# Patient Record
Sex: Female | Born: 1955 | Race: White | Hispanic: No | Marital: Married | Smoking: Former smoker
Health system: Southern US, Community
[De-identification: ages and names within clinical notes are randomized; demographics above are authoritative.]

## PROBLEM LIST (undated history)

## (undated) DIAGNOSIS — K219 Gastro-esophageal reflux disease without esophagitis: Secondary | ICD-10-CM

## (undated) DIAGNOSIS — E785 Hyperlipidemia, unspecified: Secondary | ICD-10-CM

## (undated) DIAGNOSIS — M1990 Unspecified osteoarthritis, unspecified site: Secondary | ICD-10-CM

## (undated) DIAGNOSIS — I444 Left anterior fascicular block: Secondary | ICD-10-CM

## (undated) HISTORY — PX: CATARACT EXTRACTION, BILATERAL: SHX1313

## (undated) HISTORY — PX: KNEE SURGERY: SHX244

## (undated) HISTORY — PX: ABDOMINAL HYSTERECTOMY: SHX81

## (undated) HISTORY — DX: Hyperlipidemia, unspecified: E78.5

## (undated) HISTORY — DX: Unspecified osteoarthritis, unspecified site: M19.90

---

## 2012-03-15 HISTORY — PX: COLONOSCOPY WITH ESOPHAGOGASTRODUODENOSCOPY (EGD): SHX5779

## 2013-10-04 ENCOUNTER — Other Ambulatory Visit: Payer: Self-pay | Admitting: Obstetrics & Gynecology

## 2013-10-04 DIAGNOSIS — R928 Other abnormal and inconclusive findings on diagnostic imaging of breast: Secondary | ICD-10-CM

## 2013-10-11 ENCOUNTER — Ambulatory Visit
Admission: RE | Admit: 2013-10-11 | Discharge: 2013-10-11 | Disposition: A | Discharge status: Home / Self Care | Payer: 59 | Source: Ambulatory Visit | Attending: Obstetrics & Gynecology | Admitting: Obstetrics & Gynecology

## 2013-10-11 ENCOUNTER — Encounter (INDEPENDENT_AMBULATORY_CARE_PROVIDER_SITE_OTHER): Payer: Self-pay

## 2013-10-11 DIAGNOSIS — R928 Other abnormal and inconclusive findings on diagnostic imaging of breast: Secondary | ICD-10-CM

## 2014-04-08 ENCOUNTER — Other Ambulatory Visit: Payer: Self-pay | Admitting: Obstetrics & Gynecology

## 2014-04-08 DIAGNOSIS — N6489 Other specified disorders of breast: Secondary | ICD-10-CM

## 2014-04-18 ENCOUNTER — Ambulatory Visit
Admission: RE | Admit: 2014-04-18 | Discharge: 2014-04-18 | Disposition: A | Discharge status: Home / Self Care | Payer: 59 | Source: Ambulatory Visit | Attending: Obstetrics & Gynecology | Admitting: Obstetrics & Gynecology

## 2014-04-18 ENCOUNTER — Other Ambulatory Visit: Payer: Self-pay | Admitting: Obstetrics & Gynecology

## 2014-04-18 DIAGNOSIS — N6489 Other specified disorders of breast: Secondary | ICD-10-CM

## 2014-11-14 ENCOUNTER — Other Ambulatory Visit: Payer: Self-pay | Admitting: Obstetrics & Gynecology

## 2014-11-14 ENCOUNTER — Other Ambulatory Visit: Payer: Self-pay | Admitting: Gastroenterology

## 2014-11-14 DIAGNOSIS — D242 Benign neoplasm of left breast: Secondary | ICD-10-CM

## 2014-11-21 ENCOUNTER — Other Ambulatory Visit: Payer: 59

## 2014-11-22 ENCOUNTER — Ambulatory Visit
Admission: RE | Admit: 2014-11-22 | Discharge: 2014-11-22 | Disposition: A | Discharge status: Home / Self Care | Payer: 59 | Source: Ambulatory Visit | Attending: Obstetrics & Gynecology | Admitting: Obstetrics & Gynecology

## 2014-11-22 ENCOUNTER — Other Ambulatory Visit: Payer: 59

## 2014-11-22 DIAGNOSIS — D242 Benign neoplasm of left breast: Secondary | ICD-10-CM

## 2015-12-19 ENCOUNTER — Encounter: Payer: Self-pay | Admitting: Gastroenterology

## 2015-12-24 ENCOUNTER — Other Ambulatory Visit (HOSPITAL_COMMUNITY): Payer: Self-pay | Admitting: Internal Medicine

## 2015-12-24 ENCOUNTER — Other Ambulatory Visit: Payer: Self-pay | Admitting: Obstetrics & Gynecology

## 2015-12-24 DIAGNOSIS — Z780 Asymptomatic menopausal state: Secondary | ICD-10-CM

## 2015-12-24 DIAGNOSIS — N6320 Unspecified lump in the left breast, unspecified quadrant: Secondary | ICD-10-CM

## 2015-12-31 ENCOUNTER — Ambulatory Visit
Admission: RE | Admit: 2015-12-31 | Discharge: 2015-12-31 | Disposition: A | Discharge status: Home / Self Care | Payer: 59 | Source: Ambulatory Visit | Attending: Obstetrics & Gynecology | Admitting: Obstetrics & Gynecology

## 2015-12-31 DIAGNOSIS — N6320 Unspecified lump in the left breast, unspecified quadrant: Secondary | ICD-10-CM

## 2016-01-02 ENCOUNTER — Encounter: Payer: Self-pay | Admitting: Gastroenterology

## 2016-01-05 ENCOUNTER — Ambulatory Visit (HOSPITAL_COMMUNITY)
Admission: RE | Admit: 2016-01-05 | Discharge: 2016-01-05 | Disposition: A | Discharge status: Home / Self Care | Payer: 59 | Source: Ambulatory Visit | Attending: Internal Medicine | Admitting: Internal Medicine

## 2016-01-05 DIAGNOSIS — Z780 Asymptomatic menopausal state: Secondary | ICD-10-CM | POA: Diagnosis not present

## 2016-01-05 DIAGNOSIS — F17200 Nicotine dependence, unspecified, uncomplicated: Secondary | ICD-10-CM | POA: Insufficient documentation

## 2016-01-09 ENCOUNTER — Telehealth: Payer: Self-pay | Admitting: Gastroenterology

## 2016-01-09 ENCOUNTER — Encounter: Payer: Self-pay | Admitting: Gastroenterology

## 2016-01-09 ENCOUNTER — Ambulatory Visit: Payer: 59 | Admitting: Gastroenterology

## 2016-01-09 NOTE — Telephone Encounter (Signed)
PT WAS A NO SHOW AND LETTER SENT  °

## 2016-01-12 ENCOUNTER — Ambulatory Visit: Payer: 59 | Admitting: Nurse Practitioner

## 2016-01-27 ENCOUNTER — Encounter: Payer: Self-pay | Admitting: Nurse Practitioner

## 2016-01-27 ENCOUNTER — Ambulatory Visit (INDEPENDENT_AMBULATORY_CARE_PROVIDER_SITE_OTHER): Payer: 59 | Admitting: Nurse Practitioner

## 2016-01-27 DIAGNOSIS — K5900 Constipation, unspecified: Secondary | ICD-10-CM | POA: Insufficient documentation

## 2016-01-27 DIAGNOSIS — Z86010 Personal history of colonic polyps: Secondary | ICD-10-CM

## 2016-01-27 NOTE — Assessment & Plan Note (Signed)
The patient has a history of "looped colon." Last colonoscopy 3 years ago with recommended 3 year repeat due to 2 tubular adenomas and predisposition to constipation related to looped colon. At this point she is generally asymptomatic from a GI standpoint. Has a regimen of Metamucil twice a day, milk of magnesia once a week. Also takes an over-the-counter vegetarian supplement for fiber. This point we will proceed with recommended surveillance colonoscopy.  Proceed with colonoscopy with Dr. Oneida Alar in the near future. The risks, benefits, and alternatives have been discussed in detail with the patient. They state understanding and desire to proceed.   The patient is not on any anticoagulants, anxiolytics, chronic pain medications, or antidepressants. Once a week she'll have 2-3 alcoholic drinks. She admits daily marijuana use. We will add 12.5 mg preprocedure Phenergan to promote adequate sedation.

## 2016-01-27 NOTE — Progress Notes (Signed)
CC'D TO PCP °

## 2016-01-27 NOTE — Assessment & Plan Note (Signed)
The patient's last colonoscopy found to tubular adenomas. Recommended 3 year repeat. Generally asymptomatic from a GI standpoint other than some constipation as noted below. We will proceed with recommended colonoscopy as noted below.

## 2016-01-27 NOTE — Progress Notes (Signed)
Primary Care Physician:  Wende Neighbors, MD Primary Gastroenterologist:  Dr. Oneida Alar  Chief Complaint  Patient presents with  . Colonoscopy    HPI:   Paula Sutton is a 60 y.o. female who presents on referral from primary care to establish care with a local gastroenterologist. She has a history of loop: And requires a strict regimen of stool softeners to keep bowels regular. PCP notes reviewed. Previous specialist notes reviewed from [place]. Last salt gastroenterology 01/18/2013 which notes follow-up after EGD and colonoscopy on 01/01/2013 noted to have distal esophagitis, 2 colon polyps, mild diverticulosis, internal hemorrhoids, severe looping and congested sigmoid mucosa. Biopsies suggestive of reflux esophagitis negative for Barrett's, mild gastritis negative for H. pylori, normal duodenal biopsies, 2 tubular adenomas and focal acute nonspecific colitis. Recommended repeat colonoscopy in 3 years.  Today she states she's had looping colon likely since abdominal surgery for hysterectomy with a cysts noted around her colon (this has been 15 years ago). Metamucil bid and Pepcid prn controls her symptoms well. She has also decreased her caffeine to one glass of tea a day. Has occasional epigastric abdominal pain which tends to resolve with Pepcid. Occasional/rare lower abdominal pain which improve with Milk of Mag to "flush out." Takes a supplement/vegitarian pill to help soften stools. Denies N/V, hematochezia, melena, unintentional weight loss, fever, chills. Denies chest pain, dyspnea, dizziness, lightheadedness, syncope, near syncope. Denies any other upper or lower GI symptoms.  Past Medical History:  Diagnosis Date  . Arthritis    knee    Past Surgical History:  Procedure Laterality Date  . ABDOMINAL HYSTERECTOMY    . COLONOSCOPY     3 years ago  . KNEE SURGERY Right     Current Outpatient Prescriptions  Medication Sig Dispense Refill  . famotidine (PEPCID) 40 MG tablet Take 40 mg  by mouth daily as needed for heartburn or indigestion.    . magnesium hydroxide (MILK OF MAGNESIA) 400 MG/5ML suspension Take 30 mLs by mouth once a week.    . Psyllium (METAMUCIL FIBER PO) Take 5 mLs by mouth 2 (two) times daily.    Marland Kitchen estradiol (CLIMARA - DOSED IN MG/24 HR) 0.1 mg/24hr patch      No current facility-administered medications for this visit.     Allergies as of 01/27/2016 - Review Complete 01/27/2016  Allergen Reaction Noted  . Sulfa antibiotics  01/27/2016    Family History  Problem Relation Age of Onset  . Colon cancer Neg Hx     Social History   Social History  . Marital status: Married    Spouse name: N/A  . Number of children: N/A  . Years of education: N/A   Occupational History  . Not on file.   Social History Main Topics  . Smoking status: Former Smoker    Types: Cigarettes    Quit date: 01/27/1971  . Smokeless tobacco: Never Used  . Alcohol use Yes     Comment: Occasional: once per week, 2-3 per sitting  . Drug use:     Frequency: 7.0 times per week    Types: Marijuana  . Sexual activity: Not on file   Other Topics Concern  . Not on file   Social History Narrative  . No narrative on file    Review of Systems: Complete ROS negative except as per HPI.    Physical Exam: BP 133/77   Pulse 61   Temp 97.6 F (36.4 C) (Oral)   Ht 5\' 3"  (1.6 m)  Wt 176 lb (79.8 kg)   BMI 31.18 kg/m  General:   Alert and oriented. Pleasant and cooperative. Well-nourished and well-developed.  Head:  Normocephalic and atraumatic. Eyes:  Without icterus, sclera clear and conjunctiva pink.  Ears:  Normal auditory acuity. Cardiovascular:  S1, S2 present without murmurs appreciated. Extremities without clubbing or edema. Respiratory:  Clear to auscultation bilaterally. No wheezes, rales, or rhonchi. No distress.  Gastrointestinal:  +BS, rounded but soft, non-tender and non-distended. No HSM noted. No guarding or rebound. No masses appreciated.  Rectal:   Deferred  Musculoskalatal:  Symmetrical without gross deformities. Knee brace noted right knee. Neurologic:  Alert and oriented x4;  grossly normal neurologically. Psych:  Alert and cooperative. Normal mood and affect. Heme/Lymph/Immune: No excessive bruising noted.    01/27/2016 8:37 AM   Disclaimer: This note was dictated with voice recognition software. Similar sounding words can inadvertently be transcribed and may not be corrected upon review.

## 2016-01-27 NOTE — Patient Instructions (Signed)
1. We will schedule a tentative date for your procedure. 2. We will have you return to the office within 30 days of your procedure to update you information and finalize scheduling. 3. Call with any worsening symptoms or problems.

## 2016-03-02 ENCOUNTER — Ambulatory Visit: Payer: 59 | Admitting: Nurse Practitioner

## 2016-03-12 ENCOUNTER — Encounter: Payer: Self-pay | Admitting: Gastroenterology

## 2016-04-30 DIAGNOSIS — M546 Pain in thoracic spine: Secondary | ICD-10-CM | POA: Diagnosis not present

## 2016-04-30 DIAGNOSIS — S134XXA Sprain of ligaments of cervical spine, initial encounter: Secondary | ICD-10-CM | POA: Diagnosis not present

## 2016-04-30 DIAGNOSIS — M531 Cervicobrachial syndrome: Secondary | ICD-10-CM | POA: Diagnosis not present

## 2016-05-26 DIAGNOSIS — M546 Pain in thoracic spine: Secondary | ICD-10-CM | POA: Diagnosis not present

## 2016-05-26 DIAGNOSIS — S134XXA Sprain of ligaments of cervical spine, initial encounter: Secondary | ICD-10-CM | POA: Diagnosis not present

## 2016-05-26 DIAGNOSIS — M531 Cervicobrachial syndrome: Secondary | ICD-10-CM | POA: Diagnosis not present

## 2016-07-02 DIAGNOSIS — H5310 Unspecified subjective visual disturbances: Secondary | ICD-10-CM | POA: Diagnosis not present

## 2016-07-05 ENCOUNTER — Ambulatory Visit (HOSPITAL_COMMUNITY)
Admission: RE | Admit: 2016-07-05 | Discharge: 2016-07-05 | Disposition: A | Discharge status: Home / Self Care | Payer: 59 | Source: Ambulatory Visit | Attending: Internal Medicine | Admitting: Internal Medicine

## 2016-07-05 ENCOUNTER — Other Ambulatory Visit (HOSPITAL_COMMUNITY): Payer: Self-pay | Admitting: Internal Medicine

## 2016-07-05 DIAGNOSIS — M79602 Pain in left arm: Secondary | ICD-10-CM

## 2016-07-05 DIAGNOSIS — R002 Palpitations: Secondary | ICD-10-CM

## 2016-07-05 DIAGNOSIS — R42 Dizziness and giddiness: Secondary | ICD-10-CM | POA: Diagnosis not present

## 2016-07-06 ENCOUNTER — Other Ambulatory Visit (HOSPITAL_COMMUNITY): Payer: Self-pay | Admitting: Internal Medicine

## 2016-07-06 DIAGNOSIS — M79602 Pain in left arm: Secondary | ICD-10-CM

## 2016-07-06 DIAGNOSIS — R002 Palpitations: Secondary | ICD-10-CM

## 2016-07-22 DIAGNOSIS — S134XXA Sprain of ligaments of cervical spine, initial encounter: Secondary | ICD-10-CM | POA: Diagnosis not present

## 2016-07-22 DIAGNOSIS — M531 Cervicobrachial syndrome: Secondary | ICD-10-CM | POA: Diagnosis not present

## 2016-07-22 DIAGNOSIS — M546 Pain in thoracic spine: Secondary | ICD-10-CM | POA: Diagnosis not present

## 2016-07-23 DIAGNOSIS — M25562 Pain in left knee: Secondary | ICD-10-CM | POA: Diagnosis not present

## 2016-09-08 DIAGNOSIS — M546 Pain in thoracic spine: Secondary | ICD-10-CM | POA: Diagnosis not present

## 2016-09-08 DIAGNOSIS — M531 Cervicobrachial syndrome: Secondary | ICD-10-CM | POA: Diagnosis not present

## 2016-09-08 DIAGNOSIS — S134XXA Sprain of ligaments of cervical spine, initial encounter: Secondary | ICD-10-CM | POA: Diagnosis not present

## 2016-12-01 DIAGNOSIS — M531 Cervicobrachial syndrome: Secondary | ICD-10-CM | POA: Diagnosis not present

## 2016-12-01 DIAGNOSIS — M546 Pain in thoracic spine: Secondary | ICD-10-CM | POA: Diagnosis not present

## 2016-12-01 DIAGNOSIS — S134XXA Sprain of ligaments of cervical spine, initial encounter: Secondary | ICD-10-CM | POA: Diagnosis not present

## 2016-12-09 ENCOUNTER — Other Ambulatory Visit: Payer: Self-pay | Admitting: Obstetrics & Gynecology

## 2016-12-09 DIAGNOSIS — Z1231 Encounter for screening mammogram for malignant neoplasm of breast: Secondary | ICD-10-CM

## 2016-12-27 DIAGNOSIS — E782 Mixed hyperlipidemia: Secondary | ICD-10-CM | POA: Diagnosis not present

## 2016-12-29 DIAGNOSIS — Z0000 Encounter for general adult medical examination without abnormal findings: Secondary | ICD-10-CM | POA: Diagnosis not present

## 2016-12-30 DIAGNOSIS — M546 Pain in thoracic spine: Secondary | ICD-10-CM | POA: Diagnosis not present

## 2016-12-30 DIAGNOSIS — M531 Cervicobrachial syndrome: Secondary | ICD-10-CM | POA: Diagnosis not present

## 2016-12-30 DIAGNOSIS — S134XXA Sprain of ligaments of cervical spine, initial encounter: Secondary | ICD-10-CM | POA: Diagnosis not present

## 2016-12-31 ENCOUNTER — Ambulatory Visit
Admission: RE | Admit: 2016-12-31 | Discharge: 2016-12-31 | Disposition: A | Discharge status: Home / Self Care | Payer: 59 | Source: Ambulatory Visit | Attending: Obstetrics & Gynecology | Admitting: Obstetrics & Gynecology

## 2016-12-31 DIAGNOSIS — Z1231 Encounter for screening mammogram for malignant neoplasm of breast: Secondary | ICD-10-CM | POA: Diagnosis not present

## 2017-01-03 ENCOUNTER — Other Ambulatory Visit: Payer: Self-pay | Admitting: Obstetrics & Gynecology

## 2017-01-03 DIAGNOSIS — R928 Other abnormal and inconclusive findings on diagnostic imaging of breast: Secondary | ICD-10-CM

## 2017-01-31 DIAGNOSIS — M531 Cervicobrachial syndrome: Secondary | ICD-10-CM | POA: Diagnosis not present

## 2017-01-31 DIAGNOSIS — S134XXA Sprain of ligaments of cervical spine, initial encounter: Secondary | ICD-10-CM | POA: Diagnosis not present

## 2017-01-31 DIAGNOSIS — M546 Pain in thoracic spine: Secondary | ICD-10-CM | POA: Diagnosis not present

## 2017-02-01 ENCOUNTER — Ambulatory Visit
Admission: RE | Admit: 2017-02-01 | Discharge: 2017-02-01 | Disposition: A | Discharge status: Home / Self Care | Payer: 59 | Source: Ambulatory Visit | Attending: Obstetrics & Gynecology | Admitting: Obstetrics & Gynecology

## 2017-02-01 DIAGNOSIS — R928 Other abnormal and inconclusive findings on diagnostic imaging of breast: Secondary | ICD-10-CM

## 2017-02-01 DIAGNOSIS — N6489 Other specified disorders of breast: Secondary | ICD-10-CM | POA: Diagnosis not present

## 2017-02-01 DIAGNOSIS — R922 Inconclusive mammogram: Secondary | ICD-10-CM | POA: Diagnosis not present

## 2017-02-07 ENCOUNTER — Other Ambulatory Visit: Payer: 59

## 2017-02-24 DIAGNOSIS — S134XXA Sprain of ligaments of cervical spine, initial encounter: Secondary | ICD-10-CM | POA: Diagnosis not present

## 2017-02-24 DIAGNOSIS — M531 Cervicobrachial syndrome: Secondary | ICD-10-CM | POA: Diagnosis not present

## 2017-02-24 DIAGNOSIS — M546 Pain in thoracic spine: Secondary | ICD-10-CM | POA: Diagnosis not present

## 2017-02-25 DIAGNOSIS — S134XXA Sprain of ligaments of cervical spine, initial encounter: Secondary | ICD-10-CM | POA: Diagnosis not present

## 2017-02-25 DIAGNOSIS — M531 Cervicobrachial syndrome: Secondary | ICD-10-CM | POA: Diagnosis not present

## 2017-02-25 DIAGNOSIS — M546 Pain in thoracic spine: Secondary | ICD-10-CM | POA: Diagnosis not present

## 2017-03-03 DIAGNOSIS — M546 Pain in thoracic spine: Secondary | ICD-10-CM | POA: Diagnosis not present

## 2017-03-03 DIAGNOSIS — S134XXA Sprain of ligaments of cervical spine, initial encounter: Secondary | ICD-10-CM | POA: Diagnosis not present

## 2017-03-03 DIAGNOSIS — M531 Cervicobrachial syndrome: Secondary | ICD-10-CM | POA: Diagnosis not present

## 2017-03-10 DIAGNOSIS — M531 Cervicobrachial syndrome: Secondary | ICD-10-CM | POA: Diagnosis not present

## 2017-03-10 DIAGNOSIS — M546 Pain in thoracic spine: Secondary | ICD-10-CM | POA: Diagnosis not present

## 2017-03-10 DIAGNOSIS — S134XXA Sprain of ligaments of cervical spine, initial encounter: Secondary | ICD-10-CM | POA: Diagnosis not present

## 2017-03-17 DIAGNOSIS — M546 Pain in thoracic spine: Secondary | ICD-10-CM | POA: Diagnosis not present

## 2017-03-17 DIAGNOSIS — S134XXA Sprain of ligaments of cervical spine, initial encounter: Secondary | ICD-10-CM | POA: Diagnosis not present

## 2017-03-17 DIAGNOSIS — M531 Cervicobrachial syndrome: Secondary | ICD-10-CM | POA: Diagnosis not present

## 2017-03-23 DIAGNOSIS — H5201 Hypermetropia, right eye: Secondary | ICD-10-CM | POA: Diagnosis not present

## 2017-03-23 DIAGNOSIS — H2511 Age-related nuclear cataract, right eye: Secondary | ICD-10-CM | POA: Diagnosis not present

## 2017-03-23 DIAGNOSIS — Z961 Presence of intraocular lens: Secondary | ICD-10-CM | POA: Diagnosis not present

## 2017-03-23 DIAGNOSIS — H52203 Unspecified astigmatism, bilateral: Secondary | ICD-10-CM | POA: Diagnosis not present

## 2017-03-30 DIAGNOSIS — M546 Pain in thoracic spine: Secondary | ICD-10-CM | POA: Diagnosis not present

## 2017-03-30 DIAGNOSIS — S134XXA Sprain of ligaments of cervical spine, initial encounter: Secondary | ICD-10-CM | POA: Diagnosis not present

## 2017-03-30 DIAGNOSIS — M531 Cervicobrachial syndrome: Secondary | ICD-10-CM | POA: Diagnosis not present

## 2017-04-14 DIAGNOSIS — S134XXA Sprain of ligaments of cervical spine, initial encounter: Secondary | ICD-10-CM | POA: Diagnosis not present

## 2017-04-14 DIAGNOSIS — M531 Cervicobrachial syndrome: Secondary | ICD-10-CM | POA: Diagnosis not present

## 2017-04-14 DIAGNOSIS — M546 Pain in thoracic spine: Secondary | ICD-10-CM | POA: Diagnosis not present

## 2017-04-27 ENCOUNTER — Ambulatory Visit: Payer: 59 | Admitting: Nurse Practitioner

## 2017-05-16 DIAGNOSIS — S134XXA Sprain of ligaments of cervical spine, initial encounter: Secondary | ICD-10-CM | POA: Diagnosis not present

## 2017-05-16 DIAGNOSIS — M531 Cervicobrachial syndrome: Secondary | ICD-10-CM | POA: Diagnosis not present

## 2017-05-16 DIAGNOSIS — M546 Pain in thoracic spine: Secondary | ICD-10-CM | POA: Diagnosis not present

## 2017-05-25 DIAGNOSIS — M25562 Pain in left knee: Secondary | ICD-10-CM | POA: Diagnosis not present

## 2017-05-25 DIAGNOSIS — Z96651 Presence of right artificial knee joint: Secondary | ICD-10-CM | POA: Diagnosis not present

## 2017-06-03 ENCOUNTER — Other Ambulatory Visit: Payer: Self-pay

## 2017-06-03 ENCOUNTER — Encounter: Payer: Self-pay | Admitting: Gastroenterology

## 2017-06-03 ENCOUNTER — Ambulatory Visit (INDEPENDENT_AMBULATORY_CARE_PROVIDER_SITE_OTHER): Payer: 59 | Admitting: Gastroenterology

## 2017-06-03 VITALS — BP 119/79 | HR 52 | Temp 96.5°F | Ht 62.0 in | Wt 176.8 lb

## 2017-06-03 DIAGNOSIS — Z86010 Personal history of colonic polyps: Secondary | ICD-10-CM

## 2017-06-03 DIAGNOSIS — R1013 Epigastric pain: Secondary | ICD-10-CM | POA: Diagnosis not present

## 2017-06-03 DIAGNOSIS — K5900 Constipation, unspecified: Secondary | ICD-10-CM | POA: Diagnosis not present

## 2017-06-03 DIAGNOSIS — Z860101 Personal history of adenomatous and serrated colon polyps: Secondary | ICD-10-CM

## 2017-06-03 MED ORDER — NA SULFATE-K SULFATE-MG SULF 17.5-3.13-1.6 GM/177ML PO SOLN: 1.0000 | ORAL | 0 refills | Status: DC

## 2017-06-03 MED ORDER — LINACLOTIDE 145 MCG PO CAPS: 145.0000 ug | ORAL_CAPSULE | Freq: Every day | ORAL | 5 refills | Status: DC

## 2017-06-03 NOTE — Assessment & Plan Note (Signed)
Patient also has a history of reflux esophagitis.  Currently treating typical heartburn symptoms adequately with digestive enzymes, peppermint oil.  She complains of epigastric pain, tender on exam.  This is been chronic in nature, does not seem to be improving.  Is requesting upper endoscopy at time of colonoscopy which was read.  Plan for deep sedation with propofol.  I have discussed the risks, alternatives, benefits with regards to but not limited to the risk of reaction to medication, bleeding, infection, perforation and the patient is agreeable to proceed. Written consent to be obtained.

## 2017-06-03 NOTE — Progress Notes (Addendum)
Primary Care Physician:  Celene Squibb, MD  Primary Gastroenterologist:  Barney Drain, MD REVIEWED-NO ADDITIONAL RECOMMENDATIONS.  Chief Complaint  Patient presents with  . Colonoscopy    consult, past due    HPI:  Paula Sutton is a 62 y.o. female here to rescheduled her colonoscopy and discuss possible upper endoscopy.  She was seen back in 2017 and scheduled for colonoscopy but patient had to cancel.  EGD and colonoscopy at [facility] on 01/01/2013 noted to have distal esophagitis, 2 colon polyps, mild diverticulosis, internal hemorrhoids, severe looping and congested sigmoid mucosa. Biopsies suggestive of reflux esophagitis negative for Barrett's, mild gastritis negative for H. pylori, normal duodenal biopsies, 2 tubular adenomas and focal acute nonspecific colitis. Recommended repeat colonoscopy in 3 years.  She is no longer on PPI.  She states she takes digestive enzymes, peppermint which seems to help with her reflux and gas.  She continues to have some epigastric discomfort especially with palpation but also related to meals.  Her chiropractor told her she had a hiatal hernia.  He has been making adjustments.  She has to stay on top of her bowel regimen to continue to have regular BMs.  She takes MiraLAX every day and milk of magnesia a couple of times per week.  Denies melena or rectal bleeding.  She basically has a bowel movement every day, some days more productive than others.  She denies dysphagia.  No vomiting.  No unintentional weight loss.   Current Outpatient Medications  Medication Sig Dispense Refill  . DIGESTIVE ENZYMES PO Take by mouth daily.    Marland Kitchen estradiol (CLIMARA - DOSED IN MG/24 HR) 0.1 mg/24hr patch     . magnesium hydroxide (MILK OF MAGNESIA) 400 MG/5ML suspension Take 30 mLs by mouth once a week.    . polyethylene glycol (MIRALAX / GLYCOLAX) packet Take 17 g by mouth daily.     No current facility-administered medications for this visit.     Allergies  as of 06/03/2017 - Review Complete 06/03/2017  Allergen Reaction Noted  . Sulfa antibiotics  01/27/2016    Past Medical History:  Diagnosis Date  . Arthritis    knee    Past Surgical History:  Procedure Laterality Date  . ABDOMINAL HYSTERECTOMY    . COLONOSCOPY WITH ESOPHAGOGASTRODUODENOSCOPY (EGD)  2014   [place] Right     Family History  Problem Relation Age of Onset  . Colon cancer Neg Hx   . Breast cancer Neg Hx     Social History   Socioeconomic History  . Marital status: Married    Spouse name: Not on file  . Number of children: Not on file  . Years of education: Not on file  . Highest education level: Not on file  Occupational History  . Not on file  Social Needs  . Financial resource strain: Not on file  . Food insecurity:    Worry: Not on file    Inability: Not on file  . Transportation needs:    Medical: Not on file    Non-medical: Not on file  Tobacco Use  . Smoking status: Former Smoker    Types: Cigarettes    Last attempt to quit: 01/27/1971    Years since quitting: 46.3  . Smokeless tobacco: Never Used  Substance and Sexual Activity  . Alcohol use: Yes    Comment: Occasional: 1-2 times/month  . Drug use: Yes    Frequency: 7.0 times per week  Types: Marijuana  . Sexual activity: Not on file  Lifestyle  . Physical activity:    Days per week: Not on file    Minutes per session: Not on file  . Stress: Not on file  Relationships  . Social connections:    Talks on phone: Not on file    Gets together: Not on file    Attends religious service: Not on file    Active member of club or organization: Not on file    Attends meetings of clubs or organizations: Not on file    Relationship status: Not on file  . Intimate partner violence:    Fear of current or ex partner: Not on file    Emotionally abused: Not on file    Physically abused: Not on file    Forced sexual activity: Not on file  Other Topics Concern   . Not on file  Social History Narrative  . Not on file      ROS:  General: Negative for anorexia, weight loss, fever, chills, fatigue, weakness. Eyes: Negative for vision changes.  ENT: Negative for hoarseness, difficulty swallowing , nasal congestion. CV: Negative for chest pain, angina, palpitations, dyspnea on exertion, peripheral edema.  Respiratory: Negative for dyspnea at rest, dyspnea on exertion, cough, sputum, wheezing.  GI: See history of present illness. GU:  Negative for dysuria, hematuria, urinary incontinence, urinary frequency, nocturnal urination.  MS: Negative for joint pain, low back pain.  Derm: Negative for rash or itching.  Neuro: Negative for weakness, abnormal sensation, seizure, frequent headaches, memory loss, confusion.  Psych: Negative for anxiety, depression, suicidal ideation, hallucinations.  Endo: Negative for unusual weight change.  Heme: Negative for bruising or bleeding. Allergy: Negative for rash or hives.    Physical Examination:  BP 119/79   Pulse (!) 52   Temp (!) 96.5 F (35.8 C) (Oral)   Ht 5\' 2"  (1.575 m)   Wt 176 lb 12.8 oz (80.2 kg)   BMI 32.34 kg/m    General: Well-nourished, well-developed in no acute distress.  Head: Normocephalic, atraumatic.   Eyes: Conjunctiva pink, no icterus. Mouth: Oropharyngeal mucosa moist and pink , no lesions erythema or exudate. Neck: Supple without thyromegaly, masses, or lymphadenopathy.  Lungs: Clear to auscultation bilaterally.  Heart: Regular rate and rhythm, no murmurs rubs or gallops.  Abdomen: Bowel sounds are normal, nontender, nondistended, no hepatosplenomegaly or masses, no abdominal bruits or    hernia , no rebound or guarding.   Rectal: not performed Extremities: No lower extremity edema. No clubbing or deformities.  Neuro: Alert and oriented x 4 , grossly normal neurologically.  Skin: Warm and dry, no rash or jaundice.   Psych: Alert and cooperative, normal mood and affect.    Imaging Studies: No results found.

## 2017-06-03 NOTE — Assessment & Plan Note (Signed)
Patient presents to reschedule her colonoscopy for history of tubular adenomas.  She was due in 2017.  According to the records she has a difficult to examine colon with looping.  Day-to-day she seems to manage her constipation pretty good however given her last colonoscopy, her prep was somewhat suboptimal, will switch her over to Linzess 115mcg daily to improve bowel function especially prior to colonoscopy. Plan for deep sedation with propofol.  I have discussed the risks, alternatives, benefits with regards to but not limited to the risk of reaction to medication, bleeding, infection, perforation and the patient is agreeable to proceed. Written consent to be obtained.

## 2017-06-03 NOTE — Patient Instructions (Signed)
1. Colonoscopy and upper endoscopy as scheduled. See separate instructions.  2. I have sent in RX for Linzess and provided samples today. You can take once daily on empty stomach for constipation. Please make sure your bowels are moving well especially the week before your procedures.

## 2017-06-06 ENCOUNTER — Telehealth: Payer: Self-pay

## 2017-06-06 NOTE — Progress Notes (Signed)
cc'ed to pcp °

## 2017-06-06 NOTE — Telephone Encounter (Signed)
PA info for TCS/EGD submitted via [facility] website. Notification/prior authorization reference number is [id number].

## 2017-06-06 NOTE — Telephone Encounter (Signed)
Called and informed pt of pre-op appt 08/04/17 at 9:00am. Letter mailed.

## 2017-06-08 NOTE — Telephone Encounter (Signed)
TCS/EGD approved.

## 2017-06-30 DIAGNOSIS — S134XXA Sprain of ligaments of cervical spine, initial encounter: Secondary | ICD-10-CM | POA: Diagnosis not present

## 2017-06-30 DIAGNOSIS — M546 Pain in thoracic spine: Secondary | ICD-10-CM | POA: Diagnosis not present

## 2017-06-30 DIAGNOSIS — M25562 Pain in left knee: Secondary | ICD-10-CM | POA: Diagnosis not present

## 2017-06-30 DIAGNOSIS — M25561 Pain in right knee: Secondary | ICD-10-CM | POA: Diagnosis not present

## 2017-06-30 DIAGNOSIS — M1712 Unilateral primary osteoarthritis, left knee: Secondary | ICD-10-CM | POA: Diagnosis not present

## 2017-06-30 DIAGNOSIS — M531 Cervicobrachial syndrome: Secondary | ICD-10-CM | POA: Diagnosis not present

## 2017-07-01 DIAGNOSIS — E782 Mixed hyperlipidemia: Secondary | ICD-10-CM | POA: Diagnosis not present

## 2017-07-11 DIAGNOSIS — K5900 Constipation, unspecified: Secondary | ICD-10-CM | POA: Diagnosis not present

## 2017-07-11 DIAGNOSIS — N951 Menopausal and female climacteric states: Secondary | ICD-10-CM | POA: Diagnosis not present

## 2017-07-11 DIAGNOSIS — E782 Mixed hyperlipidemia: Secondary | ICD-10-CM | POA: Diagnosis not present

## 2017-07-28 DIAGNOSIS — S134XXA Sprain of ligaments of cervical spine, initial encounter: Secondary | ICD-10-CM | POA: Diagnosis not present

## 2017-07-28 DIAGNOSIS — M531 Cervicobrachial syndrome: Secondary | ICD-10-CM | POA: Diagnosis not present

## 2017-07-28 DIAGNOSIS — M546 Pain in thoracic spine: Secondary | ICD-10-CM | POA: Diagnosis not present

## 2017-08-02 ENCOUNTER — Other Ambulatory Visit (HOSPITAL_COMMUNITY): Payer: 59

## 2017-08-02 NOTE — Patient Instructions (Signed)
Your procedure is scheduled on: 08/09/2017  Report to [facility] at 8:00    AM.  Call this number if you have problems the morning of surgery: [phone]   Remember:              Follow Directions on the letter you received from Your Physician's office regarding the Bowel Prep  :  Take these medicines the morning of surgery with A SIP OF WATER: Claritin and Pecid if needed   Do not wear jewelry, make-up or nail polish.    Do not bring valuables to the hospital.  Contacts, dentures or bridgework may not be worn into surgery.  .   Patients discharged the day of surgery will not be allowed to drive home.     Colonoscopy, Adult, Care After This sheet gives you information about how to care for yourself after your procedure. Your health care provider may also give you more specific instructions. If you have problems or questions, contact your health care provider. What can I expect after the procedure? After the procedure, it is common to have:  A small amount of blood in your stool for 24 hours after the procedure.  Some gas.  Mild abdominal cramping or bloating.  Follow these instructions at home: General instructions   For the first 24 hours after the procedure: ? Do not drive or use machinery. ? Do not sign important documents. ? Do not drink alcohol. ? Do your regular daily activities at a slower pace than normal. ? Eat soft, easy-to-digest foods. ? Rest often.  Take over-the-counter or prescription medicines only as told by your health care provider.  It is up to you to get the results of your procedure. Ask your health care provider, or the department performing the procedure, when your results will be ready. Relieving cramping and bloating  Try walking around when you have cramps or feel bloated.  Apply heat to your abdomen as told by your health care provider. Use a heat source that your health care provider recommends, such as a moist heat pack or a heating  pad. ? Place a towel between your skin and the heat source. ? Leave the heat on for 20-30 minutes. ? Remove the heat if your skin turns bright red. This is especially important if you are unable to feel pain, heat, or cold. You may have a greater risk of getting burned. Eating and drinking  Drink enough fluid to keep your urine clear or pale yellow.  Resume your normal diet as instructed by your health care provider. Avoid heavy or fried foods that are hard to digest.  Avoid drinking alcohol for as long as instructed by your health care provider. Contact a health care provider if:  You have blood in your stool 2-3 days after the procedure. Get help right away if:  You have more than a small spotting of blood in your stool.  You pass large blood clots in your stool.  Your abdomen is swollen.  You have nausea or vomiting.  You have a fever.  You have increasing abdominal pain that is not relieved with medicine. This information is not intended to replace advice given to you by your health care provider. Make sure you discuss any questions you have with your health care provider. Document Released: 10/14/2003 Document Revised: 11/24/2015 Document Reviewed: 05/13/2015 Elsevier Interactive Patient Education  2018 [place] After Please read the instructions outlined below and refer to this sheet  in the next few weeks. These discharge instructions provide you with general information on caring for yourself after you leave the hospital. Your doctor may also give you specific instructions. While your treatment has been planned according to the most current medical practices available, unavoidable complications occasionally occur. If you have any problems or questions after discharge, please call your doctor. HOME CARE INSTRUCTIONS Activity  You may resume your regular activity but move at a slower pace for the next 24 hours.   Take frequent rest periods for the  next 24 hours.   Walking will help expel (get rid of) the air and reduce the bloated feeling in your abdomen.   No driving for 24 hours (because of the anesthesia (medicine) used during the test).   You may shower.   Do not sign any important legal documents or operate any machinery for 24 hours (because of the anesthesia used during the test).  Nutrition  Drink plenty of fluids.   You may resume your normal diet.   Begin with a light meal and progress to your normal diet.   Avoid alcoholic beverages for 24 hours or as instructed by your caregiver.  Medications You may resume your normal medications unless your caregiver tells you otherwise. What you can expect today  You may experience abdominal discomfort such as a feeling of fullness or "gas" pains.   You may experience a sore throat for 2 to 3 days. This is normal. Gargling with salt water may help this.  Follow-up Your doctor will discuss the results of your test with you. SEEK IMMEDIATE MEDICAL CARE IF:  You have excessive nausea (feeling sick to your stomach) and/or vomiting.   You have severe abdominal pain and distention (swelling).   You have trouble swallowing.   You have a temperature over 100 F (37.8 C).   You have rectal bleeding or vomiting of blood.  Document Released: 10/14/2003 Document Revised: 02/18/2011 Document Reviewed: 04/26/2007

## 2017-08-04 ENCOUNTER — Encounter (HOSPITAL_COMMUNITY)
Admission: RE | Admit: 2017-08-04 | Discharge: 2017-08-04 | Disposition: A | Discharge status: Home / Self Care | Payer: 59 | Source: Ambulatory Visit | Attending: Gastroenterology | Admitting: Gastroenterology

## 2017-08-04 ENCOUNTER — Other Ambulatory Visit: Payer: Self-pay

## 2017-08-04 ENCOUNTER — Encounter (HOSPITAL_COMMUNITY): Payer: Self-pay

## 2017-08-04 DIAGNOSIS — R001 Bradycardia, unspecified: Secondary | ICD-10-CM | POA: Insufficient documentation

## 2017-08-04 DIAGNOSIS — R9431 Abnormal electrocardiogram [ECG] [EKG]: Secondary | ICD-10-CM | POA: Diagnosis not present

## 2017-08-04 DIAGNOSIS — Z01818 Encounter for other preprocedural examination: Secondary | ICD-10-CM | POA: Insufficient documentation

## 2017-08-04 DIAGNOSIS — Z01812 Encounter for preprocedural laboratory examination: Secondary | ICD-10-CM | POA: Diagnosis not present

## 2017-08-04 HISTORY — DX: Gastro-esophageal reflux disease without esophagitis: K21.9

## 2017-08-04 LAB — BASIC METABOLIC PANEL
ANION GAP: 7 (ref 5–15)
BUN: 24 mg/dL — ABNORMAL HIGH (ref 6–20)
CHLORIDE: 102 mmol/L (ref 101–111)
CO2: 27 mmol/L (ref 22–32)
Calcium: 9.5 mg/dL (ref 8.9–10.3)
Creatinine, Ser: 1.07 mg/dL — ABNORMAL HIGH (ref 0.44–1.00)
GFR calc non Af Amer: 54 mL/min — ABNORMAL LOW (ref >60)
GLUCOSE: 97 mg/dL (ref 65–99)
Potassium: 4.6 mmol/L (ref 3.5–5.1)
Sodium: 136 mmol/L (ref 135–145)

## 2017-08-04 LAB — CBC
HCT: 44.1 % (ref 36.0–46.0)
HEMOGLOBIN: 14.3 g/dL (ref 12.0–15.0)
MCH: 29.9 pg (ref 26.0–34.0)
MCHC: 32.4 g/dL (ref 30.0–36.0)
MCV: 92.3 fL (ref 78.0–100.0)
Platelets: 273 10*3/uL (ref 150–400)
RBC: 4.78 MIL/uL (ref 3.87–5.11)
RDW: 14.1 % (ref 11.5–15.5)
WBC: 5.7 10*3/uL (ref 4.0–10.5)

## 2017-08-04 NOTE — Progress Notes (Signed)
cc'd to pcp 

## 2017-08-09 ENCOUNTER — Ambulatory Visit (HOSPITAL_COMMUNITY): Payer: 59 | Admitting: Anesthesiology

## 2017-08-09 ENCOUNTER — Ambulatory Visit (HOSPITAL_COMMUNITY)
Admission: RE | Admit: 2017-08-09 | Discharge: 2017-08-09 | Disposition: A | Discharge status: Home / Self Care | Payer: 59 | Source: Ambulatory Visit | Attending: Gastroenterology | Admitting: Gastroenterology

## 2017-08-09 ENCOUNTER — Encounter (HOSPITAL_COMMUNITY)
Admission: RE | Disposition: A | Discharge status: Home / Self Care | Payer: Self-pay | Source: Ambulatory Visit | Attending: Gastroenterology

## 2017-08-09 DIAGNOSIS — R1013 Epigastric pain: Secondary | ICD-10-CM | POA: Diagnosis not present

## 2017-08-09 DIAGNOSIS — K5900 Constipation, unspecified: Secondary | ICD-10-CM

## 2017-08-09 DIAGNOSIS — Z86010 Personal history of colonic polyps: Secondary | ICD-10-CM | POA: Diagnosis not present

## 2017-08-09 DIAGNOSIS — K219 Gastro-esophageal reflux disease without esophagitis: Secondary | ICD-10-CM | POA: Insufficient documentation

## 2017-08-09 DIAGNOSIS — K222 Esophageal obstruction: Secondary | ICD-10-CM | POA: Diagnosis not present

## 2017-08-09 DIAGNOSIS — Z87891 Personal history of nicotine dependence: Secondary | ICD-10-CM | POA: Insufficient documentation

## 2017-08-09 DIAGNOSIS — K2970 Gastritis, unspecified, without bleeding: Secondary | ICD-10-CM | POA: Diagnosis not present

## 2017-08-09 DIAGNOSIS — D123 Benign neoplasm of transverse colon: Secondary | ICD-10-CM

## 2017-08-09 DIAGNOSIS — K2980 Duodenitis without bleeding: Secondary | ICD-10-CM | POA: Diagnosis not present

## 2017-08-09 DIAGNOSIS — D128 Benign neoplasm of rectum: Secondary | ICD-10-CM

## 2017-08-09 DIAGNOSIS — K449 Diaphragmatic hernia without obstruction or gangrene: Secondary | ICD-10-CM | POA: Diagnosis not present

## 2017-08-09 DIAGNOSIS — D120 Benign neoplasm of cecum: Secondary | ICD-10-CM

## 2017-08-09 DIAGNOSIS — T39395A Adverse effect of other nonsteroidal anti-inflammatory drugs [NSAID], initial encounter: Secondary | ICD-10-CM | POA: Diagnosis not present

## 2017-08-09 DIAGNOSIS — K648 Other hemorrhoids: Secondary | ICD-10-CM | POA: Diagnosis not present

## 2017-08-09 DIAGNOSIS — K644 Residual hemorrhoidal skin tags: Secondary | ICD-10-CM | POA: Insufficient documentation

## 2017-08-09 DIAGNOSIS — Z79890 Hormone replacement therapy (postmenopausal): Secondary | ICD-10-CM | POA: Diagnosis not present

## 2017-08-09 DIAGNOSIS — K5730 Diverticulosis of large intestine without perforation or abscess without bleeding: Secondary | ICD-10-CM | POA: Diagnosis not present

## 2017-08-09 DIAGNOSIS — Z79899 Other long term (current) drug therapy: Secondary | ICD-10-CM | POA: Diagnosis not present

## 2017-08-09 DIAGNOSIS — Z1211 Encounter for screening for malignant neoplasm of colon: Secondary | ICD-10-CM | POA: Diagnosis not present

## 2017-08-09 HISTORY — PX: ESOPHAGOGASTRODUODENOSCOPY (EGD) WITH PROPOFOL: SHX5813

## 2017-08-09 HISTORY — PX: COLONOSCOPY WITH PROPOFOL: SHX5780

## 2017-08-09 HISTORY — PX: BIOPSY: SHX5522

## 2017-08-09 HISTORY — PX: POLYPECTOMY: SHX5525

## 2017-08-09 SURGERY — COLONOSCOPY WITH PROPOFOL
Anesthesia: General

## 2017-08-09 MED ORDER — HYDROCODONE-ACETAMINOPHEN 7.5-325 MG PO TABS: 1.0000 | ORAL_TABLET | Freq: Once | ORAL | Status: DC | PRN

## 2017-08-09 MED ORDER — FENTANYL CITRATE (PF) 100 MCG/2ML IJ SOLN: 25.0000 ug | INTRAMUSCULAR | Status: DC | PRN

## 2017-08-09 MED ORDER — CHLORHEXIDINE GLUCONATE CLOTH 2 % EX PADS: 6.0000 | MEDICATED_PAD | Freq: Once | CUTANEOUS | Status: DC

## 2017-08-09 MED ORDER — LANSOPRAZOLE 30 MG PO CPDR: DELAYED_RELEASE_CAPSULE | ORAL | 11 refills | Status: DC

## 2017-08-09 MED ORDER — LACTATED RINGERS IV SOLN
INTRAVENOUS | Status: DC
  Administered 2017-08-09: 08:00:00 via INTRAVENOUS

## 2017-08-09 MED ORDER — PROPOFOL 500 MG/50ML IV EMUL
INTRAVENOUS | Status: DC | PRN
  Administered 2017-08-09: 150 ug/kg/min via INTRAVENOUS
  Administered 2017-08-09 (×2): 125 ug/kg/min via INTRAVENOUS

## 2017-08-09 MED ORDER — PROPOFOL 10 MG/ML IV BOLUS
INTRAVENOUS | Status: AC
  Filled 2017-08-09: qty 80

## 2017-08-09 MED ORDER — PROPOFOL 10 MG/ML IV BOLUS
INTRAVENOUS | Status: DC | PRN
  Administered 2017-08-09 (×2): 40 mg via INTRAVENOUS
  Administered 2017-08-09 (×4): 20 mg via INTRAVENOUS

## 2017-08-09 NOTE — Anesthesia Preprocedure Evaluation (Signed)
Anesthesia Evaluation  Patient identified by MRN, date of birth, ID band Patient awake    Reviewed: Allergy & Precautions, NPO status , Patient's Chart, lab work & pertinent test results  Airway Mallampati: II  TM Distance: >3 FB Neck ROM: Full    Dental no notable dental hx.    Pulmonary neg pulmonary ROS, former smoker,  + MJ use q day - mult times /day  Denies Cigarrette use    Pulmonary exam normal breath sounds clear to auscultation       Cardiovascular negative cardio ROS Normal cardiovascular exam Rhythm:Regular Rate:Normal     Neuro/Psych negative neurological ROS  negative psych ROS   GI/Hepatic negative GI ROS, Neg liver ROS, Denies GERD Sx on pepcid    Endo/Other  negative endocrine ROS  Renal/GU negative Renal ROS  negative genitourinary   Musculoskeletal negative musculoskeletal ROS (+) Arthritis ,   Abdominal   Peds negative pediatric ROS (+)  Hematology negative hematology ROS (+)   Anesthesia Other Findings   Reproductive/Obstetrics negative OB ROS                             Anesthesia Physical Anesthesia Plan  ASA: II  Anesthesia Plan: General   Post-op Pain Management:    Induction: Intravenous  PONV Risk Score and Plan:   Airway Management Planned: Simple Face Mask and Nasal Cannula  Additional Equipment:   Intra-op Plan:   Post-operative Plan:   Informed Consent: I have reviewed the patients History and Physical, chart, labs and discussed the procedure including the risks, benefits and alternatives for the proposed anesthesia with the patient or authorized representative who has indicated his/her understanding and acceptance.   Dental advisory given  Plan Discussed with: CRNA  Anesthesia Plan Comments:         Anesthesia Quick Evaluation

## 2017-08-09 NOTE — Discharge Instructions (Signed)
You had 3 polyps removed. You have moderate external and internal hemorrhoids & DIVERTICULOSIS IN YOUR RIGHT AND LEFT COLON. You have AN ESOPHAGEAL RING, MODERATE gastritis/DUODENITIS DUE TO NSAID USE, & a SMALL HIATAL HERNIA.  I biopsied your stomach.    AVOID REFLUX TRIGGERS. SEE INFO BELOW.  DRINK WATER TO KEEP YOUR URINE LIGHT YELLOW.  CONTINUE YOUR WEIGHT LOSS EFFORTS.  WHILE I DO NOT WANT TO ALARM YOU, YOUR BODY MASS INDEX IS OVER 30 WHICH MEANS YOU ARE OBESE. OBESITY IS ASSOCIATED WITH AN INCREASED RISK FOR CIRRHOSIS AND ALL CANCERS, INCLUDING ESOPHAGEAL AND COLON CANCER. A WEIGHT OF 160 LBS OR LESS  WILL GET YOUR BODY MASS INDEX(BMI) UNDER 30.  FOLLOW A HIGH FIBER/LOW FAT DIET. AVOID ITEMS THAT CAUSE BLOATING. SEE INFO BELOW.  TO TREAT REFLUX AND PREVENT ULCERS FROM IBUPROFEN, CONTINUE PREVACID. TAKE 30 MINUTES PRIOR TO BREAKFAST AND SUPPER.   YOUR BIOPSY RESULTS WILL BE BACK IN 7 DAYS.  FOLLOW UP IN 6 MOS.   Next colonoscopy in 3 years.    ENDOSCOPY Care After Read the instructions outlined below and refer to this sheet in the next week. These discharge instructions provide you with general information on caring for yourself after you leave the hospital. While your treatment has been planned according to the most current medical practices available, unavoidable complications occasionally occur. If you have any problems or questions after discharge, call DR. FIELDS, [phone].  ACTIVITY  You may resume your regular activity, but move at a slower pace for the next 24 hours.   Take frequent rest periods for the next 24 hours.   Walking will help get rid of the air and reduce the bloated feeling in your belly (abdomen).   No driving for 24 hours (because of the medicine (anesthesia) used during the test).   You may shower.   Do not sign any important legal documents or operate any machinery for 24 hours (because of the anesthesia used during the test).     NUTRITION  Drink plenty of fluids.   You may resume your normal diet as instructed by your doctor.   Begin with a light meal and progress to your normal diet. Heavy or fried foods are harder to digest and may make you feel sick to your stomach (nauseated).   Avoid alcoholic beverages for 24 hours or as instructed.    MEDICATIONS  You may resume your normal medications.   WHAT YOU CAN EXPECT TODAY  Some feelings of bloating in the abdomen.   Passage of more gas than usual.   Spotting of blood in your stool or on the toilet paper  .  IF YOU HAD POLYPS REMOVED DURING THE ENDOSCOPY:  Eat a soft diet IF YOU HAVE NAUSEA, BLOATING, ABDOMINAL PAIN, OR VOMITING.    FINDING OUT THE RESULTS OF YOUR TEST Not all test results are available during your visit. DR. Oneida Alar WILL CALL YOU WITHIN 14 DAYS OF YOUR PROCEDUE WITH YOUR RESULTS. Do not assume everything is normal if you have not heard from DR. FIELDS, CALL HER OFFICE AT [phone].  SEEK IMMEDIATE MEDICAL ATTENTION AND CALL THE OFFICE: [phone] IF:  You have more than a spotting of blood in your stool.   Your belly is swollen (abdominal distention).   You are nauseated or vomiting.   You have a temperature over 101F.   You have abdominal pain or discomfort that is severe or gets worse throughout the day.    Lifestyle and home remedies TO  HELP CONTROL REFLUX  You may eliminate or reduce the frequency of heartburn by making the following lifestyle changes:   Control your weight. Being overweight is a major risk factor for heartburn and GERD. Excess pounds put pressure on your abdomen, pushing up your stomach and causing acid to back up into your esophagus.    Eat smaller meals. 4 TO 6 MEALS A DAY. This reduces pressure on the lower esophageal sphincter, helping to prevent the valve from opening and acid from washing back into your esophagus.    Loosen your belt. Clothes that fit tightly around your waist  put pressure on your abdomen and the lower esophageal sphincter.    Eliminate heartburn triggers. Everyone has specific triggers. Common triggers such as fatty or fried foods, spicy food, tomato sauce, carbonated beverages, alcohol, chocolate, mint, garlic, onion, caffeine and nicotine may make heartburn worse.    Avoid stooping or bending. Tying your shoes is OK. Bending over for longer periods to weed your garden isn't, especially soon after eating.    Don't lie down after a meal. Wait at least three to four hours after eating before going to bed, and don't lie down right after eating.    Alternative medicine  Several home remedies exist for treating GERD, but they provide only temporary relief. They include drinking baking soda (sodium bicarbonate) added to water or drinking other fluids such as baking soda mixed with cream of tartar and water.   Although these liquids create temporary relief by neutralizing, washing away or buffering acids, eventually they aggravate the situation by adding gas and fluid to your stomach, increasing pressure and causing more acid reflux. Further, adding more sodium to your diet may increase your blood pressure and add stress to your heart, and excessive bicarbonate ingestion can alter the acid-base balance in your body.   Hiatal Hernia A hiatal hernia occurs when a part of the stomach slides above the diaphragm. The diaphragm is the thin muscle separating the belly (abdomen) from the chest. A hiatal hernia can be something you are born with or develop over time. Hiatal hernias may allow stomach acid to flow back into your esophagus, the tube which carries food from your mouth to your stomach. If this acid causes problems it is called GERD (gastro-esophageal reflux disease).     High-Fiber Diet A high-fiber diet changes your normal diet to include more whole grains, legumes, fruits, and vegetables. Changes in the diet involve replacing refined  carbohydrates with unrefined foods. The calorie level of the diet is essentially unchanged. The Dietary Reference Intake (recommended amount) for adult males is 38 grams per day. For adult females, it is 25 grams per day. Pregnant and lactating women should consume 28 grams of fiber per day. Fiber is the intact part of a plant that is not broken down during digestion. Functional fiber is fiber that has been isolated from the plant to provide a beneficial effect in the body. PURPOSE  Increase stool bulk.   Ease and regulate bowel movements.   Lower cholesterol.   REDUCE RISK OF COLON CANCER  INDICATIONS THAT YOU NEED MORE FIBER  Constipation and hemorrhoids.   Uncomplicated diverticulosis (intestine condition) and irritable bowel syndrome.   Weight management.   As a protective measure against hardening of the arteries (atherosclerosis), diabetes, and cancer.   GUIDELINES FOR INCREASING FIBER IN THE DIET  Start adding fiber to the diet slowly. A gradual increase of about 5 more grams (2 slices of whole-wheat bread, 2  servings of most fruits or vegetables, or 1 bowl of high-fiber cereal) per day is best. Too rapid an increase in fiber may result in constipation, flatulence, and bloating.   Drink enough water and fluids to keep your urine clear or pale yellow. Water, juice, or caffeine-free drinks are recommended. Not drinking enough fluid may cause constipation.   Eat a variety of high-fiber foods rather than one type of fiber.   Try to increase your intake of fiber through using high-fiber foods rather than fiber pills or supplements that contain small amounts of fiber.   The goal is to change the types of food eaten. Do not supplement your present diet with high-fiber foods, but replace foods in your present diet.   INCLUDE A VARIETY OF FIBER SOURCES  Replace refined and processed grains with whole grains, canned fruits with fresh fruits, and incorporate other fiber sources. White  rice, white breads, and most bakery goods contain little or no fiber.   Brown whole-grain rice, buckwheat oats, and many fruits and vegetables are all good sources of fiber. These include: broccoli, Brussels sprouts, cabbage, cauliflower, beets, sweet potatoes, white potatoes (skin on), carrots, tomatoes, eggplant, squash, berries, fresh fruits, and dried fruits.   Cereals appear to be the richest source of fiber. Cereal fiber is found in whole grains and bran. Bran is the fiber-rich outer coat of cereal grain, which is largely removed in refining. In whole-grain cereals, the bran remains. In breakfast cereals, the largest amount of fiber is found in those with "bran" in their names. The fiber content is sometimes indicated on the label.   You may need to include additional fruits and vegetables each day.   In baking, for 1 cup white flour, you may use the following substitutions:   1 cup whole-wheat flour minus 2 tablespoons.   1/2 cup white flour plus 1/2 cup whole-wheat flour.   Low-Fat Diet BREADS, CEREALS, PASTA, RICE, DRIED PEAS, AND BEANS These products are high in carbohydrates and most are low in fat. Therefore, they can be increased in the diet as substitutes for fatty foods. They too, however, contain calories and should not be eaten in excess. Cereals can be eaten for snacks as well as for breakfast.  Include foods that contain fiber (fruits, vegetables, whole grains, and legumes). Research shows that fiber may lower blood cholesterol levels, especially the water-soluble fiber found in fruits, vegetables, oat products, and legumes. FRUITS AND VEGETABLES It is good to eat fruits and vegetables. Besides being sources of fiber, both are rich in vitamins and some minerals. They help you get the daily allowances of these nutrients. Fruits and vegetables can be used for snacks and desserts. MEATS Limit lean meat, chicken, [place], and fish to no more than 6 ounces per day. Beef, Pork, and  Lamb Use lean cuts of beef, pork, and lamb. Lean cuts include:  Extra-lean ground beef.  Arm roast.  Sirloin tip.  Center-cut ham.  Round steak.  Loin chops.  Rump roast.  Tenderloin.  Trim all fat off the outside of meats before cooking. It is not necessary to severely decrease the intake of red meat, but lean choices should be made. Lean meat is rich in protein and contains a highly absorbable form of iron. Premenopausal women, in particular, should avoid reducing lean red meat because this could increase the risk for low red blood cells (iron-deficiency anemia). The organ meats, such as liver, sweetbreads, kidneys, and brain are very rich in cholesterol. They should be  limited. Chicken and [place] These are good sources of protein. The fat of poultry can be reduced by removing the skin and underlying fat layers before cooking. Chicken and [place] can be substituted for lean red meat in the diet. Poultry should not be fried or covered with high-fat sauces. Fish and Shellfish Fish is a good source of protein. Shellfish contain cholesterol, but they usually are low in saturated fatty acids. The preparation of fish is important. Like chicken and [place], they should not be fried or covered with high-fat sauces. EGGS Egg whites contain no fat or cholesterol. They can be eaten often. Try 1 to 2 egg whites instead of whole eggs in recipes or use egg substitutes that do not contain yolk. MILK AND DAIRY PRODUCTS Use skim or 1% milk instead of 2% or whole milk. Decrease whole milk, natural, and processed cheeses. Use nonfat or low-fat (2%) cottage cheese or low-fat cheeses made from vegetable oils. Choose nonfat or low-fat (1 to 2%) yogurt. Experiment with evaporated skim milk in recipes that call for heavy cream. Substitute low-fat yogurt or low-fat cottage cheese for sour cream in dips and salad dressings. Have at least 2 servings of low-fat dairy products, such as 2 glasses of skim (or 1%) milk each day  to help get your daily calcium intake.  FATS AND OILS Reduce the total intake of fats, especially saturated fat. Butterfat, lard, and beef fats are high in saturated fat and cholesterol. These should be avoided as much as possible. Vegetable fats do not contain cholesterol, but certain vegetable fats, such as coconut oil, palm oil, and palm kernel oil are very high in saturated fats. These should be limited. These fats are often used in bakery goods, processed foods, popcorn, oils, and nondairy creamers. Vegetable shortenings and some peanut butters contain hydrogenated oils, which are also saturated fats. Read the labels on these foods and check for saturated vegetable oils. Unsaturated vegetable oils and fats do not raise blood cholesterol. However, they should be limited because they are fats and are high in calories. Total fat should still be limited to 30% of your daily caloric intake. Desirable liquid vegetable oils are corn oil, cottonseed oil, olive oil, canola oil, safflower oil, soybean oil, and sunflower oil. Peanut oil is not as good, but small amounts are acceptable. Buy a heart-healthy tub margarine that has no partially hydrogenated oils in the ingredients. Mayonnaise and salad dressings often are made from unsaturated fats, but they should also be limited because of their high calorie and fat content. Seeds, nuts, peanut butter, olives, and avocados are high in fat, but the fat is mainly the unsaturated type. These foods should be limited mainly to avoid excess calories and fat. OTHER EATING TIPS Snacks  Most sweets should be limited as snacks. They tend to be rich in calories and fats, and their caloric content outweighs their nutritional value. Some good choices in snacks are graham crackers, melba toast, soda crackers, bagels (no egg), English muffins, fruits, and vegetables. These snacks are preferable to snack crackers, [place] fries, and chips. Popcorn should be air-popped or cooked in  small amounts of liquid vegetable oil. Desserts Eat fruit, low-fat yogurt, and fruit ices. AVOID pastries, cake, and cookies. Sherbet, angel food cake, gelatin dessert, frozen low-fat yogurt, or other frozen products that do not contain saturated fat (pure fruit juice bars, frozen ice pops) are also acceptable.  COOKING METHODS Choose those methods that use little or no fat. They include: Poaching.  Braising.  Steaming.  Grilling.  Baking.  Stir-frying.  Broiling.  Microwaving.  Foods can be cooked in a nonstick pan without added fat, or use a nonfat cooking spray in regular cookware. Limit fried foods and avoid frying in saturated fat. Add moisture to lean meats by using water, broth, cooking wines, and other nonfat or low-fat sauces along with the cooking methods mentioned above. Soups and stews should be chilled after cooking. The fat that forms on top after a few hours in the refrigerator should be skimmed off. When preparing meals, avoid using excess salt. Salt can contribute to raising blood pressure in some people. EATING AWAY FROM HOME Order entres, potatoes, and vegetables without sauces or butter. When meat exceeds the size of a deck of cards (3 to 4 ounces), the rest can be taken home for another meal. Choose vegetable or fruit salads and ask for low-calorie salad dressings to be served on the side. Use dressings sparingly. Limit high-fat toppings, such as bacon, crumbled eggs, cheese, sunflower seeds, and olives. Ask for heart-healthy tub margarine instead of butter.   PATIENT INSTRUCTIONS POST-ANESTHESIA  IMMEDIATELY FOLLOWING SURGERY:  Do not drive or operate machinery for the first twenty four hours after surgery.  Do not make any important decisions for twenty four hours after surgery or while taking narcotic pain medications or sedatives.  If you develop intractable nausea and vomiting or a severe headache please notify your doctor immediately.  FOLLOW-UP:  Please make an  appointment with your surgeon as instructed. You do not need to follow up with anesthesia unless specifically instructed to do so.  WOUND CARE INSTRUCTIONS (if applicable):  Keep a dry clean dressing on the anesthesia/puncture wound site if there is drainage.  Once the wound has quit draining you may leave it open to air.  Generally you should leave the bandage intact for twenty four hours unless there is drainage.  If the epidural site drains for more than 36-48 hours please call the anesthesia department.  QUESTIONS?:  Please feel free to call your physician or the hospital operator if you have any questions, and they will be happy to assist you.

## 2017-08-09 NOTE — H&P (Signed)
Primary Care Physician:  Celene Squibb, MD Primary Gastroenterologist:  Dr. Oneida Alar  Pre-Procedure History & Physical: HPI:  Paula Sutton is a 62 y.o. female here for PERSONAL HISTORY OF POLYPS/DYSPEPSIA.  Past Medical History:  Diagnosis Date  . Arthritis    knee  . GERD (gastroesophageal reflux disease)   . Headache     Past Surgical History:  Procedure Laterality Date  . ABDOMINAL HYSTERECTOMY    . COLONOSCOPY WITH ESOPHAGOGASTRODUODENOSCOPY (EGD)  2014   [place] Right     Prior to Admission medications   Medication Sig Start Date End Date Taking? Authorizing Provider  estradiol (CLIMARA - DOSED IN MG/24 HR) 0.1 mg/24hr patch Place 0.1 mg onto the skin See admin instructions. Apply weekly as needed for hot flashes 01/08/16  Yes [provider]  famotidine (PEPCID) 20 MG tablet Take 20 mg by mouth daily as needed for heartburn or indigestion.   Yes [provider]  ibuprofen (ADVIL,MOTRIN) 200 MG tablet Take 400 mg by mouth 3 (three) times daily as needed for headache or moderate pain.   Yes [provider]  linaclotide Rolan Lipa) 145 MCG CAPS capsule Take 1 capsule (145 mcg total) by mouth daily before breakfast. 06/03/17  Yes Mahala Menghini, PA-C  loratadine (CLARITIN) 10 MG tablet Take 10 mg by mouth daily as needed for allergies.   Yes [provider]  Na Sulfate-K Sulfate-Mg Sulf (SUPREP BOWEL PREP KIT) 17.5-3.13-1.6 GM/177ML SOLN Take 1 kit by mouth as directed. 06/03/17  Yes Fields, Marga Melnick, MD  Omega-3 Fatty Acids (FISH OIL) 1000 MG CAPS Take 1,000 mg by mouth 2 (two) times daily.   Yes [provider]  Red Yeast Rice 600 MG TABS Take 1,200 mg by mouth at bedtime.   Yes [provider]  b complex vitamins tablet Take 1 tablet by mouth daily.    [provider]  CALCIUM PO Take 1,000 mg by mouth 2 (two) times daily.    [provider]  cholecalciferol (VITAMIN D) 1000  units tablet Take 1,000 Units by mouth 2 (two) times daily.    [provider]  DIGESTIVE ENZYMES PO Take 1 capsule by mouth 2 (two) times daily.     [provider]    Allergies as of 06/03/2017 - Review Complete 06/03/2017  Allergen Reaction Noted  . Sulfa antibiotics  01/27/2016    Family History  Problem Relation Age of Onset  . Colon cancer Neg Hx   . Breast cancer Neg Hx     Social History   Socioeconomic History  . Marital status: Married    Spouse name: Not on file  . Number of children: Not on file  . Years of education: Not on file  . Highest education level: Not on file  Occupational History  . Not on file  Social Needs  . Financial resource strain: Not on file  . Food insecurity:    Worry: Not on file    Inability: Not on file  . Transportation needs:    Medical: Not on file    Non-medical: Not on file  Tobacco Use  . Smoking status: Former Smoker    Types: Cigarettes    Last attempt to quit: 01/27/1971    Years since quitting: 46.5  . Smokeless tobacco: Never Used  Substance and Sexual Activity  . Alcohol use: Yes    Comment: Occasional: 1-2 times/month  . Drug use: Yes    Frequency:  7.0 times per week    Types: Marijuana  . Sexual activity: Not on file  Lifestyle  . Physical activity:    Days per week: Not on file    Minutes per session: Not on file  . Stress: Not on file  Relationships  . Social connections:    Talks on phone: Not on file    Gets together: Not on file    Attends religious service: Not on file    Active member of club or organization: Not on file    Attends meetings of clubs or organizations: Not on file    Relationship status: Not on file  . Intimate partner violence:    Fear of current or ex partner: Not on file    Emotionally abused: Not on file    Physically abused: Not on file    Forced sexual activity: Not on file  Other Topics Concern  . Not on file  Social History Narrative  . Not on file     Review of Systems: See HPI, otherwise negative ROS   Physical Exam: BP 123/69   Pulse (!) 58   Temp 98 F (36.7 C) (Oral)   Resp 14   SpO2 98%  General:   Alert,  pleasant and cooperative in NAD Head:  Normocephalic and atraumatic. Neck:  Supple; Lungs:  Clear throughout to auscultation.    Heart:  Regular rate and rhythm. Abdomen:  Soft, nontender and nondistended. Normal bowel sounds, without guarding, and without rebound.   Neurologic:  Alert and  oriented x4;  grossly normal neurologically.  Impression/Plan:     PERSONAL HISTORY OF POLYPS/DYSPEPSIA.  PLAN: 1. TCS/EGD TODAY

## 2017-08-09 NOTE — Anesthesia Postprocedure Evaluation (Signed)
Anesthesia Post Note  Patient: PETE MERTEN  Procedure(s) Performed: COLONOSCOPY WITH PROPOFOL (N/A ) ESOPHAGOGASTRODUODENOSCOPY (EGD) WITH PROPOFOL (N/A ) POLYPECTOMY BIOPSY  Patient location during evaluation: Short Stay Anesthesia Type: General Level of consciousness: awake and alert and patient cooperative Pain management: satisfactory to patient Vital Signs Assessment: post-procedure vital signs reviewed and stable Respiratory status: spontaneous breathing Cardiovascular status: stable Postop Assessment: no apparent nausea or vomiting Anesthetic complications: no     Last Vitals:  Vitals:   08/09/17 1024 08/09/17 1031  BP: 136/69 130/66  Pulse: (!) 54 (!) 51  Resp: 17   Temp:  36.5 C  SpO2: 99% 97%    Last Pain:  Vitals:   08/09/17 1031  TempSrc: Oral  PainSc: 0-No pain                 Natori Gudino

## 2017-08-09 NOTE — Transfer of Care (Signed)
Immediate Anesthesia Transfer of Care Note  Patient: Paula Sutton  Procedure(s) Performed: COLONOSCOPY WITH PROPOFOL (N/A ) ESOPHAGOGASTRODUODENOSCOPY (EGD) WITH PROPOFOL (N/A ) POLYPECTOMY BIOPSY  Patient Location: PACU  Anesthesia Type:MAC  Level of Consciousness: awake, alert , oriented and patient cooperative  Airway & Oxygen Therapy: Patient Spontanous Breathing  Post-op Assessment: Report given to RN and Post -op Vital signs reviewed and stable  Post vital signs: Reviewed and stable  Last Vitals:  Vitals Value Taken Time  BP 118/72 08/09/2017 10:15 AM  Temp 36.5 C 08/09/2017 10:15 AM  Pulse 59 08/09/2017 10:16 AM  Resp 16 08/09/2017 10:16 AM  SpO2 99 % 08/09/2017 10:16 AM  Vitals shown include unvalidated device data.  Last Pain:  Vitals:   08/09/17 1015  TempSrc:   PainSc: (P) 0-No pain         Complications: No apparent anesthesia complications

## 2017-08-09 NOTE — Anesthesia Procedure Notes (Signed)
Procedure Name: MAC Date/Time: 08/09/2017 9:24 AM Performed by: Vista Deck, CRNA Pre-anesthesia Checklist: Patient identified, Emergency Drugs available, Suction available, Timeout performed and Patient being monitored Patient Re-evaluated:Patient Re-evaluated prior to induction Oxygen Delivery Method: Nasal Cannula

## 2017-08-09 NOTE — Op Note (Signed)
[facility] Patient Name: Paula Sutton Procedure Date: 08/09/2017 8:57 AM MRN: [id number] Date of Birth: 05-23-1955 Attending MD: Barney Drain MD, MD CSN: [id number] Age: 62 Admit Type: Outpatient Procedure:                Colonoscopy WITH COLD SNARE/SNARE CAUTERY                            POLYPECTOMY Indications:              Personal history of colonic polyps Providers:                Barney Drain MD, MD, Janeece Riggers, RN, Aram Candela Referring MD:             Edwinna Areola. Hall MD Medicines:                Propofol per Anesthesia Complications:            No immediate complications. Estimated Blood Loss:     Estimated blood loss was minimal. Procedure:                Pre-Anesthesia Assessment:                           - Prior to the procedure, a History and Physical                            was performed, and patient medications and                            allergies were reviewed. The patient's tolerance of                            previous anesthesia was also reviewed. The risks                            and benefits of the procedure and the sedation                            options and risks were discussed with the patient.                            All questions were answered, and informed consent                            was obtained. Prior Anticoagulants: The patient has                            taken no previous anticoagulant or antiplatelet                            agents. ASA Grade Assessment: I - A normal, healthy                            patient. After reviewing the risks and benefits,  the patient was deemed in satisfactory condition to                            undergo the procedure.                           After obtaining informed consent, the colonoscope                            was passed under direct vision. Throughout the                            procedure, the patient's blood pressure, pulse, and             oxygen saturations were monitored continuously. The                            [id number] ([id number]) scope was introduced through                            the anus and advanced to the the cecum, identified                            by appendiceal orifice and ileocecal valve. The                            colonoscopy was somewhat difficult due to                            restricted mobility of the colon. Successful                            completion of the procedure was aided by                            straightening and shortening the scope to obtain                            bowel loop reduction and COLOWRAP. The patient                            tolerated the procedure fairly well. The quality of                            the bowel preparation was excellent. The ileocecal                            valve, appendiceal orifice, and rectum were                            photographed. Scope In: 9:39:14 AM Scope Out: 9:54:53 AM Scope Withdrawal Time: 0 hours 12 minutes 30 seconds  Total Procedure Duration: 0 hours 15 minutes 39 seconds  Findings:      Two sessile polyps were found in the proximal transverse  colon and       cecum. The polyps were 5 to 6 mm in size. These polyps were removed with       a hot snare. Resection and retrieval were complete.      Multiple small and large-mouthed diverticula were found in the       recto-sigmoid colon, sigmoid colon, descending colon and cecum.      External and internal hemorrhoids were found during retroflexion. The       hemorrhoids were moderate.      A 3 mm polyp was found in the rectum. The polyp was sessile. The polyp       was removed with a cold snare. Resection and retrieval were complete. Impression:               - Three polyps in the rectum, in the proximal                            transverse colon and in the cecum, removed.                           - Diverticulosis in the recto-sigmoid colon, in the                             sigmoid colon, in the descending colon and in the                            cecum.                           - MODERATE EXTERNAL AND Internal hemorrhoids. Moderate Sedation:      Per Anesthesia Care Recommendation:           - High fiber diet.                           - Continue present medications.                           - Await pathology results.                           - Repeat colonoscopy in 3 years for surveillance W/                            MAC/COLOWRAP.                           - Patient has a contact number available for                            emergencies. The signs and symptoms of potential                            delayed complications were discussed with the                            patient. Return to normal activities tomorrow.  Written discharge instructions were provided to the                            patient. Procedure Code(s):        --- Professional ---                           [phone], Colonoscopy, flexible; with removal of                            tumor(s), polyp(s), or other lesion(s) by snare                            technique Diagnosis Code(s):        --- Professional ---                           K62.1, Rectal polyp                           D12.3, Benign neoplasm of transverse colon (hepatic                            flexure or splenic flexure)                           D12.0, Benign neoplasm of cecum                           K64.8, Other hemorrhoids                           Z86.010, Personal history of colonic polyps                           K57.30, Diverticulosis of large intestine without                            perforation or abscess without bleeding CPT copyright 2017 American Medical Association. All rights reserved. The codes documented in this report are preliminary and upon coder review may  be revised to meet current compliance requirements. Barney Drain, MD Barney Drain MD,  MD 08/09/2017 10:19:16 AM This report has been signed electronically. Number of Addenda: 0

## 2017-08-09 NOTE — Op Note (Signed)
[facility] Patient Name: Paula Sutton Procedure Date: 08/09/2017 9:27 AM MRN: [id number] Date of Birth: [birth year] Attending MD: Barney Drain MD, MD CSN: [id number] Age: 62 Admit Type: Outpatient Procedure:                Upper GI endoscopy WITH COLD FORCEPS BIOPSY Indications:              Epigastric abdominal pain, Dyspepsia Providers:                Barney Drain MD, MD, Janeece Riggers, RN, Aram Candela Referring MD:             Edwinna Areola. Hall MD Medicines:                Propofol per Anesthesia Complications:            No immediate complications. Estimated Blood Loss:     Estimated blood loss was minimal. Procedure:                Pre-Anesthesia Assessment:                           - Prior to the procedure, a History and Physical                            was performed, and patient medications and                            allergies were reviewed. The patient's tolerance of                            previous anesthesia was also reviewed. The risks                            and benefits of the procedure and the sedation                            options and risks were discussed with the patient.                            All questions were answered, and informed consent                            was obtained. Prior Anticoagulants: The patient has                            taken no previous anticoagulant or antiplatelet                            agents. ASA Grade Assessment: I - A normal, healthy                            patient. After reviewing the risks and benefits,                            the patient was deemed in satisfactory condition to  undergo the procedure. After obtaining informed                            consent, the endoscope was passed under direct                            vision. Throughout the procedure, the patient's                            blood pressure, pulse, and oxygen saturations were   monitored continuously. The [id number] ([id number])                            scope was introduced through the mouth, and                            advanced to the second part of duodenum. The upper                            GI endoscopy was accomplished without difficulty.                            The patient tolerated the procedure well. Scope In: 10:00:45 AM Scope Out: 10:05:10 AM Total Procedure Duration: 0 hours 4 minutes 25 seconds  Findings:      A low-grade of narrowing Schatzki ring was found at the gastroesophageal       junction.      A small hiatal hernia was present.      Localized mild inflammation characterized by congestion (edema),       erosions and erythema was found in the gastric body and in the gastric       antrum. Biopsies were taken with a cold forceps for Helicobacter pylori       testing.      Patchy mild inflammation characterized by congestion (edema) and       erythema was found in the duodenal bulb.      The second portion of the duodenum was normal. Impression:               - Low-grade of narrowing Schatzki ring DUE TO GERD.                           - Small hiatal hernia.                           - MILD Gastritis & DUODENITIS DUE TO NSAIDS. Moderate Sedation:      Per Anesthesia Care Recommendation:           - Await pathology results.                           - Use Prevacid (lansoprazole) 30 mg PO daily for                            the rest of the patient's life. AVOID REFLUX  TRIGGERS.                           - Return to my office in 6 months.                           - High fiber diet and low fat diet. LOSE WEIGHT TO                            BMI < 30.                           - Patient has a contact number available for                            emergencies. The signs and symptoms of potential                            delayed complications were discussed with the                            patient. Return to  normal activities tomorrow.                            Written discharge instructions were provided to the                            patient. Procedure Code(s):        --- Professional ---                           [phone], Esophagogastroduodenoscopy, flexible,                            transoral; with biopsy, single or multiple Diagnosis Code(s):        --- Professional ---                           K22.2, Esophageal obstruction                           K44.9, Diaphragmatic hernia without obstruction or                            gangrene                           K29.70, Gastritis, unspecified, without bleeding                           K29.80, Duodenitis without bleeding                           R10.13, Epigastric pain CPT copyright 2017 American Medical Association. All rights reserved. The codes documented in this report are preliminary and upon coder review may  be revised to meet current compliance requirements. Barney Drain, MD Barney Drain MD,  MD 08/09/2017 10:24:34 AM This report has been signed electronically. Number of Addenda: 0

## 2017-08-11 ENCOUNTER — Encounter (HOSPITAL_COMMUNITY): Payer: Self-pay | Admitting: Gastroenterology

## 2017-08-11 NOTE — Progress Notes (Signed)
LMOM to call.

## 2017-08-12 ENCOUNTER — Telehealth: Payer: Self-pay | Admitting: Gastroenterology

## 2017-08-12 NOTE — Progress Notes (Signed)
CC'D TO PCP °

## 2017-08-12 NOTE — Telephone Encounter (Signed)
Went over results and routed note to [place].

## 2017-08-12 NOTE — Telephone Encounter (Signed)
Patient called and left a message that we had called her.  Please call her back

## 2017-08-12 NOTE — Telephone Encounter (Signed)
Please call pt. She had THREE simple adenomas removed. HER stomach Bx shows gastritis DUE TO IBUPROFEN.    AVOID REFLUX TRIGGERS. Marland Kitchen  DRINK WATER TO KEEP YOUR URINE LIGHT YELLOW.  CONTINUE YOUR WEIGHT LOSS EFFORTS.  A WEIGHT OF 160 LBS OR LESS  WILL GET YOUR BODY MASS INDEX(BMI) UNDER 30.  FOLLOW A HIGH FIBER/LOW FAT DIET. AVOID ITEMS THAT CAUSE BLOATING.   TO TREAT REFLUX AND PREVENT ULCERS FROM IBUPROFEN, CONTINUE PREVACID. TAKE 30 MINUTES PRIOR TO BREAKFAST AND SUPPER.    FOLLOW UP IN 6 MOS.   Next colonoscopy in 3 years.

## 2017-08-12 NOTE — Progress Notes (Signed)
cc'd to pcp 

## 2017-08-15 NOTE — Telephone Encounter (Signed)
Pt notified of results. Pt advised on reflux trigger instructions per SF. Pt also wanted to ask about her Rx Linzess 145 mcg.  It is over $300 and is covered under pts insurance. Pt will need a tier exception. Samples of Linzess 159mcg were left up front for pt pick up. Routing message to DS.

## 2017-08-16 ENCOUNTER — Encounter: Payer: Self-pay | Admitting: Gastroenterology

## 2017-08-16 NOTE — Progress Notes (Signed)
Paula Sutton, please forward ECG report to PCP.

## 2017-08-16 NOTE — Telephone Encounter (Signed)
PATIENT SCHEDULED AND ON RECALL  °

## 2017-08-18 ENCOUNTER — Telehealth: Payer: Self-pay

## 2017-08-18 NOTE — Telephone Encounter (Signed)
Pt's cost for the Linzess 145 mcg would be greater than $300.00. I called [employer] and spoke with Quillian Quince who said her co-payment is $0.00, but the cost would be toward pt's deductible. No tier acception is offered. Quillian Quince is faxing over info on doing an appeal.

## 2017-08-23 NOTE — Telephone Encounter (Signed)
Letter of appeal is needed. Placing on the desk for Neil Crouch, [place].

## 2017-09-07 DIAGNOSIS — M546 Pain in thoracic spine: Secondary | ICD-10-CM | POA: Diagnosis not present

## 2017-09-07 DIAGNOSIS — S134XXA Sprain of ligaments of cervical spine, initial encounter: Secondary | ICD-10-CM | POA: Diagnosis not present

## 2017-09-07 DIAGNOSIS — M531 Cervicobrachial syndrome: Secondary | ICD-10-CM | POA: Diagnosis not present

## 2017-09-08 DIAGNOSIS — S134XXA Sprain of ligaments of cervical spine, initial encounter: Secondary | ICD-10-CM | POA: Diagnosis not present

## 2017-09-08 DIAGNOSIS — M546 Pain in thoracic spine: Secondary | ICD-10-CM | POA: Diagnosis not present

## 2017-09-08 DIAGNOSIS — M531 Cervicobrachial syndrome: Secondary | ICD-10-CM | POA: Diagnosis not present

## 2017-09-09 DIAGNOSIS — R001 Bradycardia, unspecified: Secondary | ICD-10-CM | POA: Diagnosis not present

## 2017-09-09 NOTE — Telephone Encounter (Signed)
I'm not sure an appeal works in this situation.   The medication is covered but she has not met her deductible based on information provided below.   According to the letter received from [facility] [employer]  " The request for a lower co-pay is denied.  You are paying the lowest co-pay possible for the requested drug according to your prescription benefit plan.  No tearing exception offered."   I would suggest she go back to OTC Miralax 17 grams po once to twice daily to keep stools soft.   She can also call her insurance to find out how far she is from meeting her deductible.

## 2017-09-12 DIAGNOSIS — M546 Pain in thoracic spine: Secondary | ICD-10-CM | POA: Diagnosis not present

## 2017-09-12 DIAGNOSIS — M531 Cervicobrachial syndrome: Secondary | ICD-10-CM | POA: Diagnosis not present

## 2017-09-12 DIAGNOSIS — S134XXA Sprain of ligaments of cervical spine, initial encounter: Secondary | ICD-10-CM | POA: Diagnosis not present

## 2017-09-12 NOTE — Telephone Encounter (Signed)
agree

## 2017-09-12 NOTE — Telephone Encounter (Signed)
Pt is aware and will try the Miralax once to twice a day. She is getting ready to go out of town and would like to get samples of Linzess 145 mcg for 15 days since that worked so well, and then start the Miralax.  Magda Paganini, please advise!

## 2017-09-12 NOTE — Telephone Encounter (Addendum)
Per Neil Crouch, [place] OK to give pt a bottle of the Linzess 72 mcg ( #30 capsules)  and pt can take 2 tablets daily 30 min before breakfast.   I called pt and she is aware and will pick up tomorrow.

## 2017-09-22 DIAGNOSIS — S83242D Other tear of medial meniscus, current injury, left knee, subsequent encounter: Secondary | ICD-10-CM | POA: Diagnosis not present

## 2017-09-24 NOTE — Progress Notes (Signed)
Cardiology Office Note   Date:  09/27/2017   ID:  Paula Sutton, DOB May 15, 1955, MRN [id number]  PCP:  Celene Squibb, MD  Cardiologist:   No primary care provider on file. Referring:  Celene Squibb, MD  Chief Complaint  Patient presents with  . Abnormal ECG      History of Present Illness: Paula Sutton is a 62 y.o. female who is referred by Celene Squibb, MD for evaluation of bradycardia and an abnormal EKG. she has no past cardiac history other than the treadmill of years ago.  She reports that this was related to preoperative evaluation.  She does not think there are any abnormalities.  She is otherwise done well although she is limited by joint problems.  She is had right total knee replacement.  She needs that to be re-placed again and she needs left arthroscopic surgery.  She was noted recently during a colonoscopy to have an abnormal EKG with a report of left atrial enlargement on this EKG.  This was in May.  There are no other old EKGs for comparison.  She was referred here prior to getting the knee surgeries.  She otherwise does well.  She denies any cardiovascular symptoms.  She does not get chest pressure, neck or arm discomfort.  She has no palpitations, presyncope or syncope.  She is limited in her activities because of her joints but she can do things like grocery shopping.  With this she denies any shortness of breath.  She has no PND or orthopnea.    Past Medical History:  Diagnosis Date  . Arthritis    knee  . Dyslipidemia   . GERD (gastroesophageal reflux disease)     Past Surgical History:  Procedure Laterality Date  . ABDOMINAL HYSTERECTOMY    . BIOPSY  08/09/2017   Procedure: BIOPSY;  Surgeon: Danie Binder, MD;  Location: [facility];  Service: Endoscopy;;  gastric biopsy for h pylori  . COLONOSCOPY WITH ESOPHAGOGASTRODUODENOSCOPY (EGD)  2014   [place] N/A 08/09/2017   Procedure: COLONOSCOPY WITH PROPOFOL;   Surgeon: Danie Binder, MD;  Location: [facility];  Service: Endoscopy;  Laterality: N/A;  9:15am  . ESOPHAGOGASTRODUODENOSCOPY (EGD) WITH PROPOFOL N/A 08/09/2017   Procedure: ESOPHAGOGASTRODUODENOSCOPY (EGD) WITH PROPOFOL;  Surgeon: Danie Binder, MD;  Location: [facility];  Service: Endoscopy;  Laterality: N/A;  . KNEE SURGERY Right   . POLYPECTOMY  08/09/2017   Procedure: POLYPECTOMY;  Surgeon: Danie Binder, MD;  Location: [facility];  Service: Endoscopy;;  cecal polyp hs, transverse colon polyp hs, rectal polyp cs     Current Outpatient Medications  Medication Sig Dispense Refill  . b complex vitamins tablet Take 1 tablet by mouth daily.    Marland Kitchen CALCIUM PO Take 1,000 mg by mouth 2 (two) times daily.    . cholecalciferol (VITAMIN D) 1000 units tablet Take 1,000 Units by mouth 2 (two) times daily.    Marland Kitchen DIGESTIVE ENZYMES PO Take 1 capsule by mouth 2 (two) times daily.     Marland Kitchen estradiol (CLIMARA - DOSED IN MG/24 HR) 0.1 mg/24hr patch Place 0.1 mg onto the skin See admin instructions. Apply weekly as needed for hot flashes    . lansoprazole (PREVACID) 30 MG capsule 1 po 30 mins prior to first meal 30 capsule 11  . linaclotide (LINZESS) 145 MCG CAPS capsule Take 1 capsule (145 mcg total) by mouth daily  before breakfast. 30 capsule 5  . loratadine (CLARITIN) 10 MG tablet Take 10 mg by mouth daily as needed for allergies.    . Omega-3 Fatty Acids (FISH OIL) 1000 MG CAPS Take 1,000 mg by mouth 2 (two) times daily.    . Red Yeast Rice 600 MG TABS Take 1,200 mg by mouth at bedtime.     No current facility-administered medications for this visit.     Allergies:   Sulfa antibiotics    Social History:  The patient  reports that she quit smoking about 46 years ago. Her smoking use included cigarettes. She has never used smokeless tobacco. She reports that she drinks alcohol. She reports that she has current or past drug history. Drug: Marijuana. Frequency: 7.00 times per week.   Family  History:  The patient's family history includes Alcohol abuse in her mother.    ROS:  Please see the history of present illness.   Otherwise, review of systems are positive for none.   All other systems are reviewed and negative.    PHYSICAL EXAM: VS:  BP 102/74 (BP Location: Right Arm)   Pulse 67   Ht 5' 2.5" (1.588 m)   Wt 169 lb 12.8 oz (77 kg)   BMI 30.56 kg/m   , BMI Body mass index is 30.56 kg/m. GENERAL:  Well appearing HEENT:  Pupils equal round and reactive, fundi not visualized, oral mucosa unremarkable NECK:  No jugular venous distention, waveform within normal limits, carotid upstroke brisk and symmetric, no bruits, no thyromegaly LYMPHATICS:  No cervical, inguinal adenopathy LUNGS:  Clear to auscultation bilaterally BACK:  No CVA tenderness CHEST:  Unremarkable HEART:  PMI not displaced or sustained,S1 and S2 within normal limits, no S3, no S4, no clicks, no rubs, no murmurs ABD:  Flat, positive bowel sounds normal in frequency in pitch, no bruits, no rebound, no guarding, no midline pulsatile mass, no hepatomegaly, no splenomegaly EXT:  2 plus pulses throughout, no edema, no cyanosis no clubbing SKIN:  No rashes no nodules NEURO:  Cranial nerves II through XII grossly intact, motor grossly intact throughout PSYCH:  Cognitively intact, oriented to person place and time    EKG:  EKG is ordered today. The ekg ordered today demonstrates sinus rhythm, incomplete right bundle branch block, rate 67, left axis deviation, left anterior fascicular block.   Recent Labs: 08/04/2017: BUN 24; Creatinine, Ser 1.07; Hemoglobin 14.3; Platelets 273; Potassium 4.6; Sodium 136    Lipid Panel No results found for: CHOL, TRIG, HDL, CHOLHDL, VLDL, LDLCALC, LDLDIRECT    Wt Readings from Last 3 Encounters:  09/27/17 169 lb 12.8 oz (77 kg)  08/04/17 176 lb (79.8 kg)  06/03/17 176 lb 12.8 oz (80.2 kg)      Other studies Reviewed: Additional studies/ records that were reviewed  today include: EKG. Review of the above records demonstrates:  Please see elsewhere in the note.     ASSESSMENT AND PLAN:  BRADYCARDIA:   The patient does have a slow heart rhythm and some conduction disturbances.  However, she has no symptoms related to this.  At this point in the absence of symptoms or significant risk factors and with a reasonable functional status with the mild abnormalities on her EKG no further testing is indicated.  ABNORMAL EKG:  As above.     PREOP:    Given the lack of symptoms, lack of high risk findings, reasonable functional level the patient is at acceptable risk for the planned surgery.  No change  in therapy is indicated.  Current medicines are reviewed at length with the patient today.  The patient does not have concerns regarding medicines.  The following changes have been made:  no change  Labs/ tests ordered today include:   Orders Placed This Encounter  Procedures  . EKG 12-Lead     Disposition:   FU with me as needed.     Signed, Minus Breeding, MD  09/27/2017 3:25 PM    [facility]

## 2017-09-27 ENCOUNTER — Encounter (INDEPENDENT_AMBULATORY_CARE_PROVIDER_SITE_OTHER): Payer: Self-pay

## 2017-09-27 ENCOUNTER — Ambulatory Visit (INDEPENDENT_AMBULATORY_CARE_PROVIDER_SITE_OTHER): Payer: 59 | Admitting: Cardiology

## 2017-09-27 ENCOUNTER — Encounter: Payer: Self-pay | Admitting: Cardiology

## 2017-09-27 VITALS — BP 102/74 | HR 67 | Ht 62.5 in | Wt 169.8 lb

## 2017-09-27 DIAGNOSIS — R9431 Abnormal electrocardiogram [ECG] [EKG]: Secondary | ICD-10-CM | POA: Diagnosis not present

## 2017-09-27 NOTE — Patient Instructions (Signed)
Medication Instructions:  Continue current medications  If you need a refill on your cardiac medications before your next appointment, please call your pharmacy.  Labwork: None Ordered  Testing/Procedures: None Ordered  Follow-Up: Your physician wants you to follow-up in: As Needed.      Thank you for choosing [facility]!!       

## 2017-10-06 ENCOUNTER — Telehealth: Payer: Self-pay

## 2017-10-06 MED ORDER — LANSOPRAZOLE 30 MG PO CPDR: DELAYED_RELEASE_CAPSULE | ORAL | 11 refills | Status: DC

## 2017-10-06 NOTE — Telephone Encounter (Signed)
Pt left Vm that she was told to take Prevacid bid and has been taking that way. Her Rx was sent in for once a day, so she cannot get filled yet.  Dr. Nona Dell, please advise

## 2017-10-06 NOTE — Telephone Encounter (Signed)
Pt notified that RX was sent to pharmacy.  

## 2017-10-06 NOTE — Telephone Encounter (Signed)
PLEASE CALL PT. Rx sent FOR PREVACID BID.

## 2017-11-13 HISTORY — PX: KNEE ARTHROSCOPY: SUR90

## 2017-11-22 DIAGNOSIS — M948X6 Other specified disorders of cartilage, lower leg: Secondary | ICD-10-CM | POA: Diagnosis not present

## 2017-11-22 DIAGNOSIS — M11261 Other chondrocalcinosis, right knee: Secondary | ICD-10-CM | POA: Diagnosis not present

## 2017-11-22 DIAGNOSIS — G8918 Other acute postprocedural pain: Secondary | ICD-10-CM | POA: Diagnosis not present

## 2017-11-22 DIAGNOSIS — M23322 Other meniscus derangements, posterior horn of medial meniscus, left knee: Secondary | ICD-10-CM | POA: Diagnosis not present

## 2017-11-22 DIAGNOSIS — M11262 Other chondrocalcinosis, left knee: Secondary | ICD-10-CM | POA: Diagnosis not present

## 2017-11-22 DIAGNOSIS — M25562 Pain in left knee: Secondary | ICD-10-CM | POA: Diagnosis not present

## 2017-11-22 DIAGNOSIS — M23222 Derangement of posterior horn of medial meniscus due to old tear or injury, left knee: Secondary | ICD-10-CM | POA: Diagnosis not present

## 2017-12-20 DIAGNOSIS — H2511 Age-related nuclear cataract, right eye: Secondary | ICD-10-CM | POA: Diagnosis not present

## 2017-12-20 DIAGNOSIS — H43813 Vitreous degeneration, bilateral: Secondary | ICD-10-CM | POA: Diagnosis not present

## 2017-12-20 DIAGNOSIS — H524 Presbyopia: Secondary | ICD-10-CM | POA: Diagnosis not present

## 2017-12-20 DIAGNOSIS — H26492 Other secondary cataract, left eye: Secondary | ICD-10-CM | POA: Diagnosis not present

## 2017-12-29 DIAGNOSIS — H25811 Combined forms of age-related cataract, right eye: Secondary | ICD-10-CM | POA: Diagnosis not present

## 2017-12-29 DIAGNOSIS — H2511 Age-related nuclear cataract, right eye: Secondary | ICD-10-CM | POA: Diagnosis not present

## 2018-01-25 ENCOUNTER — Other Ambulatory Visit: Payer: Self-pay | Admitting: Obstetrics & Gynecology

## 2018-01-25 DIAGNOSIS — Z1231 Encounter for screening mammogram for malignant neoplasm of breast: Secondary | ICD-10-CM

## 2018-01-26 DIAGNOSIS — S134XXA Sprain of ligaments of cervical spine, initial encounter: Secondary | ICD-10-CM | POA: Diagnosis not present

## 2018-01-26 DIAGNOSIS — M546 Pain in thoracic spine: Secondary | ICD-10-CM | POA: Diagnosis not present

## 2018-01-26 DIAGNOSIS — M47816 Spondylosis without myelopathy or radiculopathy, lumbar region: Secondary | ICD-10-CM | POA: Diagnosis not present

## 2018-02-08 DIAGNOSIS — M546 Pain in thoracic spine: Secondary | ICD-10-CM | POA: Diagnosis not present

## 2018-02-08 DIAGNOSIS — M47816 Spondylosis without myelopathy or radiculopathy, lumbar region: Secondary | ICD-10-CM | POA: Diagnosis not present

## 2018-02-08 DIAGNOSIS — S134XXA Sprain of ligaments of cervical spine, initial encounter: Secondary | ICD-10-CM | POA: Diagnosis not present

## 2018-02-13 ENCOUNTER — Telehealth: Payer: Self-pay | Admitting: Gastroenterology

## 2018-02-13 MED ORDER — LINACLOTIDE 145 MCG PO CAPS: 145.0000 ug | ORAL_CAPSULE | Freq: Every day | ORAL | 3 refills | Status: DC

## 2018-02-13 NOTE — Addendum Note (Signed)
Addended by: Annitta Needs on: 02/13/2018 01:10 PM   Modules accepted: Orders

## 2018-02-13 NOTE — Telephone Encounter (Signed)
PT would like a refill on the Linzess 145 mcg sent to the pharmacy.

## 2018-02-13 NOTE — Telephone Encounter (Signed)
Pt has a question about her Linzess prescription. Please call her at [phone]

## 2018-02-13 NOTE — Telephone Encounter (Signed)
Done

## 2018-02-15 ENCOUNTER — Ambulatory Visit: Payer: 59 | Admitting: Gastroenterology

## 2018-02-23 NOTE — Progress Notes (Signed)
EKG 09-27-17 Epic   CLEARANCE COURTNEY KEATTS 12-05-17 ON CHART

## 2018-02-23 NOTE — Patient Instructions (Signed)
Paula Sutton  02/23/2018   Your procedure is scheduled on: 03-01-18   Report to [facility] Main  Entrance    Report to admitting at 11:15AM    Call this number if you have problems the morning of surgery [phone]     Remember: Do not eat food :After Midnight.YOU MAY HAVE CLEAR LIQUIDS FROM MIDNIGHT UNTIL 7:45AM. NOTHING BY MOUTH AFTER 7:45AM! BRUSH YOUR TEETH MORNING OF SURGERY AND RINSE YOUR MOUTH OUT, NO CHEWING GUM CANDY OR MINTS.     CLEAR LIQUID DIET   Foods Allowed                                                                     Foods Excluded  Coffee and tea, regular and decaf                             liquids that you cannot  Plain Jell-O in any flavor                                             see through such as: Fruit ices (not with fruit pulp)                                     milk, soups, orange juice  Iced Popsicles                                    All solid food Carbonated beverages, regular and diet                                    Cranberry, grape and apple juices Sports drinks like Gatorade Lightly seasoned clear broth or consume(fat free) Sugar, honey syrup  Sample Menu Breakfast                                Lunch                                     Supper Cranberry juice                    Beef broth                            Chicken broth Jell-O                                     Grape juice  Apple juice Coffee or tea                        Jell-O                                      Popsicle                                                Coffee or tea                        Coffee or tea  _____________________________________________________________________       Take these medicines the morning of surgery with A SIP OF WATER: PREVACID IF NEEDED                                You may not have any metal on your body including hair pins and              piercings  Do not wear  jewelry, make-up, lotions, powders or perfumes, deodorant             Do not wear nail polish.  Do not shave  48 hours prior to surgery.             Do not bring valuables to the hospital. [place].  Contacts, dentures or bridgework may not be worn into surgery.  Leave suitcase in the car. After surgery it may be brought to your room.                  Please read over the following fact sheets you were given: _____________________________________________________________________             [facility] - Preparing for Surgery Before surgery, you can play an important role.  Because skin is not sterile, your skin needs to be as free of germs as possible.  You can reduce the number of germs on your skin by washing with CHG (chlorahexidine gluconate) soap before surgery.  CHG is an antiseptic cleaner which kills germs and bonds with the skin to continue killing germs even after washing. Please DO NOT use if you have an allergy to CHG or antibacterial soaps.  If your skin becomes reddened/irritated stop using the CHG and inform your nurse when you arrive at Short Stay. Do not shave (including legs and underarms) for at least 48 hours prior to the first CHG shower.  You may shave your face/neck. Please follow these instructions carefully:  1.  Shower with CHG Soap the night before surgery and the  morning of Surgery.  2.  If you choose to wash your hair, wash your hair first as usual with your  normal  shampoo.  3.  After you shampoo, rinse your hair and body thoroughly to remove the  shampoo.                           4.  Use CHG as you would any other liquid soap.  You can  apply chg directly  to the skin and wash                       Gently with a scrungie or clean washcloth.  5.  Apply the CHG Soap to your body ONLY FROM THE NECK DOWN.   Do not use on face/ open                           Wound or open sores. Avoid contact with eyes, ears  mouth and genitals (private parts).                       Wash face,  Genitals (private parts) with your normal soap.             6.  Wash thoroughly, paying special attention to the area where your surgery  will be performed.  7.  Thoroughly rinse your body with warm water from the neck down.  8.  DO NOT shower/wash with your normal soap after using and rinsing off  the CHG Soap.                9.  Pat yourself dry with a clean towel.            10.  Wear clean pajamas.            11.  Place clean sheets on your bed the night of your first shower and do not  sleep with pets. Day of Surgery : Do not apply any lotions/deodorants the morning of surgery.  Please wear clean clothes to the hospital/surgery center.  FAILURE TO FOLLOW THESE INSTRUCTIONS MAY RESULT IN THE CANCELLATION OF YOUR SURGERY PATIENT SIGNATURE_________________________________  NURSE SIGNATURE__________________________________  ________________________________________________________________________   Adam Phenix  An incentive spirometer is a tool that can help keep your lungs clear and active. This tool measures how well you are filling your lungs with each breath. Taking long deep breaths may help reverse or decrease the chance of developing breathing (pulmonary) problems (especially infection) following:  A long period of time when you are unable to move or be active. BEFORE THE PROCEDURE   If the spirometer includes an indicator to show your best effort, your nurse or respiratory therapist will set it to a desired goal.  If possible, sit up straight or lean slightly forward. Try not to slouch.  Hold the incentive spirometer in an upright position. INSTRUCTIONS FOR USE  1. Sit on the edge of your bed if possible, or sit up as far as you can in bed or on a chair. 2. Hold the incentive spirometer in an upright position. 3. Breathe out normally. 4. Place the mouthpiece in your mouth and seal your lips  tightly around it. 5. Breathe in slowly and as deeply as possible, raising the piston or the ball toward the top of the column. 6. Hold your breath for 3-5 seconds or for as long as possible. Allow the piston or ball to fall to the bottom of the column. 7. Remove the mouthpiece from your mouth and breathe out normally. 8. Rest for a few seconds and repeat Steps 1 through 7 at least 10 times every 1-2 hours when you are awake. Take your time and take a few normal breaths between deep breaths. 9. The spirometer may include an indicator to show your best effort. Use the indicator as a goal to work toward during  each repetition. 10. After each set of 10 deep breaths, practice coughing to be sure your lungs are clear. If you have an incision (the cut made at the time of surgery), support your incision when coughing by placing a pillow or rolled up towels firmly against it. Once you are able to get out of bed, walk around indoors and cough well. You may stop using the incentive spirometer when instructed by your caregiver.  RISKS AND COMPLICATIONS  Take your time so you do not get dizzy or light-headed.  If you are in pain, you may need to take or ask for pain medication before doing incentive spirometry. It is harder to take a deep breath if you are having pain. AFTER USE  Rest and breathe slowly and easily.  It can be helpful to keep track of a log of your progress. Your caregiver can provide you with a simple table to help with this. If you are using the spirometer at home, follow these instructions: [place] IF:   You are having difficultly using the spirometer.  You have trouble using the spirometer as often as instructed.  Your pain medication is not giving enough relief while using the spirometer.  You develop fever of 100.5 F (38.1 C) or higher. SEEK IMMEDIATE MEDICAL CARE IF:   You cough up bloody sputum that had not been present before.  You develop fever of 102 F  (38.9 C) or greater.  You develop worsening pain at or near the incision site. MAKE SURE YOU:   Understand these instructions.  Will watch your condition.  Will get help right away if you are not doing well or get worse. Document Released: 07/12/2006 Document Revised: 05/24/2011 Document Reviewed: 09/12/2006 Exit[employer] Patient Information 2014 [employer], [place].   ________________________________________________________________________  WHAT IS A BLOOD TRANSFUSION? Blood Transfusion Information  A transfusion is the replacement of blood or some of its parts. Blood is made up of multiple cells which provide different functions.  Red blood cells carry oxygen and are used for blood loss replacement.  White blood cells fight against infection.  Platelets control bleeding.  Plasma helps clot blood.  Other blood products are available for specialized needs, such as hemophilia or other clotting disorders. BEFORE THE TRANSFUSION  Who gives blood for transfusions?   Healthy volunteers who are fully evaluated to make sure their blood is safe. This is blood bank blood. Transfusion therapy is the safest it has ever been in the practice of medicine. Before blood is taken from a donor, a complete history is taken to make sure that person has no history of diseases nor engages in risky social behavior (examples are intravenous drug use or sexual activity with multiple partners). The donor's travel history is screened to minimize risk of transmitting infections, such as malaria. The donated blood is tested for signs of infectious diseases, such as HIV and hepatitis. The blood is then tested to be sure it is compatible with you in order to minimize the chance of a transfusion reaction. If you or a relative donates blood, this is often done in anticipation of surgery and is not appropriate for emergency situations. It takes many days to process the donated blood. RISKS AND COMPLICATIONS Although  transfusion therapy is very safe and saves many lives, the main dangers of transfusion include:   Getting an infectious disease.  Developing a transfusion reaction. This is an allergic reaction to something in the blood you were given. Every precaution is taken to prevent  this. The decision to have a blood transfusion has been considered carefully by your caregiver before blood is given. Blood is not given unless the benefits outweigh the risks. AFTER THE TRANSFUSION  Right after receiving a blood transfusion, you will usually feel much better and more energetic. This is especially true if your red blood cells have gotten low (anemic). The transfusion raises the level of the red blood cells which carry oxygen, and this usually causes an energy increase.  The nurse administering the transfusion will monitor you carefully for complications. HOME CARE INSTRUCTIONS  No special instructions are needed after a transfusion. You may find your energy is better. Speak with your caregiver about any limitations on activity for underlying diseases you may have. SEEK MEDICAL CARE IF:   Your condition is not improving after your transfusion.  You develop redness or irritation at the intravenous (IV) site. SEEK IMMEDIATE MEDICAL CARE IF:  Any of the following symptoms occur over the next 12 hours:  Shaking chills.  You have a temperature by mouth above 102 F (38.9 C), not controlled by medicine.  Chest, back, or muscle pain.  People around you feel you are not acting correctly or are confused.  Shortness of breath or difficulty breathing.  Dizziness and fainting.  You get a rash or develop hives.  You have a decrease in urine output.  Your urine turns a dark color or changes to pink, red, or brown. Any of the following symptoms occur over the next 10 days:  You have a temperature by mouth above 102 F (38.9 C), not controlled by medicine.  Shortness of breath.  Weakness after normal  activity.  The white part of the eye turns yellow (jaundice).  You have a decrease in the amount of urine or are urinating less often.  Your urine turns a dark color or changes to pink, red, or brown. Document Released: 02/27/2000 Document Revised: 05/24/2011 Document Reviewed: 10/16/2007 [facility] Patient Information 2014 [place], [place].  _______________________________________________________________________

## 2018-02-24 NOTE — H&P (Signed)
TOTAL KNEE REVISION ADMISSION H&P  Patient is being admitted for right revision total knee arthroplasty.  Subjective:  Chief Complaint:right knee pain.  HPI: [name] is a pleasant 62 y/o female who presents for pre-operative visit in preparation for their right total knee arthroplasty revision, which is scheduled on 03/01/18 with Dr. Wynelle Link at [facility]. Her original left TKA was previously done in [place] over 14 years ago. She first presented to Dr. Anne Fu clinic with complaints of pain and instability. The right knee was noted to have significant anterior-posterior and lateral laxity on physical exam. She has failed conservative treatments including multiple arthroscopies for scar tissue debridement and physical therapy. The patient denies an active infection.  Patient Active Problem List   Diagnosis Date Noted  . Abdominal pain, epigastric 06/03/2017  . Constipation 01/27/2016  . History of adenomatous polyp of colon 01/27/2016   Past Medical History:  Diagnosis Date  . Arthritis    knee  . Dyslipidemia   . GERD (gastroesophageal reflux disease)     Past Surgical History:  Procedure Laterality Date  . ABDOMINAL HYSTERECTOMY    . BIOPSY  08/09/2017   Procedure: BIOPSY;  Surgeon: Danie Binder, MD;  Location: [facility];  Service: Endoscopy;;  gastric biopsy for h pylori  . COLONOSCOPY WITH ESOPHAGOGASTRODUODENOSCOPY (EGD)  2014   [place] N/A 08/09/2017   Procedure: COLONOSCOPY WITH PROPOFOL;  Surgeon: Danie Binder, MD;  Location: [facility];  Service: Endoscopy;  Laterality: N/A;  9:15am  . ESOPHAGOGASTRODUODENOSCOPY (EGD) WITH PROPOFOL N/A 08/09/2017   Procedure: ESOPHAGOGASTRODUODENOSCOPY (EGD) WITH PROPOFOL;  Surgeon: Danie Binder, MD;  Location: [facility];  Service: Endoscopy;  Laterality: N/A;  . KNEE SURGERY Right   . POLYPECTOMY  08/09/2017   Procedure: POLYPECTOMY;  Surgeon: Danie Binder, MD;  Location: [facility];  Service: Endoscopy;;  cecal polyp hs, transverse colon polyp hs, rectal polyp cs    No current facility-administered medications for this encounter.    Current Outpatient Medications  Medication Sig Dispense Refill Last Dose  . CALCIUM PO Take 1 tablet by mouth 2 (two) times daily.    Taking  . Cholecalciferol (VITAMIN D3) 125 MCG (5000 UT) CAPS Take 5,000 Units by mouth 2 (two) times daily.     Marland Kitchen estradiol (CLIMARA - DOSED IN MG/24 HR) 0.1 mg/24hr patch Place 0.1 mg onto the skin once a week.    Taking  . lansoprazole (PREVACID) 30 MG capsule 1 po 30 mins prior to first meal AND LAST MEAL (Patient taking differently: Take 30 mg by mouth 2 (two) times daily. ) 60 capsule 11   . linaclotide (LINZESS) 145 MCG CAPS capsule Take 1 capsule (145 mcg total) by mouth daily before breakfast. 90 capsule 3   . OVER THE COUNTER MEDICATION Take 1-2 capsules by mouth See admin instructions. Omega 3/Red Yeast Rice Take 1 capsule by mouth in the morning and take 2 capsules by mouth at bedtime      Allergies  Allergen Reactions  . Sulfa Antibiotics Anaphylaxis and Other (See Comments)    Tongue swelling     Social History   Tobacco Use  . Smoking status: Former Smoker    Types: Cigarettes    Last attempt to quit: 01/27/1971    Years since quitting: 47.1  . Smokeless tobacco: Never Used  Substance Use Topics  . Alcohol use: Yes    Comment: Occasional: 1-2 times/month  Family History  Problem Relation Age of Onset  . Alcohol abuse Mother   . Colon cancer Neg Hx   . Breast cancer Neg Hx       Review of Systems  Constitutional: Negative for chills and fever.  HENT: Negative for congestion, sore throat and tinnitus.   Eyes: Negative for double vision, photophobia and pain.  Respiratory: Negative for cough, shortness of breath and wheezing.   Cardiovascular: Negative for chest pain, palpitations and orthopnea.  Gastrointestinal: Negative for heartburn, nausea  and vomiting.  Genitourinary: Negative for dysuria, frequency and urgency.  Musculoskeletal: Positive for joint pain.  Neurological: Negative for dizziness, weakness and headaches.     Objective:  Physical Exam  Well nourished and well developed. General: Alert and oriented x3, cooperative and pleasant, no acute distress. Head: normocephalic, atraumatic, neck supple. Eyes: EOMI. Respiratory: breath sounds clear in all fields, no wheezing, rales, or rhonchi. Cardiovascular: Regular rate and rhythm, no murmurs, gallops or rubs.  Abdomen: non-tender to palpation and soft, normoactive bowel sounds. Musculoskeletal: Right knee exam: Nonantalgic gait and walking without assistance of a cane. No swelling. Range of motion: Full extension, with significant varus/valgus laxity in extension. There is significant AP laxity in flexion. Knee is stable. Calves soft and nontender. Motor function intact in LE. Strength 5/5 LE bilaterally. Neuro: Distal pulses 2+. Sensation to light touch intact in LE.  Vital signs in last 24 hours: Blood pressure: 118/76 mmHg Pulse: 60 bpm  Labs:  Estimated body mass index is 30.56 kg/m as calculated from the following:   Height as of 09/27/17: 5' 2.5" (1.588 m).   Weight as of 09/27/17: 77 kg.  Imaging Review There is evidence of loosening of the components of the right knee with associated instability. The bone quality appears to be adequate for age and reported activity level.   Preoperative templating of the joint replacement has been completed, documented, and submitted to the Operating Room personnel in order to optimize intra-operative equipment management.   Assessment/Plan:  End stage arthritis, right knee(s) with failed previous arthroplasty.   The patient history, physical examination, clinical judgment of the provider and imaging studies are consistent with end stage degenerative joint disease of the right knee(s), previous total knee  arthroplasty. Revision total knee arthroplasty is deemed medically necessary. The treatment options including medical management, injection therapy, arthroscopy and revision arthroplasty were discussed at length. The risks and benefits of revision total knee arthroplasty were presented and reviewed. The risks due to aseptic loosening, infection, stiffness, patella tracking problems, thromboembolic complications and other imponderables were discussed. The patient acknowledged the explanation, agreed to proceed with the plan and consent was signed. Patient is being admitted for inpatient treatment for surgery, pain control, PT, OT, prophylactic antibiotics, VTE prophylaxis, progressive ambulation and ADL's and discharge planning.The patient is planning to be discharged home.   Therapy Plans: outpatient therapy at [facility] in [place] Disposition: Home with husband Planned DVT Prophylaxis: Aspirin 325 mg BID DME needed: None PCP: Wende Neighbors, MD TXA: IV Allergies: Sulfa (tongue swelling) Anesthesia Concerns: None BMI: 30.6  - Patient was instructed on what medications to stop prior to surgery. - Follow-up visit in 2 weeks with Dr. Wynelle Link - Begin physical therapy following surgery - Pre-operative lab work as pre-surgical testing - Prescriptions will be provided in hospital at time of discharge  Theresa Duty, PA-C Orthopedic Surgery [facility] [place]

## 2018-02-27 ENCOUNTER — Encounter (HOSPITAL_COMMUNITY): Payer: Self-pay

## 2018-02-27 ENCOUNTER — Other Ambulatory Visit: Payer: Self-pay

## 2018-02-27 ENCOUNTER — Encounter (HOSPITAL_COMMUNITY)
Admission: RE | Admit: 2018-02-27 | Discharge: 2018-02-27 | Disposition: A | Discharge status: Home / Self Care | Payer: 59 | Source: Ambulatory Visit | Attending: Orthopedic Surgery | Admitting: Orthopedic Surgery

## 2018-02-27 DIAGNOSIS — M1711 Unilateral primary osteoarthritis, right knee: Secondary | ICD-10-CM | POA: Diagnosis not present

## 2018-02-27 DIAGNOSIS — Z79899 Other long term (current) drug therapy: Secondary | ICD-10-CM | POA: Diagnosis not present

## 2018-02-27 DIAGNOSIS — Z01812 Encounter for preprocedural laboratory examination: Secondary | ICD-10-CM

## 2018-02-27 DIAGNOSIS — E669 Obesity, unspecified: Secondary | ICD-10-CM | POA: Diagnosis not present

## 2018-02-27 DIAGNOSIS — Y831 Surgical operation with implant of artificial internal device as the cause of abnormal reaction of the patient, or of later complication, without mention of misadventure at the time of the procedure: Secondary | ICD-10-CM | POA: Diagnosis not present

## 2018-02-27 DIAGNOSIS — T84032A Mechanical loosening of internal right knee prosthetic joint, initial encounter: Secondary | ICD-10-CM | POA: Diagnosis not present

## 2018-02-27 DIAGNOSIS — Z6831 Body mass index (BMI) 31.0-31.9, adult: Secondary | ICD-10-CM | POA: Diagnosis not present

## 2018-02-27 DIAGNOSIS — M25561 Pain in right knee: Secondary | ICD-10-CM | POA: Diagnosis present

## 2018-02-27 DIAGNOSIS — Z79890 Hormone replacement therapy (postmenopausal): Secondary | ICD-10-CM | POA: Diagnosis not present

## 2018-02-27 DIAGNOSIS — Z87891 Personal history of nicotine dependence: Secondary | ICD-10-CM | POA: Diagnosis not present

## 2018-02-27 DIAGNOSIS — K219 Gastro-esophageal reflux disease without esophagitis: Secondary | ICD-10-CM | POA: Diagnosis not present

## 2018-02-27 HISTORY — DX: Left anterior fascicular block: I44.4

## 2018-02-27 LAB — CBC
HCT: 44.6 % (ref 36.0–46.0)
Hemoglobin: 14 g/dL (ref 12.0–15.0)
MCH: 29.5 pg (ref 26.0–34.0)
MCHC: 31.4 g/dL (ref 30.0–36.0)
MCV: 93.9 fL (ref 80.0–100.0)
Platelets: 240 10*3/uL (ref 150–400)
RBC: 4.75 MIL/uL (ref 3.87–5.11)
RDW: 13.7 % (ref 11.5–15.5)
WBC: 5.1 10*3/uL (ref 4.0–10.5)
nRBC: 0 % (ref 0.0–0.2)

## 2018-02-27 LAB — APTT: APTT: 40 s — AB (ref 24–36)

## 2018-02-27 LAB — COMPREHENSIVE METABOLIC PANEL
ALT: 19 U/L (ref 0–44)
ANION GAP: 8 (ref 5–15)
AST: 23 U/L (ref 15–41)
Albumin: 3.9 g/dL (ref 3.5–5.0)
Alkaline Phosphatase: 44 U/L (ref 38–126)
BUN: 16 mg/dL (ref 8–23)
CO2: 24 mmol/L (ref 22–32)
Calcium: 8.9 mg/dL (ref 8.9–10.3)
Chloride: 107 mmol/L (ref 98–111)
Creatinine, Ser: 0.81 mg/dL (ref 0.44–1.00)
GFR calc Af Amer: >60 mL/min (ref >60)
GFR calc non Af Amer: >60 mL/min (ref >60)
Glucose, Bld: 93 mg/dL (ref 70–99)
POTASSIUM: 4.3 mmol/L (ref 3.5–5.1)
SODIUM: 139 mmol/L (ref 135–145)
TOTAL PROTEIN: 6.7 g/dL (ref 6.5–8.1)
Total Bilirubin: 0.6 mg/dL (ref 0.3–1.2)

## 2018-02-27 LAB — PROTIME-INR
INR: 0.92
Prothrombin Time: 12.3 seconds (ref 11.4–15.2)

## 2018-02-27 LAB — SURGICAL PCR SCREEN
MRSA, PCR: NEGATIVE
Staphylococcus aureus: NEGATIVE

## 2018-02-28 DIAGNOSIS — S134XXA Sprain of ligaments of cervical spine, initial encounter: Secondary | ICD-10-CM | POA: Diagnosis not present

## 2018-02-28 DIAGNOSIS — M546 Pain in thoracic spine: Secondary | ICD-10-CM | POA: Diagnosis not present

## 2018-02-28 DIAGNOSIS — M47816 Spondylosis without myelopathy or radiculopathy, lumbar region: Secondary | ICD-10-CM | POA: Diagnosis not present

## 2018-02-28 LAB — ABO/RH: ABO/RH(D): A NEG

## 2018-02-28 MED ORDER — BUPIVACAINE LIPOSOME 1.3 % IJ SUSP
20.0000 mL | INTRAMUSCULAR | Status: DC
  Filled 2018-02-28: qty 20

## 2018-03-01 ENCOUNTER — Observation Stay (HOSPITAL_COMMUNITY)
Admission: RE | Admit: 2018-03-01 | Discharge: 2018-03-02 | Disposition: A | Discharge status: Home / Self Care | Payer: 59 | Attending: Orthopedic Surgery | Admitting: Orthopedic Surgery

## 2018-03-01 ENCOUNTER — Ambulatory Visit (HOSPITAL_COMMUNITY): Payer: 59 | Admitting: Registered Nurse

## 2018-03-01 ENCOUNTER — Encounter (HOSPITAL_COMMUNITY)
Admission: RE | Disposition: A | Discharge status: Home / Self Care | Payer: Self-pay | Source: Home / Self Care | Attending: Orthopedic Surgery

## 2018-03-01 ENCOUNTER — Encounter (HOSPITAL_COMMUNITY): Payer: Self-pay | Admitting: *Deleted

## 2018-03-01 ENCOUNTER — Other Ambulatory Visit: Payer: Self-pay

## 2018-03-01 DIAGNOSIS — Z6831 Body mass index (BMI) 31.0-31.9, adult: Secondary | ICD-10-CM | POA: Insufficient documentation

## 2018-03-01 DIAGNOSIS — Z96659 Presence of unspecified artificial knee joint: Secondary | ICD-10-CM

## 2018-03-01 DIAGNOSIS — Z79899 Other long term (current) drug therapy: Secondary | ICD-10-CM | POA: Insufficient documentation

## 2018-03-01 DIAGNOSIS — G8918 Other acute postprocedural pain: Secondary | ICD-10-CM | POA: Diagnosis not present

## 2018-03-01 DIAGNOSIS — M1711 Unilateral primary osteoarthritis, right knee: Secondary | ICD-10-CM | POA: Insufficient documentation

## 2018-03-01 DIAGNOSIS — Z79890 Hormone replacement therapy (postmenopausal): Secondary | ICD-10-CM | POA: Insufficient documentation

## 2018-03-01 DIAGNOSIS — T84032A Mechanical loosening of internal right knee prosthetic joint, initial encounter: Secondary | ICD-10-CM | POA: Diagnosis not present

## 2018-03-01 DIAGNOSIS — T84022A Instability of internal right knee prosthesis, initial encounter: Secondary | ICD-10-CM | POA: Diagnosis not present

## 2018-03-01 DIAGNOSIS — Y831 Surgical operation with implant of artificial internal device as the cause of abnormal reaction of the patient, or of later complication, without mention of misadventure at the time of the procedure: Secondary | ICD-10-CM | POA: Insufficient documentation

## 2018-03-01 DIAGNOSIS — Z87891 Personal history of nicotine dependence: Secondary | ICD-10-CM | POA: Insufficient documentation

## 2018-03-01 DIAGNOSIS — E669 Obesity, unspecified: Secondary | ICD-10-CM | POA: Insufficient documentation

## 2018-03-01 DIAGNOSIS — T84018A Broken internal joint prosthesis, other site, initial encounter: Secondary | ICD-10-CM

## 2018-03-01 DIAGNOSIS — K219 Gastro-esophageal reflux disease without esophagitis: Secondary | ICD-10-CM | POA: Diagnosis not present

## 2018-03-01 DIAGNOSIS — Z96651 Presence of right artificial knee joint: Secondary | ICD-10-CM | POA: Diagnosis not present

## 2018-03-01 HISTORY — PX: TOTAL KNEE REVISION: SHX996

## 2018-03-01 LAB — TYPE AND SCREEN
ABO/RH(D): A NEG
ANTIBODY SCREEN: NEGATIVE

## 2018-03-01 SURGERY — TOTAL KNEE REVISION
Anesthesia: Spinal | Site: Knee | Laterality: Right

## 2018-03-01 MED ORDER — PROPOFOL 10 MG/ML IV BOLUS
INTRAVENOUS | Status: AC
  Filled 2018-03-01: qty 60

## 2018-03-01 MED ORDER — TRANEXAMIC ACID-NACL 1000-0.7 MG/100ML-% IV SOLN
1000.0000 mg | INTRAVENOUS | Status: AC
  Administered 2018-03-01: 1000 mg via INTRAVENOUS
  Filled 2018-03-01: qty 100

## 2018-03-01 MED ORDER — TRANEXAMIC ACID-NACL 1000-0.7 MG/100ML-% IV SOLN
1000.0000 mg | Freq: Once | INTRAVENOUS | Status: AC
  Administered 2018-03-01: 1000 mg via INTRAVENOUS
  Filled 2018-03-01: qty 100

## 2018-03-01 MED ORDER — PROPOFOL 500 MG/50ML IV EMUL
INTRAVENOUS | Status: DC | PRN
  Administered 2018-03-01: 100 ug/kg/min via INTRAVENOUS

## 2018-03-01 MED ORDER — CEFAZOLIN SODIUM-DEXTROSE 2-4 GM/100ML-% IV SOLN
2.0000 g | INTRAVENOUS | Status: AC
  Administered 2018-03-01: 2 g via INTRAVENOUS
  Filled 2018-03-01: qty 100

## 2018-03-01 MED ORDER — DOCUSATE SODIUM 100 MG PO CAPS
100.0000 mg | ORAL_CAPSULE | Freq: Two times a day (BID) | ORAL | Status: DC
  Administered 2018-03-01 – 2018-03-02 (×2): 100 mg via ORAL
  Filled 2018-03-01 (×2): qty 1

## 2018-03-01 MED ORDER — FENTANYL CITRATE (PF) 100 MCG/2ML IJ SOLN
INTRAMUSCULAR | Status: AC
  Filled 2018-03-01: qty 2

## 2018-03-01 MED ORDER — SODIUM CHLORIDE (PF) 0.9 % IJ SOLN
INTRAMUSCULAR | Status: AC
  Filled 2018-03-01: qty 50

## 2018-03-01 MED ORDER — PHENOL 1.4 % MT LIQD: 1.0000 | OROMUCOSAL | Status: DC | PRN

## 2018-03-01 MED ORDER — FLEET ENEMA 7-19 GM/118ML RE ENEM: 1.0000 | ENEMA | Freq: Once | RECTAL | Status: DC | PRN

## 2018-03-01 MED ORDER — BUPIVACAINE LIPOSOME 1.3 % IJ SUSP
INTRAMUSCULAR | Status: DC | PRN
  Administered 2018-03-01: 20 mL

## 2018-03-01 MED ORDER — CLONIDINE HCL (ANALGESIA) 100 MCG/ML EP SOLN
EPIDURAL | Status: DC | PRN
  Administered 2018-03-01: 100 ug

## 2018-03-01 MED ORDER — SODIUM CHLORIDE 0.9 % IV SOLN
INTRAVENOUS | Status: DC
  Administered 2018-03-01 – 2018-03-02 (×2): via INTRAVENOUS

## 2018-03-01 MED ORDER — TRAMADOL HCL 50 MG PO TABS
50.0000 mg | ORAL_TABLET | Freq: Four times a day (QID) | ORAL | Status: DC | PRN
  Administered 2018-03-01: 100 mg via ORAL
  Filled 2018-03-01: qty 2

## 2018-03-01 MED ORDER — DIPHENHYDRAMINE HCL 12.5 MG/5ML PO ELIX: 12.5000 mg | ORAL_SOLUTION | ORAL | Status: DC | PRN

## 2018-03-01 MED ORDER — DEXAMETHASONE SODIUM PHOSPHATE 10 MG/ML IJ SOLN
8.0000 mg | Freq: Once | INTRAMUSCULAR | Status: AC
  Administered 2018-03-01: 10 mg via INTRAVENOUS

## 2018-03-01 MED ORDER — METHOCARBAMOL 500 MG IVPB - SIMPLE MED
INTRAVENOUS | Status: AC
  Filled 2018-03-01: qty 50

## 2018-03-01 MED ORDER — BISACODYL 10 MG RE SUPP: 10.0000 mg | Freq: Every day | RECTAL | Status: DC | PRN

## 2018-03-01 MED ORDER — ROPIVACAINE HCL 7.5 MG/ML IJ SOLN
INTRAMUSCULAR | Status: DC | PRN
  Administered 2018-03-01: 20 mL via PERINEURAL

## 2018-03-01 MED ORDER — POLYETHYLENE GLYCOL 3350 17 G PO PACK: 17.0000 g | PACK | Freq: Every day | ORAL | Status: DC | PRN

## 2018-03-01 MED ORDER — ACETAMINOPHEN 10 MG/ML IV SOLN
1000.0000 mg | Freq: Four times a day (QID) | INTRAVENOUS | Status: DC
  Administered 2018-03-01: 1000 mg via INTRAVENOUS
  Filled 2018-03-01: qty 100

## 2018-03-01 MED ORDER — PROPOFOL 10 MG/ML IV BOLUS
INTRAVENOUS | Status: AC
  Filled 2018-03-01: qty 20

## 2018-03-01 MED ORDER — ACETAMINOPHEN 500 MG PO TABS
1000.0000 mg | ORAL_TABLET | Freq: Four times a day (QID) | ORAL | Status: DC
  Administered 2018-03-01 – 2018-03-02 (×3): 1000 mg via ORAL
  Filled 2018-03-01 (×3): qty 2

## 2018-03-01 MED ORDER — ASPIRIN EC 325 MG PO TBEC
325.0000 mg | DELAYED_RELEASE_TABLET | Freq: Two times a day (BID) | ORAL | Status: DC
  Administered 2018-03-02: 325 mg via ORAL
  Filled 2018-03-01: qty 1

## 2018-03-01 MED ORDER — METOCLOPRAMIDE HCL 5 MG/ML IJ SOLN: 5.0000 mg | Freq: Three times a day (TID) | INTRAMUSCULAR | Status: DC | PRN

## 2018-03-01 MED ORDER — FENTANYL CITRATE (PF) 100 MCG/2ML IJ SOLN
50.0000 ug | INTRAMUSCULAR | Status: DC
  Administered 2018-03-01: 100 ug via INTRAVENOUS
  Administered 2018-03-01 (×2): 50 ug via INTRAVENOUS
  Filled 2018-03-01: qty 2

## 2018-03-01 MED ORDER — GLYCOPYRROLATE 0.2 MG/ML IJ SOLN
INTRAMUSCULAR | Status: DC | PRN
  Administered 2018-03-01: 0.2 mg via INTRAVENOUS

## 2018-03-01 MED ORDER — ONDANSETRON HCL 4 MG/2ML IJ SOLN: 4.0000 mg | Freq: Four times a day (QID) | INTRAMUSCULAR | Status: DC | PRN

## 2018-03-01 MED ORDER — ONDANSETRON HCL 4 MG/2ML IJ SOLN
INTRAMUSCULAR | Status: DC | PRN
  Administered 2018-03-01: 4 mg via INTRAVENOUS

## 2018-03-01 MED ORDER — MIDAZOLAM HCL 2 MG/2ML IJ SOLN
INTRAMUSCULAR | Status: AC
  Filled 2018-03-01: qty 2

## 2018-03-01 MED ORDER — PHENYLEPHRINE HCL 10 MG/ML IJ SOLN
INTRAMUSCULAR | Status: AC
  Filled 2018-03-01: qty 1

## 2018-03-01 MED ORDER — CHLORHEXIDINE GLUCONATE 4 % EX LIQD: 60.0000 mL | Freq: Once | CUTANEOUS | Status: DC

## 2018-03-01 MED ORDER — OXYCODONE HCL 5 MG PO TABS
5.0000 mg | ORAL_TABLET | ORAL | Status: DC | PRN
  Administered 2018-03-01 (×2): 10 mg via ORAL
  Administered 2018-03-02: 5 mg via ORAL
  Administered 2018-03-02 (×2): 10 mg via ORAL
  Filled 2018-03-01: qty 1
  Filled 2018-03-01 (×4): qty 2

## 2018-03-01 MED ORDER — SODIUM CHLORIDE (PF) 0.9 % IJ SOLN
INTRAMUSCULAR | Status: DC | PRN
  Administered 2018-03-01: 60 mL

## 2018-03-01 MED ORDER — METHOCARBAMOL 500 MG IVPB - SIMPLE MED
500.0000 mg | Freq: Four times a day (QID) | INTRAVENOUS | Status: DC | PRN
  Administered 2018-03-01: 500 mg via INTRAVENOUS
  Filled 2018-03-01: qty 50

## 2018-03-01 MED ORDER — GABAPENTIN 300 MG PO CAPS
300.0000 mg | ORAL_CAPSULE | Freq: Once | ORAL | Status: AC
  Administered 2018-03-01: 300 mg via ORAL
  Filled 2018-03-01: qty 1

## 2018-03-01 MED ORDER — METHOCARBAMOL 500 MG PO TABS
500.0000 mg | ORAL_TABLET | Freq: Four times a day (QID) | ORAL | Status: DC | PRN
  Administered 2018-03-02: 500 mg via ORAL
  Filled 2018-03-01: qty 1

## 2018-03-01 MED ORDER — MIDAZOLAM HCL 5 MG/5ML IJ SOLN
INTRAMUSCULAR | Status: DC | PRN
  Administered 2018-03-01: 2 mg via INTRAVENOUS

## 2018-03-01 MED ORDER — STERILE WATER FOR IRRIGATION IR SOLN
Status: DC | PRN
  Administered 2018-03-01: 1500 mL

## 2018-03-01 MED ORDER — ONDANSETRON HCL 4 MG PO TABS: 4.0000 mg | ORAL_TABLET | Freq: Four times a day (QID) | ORAL | Status: DC | PRN

## 2018-03-01 MED ORDER — GLYCOPYRROLATE PF 0.2 MG/ML IJ SOSY
PREFILLED_SYRINGE | INTRAMUSCULAR | Status: AC
  Filled 2018-03-01: qty 1

## 2018-03-01 MED ORDER — 0.9 % SODIUM CHLORIDE (POUR BTL) OPTIME
TOPICAL | Status: DC | PRN
  Administered 2018-03-01: 1000 mL

## 2018-03-01 MED ORDER — PANTOPRAZOLE SODIUM 40 MG PO TBEC
40.0000 mg | DELAYED_RELEASE_TABLET | Freq: Two times a day (BID) | ORAL | Status: DC
  Administered 2018-03-01 – 2018-03-02 (×2): 40 mg via ORAL
  Filled 2018-03-01 (×2): qty 1

## 2018-03-01 MED ORDER — MIDAZOLAM HCL 2 MG/2ML IJ SOLN
1.0000 mg | INTRAMUSCULAR | Status: DC
  Administered 2018-03-01: 2 mg via INTRAVENOUS
  Filled 2018-03-01: qty 2

## 2018-03-01 MED ORDER — LINACLOTIDE 145 MCG PO CAPS
145.0000 ug | ORAL_CAPSULE | Freq: Every day | ORAL | Status: DC
  Filled 2018-03-01: qty 1

## 2018-03-01 MED ORDER — DEXAMETHASONE SODIUM PHOSPHATE 10 MG/ML IJ SOLN
INTRAMUSCULAR | Status: AC
  Filled 2018-03-01: qty 1

## 2018-03-01 MED ORDER — CEFAZOLIN SODIUM-DEXTROSE 2-4 GM/100ML-% IV SOLN
2.0000 g | Freq: Four times a day (QID) | INTRAVENOUS | Status: AC
  Administered 2018-03-01 – 2018-03-02 (×2): 2 g via INTRAVENOUS
  Filled 2018-03-01 (×2): qty 100

## 2018-03-01 MED ORDER — MENTHOL 3 MG MT LOZG: 1.0000 | LOZENGE | OROMUCOSAL | Status: DC | PRN

## 2018-03-01 MED ORDER — MORPHINE SULFATE (PF) 2 MG/ML IV SOLN: 1.0000 mg | INTRAVENOUS | Status: DC | PRN

## 2018-03-01 MED ORDER — SODIUM CHLORIDE 0.9 % IV SOLN
INTRAVENOUS | Status: DC | PRN
  Administered 2018-03-01: 25 ug/min via INTRAVENOUS

## 2018-03-01 MED ORDER — FENTANYL CITRATE (PF) 100 MCG/2ML IJ SOLN
INTRAMUSCULAR | Status: DC | PRN
  Administered 2018-03-01 (×2): 50 ug via INTRAVENOUS

## 2018-03-01 MED ORDER — LACTATED RINGERS IV SOLN
INTRAVENOUS | Status: DC
  Administered 2018-03-01 (×3): via INTRAVENOUS

## 2018-03-01 MED ORDER — METOCLOPRAMIDE HCL 5 MG PO TABS: 5.0000 mg | ORAL_TABLET | Freq: Three times a day (TID) | ORAL | Status: DC | PRN

## 2018-03-01 MED ORDER — DEXAMETHASONE SODIUM PHOSPHATE 10 MG/ML IJ SOLN
10.0000 mg | Freq: Once | INTRAMUSCULAR | Status: AC
  Administered 2018-03-02: 10 mg via INTRAVENOUS
  Filled 2018-03-01: qty 1

## 2018-03-01 MED ORDER — BUPIVACAINE IN DEXTROSE 0.75-8.25 % IT SOLN
INTRATHECAL | Status: DC | PRN
  Administered 2018-03-01: 1.6 mL via INTRATHECAL

## 2018-03-01 MED ORDER — SODIUM CHLORIDE (PF) 0.9 % IJ SOLN
INTRAMUSCULAR | Status: AC
  Filled 2018-03-01: qty 10

## 2018-03-01 MED ORDER — ONDANSETRON HCL 4 MG/2ML IJ SOLN
INTRAMUSCULAR | Status: AC
  Filled 2018-03-01: qty 2

## 2018-03-01 SURGICAL SUPPLY — 71 items
ADAPTER BOLT FEMORAL +2/-2 (Knees) ×3 IMPLANT
AUGMENT DIST PFC 4MM SZ 2.5 RT (Knees) ×2 IMPLANT
BAG DECANTER FOR FLEXI CONT (MISCELLANEOUS) ×3 IMPLANT
BAG ZIPLOCK 12X15 (MISCELLANEOUS) IMPLANT
BANDAGE ACE 6X5 VEL STRL LF (GAUZE/BANDAGES/DRESSINGS) ×3 IMPLANT
BLADE SAG 18X100X1.27 (BLADE) ×3 IMPLANT
BLADE SAW SGTL 11.0X1.19X90.0M (BLADE) ×3 IMPLANT
BLADE SURG SZ10 CARB STEEL (BLADE) ×6 IMPLANT
BNDG ELASTIC 6X10 VLCR STRL LF (GAUZE/BANDAGES/DRESSINGS) ×3 IMPLANT
BONE CEMENT GENTAMICIN (Cement) ×9 IMPLANT
CEMENT BONE GENTAMICIN 40 (Cement) ×3 IMPLANT
CLOSURE WOUND 1/2 X4 (GAUZE/BANDAGES/DRESSINGS) ×1
CLOTH BEACON ORANGE TIMEOUT ST (SAFETY) ×3 IMPLANT
COVER SURGICAL LIGHT HANDLE (MISCELLANEOUS) ×3 IMPLANT
COVER WAND RF STERILE (DRAPES) ×3 IMPLANT
CUFF TOURN SGL QUICK 34 (TOURNIQUET CUFF) ×2
CUFF TRNQT CYL 34X4X40X1 (TOURNIQUET CUFF) ×1 IMPLANT
DECANTER SPIKE VIAL GLASS SM (MISCELLANEOUS) IMPLANT
DISAL AUG PFC 4MM SZ 2.5 RT (Knees) ×6 IMPLANT
DRAPE U-SHAPE 47X51 STRL (DRAPES) ×3 IMPLANT
DRSG ADAPTIC 3X8 NADH LF (GAUZE/BANDAGES/DRESSINGS) ×3 IMPLANT
DRSG PAD ABDOMINAL 8X10 ST (GAUZE/BANDAGES/DRESSINGS) ×3 IMPLANT
DURAPREP 26ML APPLICATOR (WOUND CARE) ×3 IMPLANT
ELECT REM PT RETURN 15FT ADLT (MISCELLANEOUS) ×3 IMPLANT
EVACUATOR 1/8 PVC DRAIN (DRAIN) ×3 IMPLANT
FEM TC3 RT PFC SIGMA SZ2.5 (Orthopedic Implant) ×3 IMPLANT
FEMORAL ADAPTER (Orthopedic Implant) ×3 IMPLANT
FEMORAL TC3 RT PFC SIGMA SZ2.5 (Orthopedic Implant) ×1 IMPLANT
GAUZE SPONGE 4X4 12PLY STRL (GAUZE/BANDAGES/DRESSINGS) ×3 IMPLANT
GLOVE BIO SURGEON STRL SZ7 (GLOVE) ×3 IMPLANT
GLOVE BIO SURGEON STRL SZ8 (GLOVE) ×3 IMPLANT
GLOVE BIOGEL PI IND STRL 7.0 (GLOVE) ×1 IMPLANT
GLOVE BIOGEL PI IND STRL 8 (GLOVE) ×1 IMPLANT
GLOVE BIOGEL PI INDICATOR 7.0 (GLOVE) ×2
GLOVE BIOGEL PI INDICATOR 8 (GLOVE) ×2
GOWN STRL REUS W/TWL LRG LVL3 (GOWN DISPOSABLE) ×3 IMPLANT
GOWN STRL REUS W/TWL XL LVL3 (GOWN DISPOSABLE) ×3 IMPLANT
HANDPIECE INTERPULSE COAX TIP (DISPOSABLE) ×2
HOLDER FOLEY CATH W/STRAP (MISCELLANEOUS) ×3 IMPLANT
IMMOBILIZER KNEE 20 (SOFTGOODS) ×3
IMMOBILIZER KNEE 20 THIGH 36 (SOFTGOODS) ×1 IMPLANT
INSERT TIBIAL TC3 SZ 2.5 12.5 (Knees) ×3 IMPLANT
MANIFOLD NEPTUNE II (INSTRUMENTS) ×3 IMPLANT
NS IRRIG 1000ML POUR BTL (IV SOLUTION) ×3 IMPLANT
PACK TOTAL KNEE CUSTOM (KITS) ×3 IMPLANT
PAD ABD 8X10 STRL (GAUZE/BANDAGES/DRESSINGS) ×3 IMPLANT
PADDING CAST COTTON 6X4 STRL (CAST SUPPLIES) ×3 IMPLANT
PIN STEINMAN FIXATION KNEE (PIN) ×3 IMPLANT
POST AUG PFC 4MM SZ 2.5 (Knees) ×3 IMPLANT
PROTECTOR NERVE ULNAR (MISCELLANEOUS) ×3 IMPLANT
RESTRICTOR CEMENT SZ 5 C-STEM (Cement) ×3 IMPLANT
SET HNDPC FAN SPRY TIP SCT (DISPOSABLE) ×1 IMPLANT
STEM TIBIA PFC 13X30MM (Stem) ×3 IMPLANT
STEM UNIVERSAL REVISION 75X16 (Stem) ×3 IMPLANT
STRIP CLOSURE SKIN 1/2X4 (GAUZE/BANDAGES/DRESSINGS) ×2 IMPLANT
SUT MNCRL AB 4-0 PS2 18 (SUTURE) ×3 IMPLANT
SUT STRATAFIX 0 PDS 27 VIOLET (SUTURE) ×3
SUT VIC AB 2-0 CT1 27 (SUTURE) ×6
SUT VIC AB 2-0 CT1 TAPERPNT 27 (SUTURE) ×3 IMPLANT
SUTURE STRATFX 0 PDS 27 VIOLET (SUTURE) ×1 IMPLANT
SWAB COLLECTION DEVICE MRSA (MISCELLANEOUS) IMPLANT
SWAB CULTURE ESWAB REG 1ML (MISCELLANEOUS) IMPLANT
SYR 50ML LL SCALE MARK (SYRINGE) ×6 IMPLANT
TOWER CARTRIDGE SMART MIX (DISPOSABLE) ×3 IMPLANT
TRAY FOLEY MTR SLVR 16FR STAT (SET/KITS/TRAYS/PACK) ×3 IMPLANT
TRAY REVISION SZ 2.5 (Knees) ×3 IMPLANT
TRAY SLEEVE CEM ML (Knees) ×3 IMPLANT
TUBE KAMVAC SUCTION (TUBING) IMPLANT
WATER STERILE IRR 1000ML POUR (IV SOLUTION) ×3 IMPLANT
WEDGE SZ 2.0MM 5MM (Knees) ×6 IMPLANT
WRAP KNEE MAXI GEL POST OP (GAUZE/BANDAGES/DRESSINGS) ×3 IMPLANT

## 2018-03-01 NOTE — Discharge Instructions (Signed)
° °Dr. Frank Aluisio °Total Joint Specialist °[facility] °[address] °[place], [place] [address] °[phone] ° °TOTAL KNEE REPLACEMENT POSTOPERATIVE DIRECTIONS ° °Knee Rehabilitation, Guidelines Following Surgery  °Results after knee surgery are often greatly improved when you follow the exercise, range of motion and muscle strengthening exercises prescribed by your doctor. Safety measures are also important to protect the knee from further injury. Any time any of these exercises cause you to have increased pain or swelling in your knee joint, decrease the amount until you are comfortable again and slowly increase them. If you have problems or questions, call your caregiver or physical therapist for advice.  ° °HOME CARE INSTRUCTIONS  °• Remove items at home which could result in a fall. This includes throw rugs or furniture in walking pathways.  °· ICE to the affected knee every three hours for 30 minutes at a time and then as needed for pain and swelling.  Continue to use ice on the knee for pain and swelling from surgery. You may notice swelling that will progress down to the foot and ankle.  This is normal after surgery.  Elevate the leg when you are not up walking on it.   °· Continue to use the breathing machine which will help keep your temperature down.  It is common for your temperature to cycle up and down following surgery, especially at night when you are not up moving around and exerting yourself.  The breathing machine keeps your lungs expanded and your temperature down. °· Do not place pillow under knee, focus on keeping the knee straight while resting ° °DIET °You may resume your previous home diet once your are discharged from the hospital. ° °DRESSING / WOUND CARE / SHOWERING °You may shower 3 days after surgery, but keep the wounds dry during showering.  You may use an occlusive plastic wrap (Press'n Seal for example), NO SOAKING/SUBMERGING IN THE BATHTUB.  If the bandage  gets wet, change with a clean dry gauze.  If the incision gets wet, pat the wound dry with a clean towel. °You may start showering once you are discharged home but do not submerge the incision under water. Just pat the incision dry and apply a dry gauze dressing on daily. °Change the surgical dressing daily and reapply a dry dressing each time. ° °ACTIVITY °Walk with your walker as instructed. °Use walker as long as suggested by your caregivers. °Avoid periods of inactivity such as sitting longer than an hour when not asleep. This helps prevent blood clots.  °You may resume a sexual relationship in one month or when given the OK by your doctor.  °You may return to work once you are cleared by your doctor.  °Do not drive a car for 6 weeks or until released by you surgeon.  °Do not drive while taking narcotics. ° °WEIGHT BEARING °Weight bearing as tolerated with assist device (walker, cane, etc) as directed, use it as long as suggested by your surgeon or therapist, typically at least 4-6 weeks. ° °POSTOPERATIVE CONSTIPATION PROTOCOL °Constipation - defined medically as fewer than three stools per week and severe constipation as less than one stool per week. ° °One of the most common issues patients have following surgery is constipation.  Even if you have a regular bowel pattern at home, your normal regimen is likely to be disrupted due to multiple reasons following surgery.  Combination of anesthesia, postoperative narcotics, change in appetite and fluid intake all can affect your bowels.    In order to avoid complications following surgery, here are some recommendations in order to help you during your recovery period. ° °Colace (docusate) - Pick up an over-the-counter form of Colace or another stool softener and take twice a day as long as you are requiring postoperative pain medications.  Take with a full glass of water daily.  If you experience loose stools or diarrhea, hold the colace until you stool forms back  up.  If your symptoms do not get better within 1 week or if they get worse, check with your doctor. ° °Dulcolax (bisacodyl) - Pick up over-the-counter and take as directed by the product packaging as needed to assist with the movement of your bowels.  Take with a full glass of water.  Use this product as needed if not relieved by Colace only.  ° °MiraLax (polyethylene glycol) - Pick up over-the-counter to have on hand.  MiraLax is a solution that will increase the amount of water in your bowels to assist with bowel movements.  Take as directed and can mix with a glass of water, juice, soda, coffee, or tea.  Take if you go more than two days without a movement. °Do not use MiraLax more than once per day. Call your doctor if you are still constipated or irregular after using this medication for 7 days in a row. ° °If you continue to have problems with postoperative constipation, please contact the office for further assistance and recommendations.  If you experience "the worst abdominal pain ever" or develop nausea or vomiting, please contact the office immediatly for further recommendations for treatment. ° °ITCHING ° If you experience itching with your medications, try taking only a single pain pill, or even half a pain pill at a time.  You can also use Benadryl over the counter for itching or also to help with sleep.  ° °TED HOSE STOCKINGS °Wear the elastic stockings on both legs for three weeks following surgery during the day but you may remove then at night for sleeping. ° °MEDICATIONS °See your medication summary on the “After Visit Summary” that the nursing staff will review with you prior to discharge.  You may have some home medications which will be placed on hold until you complete the course of blood thinner medication.  It is important for you to complete the blood thinner medication as prescribed by your surgeon.  Continue your approved medications as instructed at time of discharge. ° °PRECAUTIONS °If  you experience chest pain or shortness of breath - call 911 immediately for transfer to the hospital emergency department.  °If you develop a fever greater that 101 F, purulent drainage from wound, increased redness or drainage from wound, foul odor from the wound/dressing, or calf pain - CONTACT YOUR SURGEON.   °                                                °FOLLOW-UP APPOINTMENTS °Make sure you keep all of your appointments after your operation with your surgeon and caregivers. You should call the office at the above phone number and make an appointment for approximately two weeks after the date of your surgery or on the date instructed by your surgeon outlined in the "After Visit Summary". ° ° °RANGE OF MOTION AND STRENGTHENING EXERCISES  °Rehabilitation of the knee is important following a knee injury or   an operation. After just a few days of immobilization, the muscles of the thigh which control the knee become weakened and shrink (atrophy). Knee exercises are designed to build up the tone and strength of the thigh muscles and to improve knee motion. Often times heat used for twenty to thirty minutes before working out will loosen up your tissues and help with improving the range of motion but do not use heat for the first two weeks following surgery. These exercises can be done on a training (exercise) mat, on the floor, on a table or on a bed. Use what ever works the best and is most comfortable for you Knee exercises include:  °• Leg Lifts - While your knee is still immobilized in a splint or cast, you can do straight leg raises. Lift the leg to 60 degrees, hold for 3 sec, and slowly lower the leg. Repeat 10-20 times 2-3 times daily. Perform this exercise against resistance later as your knee gets better.  °• Quad and Hamstring Sets - Tighten up the muscle on the front of the thigh (Quad) and hold for 5-10 sec. Repeat this 10-20 times hourly. Hamstring sets are done by pushing the foot backward against an  object and holding for 5-10 sec. Repeat as with quad sets.  °· Leg Slides: Lying on your back, slowly slide your foot toward your buttocks, bending your knee up off the floor (only go as far as is comfortable). Then slowly slide your foot back down until your leg is flat on the floor again. °· Angel Wings: Lying on your back spread your legs to the side as far apart as you can without causing discomfort.  °A rehabilitation program following serious knee injuries can speed recovery and prevent re-injury in the future due to weakened muscles. Contact your doctor or a physical therapist for more information on knee rehabilitation.  ° °IF YOU ARE TRANSFERRED TO A SKILLED REHAB FACILITY °If the patient is transferred to a skilled rehab facility following release from the hospital, a list of the current medications will be sent to the facility for the patient to continue.  When discharged from the skilled rehab facility, please have the facility set up the patient's Home Health Physical Therapy prior to being released. Also, the skilled facility will be responsible for providing the patient with their medications at time of release from the facility to include their pain medication, the muscle relaxants, and their blood thinner medication. If the patient is still at the rehab facility at time of the two week follow up appointment, the skilled rehab facility will also need to assist the patient in arranging follow up appointment in our office and any transportation needs. ° °MAKE SURE YOU:  °• Understand these instructions.  °• Get help right away if you are not doing well or get worse.  ° ° °Pick up stool softner and laxative for home use following surgery while on pain medications. °Do not submerge incision under water. °Please use good hand washing techniques while changing dressing each day. °May shower starting three days after surgery. °Please use a clean towel to pat the incision dry following showers. °Continue to  use ice for pain and swelling after surgery. °Do not use any lotions or creams on the incision until instructed by your surgeon. ° °

## 2018-03-01 NOTE — Brief Op Note (Signed)
03/01/2018  2:24 PM  PATIENT:  Paula Sutton  62 y.o. female  PRE-OPERATIVE DIAGNOSIS:  loose right total knee arthroplasty  POST-OPERATIVE DIAGNOSIS:  loose right total knee arthroplasty   PROCEDURE:  Procedure(s) with comments: RIGHT TOTAL KNEE REVISION (Right) - 187min  SURGEON:  Surgeon(s) and Role:    Gaynelle Arabian, MD - Primary  PHYSICIAN ASSISTANT:   ASSISTANTS: Theresa Duty, PA-C   ANESTHESIA:   Adductor canal block and spinal  EBL:  100 mL   BLOOD ADMINISTERED:none  DRAINS: (Medium) Hemovact drain(s) in the right knee with  Suction Open   LOCAL MEDICATIONS USED:  OTHER Exparel  COUNTS:  YES  TOURNIQUET:   Total Tourniquet Time Documented: Thigh (Right) - 34 minutes Thigh (Right) - 22 minutes Total: Thigh (Right) - 56 minutes   DICTATION: .Other Dictation: Dictation Number [id number]  PLAN OF CARE: Admit to inpatient   PATIENT DISPOSITION:  PACU - hemodynamically stable.

## 2018-03-01 NOTE — Anesthesia Procedure Notes (Signed)
Procedure Name: [place] Performed by: Lissa Morales, CRNA Pre-anesthesia Checklist: Patient identified, Emergency Drugs available, Suction available, Timeout performed and Patient being monitored Patient Re-evaluated:Patient Re-evaluated prior to induction Oxygen Delivery Method: Simple face mask Placement Confirmation: positive ETCO2

## 2018-03-01 NOTE — Interval H&P Note (Signed)
History and Physical Interval Note:  03/01/2018 11:32 AM  Paula Sutton  has presented today for surgery, with the diagnosis of loose right total knee arthroplasty  The various methods of treatment have been discussed with the patient and family. After consideration of risks, benefits and other options for treatment, the patient has consented to  Procedure(s) with comments: RIGHT TOTAL KNEE REVISION (Right) - 136min as a surgical intervention .  The patient's history has been reviewed, patient examined, no change in status, stable for surgery.  I have reviewed the patient's chart and labs.  Questions were answered to the patient's satisfaction.     Pilar Plate Jarielys Girardot

## 2018-03-01 NOTE — Progress Notes (Signed)
Assisted Dr. Turk with right, ultrasound guided, adductor canal block. Side rails up, monitors on throughout procedure. See vital signs in flow sheet. Tolerated Procedure well.  

## 2018-03-01 NOTE — Anesthesia Procedure Notes (Signed)
Anesthesia Regional Block: Adductor canal block   Pre-Anesthetic Checklist: ,, timeout performed, Correct Patient, Correct Site, Correct Laterality, Correct Procedure, Correct Position, site marked, Risks and benefits discussed,  Surgical consent,  Pre-op evaluation,  At surgeon's request and post-op pain management  Laterality: Lower and Right  Prep: chloraprep       Needles:  Injection technique: Single-shot  Needle Type: Echogenic Stimulator Needle     Needle Length: 9cm  Needle Gauge: 21     Additional Needles:   Procedures:,,,, ultrasound used (permanent image in chart),,,,  Narrative:  Start time: 03/01/2018 11:51 AM End time: 03/01/2018 11:56 AM Injection made incrementally with aspirations every 5 mL.  Performed by: Personally  Anesthesiologist: Lyn Hollingshead, MD  Additional Notes: No pain on injection. No increased resistance to injection. Injection made in 5cc increments.  Good needle visualization.  Patient tolerated procedure well.

## 2018-03-01 NOTE — Anesthesia Procedure Notes (Signed)
Spinal  Patient location during procedure: OR End time: 03/01/2018 12:38 PM Staffing Resident/CRNA: Lissa Morales, CRNA Performed: resident/CRNA  Preanesthetic Checklist Completed: patient identified, site marked, surgical consent, pre-op evaluation, timeout performed, IV checked, risks and benefits discussed and monitors and equipment checked Spinal Block Patient position: sitting Prep: DuraPrep Patient monitoring: heart rate, continuous pulse ox and blood pressure Approach: right paramedian Location: L3-4 Injection technique: single-shot Needle Needle type: Pencan  Needle gauge: 24 G Needle length: 9 cm Additional Notes Expiration date of kit checked and confirmed. Patient tolerated procedure well, without complications.

## 2018-03-01 NOTE — Op Note (Signed)
NAME: Paula Sutton, Paula Sutton MEDICAL RECORD [id number] ACCOUNT [id number] DATE OF BIRTH:December 07, 1955 FACILITY: [facility] LOCATION: [facility] PHYSICIAN:Corbin Falck Zella Ball, MD  OPERATIVE REPORT  DATE OF PROCEDURE:  03/01/2018  PREOPERATIVE DIAGNOSIS:  Failed right total knee arthroplasty.  POSTOPERATIVE DIAGNOSIS:  Failed right total knee arthroplasty.  PROCEDURE:  Right total knee arthroplasty revision.  SURGEON:  Gaynelle Arabian, MD  ASSISTANT:  Theresa Duty, PA-C  ANESTHESIA:  Adductor canal block and spinal.  ESTIMATED BLOOD LOSS:  100 mL.  DRAINS:  Hemovac x1.  TOURNIQUET TIME:  Up 34 minutes at 300 mmHg, down 8 minutes, then up additional 22 minutes at 300 mmHg.  COMPLICATIONS:  None.  CONDITION:  Stable to recovery.  BRIEF CLINICAL NOTE:  The patient is a 62 year old female who has a loose unstable right total knee arthroplasty.  She has had progressively worsening pain and instability.  She has failed nonoperative management and presents for total knee arthroplasty  revision.  PROCEDURE IN DETAIL:  After successful administration of adductor canal block and spinal anesthetic, a tourniquet was placed on her right thigh and right lower extremity, prepped and draped in the usual sterile fashion.  Extremity was wrapped in Esmarch,  tourniquet inflated to 300 mmHg.  A midline incision was made with a 10 blade through subcutaneous tissue to the level of the extensor mechanism.  A fresh blade was used to make a medial parapatellar arthrotomy.  Soft tissue on the proximal medial tibia  was subperiosteally elevated to the joint line with a knife and into the semimembranosus bursa with a Cobb elevator.  Soft tissue laterally was elevated with attention being paid to avoid the patellar tendon on the tibial tubercle.  Patella was everted,  knee flexed 90 degrees.  The polyethylene was then removed from the tibial tray.  The tibia subluxed forward and circumferential retraction was placed.   Oscillating saw was used to disrupt the interface between the tibial component and bone, and the  tibial component was easily removed.  Cement was removed from the tibial canal.  I reamed up to 13 mm with placement of a 13 mm stem.  Extramedullary tibial alignment guide was placed referencing proximally at the medial aspect of the tibial tubercle and  distally along the second metatarsal axis and tibial crest.  The block was pinned to remove about 2 mm off the cut bone surface.  Resection was made with an oscillating saw.  Size 2.5 was the most appropriate tibial component.  The proximal tibia was  prepared with a modular drill, then modular drill plus stem extension for the size 2.5.  I then broached for a 29 mm sleeve for rotational control.  Tibial preparation was then completed.  On the femoral side, I used an osteotome to disrupt the interface between the femoral component and bone, and the component was removed with minimal to no bone loss.  The femoral canal was then accessed and thoroughly irrigated with saline.  I reamed up  to 16 mm, which had an excellent press fit, and a 16 mm reamer was left in the canal to serve as our intramedullary cutting guide.  Distal femoral cutting block was placed.  Then I went to the +4 position to get any bone resection.  We thus used 4 mm  augments, medial and lateral, on the distal surface.  Size 2.5 was also most the appropriate femoral component.  A size 2.5 cutting block was placed with 4 mm medial and lateral augments, and rotation was marked off the  epicondylar axis and was also  confirmed by creating a rectangular flexion gap at 90 degrees with the spacer blocks.  The block was pinned in this position.  We did not get any bone anterior or on the chamfers.  I had to go in a +4 position to get any bone posterolateral.   Posteromedial was fine.  We thus needed a posterolateral 4 mm augment.  We then placed the intercondylar block and made an intercondylar cut  for the TC3.  Trials were placed.  I first placed a cement restrictor in the tibial canal.  A size 4 was the most appropriate and placed at the appropriate depth in the canal.  Tibial trial was placed, which was a size 2.5 MBT revision tray with a 13 x 30 stem  extension, 29 sleeve, and 5 mm augments medial and lateral.  On the femoral side, the trial is a size 2.5 TC3 femur with a 16 x 75 stem in a +2 position in 5 degrees of valgus.  Augments of 4 mm are distal, medial, and lateral, and there is a 4 mm  posterior augment laterally.  The trial was placed with excellent fit.  A 12.5 spacer was most appropriate.  Full extension was achieved with excellent varus/valgus and anterior/posterior balance throughout full range of motion.  Patella was again  everted.  I did a patelloplasty, removing all the soft tissue from the patella.  There was evidence of a well-fixed patellar button, which does not show any wear; thus, it was left intact as it tracks normally.  I then released the tourniquet after a  tourniquet time of 34 minutes.  We kept it down for 8 minutes while the components were assembled on the back table.  Minimal bleeding was encountered, and the bleeding that was encountered was cauterized.  After the components were assembled on the back  table, then the leg was rewrapped in Esmarch and tourniquet reinflated to 300.  The trials were removed and the cut bone surfaces were prepared with pulsatile lavage.  Three batches of gentamicin-impregnated cement were mixed, and once ready for  implantation, the tibial component was cemented in the canal and at the cut bone surface.  On the femoral side, we cemented distally with a press-fit stem.  The sizes were the same as the trials.  A 12.5 mm trial insert was placed.  Knee held in full  extension.  All extruded cement removed.  When the cement was fully hardened, the knee permanent 12.5 mm TC3 rotating platform insert was placed in the tibial tray.  The  wound was copiously irrigated with saline solution.  Twenty mL of Exparel mixed with  60 mL of saline were then injected in the posterior capsule, medial and lateral gutters, extensor mechanism, and subcu tissues.  The extensor mechanism was then closed over a Hemovac drain with a running 0 Stratafix suture.  Subcu was closed over a  second limb of a Hemovac drain with interrupted 2-0 Vicryl.  The tourniquet had been let down for a second tourniquet time of 22 minutes prior to closing the subcu.  Subcuticular was then closed with a running 4-0 Monocryl.  Incisions were cleaned and  dried, and Steri-Strips and a bulky sterile dressing were applied.  Note that a surgical assistant was a medical necessity for this procedure to do it in a safe and expeditious manner.  Surgical assistance was necessary for retraction of vital ligaments and neurovascular structures and for proper positioning of the  limb  for safe removal of the old implant and safe and accurate placement of the new implant.  LN/NUANCE  D:03/01/2018 T:03/01/2018 JOB:[id number]

## 2018-03-01 NOTE — Anesthesia Postprocedure Evaluation (Signed)
Anesthesia Post Note  Patient: Paula Sutton  Procedure(s) Performed: RIGHT TOTAL KNEE REVISION (Right Knee)     Patient location during evaluation: PACU Anesthesia Type: Spinal Level of consciousness: oriented, awake and alert and awake Pain management: pain level controlled Vital Signs Assessment: post-procedure vital signs reviewed and stable Respiratory status: spontaneous breathing, respiratory function stable and nonlabored ventilation Cardiovascular status: blood pressure returned to baseline and stable Postop Assessment: no headache, no backache, no apparent nausea or vomiting, spinal receding and patient able to bend at knees Anesthetic complications: no    Last Vitals:  Vitals:   03/01/18 1642 03/01/18 1749  BP: 126/81 (!) 132/94  Pulse: (!) 49 61  Resp: 14 16  Temp: (!) 36.3 C (!) 36.3 C  SpO2: 97% 98%    Last Pain:  Vitals:   03/01/18 1749  TempSrc: Oral  PainSc:                  Catalina Gravel

## 2018-03-01 NOTE — Transfer of Care (Signed)
Immediate Anesthesia Transfer of Care Note  Patient: Paula Sutton  Procedure(s) Performed: RIGHT TOTAL KNEE REVISION (Right Knee)  Patient Location: PACU  Anesthesia Type:Spinal  Level of Consciousness: awake, alert , oriented and patient cooperative  Airway & Oxygen Therapy: Patient Spontanous Breathing and Patient connected to face mask oxygen  Post-op Assessment: Report given to RN and Post -op Vital signs reviewed and stable  Post vital signs: stable  Last Vitals:  Vitals Value Taken Time  BP 125/88 03/01/2018  2:44 PM  Temp    Pulse 70 03/01/2018  2:46 PM  Resp 15 03/01/2018  2:46 PM  SpO2 100 % 03/01/2018  2:46 PM  Vitals shown include unvalidated device data.  Last Pain:  Vitals:   03/01/18 1102  TempSrc: Oral      Patients Stated Pain Goal: 4 (32/25/67 2091)  Complications: No apparent anesthesia complications

## 2018-03-01 NOTE — Anesthesia Preprocedure Evaluation (Addendum)
Anesthesia Evaluation  Patient identified by MRN, date of birth, ID band Patient awake    Reviewed: Allergy & Precautions, NPO status , Patient's Chart, lab work & pertinent test results  Airway Mallampati: II  TM Distance: >3 FB Neck ROM: Full    Dental  (+) Teeth Intact, Dental Advisory Given   Pulmonary former smoker,    Pulmonary exam normal breath sounds clear to auscultation       Cardiovascular Normal cardiovascular exam+ dysrhythmias (LAFB)  Rhythm:Regular Rate:Normal     Neuro/Psych negative neurological ROS  negative psych ROS   GI/Hepatic GERD  Medicated,(+)     substance abuse  marijuana use,   Endo/Other  Obesity   Renal/GU negative Renal ROS     Musculoskeletal  (+) Arthritis , Osteoarthritis,    Abdominal   Peds  Hematology negative hematology ROS (+) Plt 240k   Anesthesia Other Findings Day of surgery medications reviewed with the patient.  Reproductive/Obstetrics                            Anesthesia Physical Anesthesia Plan  ASA: II  Anesthesia Plan: Spinal   Post-op Pain Management:  Regional for Post-op pain   Induction: Intravenous  PONV Risk Score and Plan: 2 and Propofol infusion, Ondansetron and Midazolam  Airway Management Planned: Natural Airway and Nasal Cannula  Additional Equipment:   Intra-op Plan:   Post-operative Plan:   Informed Consent: I have reviewed the patients History and Physical, chart, labs and discussed the procedure including the risks, benefits and alternatives for the proposed anesthesia with the patient or authorized representative who has indicated his/her understanding and acceptance.   Dental advisory given  Plan Discussed with: CRNA, Anesthesiologist and Surgeon  Anesthesia Plan Comments:         Anesthesia Quick Evaluation

## 2018-03-02 ENCOUNTER — Encounter (HOSPITAL_COMMUNITY): Payer: Self-pay | Admitting: Orthopedic Surgery

## 2018-03-02 DIAGNOSIS — T84032A Mechanical loosening of internal right knee prosthetic joint, initial encounter: Secondary | ICD-10-CM | POA: Diagnosis not present

## 2018-03-02 LAB — CBC
HCT: 38 % (ref 36.0–46.0)
Hemoglobin: 12.1 g/dL (ref 12.0–15.0)
MCH: 30 pg (ref 26.0–34.0)
MCHC: 31.8 g/dL (ref 30.0–36.0)
MCV: 94.1 fL (ref 80.0–100.0)
NRBC: 0 % (ref 0.0–0.2)
Platelets: 233 10*3/uL (ref 150–400)
RBC: 4.04 MIL/uL (ref 3.87–5.11)
RDW: 13.5 % (ref 11.5–15.5)
WBC: 15.3 10*3/uL — AB (ref 4.0–10.5)

## 2018-03-02 LAB — BASIC METABOLIC PANEL
Anion gap: 12 (ref 5–15)
BUN: 9 mg/dL (ref 8–23)
CO2: 22 mmol/L (ref 22–32)
Calcium: 8.5 mg/dL — ABNORMAL LOW (ref 8.9–10.3)
Chloride: 105 mmol/L (ref 98–111)
Creatinine, Ser: 0.79 mg/dL (ref 0.44–1.00)
GFR calc non Af Amer: >60 mL/min (ref >60)
Glucose, Bld: 128 mg/dL — ABNORMAL HIGH (ref 70–99)
Potassium: 4.1 mmol/L (ref 3.5–5.1)
Sodium: 139 mmol/L (ref 135–145)

## 2018-03-02 MED ORDER — METHOCARBAMOL 500 MG PO TABS: 500.0000 mg | ORAL_TABLET | Freq: Four times a day (QID) | ORAL | 0 refills | Status: DC | PRN

## 2018-03-02 MED ORDER — ASPIRIN 325 MG PO TBEC: 325.0000 mg | DELAYED_RELEASE_TABLET | Freq: Two times a day (BID) | ORAL | 0 refills | Status: AC

## 2018-03-02 MED ORDER — TRAMADOL HCL 50 MG PO TABS: 50.0000 mg | ORAL_TABLET | Freq: Four times a day (QID) | ORAL | 0 refills | Status: DC | PRN

## 2018-03-02 MED ORDER — OXYCODONE HCL 5 MG PO TABS: 5.0000 mg | ORAL_TABLET | Freq: Four times a day (QID) | ORAL | 0 refills | Status: DC | PRN

## 2018-03-02 NOTE — Care Management Note (Signed)
Case Management Note  Patient Details  Name: Paula Sutton MRN: [id number] Date of Birth: 03-Dec-1955  Subjective/Objective:     Spoke with patient at bedside. Confirmed plan for HEP. Has RW and 3n1. [phone]               Action/Plan:   Expected Discharge Date:  03/02/18               Expected Discharge Plan:  Home/Self Care  In-House Referral:  NA  Discharge planning Services  CM Consult  Post Acute Care Choice:  NA Choice offered to:  Patient  DME Arranged:  N/A DME Agency:  NA  HH Arranged:  NA HH Agency:  NA  Status of Service:  Completed, signed off  If discussed at [facility] of Stay Meetings, dates discussed:    Additional Comments:  Guadalupe Maple, RN 03/02/2018, 9:32 AM

## 2018-03-02 NOTE — Progress Notes (Signed)
Reviewed discharge instructions, education and dressing change. Patient verbalizes understanding and has no further questions. Patient has equipment at home. Husband to care for patient at home and OPPT.

## 2018-03-02 NOTE — Progress Notes (Signed)
   Subjective: 1 Day Post-Op Procedure(s) (LRB): RIGHT TOTAL KNEE REVISION (Right) Patient reports pain as mild.   Patient seen in rounds by Dr. Wynelle Link. Patient is well, and has had no acute complaints or problems other than pain in the right knee. No issues overnight. Denies chest pain, SOB or calf pain. Foley catheter removed this AM.  We will start therapy today.   Objective: Vital signs in last 24 hours: Temp:  [97.1 F (36.2 C)-97.8 F (36.6 C)] 97.4 F (36.3 C) (12/19 0514) Pulse Rate:  [43-81] 63 (12/19 0514) Resp:  [8-22] 18 (12/19 0514) BP: (106-149)/(68-94) 128/80 (12/19 0514) SpO2:  [93 %-100 %] 98 % (12/19 0514) Weight:  [78.5 kg] 78.5 kg (12/18 1112)  Intake/Output from previous day:  Intake/Output Summary (Last 24 hours) at 03/02/2018 0737 Last data filed at 03/02/2018 0600 Gross per 24 hour  Intake 4431.25 ml  Output 2955 ml  Net 1476.25 ml    Labs: Recent Labs    02/27/18 1116 03/02/18 0523  HGB 14.0 12.1   Recent Labs    02/27/18 1116 03/02/18 0523  WBC 5.1 15.3*  RBC 4.75 4.04  HCT 44.6 38.0  PLT 240 233   Recent Labs    02/27/18 1116 03/02/18 0523  NA 139 139  K 4.3 4.1  CL 107 105  CO2 24 22  BUN 16 9  CREATININE 0.81 0.79  GLUCOSE 93 128*  CALCIUM 8.9 8.5*   Recent Labs    02/27/18 1116  INR 0.92    Exam: General - Patient is Alert and Oriented Extremity - Neurologically intact Neurovascular intact Sensation intact distally Dorsiflexion/Plantar flexion intact Dressing - dressing C/D/I Motor Function - intact, moving foot and toes well on exam.   Past Medical History:  Diagnosis Date  . Arthritis    knee  . Dyslipidemia   . GERD (gastroesophageal reflux disease)   . Left anterior fascicular block    PATIENT SAYS THIS SHOWED ON EKG DURING COLONOSCOPY , WAS REFERRED TO CARDIOLOGY DR. Minus Breeding  (SEE EPIC ENCOUNTER 09-2017)       Assessment/Plan: 1 Day Post-Op Procedure(s) (LRB): RIGHT TOTAL KNEE REVISION  (Right) Principal Problem:   Failed total knee arthroplasty (HCC)  Estimated body mass index is 31.14 kg/m as calculated from the following:   Height as of this encounter: 5' 2.5" (1.588 m).   Weight as of this encounter: 78.5 kg. Advance diet Up with therapy D/C IV fluids  DVT Prophylaxis - Aspirin Weight bearing as tolerated. D/C O2 and pulse ox and try on room air. Hemovac pulled without difficulty, will begin therapy.  Plan is to go Home after hospital stay. Possible discharge this afternoon if progresses with therapy and meeting her goals. Scheduled for outpatient physical therapy at [facility] in [place]. Follow-up in the office in 2 weeks with Dr. Wynelle Link.  Paula Duty, PA-C Orthopedic Surgery 03/02/2018, 7:37 AM

## 2018-03-02 NOTE — Evaluation (Signed)
Physical Therapy Evaluation Patient Details Name: Paula Sutton MRN: [id number] DOB: Jun 29, 1955 Today's Date: 03/02/2018   History of Present Illness  revision right TKA  Clinical Impression  The patient is  Progressing well. Plans DC today. HEP provided    Follow Up Recommendations Outpatient PT;Follow surgeon's recommendation for DC plan and follow-up therapies    Equipment Recommendations  None recommended by PT    Recommendations for Other Services       Precautions / Restrictions Precautions Precautions: Knee;Fall Precaution Comments: did not need KI      Mobility  Bed Mobility Overal bed mobility: Independent                Transfers Overall transfer level: Needs assistance Equipment used: Rolling walker (2 wheeled) Transfers: Sit to/from Stand Sit to Stand: Supervision            Ambulation/Gait Ambulation/Gait assistance: Supervision Gait Distance (Feet): 300 Feet Assistive device: Rolling walker (2 wheeled) Gait Pattern/deviations: Step-through pattern;Step-to pattern     General Gait Details: cues for sequence  Stairs            Wheelchair Mobility    Modified Rankin (Stroke Patients Only)       Balance                                             Pertinent Vitals/Pain Pain Assessment: 0-10 Pain Score: 4  Pain Location: right knee Pain Descriptors / Indicators: Discomfort;Sore Pain Intervention(s): Monitored during session;Premedicated before session    [place] expects to be discharged to:: Private residence Living Arrangements: Spouse/significant other Available Help at Discharge: Family Type of Home: House Home Access: Stairs to enter   Technical brewer of Steps: 1 Home Layout: One level Home Equipment: Environmental consultant - 2 wheels      Prior Function Level of Independence: Independent               Hand Dominance        Extremity/Trunk Assessment   Upper Extremity  Assessment Upper Extremity Assessment: Overall WFL for tasks assessed    Lower Extremity Assessment Lower Extremity Assessment: RLE deficits/detail RLE Deficits / Details: 5060 degrees    Cervical / Trunk Assessment Cervical / Trunk Assessment: Normal  Communication   Communication: No difficulties  Cognition Arousal/Alertness: Awake/alert Behavior During Therapy: WFL for tasks assessed/performed Overall Cognitive Status: Within Functional Limits for tasks assessed                                        General Comments      Exercises Total Joint Exercises Ankle Circles/Pumps: AROM;Right;10 reps;Supine Quad Sets: AROM;Right;10 reps;Supine Short Arc Quad: AROM;Right;10 reps;Supine Heel Slides: AAROM;Right;10 reps Hip ABduction/ADduction: AROM;Right;10 reps Straight Leg Raises: AROM;Right   Assessment/Plan    PT Assessment All further PT needs can be met in the next venue of care  PT Problem List Decreased strength;Decreased range of motion;Decreased activity tolerance;Decreased mobility;Pain       PT Treatment Interventions DME instruction    PT Goals (Current goals can be found in the Care Plan section)  Acute Rehab PT Goals Patient Stated Goal: home PT Goal Formulation: All assessment and education complete, DC therapy    Frequency     Barriers to discharge  Co-evaluation               AM-PAC PT "6 Clicks" Mobility  Outcome Measure Help needed turning from your back to your side while in a flat bed without using bedrails?: None Help needed moving from lying on your back to sitting on the side of a flat bed without using bedrails?: None Help needed moving to and from a bed to a chair (including a wheelchair)?: None Help needed standing up from a chair using your arms (e.g., wheelchair or bedside chair)?: A Little Help needed to walk in hospital room?: A Little Help needed climbing 3-5 steps with a railing? : A Little 6 Click  Score: 21    End of Session   Activity Tolerance: Patient tolerated treatment well Patient left: in chair Nurse Communication: Mobility status PT Visit Diagnosis: Pain Pain - Right/Left: Right Pain - part of body: Knee    Time: 7014-1030 PT Time Calculation (min) (ACUTE ONLY): 22 min   Charges:   PT Evaluation $PT Eval Low Complexity: [facility] Acute Rehabilitation Services Pager [phone] Office [phone]   Claretha Cooper 03/02/2018, 1:15 PM

## 2018-03-06 NOTE — Discharge Summary (Signed)
Physician Discharge Summary   Patient ID: Paula Sutton MRN: [id number] DOB/AGE: [birth year] 62 y.o.  Admit date: 03/01/2018 Discharge date: 03/02/2018  Primary Diagnosis: Failed right total knee arthroplasty   Admission Diagnoses:  Past Medical History:  Diagnosis Date  . Arthritis    knee  . Dyslipidemia   . GERD (gastroesophageal reflux disease)   . Left anterior fascicular block    PATIENT SAYS THIS SHOWED ON EKG DURING COLONOSCOPY , WAS REFERRED TO CARDIOLOGY DR. Minus Breeding  (SEE EPIC ENCOUNTER 09-2017)      Discharge Diagnoses:   Principal Problem:   Failed total knee arthroplasty ([place])  Estimated body mass index is 31.14 kg/m as calculated from the following:   Height as of this encounter: 5' 2.5" (1.588 m).   Weight as of this encounter: 78.5 kg.  Procedure:  Procedure(s) (LRB): RIGHT TOTAL KNEE REVISION (Right)   Consults: None  HPI: The patient is a 62 year old female who has a loose unstable right total knee arthroplasty.  She has had progressively worsening pain and instability.  She has failed nonoperative management and presents for total knee arthroplasty revision.  Laboratory Data: Admission on 03/01/2018, Discharged on 03/02/2018  Component Date Value Ref Range Status  . WBC 03/02/2018 15.3* 4.0 - 10.5 K/uL Final  . RBC 03/02/2018 4.04  3.87 - 5.11 MIL/uL Final  . Hemoglobin 03/02/2018 12.1  12.0 - 15.0 g/dL Final  . HCT 03/02/2018 38.0  36.0 - 46.0 % Final  . MCV 03/02/2018 94.1  80.0 - 100.0 fL Final  . MCH 03/02/2018 30.0  26.0 - 34.0 pg Final  . MCHC 03/02/2018 31.8  30.0 - 36.0 g/dL Final  . RDW 03/02/2018 13.5  11.5 - 15.5 % Final  . Platelets 03/02/2018 233  150 - 400 K/uL Final  . nRBC 03/02/2018 0.0  0.0 - 0.2 % Final   Performed at [facility], [place] [address], [place], [place] [address]  . Sodium 03/02/2018 139  135 - 145 mmol/L Final  . Potassium 03/02/2018 4.1  3.5 - 5.1 mmol/L Final  . Chloride 03/02/2018 105  98  - 111 mmol/L Final  . CO2 03/02/2018 22  22 - 32 mmol/L Final  . Glucose, Bld 03/02/2018 128* 70 - 99 mg/dL Final  . BUN 03/02/2018 9  8 - 23 mg/dL Final  . Creatinine, Ser 03/02/2018 0.79  0.44 - 1.00 mg/dL Final  . Calcium 03/02/2018 8.5* 8.9 - 10.3 mg/dL Final  . GFR calc non Af Amer 03/02/2018 >60  >60 mL/min Final  . GFR calc Af Amer 03/02/2018 >60  >60 mL/min Final  . Anion gap 03/02/2018 12  5 - 15 Final   Performed at [facility], [place] [address], [place], [place] [address]  Hospital Outpatient Visit on 02/27/2018  Component Date Value Ref Range Status  . aPTT 02/27/2018 40* 24 - 36 seconds Final   Comment:        IF BASELINE aPTT IS ELEVATED, SUGGEST PATIENT RISK ASSESSMENT BE USED TO DETERMINE APPROPRIATE ANTICOAGULANT THERAPY. Performed at [facility], [place] [address], [place],  [address]   . WBC 02/27/2018 5.1  4.0 - 10.5 K/uL Final  . RBC 02/27/2018 4.75  3.87 - 5.11 MIL/uL Final  . Hemoglobin 02/27/2018 14.0  12.0 - 15.0 g/dL Final  . HCT 02/27/2018 44.6  36.0 - 46.0 % Final  . MCV 02/27/2018 93.9  80.0 - 100.0 fL Final  . MCH 02/27/2018 29.5  26.0 -  34.0 pg Final  . MCHC 02/27/2018 31.4  30.0 - 36.0 g/dL Final  . RDW 02/27/2018 13.7  11.5 - 15.5 % Final  . Platelets 02/27/2018 240  150 - 400 K/uL Final  . nRBC 02/27/2018 0.0  0.0 - 0.2 % Final   Performed at [facility], [place] [address], [place], [place] [address]  . Sodium 02/27/2018 139  135 - 145 mmol/L Final  . Potassium 02/27/2018 4.3  3.5 - 5.1 mmol/L Final  . Chloride 02/27/2018 107  98 - 111 mmol/L Final  . CO2 02/27/2018 24  22 - 32 mmol/L Final  . Glucose, Bld 02/27/2018 93  70 - 99 mg/dL Final  . BUN 02/27/2018 16  8 - 23 mg/dL Final  . Creatinine, Ser 02/27/2018 0.81  0.44 - 1.00 mg/dL Final  . Calcium 02/27/2018 8.9  8.9 - 10.3 mg/dL Final  . Total Protein 02/27/2018 6.7  6.5 - 8.1 g/dL Final  . Albumin 02/27/2018 3.9  3.5 - 5.0 g/dL  Final  . AST 02/27/2018 23  15 - 41 U/L Final  . ALT 02/27/2018 19  0 - 44 U/L Final  . Alkaline Phosphatase 02/27/2018 44  38 - 126 U/L Final  . Total Bilirubin 02/27/2018 0.6  0.3 - 1.2 mg/dL Final  . GFR calc non Af Amer 02/27/2018 >60  >60 mL/min Final  . GFR calc Af Amer 02/27/2018 >60  >60 mL/min Final  . Anion gap 02/27/2018 8  5 - 15 Final   Performed at [facility], [place] [address] [place], [place] [address]  . Prothrombin Time 02/27/2018 12.3  11.4 - 15.2 seconds Final  . INR 02/27/2018 0.92   Final   Performed at [place] [address], [place], [place] [address]  . ABO/RH(D) 02/27/2018 A NEG   Final  . Antibody Screen 02/27/2018 NEG   Final  . Sample Expiration 02/27/2018 03/04/2018   Final  . Extend sample reason 02/27/2018    Final                   Value:NO TRANSFUSIONS OR PREGNANCY IN THE PAST 3 MONTHS Performed at [facility], [place] [address] [place], [place] [address]   . MRSA, PCR 02/27/2018 NEGATIVE  NEGATIVE Final  . Staphylococcus aureus 02/27/2018 NEGATIVE  NEGATIVE Final   Comment: (NOTE) The Xpert SA Assay (FDA approved for NASAL specimens in patients 78 years of age and older), is one component of a comprehensive surveillance program. It is not intended to diagnose infection nor to guide or monitor treatment. Performed at [facility], [place] [address], [place], [place] [address]   . ABO/RH(D) 02/27/2018    Final                   Value:A NEG Performed at [facility], [place] [address], [place],  [address]      X-Rays:No results found.  EKG: Orders placed or performed in visit on 09/27/17  . EKG 12-Lead     Hospital Course: ALEXANDER MCAULEY is a 62 y.o. who was admitted to [facility]. They were brought to the operating room on 03/01/2018 and underwent Procedure(s): RIGHT TOTAL KNEE REVISION.  Patient tolerated the procedure well and was  later transferred to the recovery room and then to the orthopaedic floor for postoperative care. They were given PO and IV analgesics for pain control following their surgery. They were given 24  hours of postoperative antibiotics of  Anti-infectives (From admission, onward)   Start     Dose/Rate Route Frequency Ordered Stop   03/01/18 1900  ceFAZolin (ANCEF) IVPB 2g/100 mL premix     2 g 200 mL/hr over 30 Minutes Intravenous Every 6 hours 03/01/18 1651 03/02/18 0200   03/01/18 1100  ceFAZolin (ANCEF) IVPB 2g/100 mL premix     2 g 200 mL/hr over 30 Minutes Intravenous On call to O.R. 03/01/18 1057 03/01/18 1227     and started on DVT prophylaxis in the form of Aspirin.   PT and OT were ordered for total joint protocol. Discharge planning consulted to help with postop disposition and equipment needs.  Patient had a good night on the evening of surgery. They started to get up OOB with therapy on POD #1. Pt was seen during rounds and was ready to go home pending progress with therapy. Hemovac drain was pulled without difficulty. She worked with therapy on POD #1 and was meeting her goals. Pt was discharged to home later that day in stable condition.  Diet: Regular diet Activity: WBAT Follow-up: in 2 weeks with Dr. Wynelle Link Disposition: Home with outpatient physical therapy at [facility] in [place] Discharged Condition: stable   Discharge Instructions    Call MD / Call 911   Complete by:  As directed    If you experience chest pain or shortness of breath, CALL 911 and be transported to the hospital emergency room.  If you develope a fever above 101 F, pus (white drainage) or increased drainage or redness at the wound, or calf pain, call your surgeon's office.   Change dressing   Complete by:  As directed    Change dressing on Friday, then change the dressing daily with sterile 4 x 4 inch gauze dressing and apply TED hose.   Constipation Prevention   Complete by:  As directed    Drink plenty of fluids.   Prune juice may be helpful.  You may use a stool softener, such as Colace (over the counter) 100 mg twice a day.  Use MiraLax (over the counter) for constipation as needed.   Diet - low sodium heart healthy   Complete by:  As directed    Discharge instructions   Complete by:  As directed    Dr. Gaynelle Arabian Total Joint Specialist [facility] [address] [place], [place] [address] [phone]  TOTAL KNEE REPLACEMENT POSTOPERATIVE DIRECTIONS  Knee Rehabilitation, Guidelines Following Surgery  Results after knee surgery are often greatly improved when you follow the exercise, range of motion and muscle strengthening exercises prescribed by your doctor. Safety measures are also important to protect the knee from further injury. Any time any of these exercises cause you to have increased pain or swelling in your knee joint, decrease the amount until you are comfortable again and slowly increase them. If you have problems or questions, call your caregiver or physical therapist for advice.   HOME CARE INSTRUCTIONS  Remove items at home which could result in a fall. This includes throw rugs or furniture in walking pathways.  ICE to the affected knee every three hours for 30 minutes at a time and then as needed for pain and swelling.  Continue to use ice on the knee for pain and swelling from surgery. You may notice swelling that will progress down to the foot and ankle.  This is normal after surgery.  Elevate the leg when you are not up walking on  it.   Continue to use the breathing machine which will help keep your temperature down.  It is common for your temperature to cycle up and down following surgery, especially at night when you are not up moving around and exerting yourself.  The breathing machine keeps your lungs expanded and your temperature down. Do not place pillow under knee, focus on keeping the knee straight while resting   DIET You may resume your previous home diet  once your are discharged from the hospital.  DRESSING / WOUND CARE / SHOWERING You may shower 3 days after surgery, but keep the wounds dry during showering.  You may use an occlusive plastic wrap (Press'n Seal for example), NO SOAKING/SUBMERGING IN THE BATHTUB.  If the bandage gets wet, change with a clean dry gauze.  If the incision gets wet, pat the wound dry with a clean towel. You may start showering once you are discharged home but do not submerge the incision under water. Just pat the incision dry and apply a dry gauze dressing on daily. Change the surgical dressing daily and reapply a dry dressing each time.  ACTIVITY Walk with your walker as instructed. Use walker as long as suggested by your caregivers. Avoid periods of inactivity such as sitting longer than an hour when not asleep. This helps prevent blood clots.  You may resume a sexual relationship in one month or when given the OK by your doctor.  You may return to work once you are cleared by your doctor.  Do not drive a car for 6 weeks or until released by you surgeon.  Do not drive while taking narcotics.  WEIGHT BEARING Weight bearing as tolerated with assist device (walker, cane, etc) as directed, use it as long as suggested by your surgeon or therapist, typically at least 4-6 weeks.  POSTOPERATIVE CONSTIPATION PROTOCOL Constipation - defined medically as fewer than three stools per week and severe constipation as less than one stool per week.  One of the most common issues patients have following surgery is constipation.  Even if you have a regular bowel pattern at home, your normal regimen is likely to be disrupted due to multiple reasons following surgery.  Combination of anesthesia, postoperative narcotics, change in appetite and fluid intake all can affect your bowels.  In order to avoid complications following surgery, here are some recommendations in order to help you during your recovery period.  Colace (docusate) -  Pick up an over-the-counter form of Colace or another stool softener and take twice a day as long as you are requiring postoperative pain medications.  Take with a full glass of water daily.  If you experience loose stools or diarrhea, hold the colace until you stool forms back up.  If your symptoms do not get better within 1 week or if they get worse, check with your doctor.  Dulcolax (bisacodyl) - Pick up over-the-counter and take as directed by the product packaging as needed to assist with the movement of your bowels.  Take with a full glass of water.  Use this product as needed if not relieved by Colace only.   MiraLax (polyethylene glycol) - Pick up over-the-counter to have on hand.  MiraLax is a solution that will increase the amount of water in your bowels to assist with bowel movements.  Take as directed and can mix with a glass of water, juice, soda, coffee, or tea.  Take if you go more than two days without a movement. Do not use  MiraLax more than once per day. Call your doctor if you are still constipated or irregular after using this medication for 7 days in a row.  If you continue to have problems with postoperative constipation, please contact the office for further assistance and recommendations.  If you experience "the worst abdominal pain ever" or develop nausea or vomiting, please contact the office immediatly for further recommendations for treatment.  ITCHING  If you experience itching with your medications, try taking only a single pain pill, or even half a pain pill at a time.  You can also use Benadryl over the counter for itching or also to help with sleep.   TED HOSE STOCKINGS Wear the elastic stockings on both legs for three weeks following surgery during the day but you may remove then at night for sleeping.  MEDICATIONS See your medication summary on the "After Visit Summary" that the nursing staff will review with you prior to discharge.  You may have some home  medications which will be placed on hold until you complete the course of blood thinner medication.  It is important for you to complete the blood thinner medication as prescribed by your surgeon.  Continue your approved medications as instructed at time of discharge.  PRECAUTIONS If you experience chest pain or shortness of breath - call 911 immediately for transfer to the hospital emergency department.  If you develop a fever greater that 101 F, purulent drainage from wound, increased redness or drainage from wound, foul odor from the wound/dressing, or calf pain - CONTACT YOUR SURGEON.                                                   FOLLOW-UP APPOINTMENTS Make sure you keep all of your appointments after your operation with your surgeon and caregivers. You should call the office at the above phone number and make an appointment for approximately two weeks after the date of your surgery or on the date instructed by your surgeon outlined in the "After Visit Summary".   RANGE OF MOTION AND STRENGTHENING EXERCISES  Rehabilitation of the knee is important following a knee injury or an operation. After just a few days of immobilization, the muscles of the thigh which control the knee become weakened and shrink (atrophy). Knee exercises are designed to build up the tone and strength of the thigh muscles and to improve knee motion. Often times heat used for twenty to thirty minutes before working out will loosen up your tissues and help with improving the range of motion but do not use heat for the first two weeks following surgery. These exercises can be done on a training (exercise) mat, on the floor, on a table or on a bed. Use what ever works the best and is most comfortable for you Knee exercises include:  Leg Lifts - While your knee is still immobilized in a splint or cast, you can do straight leg raises. Lift the leg to 60 degrees, hold for 3 sec, and slowly lower the leg. Repeat 10-20 times 2-3  times daily. Perform this exercise against resistance later as your knee gets better.  Quad and Hamstring Sets - Tighten up the muscle on the front of the thigh (Quad) and hold for 5-10 sec. Repeat this 10-20 times hourly. Hamstring sets are done by pushing the foot backward against  an object and holding for 5-10 sec. Repeat as with quad sets.  Leg Slides: Lying on your back, slowly slide your foot toward your buttocks, bending your knee up off the floor (only go as far as is comfortable). Then slowly slide your foot back down until your leg is flat on the floor again. Angel Wings: Lying on your back spread your legs to the side as far apart as you can without causing discomfort.  A rehabilitation program following serious knee injuries can speed recovery and prevent re-injury in the future due to weakened muscles. Contact your doctor or a physical therapist for more information on knee rehabilitation.   IF YOU ARE TRANSFERRED TO A SKILLED REHAB FACILITY If the patient is transferred to a skilled rehab facility following release from the hospital, a list of the current medications will be sent to the facility for the patient to continue.  When discharged from the skilled rehab facility, please have the facility set up the patient's [place] prior to being released. Also, the skilled facility will be responsible for providing the patient with their medications at time of release from the facility to include their pain medication, the muscle relaxants, and their blood thinner medication. If the patient is still at the rehab facility at time of the two week follow up appointment, the skilled rehab facility will also need to assist the patient in arranging follow up appointment in our office and any transportation needs.  MAKE SURE YOU:  Understand these instructions.  Get help right away if you are not doing well or get worse.    Pick up stool softner and laxative for home use  following surgery while on pain medications. Do not submerge incision under water. Please use good hand washing techniques while changing dressing each day. May shower starting three days after surgery. Please use a clean towel to pat the incision dry following showers. Continue to use ice for pain and swelling after surgery. Do not use any lotions or creams on the incision until instructed by your surgeon.   Do not put a pillow under the knee. Place it under the heel.   Complete by:  As directed    Driving restrictions   Complete by:  As directed    No driving for two weeks   TED hose   Complete by:  As directed    Use stockings (TED hose) for three weeks on both leg(s).  You may remove them at night for sleeping.   Weight bearing as tolerated   Complete by:  As directed      Allergies as of 03/02/2018      Reactions   Sulfa Antibiotics Anaphylaxis, Other (See Comments)   Tongue swelling       Medication List    TAKE these medications   aspirin 325 MG EC tablet Take 1 tablet (325 mg total) by mouth 2 (two) times daily for 20 days. Take one tablet (325 mg) Aspirin two times a day for three weeks following surgery. Then take one baby Aspirin (81 mg) once a day for three weeks. Then discontinue aspirin.   CALCIUM PO Take 1 tablet by mouth 2 (two) times daily.   estradiol 0.1 mg/24hr patch Commonly known as:  CLIMARA - Dosed in mg/24 hr Place 0.1 mg onto the skin once a week.   lansoprazole 30 MG capsule Commonly known as:  PREVACID 1 po 30 mins prior to first meal AND LAST MEAL What changed:  how much to take  how to take this  when to take this  additional instructions   linaclotide 145 MCG Caps capsule Commonly known as:  LINZESS Take 1 capsule (145 mcg total) by mouth daily before breakfast.   methocarbamol 500 MG tablet Commonly known as:  ROBAXIN Take 1 tablet (500 mg total) by mouth every 6 (six) hours as needed for muscle spasms.   OVER THE COUNTER  MEDICATION Take 1-2 capsules by mouth See admin instructions. Omega 3/Red Yeast Rice Take 1 capsule by mouth in the morning and take 2 capsules by mouth at bedtime   oxyCODONE 5 MG immediate release tablet Commonly known as:  Oxy IR/ROXICODONE Take 1-2 tablets (5-10 mg total) by mouth every 6 (six) hours as needed for severe pain.   traMADol 50 MG tablet Commonly known as:  ULTRAM Take 1-2 tablets (50-100 mg total) by mouth every 6 (six) hours as needed for moderate pain.   Vitamin D3 125 MCG (5000 UT) Caps Take 5,000 Units by mouth 2 (two) times daily.            Discharge Care Instructions  (From admission, onward)         Start     Ordered   03/02/18 0000  Weight bearing as tolerated     03/02/18 0740   03/02/18 0000  Change dressing    Comments:  Change dressing on Friday, then change the dressing daily with sterile 4 x 4 inch gauze dressing and apply TED hose.   03/02/18 0740         Follow-up Information    Gaynelle Arabian, MD. Schedule an appointment as soon as possible for a visit on 03/16/2018.   Specialty:  Orthopedic Surgery Contact information: [address] [place] [address] [phone]           Signed: Theresa Duty, PA-C Orthopedic Surgery 03/06/2018, 7:42 AM

## 2018-03-07 DIAGNOSIS — Z96651 Presence of right artificial knee joint: Secondary | ICD-10-CM | POA: Diagnosis not present

## 2018-03-07 DIAGNOSIS — Z471 Aftercare following joint replacement surgery: Secondary | ICD-10-CM | POA: Diagnosis not present

## 2018-03-07 DIAGNOSIS — M25561 Pain in right knee: Secondary | ICD-10-CM | POA: Diagnosis not present

## 2018-03-10 DIAGNOSIS — Z96651 Presence of right artificial knee joint: Secondary | ICD-10-CM | POA: Diagnosis not present

## 2018-03-10 DIAGNOSIS — M25561 Pain in right knee: Secondary | ICD-10-CM | POA: Diagnosis not present

## 2018-03-10 DIAGNOSIS — Z471 Aftercare following joint replacement surgery: Secondary | ICD-10-CM | POA: Diagnosis not present

## 2018-03-14 DIAGNOSIS — L03115 Cellulitis of right lower limb: Secondary | ICD-10-CM | POA: Diagnosis not present

## 2018-03-14 DIAGNOSIS — L509 Urticaria, unspecified: Secondary | ICD-10-CM | POA: Diagnosis not present

## 2018-03-16 DIAGNOSIS — Z471 Aftercare following joint replacement surgery: Secondary | ICD-10-CM | POA: Diagnosis not present

## 2018-03-16 DIAGNOSIS — Z96651 Presence of right artificial knee joint: Secondary | ICD-10-CM | POA: Diagnosis not present

## 2018-03-16 DIAGNOSIS — M25561 Pain in right knee: Secondary | ICD-10-CM | POA: Diagnosis not present

## 2018-03-20 DIAGNOSIS — Z471 Aftercare following joint replacement surgery: Secondary | ICD-10-CM | POA: Diagnosis not present

## 2018-03-20 DIAGNOSIS — Z96651 Presence of right artificial knee joint: Secondary | ICD-10-CM | POA: Diagnosis not present

## 2018-03-20 DIAGNOSIS — M25561 Pain in right knee: Secondary | ICD-10-CM | POA: Diagnosis not present

## 2018-03-23 DIAGNOSIS — Z471 Aftercare following joint replacement surgery: Secondary | ICD-10-CM | POA: Diagnosis not present

## 2018-03-23 DIAGNOSIS — Z96651 Presence of right artificial knee joint: Secondary | ICD-10-CM | POA: Diagnosis not present

## 2018-03-23 DIAGNOSIS — M25561 Pain in right knee: Secondary | ICD-10-CM | POA: Diagnosis not present

## 2018-03-27 DIAGNOSIS — M25561 Pain in right knee: Secondary | ICD-10-CM | POA: Diagnosis not present

## 2018-03-27 DIAGNOSIS — Z471 Aftercare following joint replacement surgery: Secondary | ICD-10-CM | POA: Diagnosis not present

## 2018-03-27 DIAGNOSIS — Z96651 Presence of right artificial knee joint: Secondary | ICD-10-CM | POA: Diagnosis not present

## 2018-03-30 DIAGNOSIS — M25561 Pain in right knee: Secondary | ICD-10-CM | POA: Diagnosis not present

## 2018-03-30 DIAGNOSIS — Z96651 Presence of right artificial knee joint: Secondary | ICD-10-CM | POA: Diagnosis not present

## 2018-03-30 DIAGNOSIS — Z471 Aftercare following joint replacement surgery: Secondary | ICD-10-CM | POA: Diagnosis not present

## 2018-04-03 DIAGNOSIS — Z471 Aftercare following joint replacement surgery: Secondary | ICD-10-CM | POA: Diagnosis not present

## 2018-04-03 DIAGNOSIS — Z96651 Presence of right artificial knee joint: Secondary | ICD-10-CM | POA: Diagnosis not present

## 2018-04-03 DIAGNOSIS — M25561 Pain in right knee: Secondary | ICD-10-CM | POA: Diagnosis not present

## 2018-04-04 DIAGNOSIS — Z96651 Presence of right artificial knee joint: Secondary | ICD-10-CM | POA: Diagnosis not present

## 2018-04-04 DIAGNOSIS — Z471 Aftercare following joint replacement surgery: Secondary | ICD-10-CM | POA: Diagnosis not present

## 2018-04-10 ENCOUNTER — Ambulatory Visit: Payer: 59

## 2018-04-13 ENCOUNTER — Ambulatory Visit: Payer: 59 | Admitting: Gastroenterology

## 2018-06-15 ENCOUNTER — Ambulatory Visit: Payer: 59 | Admitting: Gastroenterology

## 2018-09-01 ENCOUNTER — Ambulatory Visit: Payer: Self-pay | Admitting: Gastroenterology

## 2018-09-07 ENCOUNTER — Ambulatory Visit: Payer: Self-pay | Admitting: Gastroenterology

## 2018-09-13 NOTE — Progress Notes (Signed)
Referring Provider: Celene Squibb, MD Primary Care Physician:  Celene Squibb, MD Primary GI: Dr. Oneida Alar  Chief Complaint  Patient presents with  . Constipation    doing better    HPI:   Paula Sutton is a 63 y.o. female presenting today with a GI history of GERD and constipation.   Last seen in our office on 06/03/2017 for scheduling colonoscopy, constipation, and epigastric pain. Recommend continuing Prevacid BID.  Colonoscopy 08/09/17 with 3 simple adenomas, diverticulosis in recto-sigmoid, sigmoid, and descending colon, moderate external and internal hemorrhoids. Due for surveillance in 2022. EGD on 08/09/17 with low-grade narrowing Schatzki ring due to GERD; small hiatal hernia; mild gastritis and duodenitis due to NSAIDs.  Patient without complaints today.  Constipation: BM daily, rare straining, chocolate will cause constipation. Exercising, walking 30 minutes a day, eating healthier. Will have lower abdominal pain if she starts to get constipated. No abdominal pain otherwise. Takes Linzess or Miralax daily. Drinking plenty of water. No hematochezia, no melena.   GERD: No breakthrough symptoms typically, but if she eats something that is acidic she is likely to have heartburn. Taking Prevacid every morning, if she eats something she knows will cause heartburn, she will take the second Prevacid. Not even requiring the second Prevacid once a week. No N/V, epigastric pain, or dysphagia. Hiatal hernia isn't giving her a problem. Mentions digestive enzyme powders she takes occasionally that will help if she ever has trouble with hiatal hernia.    Past Medical History:  Diagnosis Date  . Arthritis    knee  . Dyslipidemia   . GERD (gastroesophageal reflux disease)   . Left anterior fascicular block    PATIENT SAYS THIS SHOWED ON EKG DURING COLONOSCOPY , WAS REFERRED TO CARDIOLOGY DR. Minus Breeding  (SEE EPIC ENCOUNTER 09-2017)       Past Surgical History:  Procedure Laterality  Date  . ABDOMINAL HYSTERECTOMY    . BIOPSY  08/09/2017   Procedure: BIOPSY;  Surgeon: Danie Binder, MD;  [facility];  Service: Endoscopy;;  gastric biopsy for h pylori  . CATARACT EXTRACTION, BILATERAL     DID 1 YEAR APART , LAST ONE WAS 12-2017  . COLONOSCOPY WITH ESOPHAGOGASTRODUODENOSCOPY (EGD)  2014   [place] PROPOFOL N/A 08/09/2017   3 simple adenomas, diverticulosis in recto-sigmoid, sigmoid, and descending colin, moderate external and internal hemorrhoids  . ESOPHAGOGASTRODUODENOSCOPY (EGD) WITH PROPOFOL N/A 08/09/2017   Low-grade narrowing Schatzki ring due to GERD; small hiatal hernia; mild gastritis and duodenitis due to NSAIDs; biopsy with gastritis  . KNEE ARTHROSCOPY Right 11/2017   WITH DR Wynelle Link  AT [facility] Right   . POLYPECTOMY  08/09/2017   Procedure: POLYPECTOMY;  Surgeon: Danie Binder, MD;  Location: [facility];  Service: Endoscopy;;  cecal polyp hs, transverse colon polyp hs, rectal polyp cs  . TOTAL KNEE REVISION Right 03/01/2018   Procedure: RIGHT TOTAL KNEE REVISION;  Surgeon: Gaynelle Arabian, MD;  Location: [facility];  Service: Orthopedics;  Laterality: Right;  171min    Current Outpatient Medications  Medication Sig Dispense Refill  . CALCIUM PO Take 1 tablet by mouth 2 (two) times daily.     . Cholecalciferol (VITAMIN D3) 125 MCG (5000 UT) CAPS Take 5,000 Units by mouth 2 (two) times daily.    Marland Kitchen estradiol (CLIMARA - DOSED IN MG/24 HR) 0.1 mg/24hr patch Place 0.1 mg onto the skin once  a week.     . lansoprazole (PREVACID) 30 MG capsule 1 po 30 mins prior to first meal AND LAST MEAL (Patient taking differently: Take 30 mg by mouth daily. Sometimes takes twice a day) 60 capsule 11  . linaclotide (LINZESS) 145 MCG CAPS capsule Take 1 capsule (145 mcg total) by mouth daily before breakfast. (Patient taking differently: Take 145 mcg by mouth as needed. ) 90 capsule 3  . OVER THE COUNTER  MEDICATION Take 1-2 capsules by mouth See admin instructions. Omega 3/Red Yeast Rice Take 1 capsule by mouth in the morning and take 2 capsules by mouth at bedtime    . polyethylene glycol (MIRALAX / GLYCOLAX) 17 g packet Take 17 g by mouth daily as needed.    . Vitamin D-Vitamin K (K2 PLUS D3 PO) Take by mouth daily.     No current facility-administered medications for this visit.     Allergies as of 09/14/2018 - Review Complete 09/14/2018  Allergen Reaction Noted  . Sulfa antibiotics Anaphylaxis and Other (See Comments) 01/27/2016    Family History  Problem Relation Age of Onset  . Alcohol abuse Mother   . Colon cancer Neg Hx   . Breast cancer Neg Hx     Social History   Socioeconomic History  . Marital status: Married    Spouse name: Not on file  . Number of children: Not on file  . Years of education: Not on file  . Highest education level: Not on file  Occupational History  . Occupation: Retired     Comment: Glass blower/designer  Social Needs  . Financial resource strain: Not on file  . Food insecurity    Worry: Not on file    Inability: Not on file  . Transportation needs    Medical: Not on file    Non-medical: Not on file  Tobacco Use  . Smoking status: Former Smoker    Types: Cigarettes    Quit date: 01/27/1971    Years since quitting: 47.6  . Smokeless tobacco: Never Used  Substance and Sexual Activity  . Alcohol use: Yes    Comment: Occasional: 1-2 times/month  . Drug use: Yes    Frequency: 7.0 times per week    Types: Marijuana    Comment: 09/14/18-last used marijuana 09/13/18  . Sexual activity: Not on file  Lifestyle  . Physical activity    Days per week: Not on file    Minutes per session: Not on file  . Stress: Not on file  Relationships  . Social Herbalist on phone: Not on file    Gets together: Not on file    Attends religious service: Not on file    Active member of club or organization: Not on file    Attends meetings of clubs or  organizations: Not on file    Relationship status: Not on file  Other Topics Concern  . Not on file  Social History Narrative   Lives at home with husband.    Review of Systems: Gen: Denies fever, chills, fatigue, weakness, lightheadedness, dizziness CV: Denies chest pain, palpitations, syncope. Admits minimal peripheral edema since her knee replacement. This has improved. Resp: Denies dyspnea at rest, cough, wheezing GI: See HPI Derm: Denies rash, itching, dry skin Psych: Denies depression, anxiety Heme: Denies bruising, bleeding  Physical Exam: BP 115/72   Pulse 60   Temp (!) 96.6 F (35.9 C) (Oral)   Ht 5\' 3"  (1.6 m)   Wt 166  lb 9.6 oz (75.6 kg)   BMI 29.51 kg/m  General:   Alert and oriented. No distress noted. Pleasant and cooperative.  Head:  Normocephalic and atraumatic. Eyes:  Conjuctiva clear without scleral icterus. Heart:  S1, S2 present without murmurs appreciated. Lungs:  Clear to auscultation bilaterally. No wheezes, rales, or rhonchi. No distress.  Abdomen:  +BS, soft, non-tender and non-distended. No rebound or guarding. No HSM or masses noted. Msk:  Symmetrical without gross deformities. Normal posture. Extremities:  Without edema. Neurologic:  Alert and  oriented x4 Psych:  Alert and cooperative. Normal mood and affect.

## 2018-09-14 ENCOUNTER — Encounter: Payer: Self-pay | Admitting: Gastroenterology

## 2018-09-14 ENCOUNTER — Ambulatory Visit (INDEPENDENT_AMBULATORY_CARE_PROVIDER_SITE_OTHER): Payer: 59 | Admitting: Gastroenterology

## 2018-09-14 ENCOUNTER — Other Ambulatory Visit: Payer: Self-pay

## 2018-09-14 VITALS — BP 115/72 | HR 60 | Temp 96.6°F | Ht 63.0 in | Wt 166.6 lb

## 2018-09-14 DIAGNOSIS — K219 Gastro-esophageal reflux disease without esophagitis: Secondary | ICD-10-CM | POA: Insufficient documentation

## 2018-09-14 DIAGNOSIS — K5900 Constipation, unspecified: Secondary | ICD-10-CM | POA: Diagnosis not present

## 2018-09-14 NOTE — Assessment & Plan Note (Signed)
Patient with history of constipation. Now well managed on MiraLAX or Linzess daily. States MiraLAX works well for her and doesn't give her urgency like Linzess. No hematochezia or melena. No straining. Has been trying to eat healthier and exercise more. Colonoscopy on 08/09/17 with 3 simple adenomas, diverticulosis in recto-sigmoid, sigmoid, and descending colon, moderate external and internal hemorrhoids.  Continue MiraLAX 17g  and Linzess 145 mcg.  May take MiraLAX 17g with at least 8 ounces of water daily if this works better and use Linzess 145 mcg as needed.  Colonoscopy due in 2022.

## 2018-09-14 NOTE — Assessment & Plan Note (Addendum)
Chronic history of GERD. EGD on 08/09/17 with low-grade narrowing Schatzki ring due to GERD; small hiatal hernia; mild gastritis and duodenitis due to NSAIDs. Placed on Prevacid 30 mg BID. Patient has now decreased Prevacid to once daily and only taking a second dose if she knows she is eating something that will cause symptoms like acidic foods. Symptoms are well controlled at this time. No dysphagia, epigastric pain, unintentional weight loss, or melena.   Continue Prevacid 30 mg daily taking a second dose as needed.   Follow up in 1 year

## 2018-09-14 NOTE — Patient Instructions (Addendum)
1. Continue taking Prevacid 30 mg daily. You may continue taking this once a day with a second as needed as your symptoms seem to be well controlled at this time.   2. Continue MiraLAX and Linzess for constipation. Taking MiraLAX daily is ok.   We will see you back in 1 year. Call if you have concerns prior to your next visit.   Aliene Altes, PA-C [facility]

## 2018-09-18 NOTE — Progress Notes (Signed)
CC'D TO PCP °

## 2018-09-26 ENCOUNTER — Other Ambulatory Visit: Payer: Self-pay

## 2018-09-27 MED ORDER — LANSOPRAZOLE 30 MG PO CPDR: DELAYED_RELEASE_CAPSULE | ORAL | 11 refills | Status: DC

## 2018-11-14 DIAGNOSIS — G610 Guillain-Barre syndrome: Secondary | ICD-10-CM

## 2018-11-14 HISTORY — DX: Guillain-Barre syndrome: G61.0

## 2018-12-07 HISTORY — PX: CARPAL TUNNEL RELEASE: SHX101

## 2018-12-08 ENCOUNTER — Emergency Department (HOSPITAL_COMMUNITY): Payer: 59

## 2018-12-08 ENCOUNTER — Other Ambulatory Visit: Payer: Self-pay

## 2018-12-08 ENCOUNTER — Encounter (HOSPITAL_COMMUNITY): Payer: Self-pay | Admitting: *Deleted

## 2018-12-08 ENCOUNTER — Inpatient Hospital Stay (HOSPITAL_COMMUNITY)
Admission: EM | Admit: 2018-12-08 | Discharge: 2018-12-15 | DRG: 095 | Disposition: A | Discharge status: Rehabilitation Facility | Payer: 59 | Attending: Internal Medicine | Admitting: Internal Medicine

## 2018-12-08 DIAGNOSIS — G8194 Hemiplegia, unspecified affecting left nondominant side: Secondary | ICD-10-CM | POA: Diagnosis present

## 2018-12-08 DIAGNOSIS — Z86010 Personal history of colonic polyps: Secondary | ICD-10-CM

## 2018-12-08 DIAGNOSIS — Z20828 Contact with and (suspected) exposure to other viral communicable diseases: Secondary | ICD-10-CM | POA: Diagnosis present

## 2018-12-08 DIAGNOSIS — Z90710 Acquired absence of both cervix and uterus: Secondary | ICD-10-CM

## 2018-12-08 DIAGNOSIS — K219 Gastro-esophageal reflux disease without esophagitis: Secondary | ICD-10-CM | POA: Diagnosis present

## 2018-12-08 DIAGNOSIS — D72829 Elevated white blood cell count, unspecified: Secondary | ICD-10-CM | POA: Diagnosis present

## 2018-12-08 DIAGNOSIS — M1990 Unspecified osteoarthritis, unspecified site: Secondary | ICD-10-CM | POA: Diagnosis present

## 2018-12-08 DIAGNOSIS — Z79899 Other long term (current) drug therapy: Secondary | ICD-10-CM

## 2018-12-08 DIAGNOSIS — E871 Hypo-osmolality and hyponatremia: Secondary | ICD-10-CM | POA: Diagnosis present

## 2018-12-08 DIAGNOSIS — R29898 Other symptoms and signs involving the musculoskeletal system: Secondary | ICD-10-CM | POA: Diagnosis not present

## 2018-12-08 DIAGNOSIS — G8191 Hemiplegia, unspecified affecting right dominant side: Secondary | ICD-10-CM | POA: Diagnosis present

## 2018-12-08 DIAGNOSIS — M4802 Spinal stenosis, cervical region: Secondary | ICD-10-CM | POA: Diagnosis present

## 2018-12-08 DIAGNOSIS — G610 Guillain-Barre syndrome: Secondary | ICD-10-CM | POA: Diagnosis present

## 2018-12-08 DIAGNOSIS — G650 Sequelae of Guillain-Barre syndrome: Secondary | ICD-10-CM | POA: Diagnosis present

## 2018-12-08 DIAGNOSIS — Z79890 Hormone replacement therapy (postmenopausal): Secondary | ICD-10-CM

## 2018-12-08 DIAGNOSIS — Z8719 Personal history of other diseases of the digestive system: Secondary | ICD-10-CM | POA: Diagnosis not present

## 2018-12-08 DIAGNOSIS — I444 Left anterior fascicular block: Secondary | ICD-10-CM | POA: Diagnosis present

## 2018-12-08 DIAGNOSIS — K589 Irritable bowel syndrome without diarrhea: Secondary | ICD-10-CM | POA: Diagnosis present

## 2018-12-08 DIAGNOSIS — K5909 Other constipation: Secondary | ICD-10-CM | POA: Diagnosis present

## 2018-12-08 DIAGNOSIS — Z882 Allergy status to sulfonamides status: Secondary | ICD-10-CM | POA: Diagnosis not present

## 2018-12-08 DIAGNOSIS — E785 Hyperlipidemia, unspecified: Secondary | ICD-10-CM | POA: Diagnosis present

## 2018-12-08 DIAGNOSIS — K581 Irritable bowel syndrome with constipation: Secondary | ICD-10-CM | POA: Diagnosis present

## 2018-12-08 DIAGNOSIS — Z87891 Personal history of nicotine dependence: Secondary | ICD-10-CM | POA: Diagnosis not present

## 2018-12-08 DIAGNOSIS — Z811 Family history of alcohol abuse and dependence: Secondary | ICD-10-CM | POA: Diagnosis not present

## 2018-12-08 DIAGNOSIS — R531 Weakness: Secondary | ICD-10-CM | POA: Diagnosis present

## 2018-12-08 DIAGNOSIS — G8220 Paraplegia, unspecified: Secondary | ICD-10-CM | POA: Diagnosis not present

## 2018-12-08 LAB — BASIC METABOLIC PANEL
Anion gap: 11 (ref 5–15)
BUN: 15 mg/dL (ref 8–23)
CO2: 22 mmol/L (ref 22–32)
Calcium: 9.6 mg/dL (ref 8.9–10.3)
Chloride: 107 mmol/L (ref 98–111)
Creatinine, Ser: 0.9 mg/dL (ref 0.44–1.00)
GFR calc Af Amer: >60 mL/min (ref >60)
GFR calc non Af Amer: >60 mL/min (ref >60)
Glucose, Bld: 135 mg/dL — ABNORMAL HIGH (ref 70–99)
Potassium: 3.6 mmol/L (ref 3.5–5.1)
Sodium: 140 mmol/L (ref 135–145)

## 2018-12-08 LAB — CSF CELL COUNT WITH DIFFERENTIAL
RBC Count, CSF: 220 /mm3 — ABNORMAL HIGH
RBC Count, CSF: 4 /mm3 — ABNORMAL HIGH
Tube #: 1
Tube #: 4
WBC, CSF: 1 /mm3 (ref 0–5)
WBC, CSF: 4 /mm3 (ref 0–5)

## 2018-12-08 LAB — CBC WITH DIFFERENTIAL/PLATELET
Abs Immature Granulocytes: 0.09 10*3/uL — ABNORMAL HIGH (ref 0.00–0.07)
Basophils Absolute: 0 10*3/uL (ref 0.0–0.1)
Basophils Relative: 0 %
Eosinophils Absolute: 0 10*3/uL (ref 0.0–0.5)
Eosinophils Relative: 0 %
HCT: 47.3 % — ABNORMAL HIGH (ref 36.0–46.0)
Hemoglobin: 15 g/dL (ref 12.0–15.0)
Immature Granulocytes: 1 %
Lymphocytes Relative: 16 %
Lymphs Abs: 2.3 10*3/uL (ref 0.7–4.0)
MCH: 29.6 pg (ref 26.0–34.0)
MCHC: 31.7 g/dL (ref 30.0–36.0)
MCV: 93.5 fL (ref 80.0–100.0)
Monocytes Absolute: 0.7 10*3/uL (ref 0.1–1.0)
Monocytes Relative: 5 %
Neutro Abs: 11.6 10*3/uL — ABNORMAL HIGH (ref 1.7–7.7)
Neutrophils Relative %: 78 %
Platelets: 269 10*3/uL (ref 150–400)
RBC: 5.06 MIL/uL (ref 3.87–5.11)
RDW: 14.7 % (ref 11.5–15.5)
WBC: 14.8 10*3/uL — ABNORMAL HIGH (ref 4.0–10.5)
nRBC: 0 % (ref 0.0–0.2)

## 2018-12-08 LAB — URINALYSIS, ROUTINE W REFLEX MICROSCOPIC
Bilirubin Urine: NEGATIVE
Glucose, UA: NEGATIVE mg/dL
Hgb urine dipstick: NEGATIVE
Ketones, ur: NEGATIVE mg/dL
Leukocytes,Ua: NEGATIVE
Nitrite: NEGATIVE
Protein, ur: NEGATIVE mg/dL
Specific Gravity, Urine: 1.015 (ref 1.005–1.030)
pH: 5 (ref 5.0–8.0)

## 2018-12-08 LAB — PROTEIN AND GLUCOSE, CSF
Glucose, CSF: 66 mg/dL (ref 40–70)
Total  Protein, CSF: 65 mg/dL — ABNORMAL HIGH (ref 15–45)

## 2018-12-08 LAB — MAGNESIUM: Magnesium: 2.5 mg/dL — ABNORMAL HIGH (ref 1.7–2.4)

## 2018-12-08 LAB — TSH: TSH: 2.052 u[IU]/mL (ref 0.350–4.500)

## 2018-12-08 LAB — SARS CORONAVIRUS 2 BY RT PCR (HOSPITAL ORDER, PERFORMED IN ~~LOC~~ HOSPITAL LAB): SARS Coronavirus 2: NEGATIVE

## 2018-12-08 MED ORDER — KETOROLAC TROMETHAMINE 30 MG/ML IJ SOLN
30.0000 mg | Freq: Once | INTRAMUSCULAR | Status: AC
  Administered 2018-12-08: 19:00:00 30 mg via INTRAVENOUS
  Filled 2018-12-08: qty 1

## 2018-12-08 MED ORDER — ONDANSETRON HCL 4 MG/2ML IJ SOLN: 4.0000 mg | Freq: Four times a day (QID) | INTRAMUSCULAR | Status: DC | PRN

## 2018-12-08 MED ORDER — ESTRADIOL 0.1 MG/24HR TD PTWK
0.1000 mg | MEDICATED_PATCH | TRANSDERMAL | Status: DC
  Filled 2018-12-08 (×3): qty 1

## 2018-12-08 MED ORDER — LINACLOTIDE 145 MCG PO CAPS
145.0000 ug | ORAL_CAPSULE | Freq: Every day | ORAL | Status: DC | PRN
  Filled 2018-12-08 (×2): qty 1

## 2018-12-08 MED ORDER — IMMUNE GLOBULIN (HUMAN) 10 GM/100ML IV SOLN
400.0000 mg/kg | INTRAVENOUS | Status: AC
  Administered 2018-12-08 – 2018-12-11 (×4): 30 g via INTRAVENOUS
  Filled 2018-12-08 (×4): qty 300
  Filled 2018-12-08: qty 200

## 2018-12-08 MED ORDER — PANTOPRAZOLE SODIUM 40 MG PO TBEC
40.0000 mg | DELAYED_RELEASE_TABLET | Freq: Every day | ORAL | Status: DC
  Administered 2018-12-08 – 2018-12-15 (×8): 40 mg via ORAL
  Filled 2018-12-08 (×9): qty 1

## 2018-12-08 MED ORDER — ONDANSETRON HCL 4 MG PO TABS: 4.0000 mg | ORAL_TABLET | Freq: Four times a day (QID) | ORAL | Status: DC | PRN

## 2018-12-08 MED ORDER — POLYETHYLENE GLYCOL 3350 17 G PO PACK
17.0000 g | PACK | Freq: Every day | ORAL | Status: DC | PRN
  Administered 2018-12-08: 17 g via ORAL
  Filled 2018-12-08: qty 1

## 2018-12-08 MED ORDER — KETOROLAC TROMETHAMINE 30 MG/ML IJ SOLN
30.0000 mg | Freq: Once | INTRAMUSCULAR | Status: AC
  Administered 2018-12-08: 30 mg via INTRAVENOUS
  Filled 2018-12-08: qty 1

## 2018-12-08 NOTE — ED Notes (Signed)
Vital capacity  Nif = negative 80 cm of H2O Forced vital 1.35 liters

## 2018-12-08 NOTE — Procedures (Signed)
Preprocedure Dx: BILATERAL lower extremity weakness Postprocedure Dx: BILATERAL lower extremity weakness Procedure:  Fluoroscopically guided lumbar puncture Radiologist:  Thornton Papas Anesthesia:  2 ml of 1% lidocaine Specimen:  12.5 ml CSF, clear colorless EBL:   < 1 ml Opening pressure: 15 cm [id number] Complications: None

## 2018-12-08 NOTE — ED Triage Notes (Signed)
Pt c/o cramping that started yesterday in right buttocks with pain then going to lower back. Then this morning cramping pain in abdomen and down both legs with weakness and numbness in bilateral legs that started at 0600. Dr. Reather Converse notified of pt's symptoms.

## 2018-12-08 NOTE — ED Provider Notes (Signed)
[facility] EMERGENCY DEPARTMENT Provider Note   CSN: [id number] Arrival date & time: 12/08/18  [id number]   History   Chief Complaint Chief Complaint  Patient presents with  . Numbness    bilateral legs    HPI Paula Sutton is a 63 y.o. female day 1 s/p right wrist surgery for carpal tunnel presenting after she felt a strong cramping pain in her right buttock yesterday that moved to her low back.  This morning the patient continues to have cramping pain in her abdomen that spread down both legs, she then had weakness and numbness in both of her legs started about 6:00 this morning.  She has not tried any medications or treatments that have made her pain or numbness/tingling better or worse.  She denies taking any new medications other than hydrocodone which she started taking yesterday after having her procedure.  She denies having had a flu shot or any vaccination recently.  She denies headaches, chest pain, shortness of breath, nausea, vomiting, diarrhea, and rashes.  She states she has a history of IBS and is constipated at baseline. Denies loss of bladder or bowel function. Denies any mechanism of injury, trauma or fall.    Past Medical History:  Diagnosis Date  . Arthritis    knee  . Dyslipidemia   . GERD (gastroesophageal reflux disease)   . Left anterior fascicular block    PATIENT SAYS THIS SHOWED ON EKG DURING COLONOSCOPY , WAS REFERRED TO CARDIOLOGY DR. Minus Breeding  (SEE EPIC ENCOUNTER 09-2017)       Patient Active Problem List   Diagnosis Date Noted  . GERD (gastroesophageal reflux disease) 09/14/2018  . 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 Abdominal pain, epigastric 06/03/2017  . Constipation 01/27/2016  . History of adenomatous polyp of colon 01/27/2016    Past Surgical History:  Procedure Laterality Date  . ABDOMINAL HYSTERECTOMY    . BIOPSY  08/09/2017   Procedure: BIOPSY;  Surgeon: Danie Binder, MD;  [facility]: [facility];  Service:  Endoscopy;;  gastric biopsy for h pylori  . CARPAL TUNNEL RELEASE Right 12/07/2018  . CATARACT EXTRACTION, BILATERAL     DID 1 YEAR APART , LAST ONE WAS 12-2017  . COLONOSCOPY WITH ESOPHAGOGASTRODUODENOSCOPY (EGD)  2014   [place] PROPOFOL N/A 08/09/2017   3 simple adenomas, diverticulosis in recto-sigmoid, sigmoid, and descending colin, moderate external and internal hemorrhoids  . ESOPHAGOGASTRODUODENOSCOPY (EGD) WITH PROPOFOL N/A 08/09/2017   Low-grade narrowing Schatzki ring due to GERD; small hiatal hernia; mild gastritis and duodenitis due to NSAIDs; biopsy with gastritis  . KNEE ARTHROSCOPY Right 11/2017   WITH DR Wynelle Link  AT [place] Right   . POLYPECTOMY  08/09/2017   Procedure: POLYPECTOMY;  Surgeon: Danie Binder, MD;  [facility]: [facility];  Service: Endoscopy;;  cecal polyp hs, transverse colon polyp hs, rectal polyp cs  . TOTAL KNEE REVISION Right 03/01/2018   Procedure: RIGHT TOTAL KNEE REVISION;  Surgeon: Gaynelle Arabian, MD;  Location: [facility];  Service: Orthopedics;  Laterality: Right;  178min     OB History   No obstetric history on file.      Home Medications    Prior to Admission medications   Medication Sig Start Date End Date Taking? Authorizing Provider  CALCIUM PO Take 1 tablet by mouth 2 (two) times daily.     [provider]  Cholecalciferol (VITAMIN D3) 125 MCG (5000  UT) CAPS Take 5,000 Units by mouth 2 (two) times daily.    [provider]  estradiol (CLIMARA - DOSED IN MG/24 HR) 0.1 mg/24hr patch Place 0.1 mg onto the skin once a week.  01/08/16   [provider]  lansoprazole (PREVACID) 30 MG capsule 1 po 30 mins prior to first meal AND LAST MEAL 09/27/18   Carlis Stable, NP  linaclotide (LINZESS) 145 MCG CAPS capsule Take 1 capsule (145 mcg total) by mouth daily before breakfast. Patient taking differently: Take 145 mcg by mouth as needed.  02/13/18   Annitta Needs, NP   OVER THE COUNTER MEDICATION Take 1-2 capsules by mouth See admin instructions. Omega 3/Red Yeast Rice Take 1 capsule by mouth in the morning and take 2 capsules by mouth at bedtime    [provider]  polyethylene glycol (MIRALAX / GLYCOLAX) 17 g packet Take 17 g by mouth daily as needed.    [provider]  Vitamin D-Vitamin K (K2 PLUS D3 PO) Take by mouth daily.    [provider]    Family History Family History  Problem Relation Age of Onset  . Alcohol abuse Mother   . Colon cancer Neg Hx   . Breast cancer Neg Hx     Social History Social History   Tobacco Use  . Smoking status: Former Smoker    Types: Cigarettes    Quit date: 01/27/1971    Years since quitting: 47.8  . Smokeless tobacco: Never Used  Substance Use Topics  . Alcohol use: Yes    Comment: Occasional: 1-2 times/month  . Drug use: Yes    Frequency: 7.0 times per week    Types: Marijuana    Allergies   Sulfa antibiotics   Review of Systems Review of Systems - see HPI   Physical Exam Updated Vital Signs BP (!) 126/92   Pulse (!) 42   Temp 97.6 F (36.4 C) (Oral)   Resp 16   Ht 5\' 3"  (1.6 m)   Wt 72.6 kg   SpO2 100%   BMI 28.34 kg/m   Physical Exam Constitutional:      Appearance: She is not toxic-appearing.  HENT:     Head: Normocephalic and atraumatic.     Nose: Nose normal.     Mouth/Throat:     Mouth: Mucous membranes are dry.  Eyes:     Extraocular Movements: Extraocular movements intact.  Neck:     Musculoskeletal: Normal range of motion.  Cardiovascular:     Rate and Rhythm: Regular rhythm. Bradycardia present.     Pulses: Normal pulses.     Heart sounds: Normal heart sounds. No murmur.  Pulmonary:     Effort: Pulmonary effort is normal. No respiratory distress.     Breath sounds: Normal breath sounds.  Abdominal:     General: Bowel sounds are normal.     Palpations: Abdomen is soft.  Musculoskeletal: Normal range of motion.        General: No  swelling.     Right lower leg: No edema.     Left lower leg: No edema.  Skin:    Capillary Refill: Capillary refill takes less than 2 seconds.     Findings: No lesion or rash.  Neurological:     Mental Status: She is alert.     Cranial Nerves: No cranial nerve deficit.     Sensory: Sensory deficit (Numbness to bilateral LE, normal sensation to bilateral UEs) present.  Motor: Weakness (1/5 in feed dorsi-/plantar flexion, 0/5 in hip/knee motion) present.     Coordination: Coordination normal (tested in bilateral UEs.).  Psychiatric:        Mood and Affect: Mood normal.        Behavior: Behavior normal.    ED Treatments / Results  Labs (all labs ordered are listed, but only abnormal results are displayed) Labs Reviewed  BASIC METABOLIC PANEL - Abnormal; Notable for the following components:      Result Value   Glucose, Bld 135 (*)    All other components within normal limits  CBC WITH DIFFERENTIAL/PLATELET - Abnormal; Notable for the following components:   WBC 14.8 (*)    HCT 47.3 (*)    Neutro Abs 11.6 (*)    Abs Immature Granulocytes 0.09 (*)    All other components within normal limits  MAGNESIUM  TSH  URINALYSIS, ROUTINE W REFLEX MICROSCOPIC    EKG None  Radiology No results found.  Procedures Procedures (including critical care time)  Medications Ordered in ED Medications  ketorolac (TORADOL) 30 MG/ML injection 30 mg (has no administration in time range)    Initial Impression / Assessment and Plan / ED Course  I have reviewed the triage vital signs and the nursing notes.  Pertinent labs & imaging results that were available during my care of the patient were reviewed by me and considered in my medical decision making (see chart for details).  Bilateral LE Numbness/Weakness: patient with acute bilateral LE numbness/tingling and weakness.  Patient has leukocytosis of 14.8 with increased absolute neutrophils.  Vitals are stable, patient afebrile.  Patient's  upper extremities and cranial nerves are neurologically intact and equal bilaterally.  Patient with nondescript sensation, numbness and tingling to bilateral lower extremities, 1/5 strength at best exhibited in lower extremities on physical exam.  Findings concerning for mass or infarct of spinal cord, however MRI is negative for such findings. No MRI findings suggestive of MS. Clinical picture does not fit ALS. Additional concerns include Guillain-Barr syndrome. -MRI of cervical, thoracic, and lumbar spine - negative for etiology of patient's numbness/tingling/weakness -Given Toradol 30 mg x 1 for pain -COVID testing for admission -Magnesium, TSH, electrolytes - within normal limits -Urinalysis negative  Final Clinical Impressions(s) / ED Diagnoses   Final diagnoses:  None    ED Discharge Orders    None      Milus Banister, [place], PGY-2 12/08/2018 11:00 AM\    Daisy Floro, DO 12/08/18 1549    Elnora Morrison, MD 12/08/18 [phone]

## 2018-12-08 NOTE — ED Provider Notes (Addendum)
Interim Progress Note  S: Patient seen resting in bed, no apparent distress. Informed of admission to ICU for concern for/rule out of Guillain Barre Syndrome.   RADIOLOGIST DR. Thornton Papas HAS BEEN CONTACTED AND WILL PERFORM LUMBAR PUNCTURE.   O: BP (!) 165/74   Pulse (!) 40   Temp 97.6 F (36.4 C) (Oral)   Resp 16   Ht 5\' 3"  (1.6 m)   Wt 72.6 kg   SpO2 99%   BMI 28.34 kg/m    A/P:  1. Admit to ICU for concerns of Guillain Barre Syndrome 2. Neurology consulted, recommending [place]   Daisy Floro, DO 12/08/2018, 3:28 PM PGY-2, [place], [place], DO 12/08/18 1556    Elnora Morrison, MD 12/08/18 1600

## 2018-12-08 NOTE — H&P (Signed)
History and Physical  PADME POPHAM [id number] DOB: [birth year] DOA: 12/08/2018  Referring physician: Dr Reather Converse, ED physician PCP: Celene Squibb, MD  Outpatient Specialists:   Patient Coming From: home  Chief Complaint: weakness of lower extremities bilaterally  HPI: Paula Sutton is a 63 y.o. female with a history of GERD, dyslipidemia, arthritis, recent carpal tunnel release on the right upper extremity.  Patient seen for sudden onset of lower extremity weakness with paresthesias and cramping that started this morning around 6 AM.  She reports no new medications other than hydrocodone that she started yesterday after her carpal tunnel release.  Left lower extremity weakness is greater than right.  No palliating or provoking factors.  She has not had a flu or any other vaccine recently.  She denies diarrhea, nausea, vomiting, headache, chest pain, shortness of breath.  No rashes.  No loss of bladder or bowel function and reports no new injuries, trauma, or fall.  Emergency Department Course: MRI of entire spine negative.  White count of 14.8 with ANC of 11.6.  BMP normal.  UA normal, thyroid normal  Review of Systems:   Pt denies any fevers, chills, nausea, vomiting, diarrhea, constipation, abdominal pain, shortness of breath, dyspnea on exertion, orthopnea, cough, wheezing, palpitations, headache, vision changes, lightheadedness, dizziness, melena, rectal bleeding.  Review of systems are otherwise negative  Past Medical History:  Diagnosis Date   Arthritis    knee   Dyslipidemia    GERD (gastroesophageal reflux disease)    Left anterior fascicular block    PATIENT SAYS THIS SHOWED ON EKG DURING COLONOSCOPY , WAS REFERRED TO CARDIOLOGY DR. Minus Breeding  (SEE EPIC ENCOUNTER 09-2017)      Past Surgical History:  Procedure Laterality Date   ABDOMINAL HYSTERECTOMY     BIOPSY  08/09/2017   Procedure: BIOPSY;  Surgeon: Danie Binder, MD;  Location: [facility];  Service:  Endoscopy;;  gastric biopsy for h pylori   CARPAL TUNNEL RELEASE Right 12/07/2018   CATARACT EXTRACTION, BILATERAL     DID 1 YEAR APART , LAST ONE WAS 12-2017   COLONOSCOPY WITH ESOPHAGOGASTRODUODENOSCOPY (EGD)  2014   [facility]   COLONOSCOPY WITH PROPOFOL N/A 08/09/2017   3 simple adenomas, diverticulosis in recto-sigmoid, sigmoid, and descending colin, moderate external and internal hemorrhoids   ESOPHAGOGASTRODUODENOSCOPY (EGD) WITH PROPOFOL N/A 08/09/2017   Low-grade narrowing Schatzki ring due to GERD; small hiatal hernia; mild gastritis and duodenitis due to NSAIDs; biopsy with gastritis   KNEE ARTHROSCOPY Right 11/2017   WITH DR Wynelle Link  [facility]    KNEE SURGERY Right    POLYPECTOMY  08/09/2017   Procedure: POLYPECTOMY;  Surgeon: Danie Binder, MD;  Location: [facility];  Service: Endoscopy;;  cecal polyp hs, transverse colon polyp hs, rectal polyp cs   TOTAL KNEE REVISION Right 03/01/2018   Procedure: RIGHT TOTAL KNEE REVISION;  Surgeon: Gaynelle Arabian, MD;  Location: [facility];  Service: Orthopedics;  Laterality: Right;  188min   Social History:  reports that she quit smoking about 47 years ago. Her smoking use included cigarettes. She has never used smokeless tobacco. She reports current alcohol use. She reports current drug use. Frequency: 7.00 times per week. Drug: Marijuana. Patient lives at home  Allergies  Allergen Reactions   Sulfa Antibiotics Anaphylaxis and Other (See Comments)    Tongue swelling     Family History  Problem Relation Age of Onset   Alcohol abuse Mother  Colon cancer Neg Hx    Breast cancer Neg Hx       Prior to Admission medications   Medication Sig Start Date End Date Taking? Authorizing Provider  CALCIUM PO Take 1 tablet by mouth 2 (two) times daily.    Yes [provider]  Cholecalciferol (VITAMIN D3) 125 MCG (5000 UT) CAPS Take 5,000 Units by mouth 2 (two) times daily.   Yes [provider]  estradiol (CLIMARA - DOSED IN MG/24 HR) 0.1 mg/24hr patch Place 0.1 mg onto the skin once a week.  01/08/16  Yes [provider]  lansoprazole (PREVACID) 30 MG capsule 1 po 30 mins prior to first meal AND LAST MEAL 09/27/18  Yes Carlis Stable, NP  linaclotide (LINZESS) 145 MCG CAPS capsule Take 1 capsule (145 mcg total) by mouth daily before breakfast. Patient taking differently: Take 145 mcg by mouth as needed.  02/13/18  Yes Annitta Needs, NP  OVER THE COUNTER MEDICATION Take 1-2 capsules by mouth See admin instructions. Omega 3/Red Yeast Rice Take 1 capsule by mouth in the morning and take 2 capsules by mouth at bedtime   Yes [provider]  polyethylene glycol (MIRALAX / GLYCOLAX) 17 g packet Take 17 g by mouth daily as needed.   Yes [provider]  Vitamin D-Vitamin K (K2 PLUS D3 PO) Take by mouth daily.   Yes [provider]    Physical Exam: BP (!) 165/74    Pulse (!) 40    Temp 97.6 F (36.4 C) (Oral)    Resp 16    Ht 5\' 3"  (1.6 m)    Wt 72.6 kg    SpO2 99%    BMI 28.34 kg/m    General: Slightly older female. Awake and alert and oriented x3. No acute cardiopulmonary distress.   HEENT: Normocephalic atraumatic.  Right and left ears normal in appearance.  Pupils equal, round, reactive to light. Extraocular muscles are intact. Sclerae anicteric and noninjected.  Moist mucosal membranes. No mucosal lesions.   Neck: Neck supple without lymphadenopathy. No carotid bruits. No masses palpated.   Cardiovascular: Regular rate with normal S1-S2 sounds. No murmurs, rubs, gallops auscultated. No JVD.   Respiratory: Good respiratory effort with no wheezes, rales, rhonchi. Lungs clear to auscultation bilaterally.  No accessory muscle use.  Abdomen: Soft, nontender, nondistended. Active bowel sounds. No masses or hepatosplenomegaly   Skin: No rashes, lesions, or ulcerations.  Dry, warm to touch. 2+ dorsalis pedis and radial pulses.  Musculoskeletal: No  calf or leg pain. All major joints not erythematous nontender.  No upper or lower joint deformation.  Good ROM.  No contractures   Psychiatric: Intact judgment and insight. Pleasant and cooperative.  Neurologic: Strength is 2 out of 5 in lower extremities bilateral, left slightly worse than right.  Sensation is intact to light touch, painful stimuli, and proprioceptive.  Cranial nerves II through XII are grossly intact.  Upper extremities intact globally.           Labs on Admission: I have personally reviewed following labs and imaging studies  CBC: Recent Labs  Lab 12/08/18 0855  WBC 14.8*  NEUTROABS 11.6*  HGB 15.0  HCT 47.3*  MCV 93.5  PLT [id number]   Basic Metabolic Panel: Recent Labs  Lab 12/08/18 0855  NA 140  K 3.6  CL 107  CO2 22  GLUCOSE 135*  BUN 15  CREATININE 0.90  CALCIUM 9.6  MG 2.5*   GFR: Estimated Creatinine Clearance:  59.5 mL/min (by C-G formula based on SCr of 0.9 mg/dL). Liver Function Tests: No results for input(s): AST, ALT, ALKPHOS, BILITOT, PROT, ALBUMIN in the last 168 hours. No results for input(s): LIPASE, AMYLASE in the last 168 hours. No results for input(s): AMMONIA in the last 168 hours. Coagulation Profile: No results for input(s): INR, PROTIME in the last 168 hours. Cardiac Enzymes: No results for input(s): CKTOTAL, CKMB, CKMBINDEX, TROPONINI in the last 168 hours. BNP (last 3 results) No results for input(s): PROBNP in the last 8760 hours. HbA1C: No results for input(s): HGBA1C in the last 72 hours. CBG: No results for input(s): GLUCAP in the last 168 hours. Lipid Profile: No results for input(s): CHOL, HDL, LDLCALC, TRIG, CHOLHDL, LDLDIRECT in the last 72 hours. Thyroid Function Tests: Recent Labs    12/08/18 0855  TSH 2.052   Anemia Panel: No results for input(s): VITAMINB12, FOLATE, FERRITIN, TIBC, IRON, RETICCTPCT in the last 72 hours. Urine analysis:    Component Value Date/Time   COLORURINE YELLOW 12/08/2018 [place] 12/08/2018 1208   LABSPEC 1.015 12/08/2018 1208   PHURINE 5.0 12/08/2018 1208   GLUCOSEU NEGATIVE 12/08/2018 1208   HGBUR NEGATIVE 12/08/2018 [place] 12/08/2018 1208   KETONESUR NEGATIVE 12/08/2018 1208   PROTEINUR NEGATIVE 12/08/2018 1208   NITRITE NEGATIVE 12/08/2018 1208   LEUKOCYTESUR NEGATIVE 12/08/2018 1208   Sepsis Labs: @LABRCNTIP (procalcitonin:4,lacticidven:4) )No results found for this or any previous visit (from the past 240 hour(s)).   Radiological Exams on Admission: Mr Cervical Spine Wo Contrast  Result Date: 12/08/2018 CLINICAL DATA:  Low back pain, cervical pain, weakness in both legs EXAM: MRI CERVICAL SPINE WITHOUT CONTRAST TECHNIQUE: Multiplanar, multisequence MR imaging of the cervical spine was performed. No intravenous contrast was administered. COMPARISON:  None. FINDINGS: Alignment: 2 mm anterolisthesis of C7 on T1. Vertebrae: No fracture, evidence of discitis, or bone lesion. Cord: Normal signal and morphology. Posterior Fossa, vertebral arteries, paraspinal tissues: Posterior fossa demonstrates no focal abnormality. Vertebral artery flow voids are maintained. Paraspinal soft tissues are unremarkable. Disc levels: Discs: Degenerative disease with disc height loss at C4-5, C5-6 and C6-7. C2-3: No significant disc bulge. No neural foraminal stenosis. No central canal stenosis. C3-4: Broad-based disc bulge. Left uncovertebral degenerative changes. Severe left foraminal stenosis. No right foraminal stenosis. No central canal stenosis. C4-5: Mild broad-based disc bulge. Bilateral uncovertebral degenerative changes. Severe bilateral foraminal stenosis. Mild spinal stenosis. C5-6: Broad-based disc osteophyte complex. Bilateral uncovertebral degenerative changes. Mild bilateral foraminal stenosis. No central canal stenosis. C6-7: Mild broad-based disc bulge. No neural foraminal stenosis. No central canal stenosis. C7-T1: No significant disc  bulge. Moderate right foraminal stenosis. No left foraminal stenosis. No central canal stenosis. T1-2: Mild broad-based disc bulge.  Mild left foraminal stenosis. T2-3: Broad-based disc bulge with a small left paracentral disc protrusion. No foraminal stenosis. IMPRESSION: 1. Diffuse cervical spine spondylosis as described above. 2.  No acute osseous injury of the cervical spine. Electronically Signed   By: Kathreen Devoid   On: 12/08/2018 14:43   Mr Thoracic Spine Wo Contrast  Result Date: 12/08/2018 CLINICAL DATA:  Low back pain. Pain, numbness, and weakness in both legs. Symptoms for 1 day. EXAM: MRI THORACIC SPINE WITHOUT CONTRAST TECHNIQUE: Multiplanar, multisequence MR imaging of the thoracic spine was performed. No intravenous contrast was administered. COMPARISON:  None. FINDINGS: Alignment:  Trace anterolisthesis of C7 on T1. Vertebrae: No fracture or suspicious marrow lesion. Mild degenerative endplate changes most notable at T7-8 and  T8-9. Cord:  Normal signal. Paraspinal and other soft tissues: 8 mm well-circumscribed T2 hyperintense focus in the right hepatic lobe, incompletely evaluated on this study though potentially reflecting a cyst or hemangioma. Disc levels: Mild disc bulging throughout the thoracic spine without cord compression or significant spinal stenosis. Mild-to-moderate facet arthrosis in the mid and lower thoracic spine without significant associated neural foraminal stenosis. Asymmetric left ligamentum flavum thickening at T10-11 without significant spinal stenosis. Moderate right neural foraminal stenosis at C7-T1 due to anterolisthesis, endplate spurring, and facet arthrosis. IMPRESSION: 1. Mild thoracic disc degeneration without spinal stenosis. 2. Moderate right neural foraminal stenosis at C7-T1. 3. Normal appearance of the thoracic spinal cord. Electronically Signed   By: Logan Bores M.D.   On: 12/08/2018 10:51   Mr Lumbar Spine Wo Contrast  Result Date: 12/08/2018 CLINICAL  DATA:  Low back pain. Pain, numbness, and weakness in both legs. Symptoms for 1 day. EXAM: MRI LUMBAR SPINE WITHOUT CONTRAST TECHNIQUE: Multiplanar, multisequence MR imaging of the lumbar spine was performed. No intravenous contrast was administered. COMPARISON:  None. FINDINGS: Segmentation:  Standard. Alignment: Mild-to-moderate lumbar levoscoliosis. Trace retrolisthesis of L1 on L2 and L2 on L3. Vertebrae: No fracture, suspicious osseous lesion, or significant marrow edema. Conus medullaris and cauda equina: Conus extends to the lower L1 level. Conus and cauda equina appear normal. Paraspinal and other soft tissues: Stones in the gallbladder measuring up to 1.2 cm. Disc levels: Disc desiccation throughout the lumbar spine. Mild disc space narrowing from L2-3 to L4-5. L1-2: Mild disc bulging eccentric to the left without stenosis. L2-3: Circumferential disc bulging and mild facet hypertrophy result in minimal bilateral neural foraminal narrowing inferiorly without spinal stenosis. L3-4: Mild disc bulging eccentric to the left and slight facet hypertrophy result in minimal left neural foraminal narrowing without spinal stenosis. L4-5: Disc bulging eccentric to the right and slight facet hypertrophy result in mild right neural foraminal stenosis. There is a small right central disc extrusion with mild superior migration without significant spinal or lateral recess stenosis. Patent left neural foramen. L5-S1: Mild leftward disc bulging and mild facet hypertrophy result in mild left neural foraminal stenosis without spinal stenosis. IMPRESSION: Mild lumbar disc and facet degeneration with mild neural foraminal stenosis at L4-5 and L5-S1. No spinal stenosis. Electronically Signed   By: Logan Bores M.D.   On: 12/08/2018 10:41    EKG: Independently reviewed.  Sinus bradycardia with right bundle branch block and left anterior fascicular block.  No acute ST changes  Assessment/Plan: Principal Problem:   Leg  weakness, bilateral Active Problems:   GERD (gastroesophageal reflux disease)   IBS (irritable bowel syndrome)    This patient was discussed with the ED physician, including pertinent vitals, physical exam findings, labs, and imaging.  We also discussed care given by the ED provider.  1. Leg weakness bilateral a. Neurology consulted. b. 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 Admit to ICU d. Telemetry monitoring e. Forced vital capacity every 12 hours f. LP done g. Repeat CBC, CMP in the morning h. We will start IVIG 2. IBS a. Continue Linzess 3. GERD a. Continue PPI  DVT prophylaxis: SCDs given patient had LP.  Will hold chemoprophylaxis for minimum 12 hours after neuro axial intervention Consultants: Neurology Code Status: Full code Family Communication: None Disposition Plan: Pending   Truett Mainland, DO

## 2018-12-08 NOTE — Consult Note (Addendum)
[place] A. Merlene Laughter, MD     [email or web address]          Paula Sutton is an 63 y.o. female.   ASSESSMENT/PLAN: 1.  ACUTE PARAPLEGIA: The abrupt nature is very suggestive of acute myelopathy but this has not been borne out on neuro imaging.  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  Consequently, the patient will be started on the typical regimen for Guillain-Barr syndrome with 400 mg/kg/day for 5 days of immunoglobulins.  Given the patient's rapid deterioration, she is at high risk for upper extremity and respiratory muscle weakness resulting in respiratory failure.  She should be monitored closely.  ICU monitoring is suggested.  We will do every 12 hours forced vital capacity and  NIF.  The forced vital capacity is below 10 mL/kg elective intubation should be considered especially if there is some mild dyspnea.  We should also watch her for the dysrhythmias which is a complication of Guillain-Barr syndrome.  The patient will need extensive in the long-term physical therapy.  I did discuss this with the patient and her husband.  Improvement usually takes 4 to 6 months.  She should be considered for inpatient rehab.  DVT prophylaxis is needed.    Patient is 63 year old white female who developed severe spasms involving the right lower extremity on yesterday.  She apparently had this a couple times but did go on to have right carpal tunnel release surgery the same day.  She was placed on the general anesthesia for the procedure.  She did well with this but this morning noticed that she was having severe spasms of both lower extremity and will could not stand up or move her legs.  She denies any symptoms involving the upper extremities.  She does report having significant numbness and tingling of the feet and distal legs.  Interestingly, she did  have one episode of periorbital tingling but this was fleeting.  She does not report having dysarthria, dysphasia, headaches or visual symptoms.  There is no reports of shortness of breath or chest pain.  She reports that she did have some dysesthesias involving the right groin region and back region bilaterally earlier this week.  The dysesthesias was associated with what appears to be allodynia.  The patient does not report having any diarrhea or upper respiratory symptoms.  There are no reports of infections recently.  The review of systems otherwise negative.   GENERAL: This is a pleasant female who is in no acute distress.  HEENT: This is normal  ABDOMEN: soft  EXTREMITIES: No edema; the right forearm and hand are in a postoperative wrap.  BACK: Normal  SKIN: Normal by inspection.    MENTAL STATUS: Alert and oriented. Speech, language and cognition are generally intact. Judgment and insight normal.   CRANIAL NERVES: Pupils are equal, round and reactive to light and accomodation; extra ocular movements are full, there is no significant nystagmus; visual fields are full; upper and lower facial muscles are normal in strength and symmetric, there is no flattening of the nasolabial folds; tongue is midline; uvula is midline; shoulder elevation is normal.  MOTOR: Normal tone, bulk and strength in the upper extremities; no pronator drift.  Right lower extremity 2/5.  Left lower extremity 0/5.  The tone in the lower extremities is reduced.  COORDINATION: Left finger to nose is normal, right finger to nose is normal,  No rest tremor; no intention tremor; no postural tremor; no bradykinesia.  REFLEXES: Deep tendon reflexes are symmetrical and normal but absent at the knees and patella. Plantar reflexes are flexor bilaterally.   SENSATION: Markedly to diminish pain, pinprick and light touch involving the lower extremities all the way to the buttocks region and involving S1-S3.       Blood  pressure (!) 165/74, pulse (!) 40, temperature 97.6 F (36.4 C), temperature source Oral, resp. rate 16, height 5\' 3"  (1.6 m), weight 72.6 kg, SpO2 99 %.  Past Medical History:  Diagnosis Date  . Arthritis    knee  . Dyslipidemia   . GERD (gastroesophageal reflux disease)   . Left anterior fascicular block    PATIENT SAYS THIS SHOWED ON EKG DURING COLONOSCOPY , WAS REFERRED TO CARDIOLOGY DR. Minus Breeding  (SEE EPIC ENCOUNTER 09-2017)       Past Surgical History:  Procedure Laterality Date  . ABDOMINAL HYSTERECTOMY    . BIOPSY  08/09/2017   Procedure: BIOPSY;  Surgeon: Danie Binder, MD;  Location: [facility];  Service: Endoscopy;;  gastric biopsy for h pylori  . CARPAL TUNNEL RELEASE Right 12/07/2018  . CATARACT EXTRACTION, BILATERAL     DID 1 YEAR APART , LAST ONE WAS 12-2017  . COLONOSCOPY WITH ESOPHAGOGASTRODUODENOSCOPY (EGD)  2014   [place] PROPOFOL N/A 08/09/2017   3 simple adenomas, diverticulosis in recto-sigmoid, sigmoid, and descending colin, moderate external and internal hemorrhoids  . ESOPHAGOGASTRODUODENOSCOPY (EGD) WITH PROPOFOL N/A 08/09/2017   Low-grade narrowing Schatzki ring due to GERD; small hiatal hernia; mild gastritis and duodenitis due to NSAIDs; biopsy with gastritis  . KNEE ARTHROSCOPY Right 11/2017   WITH DR Wynelle Link  AT [place] [facility]   . POLYPECTOMY  08/09/2017   Procedure: POLYPECTOMY;  Surgeon: Danie Binder, MD;  Location: [facility];  Service: Endoscopy;;  cecal polyp hs, transverse colon polyp hs, rectal polyp cs  . TOTAL KNEE REVISION Right 03/01/2018   Procedure: RIGHT TOTAL KNEE REVISION;  Surgeon: Gaynelle Arabian, MD;  Location: [facility];  Service: Orthopedics;  Laterality: Right;  139min    Family History  Problem Relation Age of Onset  . Alcohol abuse Mother   . Colon cancer Neg Hx   . Breast cancer Neg Hx     Social History:  reports that she quit smoking about 47 years  ago. Her smoking use included cigarettes. She has never used smokeless tobacco. She reports current alcohol use. She reports current drug use. Frequency: 7.00 times per week. Drug: Marijuana.  Allergies:  Allergies  Allergen Reactions  . Sulfa Antibiotics Anaphylaxis and Other (See Comments)    Tongue swelling     Medications: Prior to Admission medications   Medication Sig Start Date End Date Taking? Authorizing Provider  CALCIUM PO Take 1 tablet by mouth 2 (two) times daily.    Yes [provider]  Cholecalciferol (VITAMIN D3) 125 MCG (5000 UT) CAPS Take 5,000 Units by mouth 2 (two) times daily.   Yes [provider]  estradiol (CLIMARA - DOSED IN MG/24 HR) 0.1 mg/24hr patch Place 0.1 mg onto the skin once a week.  01/08/16  Yes [provider]  lansoprazole (PREVACID) 30 MG capsule 1 po 30 mins prior to first meal AND LAST MEAL 09/27/18  Yes Carlis Stable, NP  linaclotide (LINZESS) 145 MCG CAPS capsule Take 1 capsule (145 mcg total) by mouth  daily before breakfast. Patient taking differently: Take 145 mcg by mouth as needed.  02/13/18  Yes Annitta Needs, NP  OVER THE COUNTER MEDICATION Take 1-2 capsules by mouth See admin instructions. Omega 3/Red Yeast Rice Take 1 capsule by mouth in the morning and take 2 capsules by mouth at bedtime   Yes [provider]  polyethylene glycol (MIRALAX / GLYCOLAX) 17 g packet Take 17 g by mouth daily as needed.   Yes [provider]  Vitamin D-Vitamin K (K2 PLUS D3 PO) Take by mouth daily.   Yes [provider]    Scheduled Meds: Continuous Infusions: PRN Meds:.     Results for orders placed or performed during the hospital encounter of 12/08/18 (from the past 48 hour(s))  Basic metabolic panel     Status: Abnormal   Collection Time: 12/08/18  8:55 AM  Result Value Ref Range   Sodium 140 135 - 145 mmol/L   Potassium 3.6 3.5 - 5.1 mmol/L   Chloride 107 98 - 111 mmol/L   CO2 22 22 - 32 mmol/L    Glucose, Bld 135 (H) 70 - 99 mg/dL   BUN 15 8 - 23 mg/dL   Creatinine, Ser 0.90 0.44 - 1.00 mg/dL   Calcium 9.6 8.9 - 10.3 mg/dL   GFR calc non Af Amer >60 >60 mL/min   GFR calc Af Amer >60 >60 mL/min   Anion gap 11 5 - 15    Comment: Performed at [facility], [address], [place], [place] [address]  CBC with Differential     Status: Abnormal   Collection Time: 12/08/18  8:55 AM  Result Value Ref Range   WBC 14.8 (H) 4.0 - 10.5 K/uL   RBC 5.06 3.87 - 5.11 MIL/uL   Hemoglobin 15.0 12.0 - 15.0 g/dL   HCT 47.3 (H) 36.0 - 46.0 %   MCV 93.5 80.0 - 100.0 fL   MCH 29.6 26.0 - 34.0 pg   MCHC 31.7 30.0 - 36.0 g/dL   RDW 14.7 11.5 - 15.5 %   Platelets 269 150 - 400 K/uL   nRBC 0.0 0.0 - 0.2 %   Neutrophils Relative % 78 %   Neutro Abs 11.6 (H) 1.7 - 7.7 K/uL   Lymphocytes Relative 16 %   Lymphs Abs 2.3 0.7 - 4.0 K/uL   Monocytes Relative 5 %   Monocytes Absolute 0.7 0.1 - 1.0 K/uL   Eosinophils Relative 0 %   Eosinophils Absolute 0.0 0.0 - 0.5 K/uL   Basophils Relative 0 %   Basophils Absolute 0.0 0.0 - 0.1 K/uL   Immature Granulocytes 1 %   Abs Immature Granulocytes 0.09 (H) 0.00 - 0.07 K/uL    Comment: Performed at [facility], [address], [place], [place] [address]  Magnesium     Status: Abnormal   Collection Time: 12/08/18  8:55 AM  Result Value Ref Range   Magnesium 2.5 (H) 1.7 - 2.4 mg/dL    Comment: Performed at [facility], [address], [place], [place] [address]  TSH     Status: None   Collection Time: 12/08/18  8:55 AM  Result Value Ref Range   TSH 2.052 0.350 - 4.500 uIU/mL    Comment: Performed by a 3rd Generation assay with a functional sensitivity of <=0.01 uIU/mL. Performed at [facility], [address] [place], [place] [address]   Urinalysis, Routine w reflex microscopic     Status: None   Collection Time: 12/08/18 12:08 PM  Result Value Ref Range   Color, Urine YELLOW YELLOW   APPearance CLEAR CLEAR   Specific Gravity, Urine 1.015 1.005 - 1.030    pH 5.0 5.0 - 8.0   Glucose, UA NEGATIVE NEGATIVE mg/dL   Hgb urine dipstick NEGATIVE NEGATIVE   Bilirubin Urine NEGATIVE NEGATIVE   Ketones, ur NEGATIVE NEGATIVE mg/dL   Protein, ur NEGATIVE NEGATIVE mg/dL   Nitrite NEGATIVE NEGATIVE   Leukocytes,Ua NEGATIVE NEGATIVE    Comment: Performed at [facility], [address], [place], [place] [address]    Studies/Results:  C SPINE MRI FINDINGS: Alignment: 2 mm anterolisthesis of C7 on T1.  Vertebrae: No fracture, evidence of discitis, or bone lesion.  Cord: Normal signal and morphology.  Posterior Fossa, vertebral arteries, paraspinal tissues: Posterior fossa demonstrates no focal abnormality. Vertebral artery flow voids are maintained. Paraspinal soft tissues are unremarkable.  Disc levels:  Discs: Degenerative disease with disc height loss at C4-5, C5-6 and C6-7.  C2-3: No significant disc bulge. No neural foraminal stenosis. No central canal stenosis.  C3-4: Broad-based disc bulge. Left uncovertebral degenerative changes. Severe left foraminal stenosis. No right foraminal stenosis. No central canal stenosis.  C4-5: Mild broad-based disc bulge. Bilateral uncovertebral degenerative changes. Severe bilateral foraminal stenosis. Mild spinal stenosis.  C5-6: Broad-based disc osteophyte complex. Bilateral uncovertebral degenerative changes. Mild bilateral foraminal stenosis. No central canal stenosis.  C6-7: Mild broad-based disc bulge. No neural foraminal stenosis. No central canal stenosis.  C7-T1: No significant disc bulge. Moderate right foraminal stenosis. No left foraminal stenosis. No central canal stenosis.  T1-2: Mild broad-based disc bulge.  Mild left foraminal stenosis.  T2-3: Broad-based disc bulge with a small left paracentral disc protrusion. No foraminal stenosis.  IMPRESSION: 1. Diffuse cervical spine spondylosis as described above. 2.  No acute osseous injury of the cervical spine.        L AND T SPINE MRI  Mild disc bulging throughout the thoracic spine without cord compression or significant spinal stenosis. Mild-to-moderate facet arthrosis in the mid and lower thoracic spine without significant associated neural foraminal stenosis. Asymmetric left ligamentum flavum thickening at T10-11 without significant spinal stenosis. Moderate right neural foraminal stenosis at C7-T1 due to anterolisthesis, endplate spurring, and facet arthrosis.  IMPRESSION: 1. Mild thoracic disc degeneration without spinal stenosis. 2. Moderate right neural foraminal stenosis at C7-T1. 3. Normal appearance of the thoracic spinal cord.    FINDINGS: Segmentation:  Standard.  Alignment: Mild-to-moderate lumbar levoscoliosis. Trace retrolisthesis of L1 on L2 and L2 on L3.  Vertebrae: No fracture, suspicious osseous lesion, or significant marrow edema.  Conus medullaris and cauda equina: Conus extends to the lower L1 level. Conus and cauda equina appear normal.  Paraspinal and other soft tissues: Stones in the gallbladder measuring up to 1.2 cm.  Disc levels:  Disc desiccation throughout the lumbar spine. Mild disc space narrowing from L2-3 to L4-5.  L1-2: Mild disc bulging eccentric to the left without stenosis.  L2-3: Circumferential disc bulging and mild facet hypertrophy result in minimal bilateral neural foraminal narrowing inferiorly without spinal stenosis.  L3-4: Mild disc bulging eccentric to the left and slight facet hypertrophy result in minimal left neural foraminal narrowing without spinal stenosis.  L4-5: Disc bulging eccentric to the right and slight facet hypertrophy result in mild right neural foraminal stenosis. There is a small right central disc extrusion with mild superior migration without significant spinal or lateral recess stenosis. Patent left neural foramen.  L5-S1: Mild leftward disc bulging and mild facet hypertrophy result in  mild left neural foraminal stenosis without spinal stenosis.  IMPRESSION: Mild lumbar disc and facet degeneration with mild neural foraminal stenosis at L4-5 and L5-S1. No spinal stenosis.     Tinesha Siegrist A. Merlene Laughter, M.D.  Diplomate, Tax adviser of Psychiatry and Neurology ( Neurology). 12/08/2018, 4:54 PM

## 2018-12-09 LAB — CBC
HCT: 43.4 % (ref 36.0–46.0)
Hemoglobin: 13.6 g/dL (ref 12.0–15.0)
MCH: 29.6 pg (ref 26.0–34.0)
MCHC: 31.3 g/dL (ref 30.0–36.0)
MCV: 94.3 fL (ref 80.0–100.0)
Platelets: 243 10*3/uL (ref 150–400)
RBC: 4.6 MIL/uL (ref 3.87–5.11)
RDW: 15.1 % (ref 11.5–15.5)
WBC: 9.8 10*3/uL (ref 4.0–10.5)
nRBC: 0 % (ref 0.0–0.2)

## 2018-12-09 LAB — BASIC METABOLIC PANEL
Anion gap: 7 (ref 5–15)
BUN: 16 mg/dL (ref 8–23)
CO2: 24 mmol/L (ref 22–32)
Calcium: 9.3 mg/dL (ref 8.9–10.3)
Chloride: 109 mmol/L (ref 98–111)
Creatinine, Ser: 0.81 mg/dL (ref 0.44–1.00)
GFR calc Af Amer: >60 mL/min (ref >60)
GFR calc non Af Amer: >60 mL/min (ref >60)
Glucose, Bld: 106 mg/dL — ABNORMAL HIGH (ref 70–99)
Potassium: 4.3 mmol/L (ref 3.5–5.1)
Sodium: 140 mmol/L (ref 135–145)

## 2018-12-09 MED ORDER — LORATADINE 10 MG PO TABS
10.0000 mg | ORAL_TABLET | Freq: Every day | ORAL | Status: DC
  Administered 2018-12-09 – 2018-12-15 (×7): 10 mg via ORAL
  Filled 2018-12-09 (×7): qty 1

## 2018-12-09 MED ORDER — KETOROLAC TROMETHAMINE 15 MG/ML IJ SOLN
15.0000 mg | Freq: Four times a day (QID) | INTRAMUSCULAR | Status: AC | PRN
  Administered 2018-12-09 – 2018-12-11 (×5): 15 mg via INTRAVENOUS
  Filled 2018-12-09 (×5): qty 1

## 2018-12-09 MED ORDER — CHLORHEXIDINE GLUCONATE CLOTH 2 % EX PADS
6.0000 | MEDICATED_PAD | Freq: Every day | CUTANEOUS | Status: DC
  Administered 2018-12-09 – 2018-12-12 (×4): 6 via TOPICAL

## 2018-12-09 MED ORDER — LINACLOTIDE 72 MCG PO CAPS
72.0000 ug | ORAL_CAPSULE | Freq: Every day | ORAL | Status: DC | PRN
  Administered 2018-12-10: 72 ug via ORAL
  Filled 2018-12-09 (×3): qty 1

## 2018-12-09 MED ORDER — BISACODYL 10 MG RE SUPP
10.0000 mg | Freq: Once | RECTAL | Status: AC
  Administered 2018-12-09: 10 mg via RECTAL
  Filled 2018-12-09: qty 1

## 2018-12-09 NOTE — ED Notes (Signed)
Pt provided Tabletop Fan at bedside.

## 2018-12-09 NOTE — Progress Notes (Signed)
Vital capicity NIF: -80 cm H2O  Forced Vital 1.5liters

## 2018-12-09 NOTE — Progress Notes (Signed)
Vital capacity  Nif = negative  80cm of H2O Forced vital   1.75liters

## 2018-12-09 NOTE — Progress Notes (Signed)
PROGRESS NOTE    Paula Sutton  [id number] DOB: 30-Nov-1955 DOA: 12/08/2018 PCP: Celene Squibb, MD   Brief Narrative:  Per HPI: Paula Sutton is a 63 y.o. female with a history of GERD, dyslipidemia, arthritis, recent carpal tunnel release on the right upper extremity.  Patient seen for sudden onset of lower extremity weakness with paresthesias and cramping that started this morning around 6 AM.  She reports no new medications other than hydrocodone that she started yesterday after her carpal tunnel release.  Left lower extremity weakness is greater than right.  No palliating or provoking factors.  She has not had a flu or any other vaccine recently.  She denies diarrhea, nausea, vomiting, headache, chest pain, shortness of breath.  No rashes.  No loss of bladder or bowel function and reports no new injuries, trauma, or fall.  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  Neurology has assessed patient and she will require 5 days worth of IVIG as well as PT evaluation.  She is maintaining her forced vital capacity on repeat respiratory therapy evaluations.  She is complaining of constipation this morning.  Assessment & Plan:   Principal Problem:   Leg weakness, bilateral Active Problems:   GERD (gastroesophageal reflux disease)   IBS (irritable bowel syndrome)   Acute bilateral lower extremity weakness secondary to Guillan-Barr syndrome -Appreciate neurology recommendations with IVIG for 5 doses -Continue to monitor forced vital capacity -Repeat a.m. labs -PT evaluation with likely need for SNF/rehab  IBS with constipation -Continue Linzess -Suppository this a.m.  GERD -Continue PPI  Recent carpal tunnel surgery  DVT prophylaxis: SCDs Code Status: Full Family Communication: None at bedside Disposition Plan: Continue treatment with IVIG daily for 5 days.  PT  evaluation with likely need for SNF/rehab until further improvement.   Consultants:   Neurology  Procedures:   Lumbar puncture performed 9/25  Antimicrobials:  Anti-infectives (From admission, onward)   None       Subjective: Patient seen and evaluated today with no new acute complaints or concerns. No acute concerns or events noted overnight.  She states that she is feeling okay and having some sensation return to her legs.  She would like to try to have a bowel movement.  Objective: Vitals:   12/09/18 0900 12/09/18 1015 12/09/18 1037 12/09/18 1130  BP: 134/61     Pulse: (!) 57 77 66 64  Resp: 16  (!) 29 (!) 27  Temp:   98.6 F (37 C) 97.9 F (36.6 C)  TempSrc:   Oral Oral  SpO2: 98% 98% 98% 100%  Weight:   73.9 kg   Height:   5\' 3"  (1.6 m)     Intake/Output Summary (Last 24 hours) at 12/09/2018 1145 Last data filed at 12/09/2018 0048 Gross per 24 hour  Intake 350 ml  Output -  Net 350 ml   Filed Weights   12/08/18 0822 12/09/18 1037  Weight: 72.6 kg 73.9 kg    Examination:  General exam: Appears calm and comfortable  Respiratory system: Clear to auscultation. Respiratory effort normal. Cardiovascular system: S1 & S2 heard, RRR. No JVD, murmurs, rubs, gallops or clicks. No pedal edema. Gastrointestinal system: Abdomen is nondistended, soft and nontender. No organomegaly or masses felt. Normal bowel sounds heard. Central nervous system: Alert and oriented. No focal neurological deficits. Extremities: Symmetric 5 x 5 power. Skin: No rashes, lesions or  ulcers Psychiatry: Judgement and insight appear normal. Mood & affect appropriate.     Data Reviewed: I have personally reviewed following labs and imaging studies  CBC: Recent Labs  Lab 12/08/18 0855 12/09/18 0616  WBC 14.8* 9.8  NEUTROABS 11.6*  --   HGB 15.0 13.6  HCT 47.3* 43.4  MCV 93.5 94.3  PLT 269 0000000   Basic Metabolic Panel: Recent Labs  Lab 12/08/18 0855 12/09/18 0616  NA 140 140  K  3.6 4.3  CL 107 109  CO2 22 24  GLUCOSE 135* 106*  BUN 15 16  CREATININE 0.90 0.81  CALCIUM 9.6 9.3  MG 2.5*  --    GFR: Estimated Creatinine Clearance: 66.7 mL/min (by C-G formula based on SCr of 0.81 mg/dL). Liver Function Tests: No results for input(s): AST, ALT, ALKPHOS, BILITOT, PROT, ALBUMIN in the last 168 hours. No results for input(s): LIPASE, AMYLASE in the last 168 hours. No results for input(s): AMMONIA in the last 168 hours. Coagulation Profile: No results for input(s): INR, PROTIME in the last 168 hours. Cardiac Enzymes: No results for input(s): CKTOTAL, CKMB, CKMBINDEX, TROPONINI in the last 168 hours. BNP (last 3 results) No results for input(s): PROBNP in the last 8760 hours. HbA1C: No results for input(s): HGBA1C in the last 72 hours. CBG: No results for input(s): GLUCAP in the last 168 hours. Lipid Profile: No results for input(s): CHOL, HDL, LDLCALC, TRIG, CHOLHDL, LDLDIRECT in the last 72 hours. Thyroid Function Tests: Recent Labs    12/08/18 0855  TSH 2.052   Anemia Panel: No results for input(s): VITAMINB12, FOLATE, FERRITIN, TIBC, IRON, RETICCTPCT in the last 72 hours. Sepsis Labs: No results for input(s): PROCALCITON, LATICACIDVEN in the last 168 hours.  Recent Results (from the past 240 hour(s))  CSF culture     Status: None (Preliminary result)   Collection Time: 12/08/18  5:08 PM   Specimen: CSF; Cerebrospinal Fluid  Result Value Ref Range Status   Specimen Description   Final    CSF Performed at [facility], [address] [place], [place] [address]    Special Requests   Final    NONE Performed at [facility], [address] [place], [place] [address]    Gram Stain   Final    CYTOSPIN SMEAR NO CELLS OR ORGANISMS SEEN Gram Stain Report Called to,Read Back By and Verified With: SAPPELT. J. AT 1908 12/08/2018 BY EVA Performed at [facility], [address] [place], [place] [address]    Culture   Final    NO GROWTH < 24 HOURS  Performed at [place] [address] [place], [place] [address]    Report Status PENDING  Incomplete  SARS Coronavirus 2 [facility] order, Performed in [facility] hospital lab) Nasopharyngeal Nasopharyngeal Swab     Status: None   Collection Time: 12/08/18  7:20 PM   Specimen: Nasopharyngeal Swab  Result Value Ref Range Status   SARS Coronavirus 2 NEGATIVE NEGATIVE Final    Comment: (NOTE) If result is NEGATIVE SARS-CoV-2 target nucleic acids are NOT DETECTED. The SARS-CoV-2 RNA is generally detectable in upper and lower  respiratory specimens during the acute phase of infection. The lowest  concentration of SARS-CoV-2 viral copies this assay can detect is 250  copies / mL. A negative result does not preclude SARS-CoV-2 infection  and should not be used as the sole basis for treatment or other  patient management decisions.  A negative result may occur with  improper specimen  collection / handling, submission of specimen other  than nasopharyngeal swab, presence of viral mutation(s) within the  areas targeted by this assay, and inadequate number of viral copies  (<250 copies / mL). A negative result must be combined with clinical  observations, patient history, and epidemiological information. If result is POSITIVE SARS-CoV-2 target nucleic acids are DETECTED. The SARS-CoV-2 RNA is generally detectable in upper and lower  respiratory specimens dur ing the acute phase of infection.  Positive  results are indicative of active infection with SARS-CoV-2.  Clinical  correlation with patient history and other diagnostic information is  necessary to determine patient infection status.  Positive results do  not rule out bacterial infection or co-infection with other viruses. If result is PRESUMPTIVE POSTIVE SARS-CoV-2 nucleic acids MAY BE PRESENT.   A presumptive positive result was obtained on the submitted specimen  and confirmed on repeat testing.  While 2019 novel  coronavirus  (SARS-CoV-2) nucleic acids may be present in the submitted sample  additional confirmatory testing may be necessary for epidemiological  and / or clinical management purposes  to differentiate between  SARS-CoV-2 and other Sarbecovirus currently known to infect humans.  If clinically indicated additional testing with an alternate test  methodology [phone]) is advised. The SARS-CoV-2 RNA is generally  detectable in upper and lower respiratory sp ecimens during the acute  phase of infection. The expected result is Negative. Fact Sheet for Patients:  StrictlyIdeas.no Fact Sheet for Healthcare Providers: BankingDealers.co.za This test is not yet approved or cleared by the [place] FDA and has been authorized for detection and/or diagnosis of SARS-CoV-2 by FDA under an Emergency Use Authorization (EUA).  This EUA will remain in effect (meaning this test can be used) for the duration of the COVID-19 declaration under Section 564(b)(1) of the Act, 21 U.S.C. section 360bbb-3(b)(1), unless the authorization is terminated or revoked sooner. Performed at [facility], [address] [place], [place] [address]          Radiology Studies: Mr Cervical Spine Wo Contrast  Result Date: 12/08/2018 CLINICAL DATA:  Low back pain, cervical pain, weakness in both legs EXAM: MRI CERVICAL SPINE WITHOUT CONTRAST TECHNIQUE: Multiplanar, multisequence MR imaging of the cervical spine was performed. No intravenous contrast was administered. COMPARISON:  None. FINDINGS: Alignment: 2 mm anterolisthesis of C7 on T1. Vertebrae: No fracture, evidence of discitis, or bone lesion. Cord: Normal signal and morphology. Posterior Fossa, vertebral arteries, paraspinal tissues: Posterior fossa demonstrates no focal abnormality. Vertebral artery flow voids are maintained. Paraspinal soft tissues are unremarkable. Disc levels: Discs: Degenerative disease with  disc height loss at C4-5, C5-6 and C6-7. C2-3: No significant disc bulge. No neural foraminal stenosis. No central canal stenosis. C3-4: Broad-based disc bulge. Left uncovertebral degenerative changes. Severe left foraminal stenosis. No right foraminal stenosis. No central canal stenosis. C4-5: Mild broad-based disc bulge. Bilateral uncovertebral degenerative changes. Severe bilateral foraminal stenosis. Mild spinal stenosis. C5-6: Broad-based disc osteophyte complex. Bilateral uncovertebral degenerative changes. Mild bilateral foraminal stenosis. No central canal stenosis. C6-7: Mild broad-based disc bulge. No neural foraminal stenosis. No central canal stenosis. C7-T1: No significant disc bulge. Moderate right foraminal stenosis. No left foraminal stenosis. No central canal stenosis. T1-2: Mild broad-based disc bulge.  Mild left foraminal stenosis. T2-3: Broad-based disc bulge with a small left paracentral disc protrusion. No foraminal stenosis. IMPRESSION: 1. Diffuse cervical spine spondylosis as described above. 2.  No acute osseous injury of the cervical spine. Electronically Signed   By: Kathreen Devoid  On: 12/08/2018 14:43   Mr Thoracic Spine Wo Contrast  Result Date: 12/08/2018 CLINICAL DATA:  Low back pain. Pain, numbness, and weakness in both legs. Symptoms for 1 day. EXAM: MRI THORACIC SPINE WITHOUT CONTRAST TECHNIQUE: Multiplanar, multisequence MR imaging of the thoracic spine was performed. No intravenous contrast was administered. COMPARISON:  None. FINDINGS: Alignment:  Trace anterolisthesis of C7 on T1. Vertebrae: No fracture or suspicious marrow lesion. Mild degenerative endplate changes most notable at T7-8 and T8-9. Cord:  Normal signal. Paraspinal and other soft tissues: 8 mm well-circumscribed T2 hyperintense focus in the right hepatic lobe, incompletely evaluated on this study though potentially reflecting a cyst or hemangioma. Disc levels: Mild disc bulging throughout the thoracic spine  without cord compression or significant spinal stenosis. Mild-to-moderate facet arthrosis in the mid and lower thoracic spine without significant associated neural foraminal stenosis. Asymmetric left ligamentum flavum thickening at T10-11 without significant spinal stenosis. Moderate right neural foraminal stenosis at C7-T1 due to anterolisthesis, endplate spurring, and facet arthrosis. IMPRESSION: 1. Mild thoracic disc degeneration without spinal stenosis. 2. Moderate right neural foraminal stenosis at C7-T1. 3. Normal appearance of the thoracic spinal cord. Electronically Signed   By: Logan Bores M.D.   On: 12/08/2018 10:51   Mr Lumbar Spine Wo Contrast  Result Date: 12/08/2018 CLINICAL DATA:  Low back pain. Pain, numbness, and weakness in both legs. Symptoms for 1 day. EXAM: MRI LUMBAR SPINE WITHOUT CONTRAST TECHNIQUE: Multiplanar, multisequence MR imaging of the lumbar spine was performed. No intravenous contrast was administered. COMPARISON:  None. FINDINGS: Segmentation:  Standard. Alignment: Mild-to-moderate lumbar levoscoliosis. Trace retrolisthesis of L1 on L2 and L2 on L3. Vertebrae: No fracture, suspicious osseous lesion, or significant marrow edema. Conus medullaris and cauda equina: Conus extends to the lower L1 level. Conus and cauda equina appear normal. Paraspinal and other soft tissues: Stones in the gallbladder measuring up to 1.2 cm. Disc levels: Disc desiccation throughout the lumbar spine. Mild disc space narrowing from L2-3 to L4-5. L1-2: Mild disc bulging eccentric to the left without stenosis. L2-3: Circumferential disc bulging and mild facet hypertrophy result in minimal bilateral neural foraminal narrowing inferiorly without spinal stenosis. L3-4: Mild disc bulging eccentric to the left and slight facet hypertrophy result in minimal left neural foraminal narrowing without spinal stenosis. L4-5: Disc bulging eccentric to the right and slight facet hypertrophy result in mild right neural  foraminal stenosis. There is a small right central disc extrusion with mild superior migration without significant spinal or lateral recess stenosis. Patent left neural foramen. L5-S1: Mild leftward disc bulging and mild facet hypertrophy result in mild left neural foraminal stenosis without spinal stenosis. IMPRESSION: Mild lumbar disc and facet degeneration with mild neural foraminal stenosis at L4-5 and L5-S1. No spinal stenosis. Electronically Signed   By: Logan Bores M.D.   On: 12/08/2018 10:41   Dg Fluoro Guided Needle Plc Aspiration/injection Loc  Result Date: 12/08/2018 CLINICAL DATA:  BILATERAL lower extremity weakness EXAM: DIAGNOSTIC LUMBAR PUNCTURE UNDER FLUOROSCOPIC GUIDANCE FLUOROSCOPY TIME:  Fluoroscopy Time:  0 minutes 12 seconds Radiation Exposure Index (if provided by the fluoroscopic device): 4.4 mGy Number of Acquired Spot Images: Single screen capture PROCEDURE: Procedure, benefits, and risks were discussed with the patient, including alternatives. Patient's questions were answered. Written informed consent was obtained. Timeout protocol followed. Patient placed prone. L4-L5 disc space was localized under fluoroscopy. Skin prepped and draped in usual sterile fashion. Skin and soft tissues anesthetized with 2 mL of 1% lidocaine. 22 gauge needle was advanced  into the spinal canal where clear colorless CSF was encountered with an opening pressure of 15 cm H2O (LLD position). 12.5 mL of CSF was obtained in 4 tubes for requested analysis. Procedure tolerated very well by patient without immediate complication. IMPRESSION: Fluoroscopic guided lumbar puncture as above. Electronically Signed   By: Lavonia Dana M.D.   On: 12/08/2018 18:13        Scheduled Meds: . bisacodyl  10 mg Rectal Once  . Chlorhexidine Gluconate Cloth  6 each Topical Daily  . estradiol  0.1 mg Transdermal Weekly  . pantoprazole  40 mg Oral Daily   Continuous Infusions: . IMMUNE GLOBULIN 10% (HUMAN) IV - For Fluid  Restriction Only Stopped (12/09/18 0048)     LOS: 1 day    Time spent: 30 minutes    Quaniya Damas Darleen Crocker, DO [facility] Pager [phone]  If 7PM-7AM, please contact night-coverage [email or web address] Password [email or web address] 12/09/2018, 11:45 AM

## 2018-12-10 LAB — CBC
HCT: 41.4 % (ref 36.0–46.0)
Hemoglobin: 13.3 g/dL (ref 12.0–15.0)
MCH: 29.7 pg (ref 26.0–34.0)
MCHC: 32.1 g/dL (ref 30.0–36.0)
MCV: 92.4 fL (ref 80.0–100.0)
Platelets: 228 K/uL (ref 150–400)
RBC: 4.48 MIL/uL (ref 3.87–5.11)
RDW: 14.9 % (ref 11.5–15.5)
WBC: 6.2 K/uL (ref 4.0–10.5)
nRBC: 0 % (ref 0.0–0.2)

## 2018-12-10 LAB — BASIC METABOLIC PANEL
Anion gap: 6 (ref 5–15)
BUN: 14 mg/dL (ref 8–23)
CO2: 23 mmol/L (ref 22–32)
Calcium: 8.8 mg/dL — ABNORMAL LOW (ref 8.9–10.3)
Chloride: 111 mmol/L (ref 98–111)
Creatinine, Ser: 0.68 mg/dL (ref 0.44–1.00)
GFR calc Af Amer: >60 mL/min (ref >60)
GFR calc non Af Amer: >60 mL/min (ref >60)
Glucose, Bld: 99 mg/dL (ref 70–99)
Potassium: 3.6 mmol/L (ref 3.5–5.1)
Sodium: 140 mmol/L (ref 135–145)

## 2018-12-10 LAB — MRSA PCR SCREENING: MRSA by PCR: NEGATIVE

## 2018-12-10 LAB — HIV ANTIBODY (ROUTINE TESTING W REFLEX): HIV Screen 4th Generation wRfx: NONREACTIVE

## 2018-12-10 MED ORDER — FLEET ENEMA 7-19 GM/118ML RE ENEM
1.0000 | ENEMA | Freq: Every day | RECTAL | Status: DC | PRN
  Administered 2018-12-10: 15:00:00 1 via RECTAL
  Filled 2018-12-10: qty 1

## 2018-12-10 MED ORDER — FLUTICASONE PROPIONATE 50 MCG/ACT NA SUSP
1.0000 | Freq: Every day | NASAL | Status: DC
  Administered 2018-12-10 – 2018-12-15 (×6): 1 via NASAL
  Filled 2018-12-10 (×2): qty 16

## 2018-12-10 MED ORDER — LABETALOL HCL 5 MG/ML IV SOLN: 5.0000 mg | INTRAVENOUS | Status: DC | PRN

## 2018-12-10 NOTE — Progress Notes (Signed)
Dr. Manuella Ghazi notified of B/P 168/92.

## 2018-12-10 NOTE — Progress Notes (Signed)
Jeannette Corpus, NP notified of HR dipping into low 50s and back to mid to upper 50s.  VS otherwise are normal and patient is in no distress.

## 2018-12-10 NOTE — Progress Notes (Signed)
PROGRESS NOTE    Paula Sutton  [id number] DOB: 03/12/1956 DOA: 12/08/2018 PCP: Celene Squibb, MD   Brief Narrative:  Per HPI: Paula Sutton a 63 y.o.femalewith a history of GERD, dyslipidemia, arthritis, recent carpal tunnel release on the right upper extremity. Patient seen for sudden onset of lower extremity weakness with paresthesias and cramping that started this morning around 6 AM. She reports no new medications other than hydrocodone that she started yesterday after her carpal tunnel release. Left lower extremity weakness is greater than right. No palliating or provoking factors. She has not had a flu or any other vaccine recently. She denies diarrhea, nausea, vomiting, headache, chest pain, shortness of breath. No rashes. No loss of bladder or bowel function and reports no new injuries, trauma, or fall.  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  Neurology has assessed patient and she will require 5 days worth of IVIG as well as PT evaluation.  She is maintaining her forced vital capacity on repeat respiratory therapy evaluations.  She is complaining of constipation this morning.  9/27: Patient has completed 2 doses of IVIG thus far with 3 doses remaining.  We will plan to get up into chair today and use some Flonase to assist with her sinus headache.  Labetalol ordered as needed for blood pressure elevations.  Okay to transfer to [facility] today with respiratory therapy following FVC currently at 1.5 L.  Assessment & Plan:   Principal Problem:   Leg weakness, bilateral Active Problems:   GERD (gastroesophageal reflux disease)   IBS (irritable bowel syndrome)   Acute bilateral lower extremity weakness secondary to Guillan-Barr syndrome -Appreciate neurology recommendations with IVIG for 5 doses -Continue to monitor forced vital capacity which  appears to be adequate, okay for transfer to [facility] -Repeat a.m. labs -PT evaluation with likely need for SNF/rehab  IBS with constipation -Continue Linzess -Suppository this a.m. -Enema ordered as needed  GERD -Continue PPI  Recent carpal tunnel surgery  DVT prophylaxis: SCDs Code Status: Full Family Communication: None at bedside Disposition Plan: Continue treatment with IVIG daily for 5 days.  PT evaluation with likely need for SNF/rehab until further improvement.  Okay for transfer to [facility] today.   Consultants:   Neurology  Procedures:   Lumbar puncture performed 9/25  Antimicrobials:  Anti-infectives (From admission, onward)   None       Subjective: Patient seen and evaluated today with no new acute complaints or concerns. No acute concerns or events noted overnight.  She still has not had a bowel movement and has mild abdominal pain.  She is trying to have some sensation to her lower extremities return.  Objective: Vitals:   12/10/18 0619 12/10/18 0620 12/10/18 0649 12/10/18 0754  BP:   (!) 168/92   Pulse: (!) 58 (!) 57 (!) 51 63  Resp: 14 15 14 15   Temp:    98.2 F (36.8 C)  TempSrc:    Oral  SpO2: 100% 100% 100% 98%  Weight:      Height:        Intake/Output Summary (Last 24 hours) at 12/10/2018 0851 Last data filed at 12/10/2018 0100 Gross per 24 hour  Intake --  Output 1200 ml  Net -1200 ml   Filed Weights   12/08/18 0822 12/09/18 1037 12/10/18 0500  Weight: 72.6 kg 73.9 kg 76.2 kg    Examination:  General exam:  Appears calm and comfortable  Respiratory system: Clear to auscultation. Respiratory effort normal. Cardiovascular system: S1 & S2 heard, RRR. No JVD, murmurs, rubs, gallops or clicks. No pedal edema. Gastrointestinal system: Abdomen is nondistended, soft and nontender. No organomegaly or masses felt. Normal bowel sounds heard. Central nervous system: Alert and oriented. No focal neurological deficits. Extremities:  Symmetric 5 x 5 power. Skin: No rashes, lesions or ulcers Psychiatry: Judgement and insight appear normal. Mood & affect appropriate.     Data Reviewed: I have personally reviewed following labs and imaging studies  CBC: Recent Labs  Lab 12/08/18 0855 12/09/18 0616 12/10/18 0504  WBC 14.8* 9.8 6.2  NEUTROABS 11.6*  --   --   HGB 15.0 13.6 13.3  HCT 47.3* 43.4 41.4  MCV 93.5 94.3 92.4  PLT 269 243 [id number]   Basic Metabolic Panel: Recent Labs  Lab 12/08/18 0855 12/09/18 0616 12/10/18 0504  NA 140 140 140  K 3.6 4.3 3.6  CL 107 109 111  CO2 22 24 23   GLUCOSE 135* 106* 99  BUN 15 16 14   CREATININE 0.90 0.81 0.68  CALCIUM 9.6 9.3 8.8*  MG 2.5*  --   --    GFR: Estimated Creatinine Clearance: 68.5 mL/min (by C-G formula based on SCr of 0.68 mg/dL). Liver Function Tests: No results for input(s): AST, ALT, ALKPHOS, BILITOT, PROT, ALBUMIN in the last 168 hours. No results for input(s): LIPASE, AMYLASE in the last 168 hours. No results for input(s): AMMONIA in the last 168 hours. Coagulation Profile: No results for input(s): INR, PROTIME in the last 168 hours. Cardiac Enzymes: No results for input(s): CKTOTAL, CKMB, CKMBINDEX, TROPONINI in the last 168 hours. BNP (last 3 results) No results for input(s): PROBNP in the last 8760 hours. HbA1C: No results for input(s): HGBA1C in the last 72 hours. CBG: No results for input(s): GLUCAP in the last 168 hours. Lipid Profile: No results for input(s): CHOL, HDL, LDLCALC, TRIG, CHOLHDL, LDLDIRECT in the last 72 hours. Thyroid Function Tests: Recent Labs    12/08/18 0855  TSH 2.052   Anemia Panel: No results for input(s): VITAMINB12, FOLATE, FERRITIN, TIBC, IRON, RETICCTPCT in the last 72 hours. Sepsis Labs: No results for input(s): PROCALCITON, LATICACIDVEN in the last 168 hours.  Recent Results (from the past 240 hour(s))  CSF culture     Status: None (Preliminary result)   Collection Time: 12/08/18  5:08 PM   Specimen:  CSF; Cerebrospinal Fluid  Result Value Ref Range Status   Specimen Description   Final    CSF Performed at [facility], [address], [place], [place] [address]    Special Requests   Final    NONE Performed at [facility], [address], [place], [place] [address]    Gram Stain   Final    CYTOSPIN SMEAR NO CELLS OR ORGANISMS SEEN Gram Stain Report Called to,Read Back By and Verified With: SAPPELT. J. AT 1908 12/08/2018 BY EVA Performed at [facility], [address], [place], [place] [address]    Culture   Final    NO GROWTH < 24 HOURS Performed at [facility] [address], [place],  [address]    Report Status PENDING  Incomplete  SARS Coronavirus 2 [facility] order, Performed in [facility] hospital lab) Nasopharyngeal Nasopharyngeal Swab     Status: None   Collection Time: 12/08/18  7:20 PM   Specimen: Nasopharyngeal Swab  Result Value Ref Range Status   SARS Coronavirus 2 NEGATIVE  NEGATIVE Final    Comment: (NOTE) If result is NEGATIVE SARS-CoV-2 target nucleic acids are NOT DETECTED. The SARS-CoV-2 RNA is generally detectable in upper and lower  respiratory specimens during the acute phase of infection. The lowest  concentration of SARS-CoV-2 viral copies this assay can detect is 250  copies / mL. A negative result does not preclude SARS-CoV-2 infection  and should not be used as the sole basis for treatment or other  patient management decisions.  A negative result may occur with  improper specimen collection / handling, submission of specimen other  than nasopharyngeal swab, presence of viral mutation(s) within the  areas targeted by this assay, and inadequate number of viral copies  (<250 copies / mL). A negative result must be combined with clinical  observations, patient history, and epidemiological information. If result is POSITIVE SARS-CoV-2 target nucleic acids are DETECTED. The SARS-CoV-2 RNA is generally detectable in upper and lower    respiratory specimens dur ing the acute phase of infection.  Positive  results are indicative of active infection with SARS-CoV-2.  Clinical  correlation with patient history and other diagnostic information is  necessary to determine patient infection status.  Positive results do  not rule out bacterial infection or co-infection with other viruses. If result is PRESUMPTIVE POSTIVE SARS-CoV-2 nucleic acids MAY BE PRESENT.   A presumptive positive result was obtained on the submitted specimen  and confirmed on repeat testing.  While 2019 novel coronavirus  (SARS-CoV-2) nucleic acids may be present in the submitted sample  additional confirmatory testing may be necessary for epidemiological  and / or clinical management purposes  to differentiate between  SARS-CoV-2 and other Sarbecovirus currently known to infect humans.  If clinically indicated additional testing with an alternate test  methodology [phone]) is advised. The SARS-CoV-2 RNA is generally  detectable in upper and lower respiratory sp ecimens during the acute  phase of infection. The expected result is Negative. Fact Sheet for Patients:  StrictlyIdeas.no Fact Sheet for Healthcare Providers: BankingDealers.co.za This test is not yet approved or cleared by the [place] FDA and has been authorized for detection and/or diagnosis of SARS-CoV-2 by FDA under an Emergency Use Authorization (EUA).  This EUA will remain in effect (meaning this test can be used) for the duration of the COVID-19 declaration under Section 564(b)(1) of the Act, 21 U.S.C. section 360bbb-3(b)(1), unless the authorization is terminated or revoked sooner. Performed at [facility], [address] [place], [place] [address]   MRSA PCR Screening     Status: None   Collection Time: 12/09/18 10:39 AM   Specimen: Nasal Mucosa; Nasopharyngeal  Result Value Ref Range Status   MRSA by PCR NEGATIVE NEGATIVE  Final    Comment:        The GeneXpert MRSA Assay (FDA approved for NASAL specimens only), is one component of a comprehensive MRSA colonization surveillance program. It is not intended to diagnose MRSA infection nor to guide or monitor treatment for MRSA infections. Performed at [facility], [address] [place], [place] [address]          Radiology Studies: Mr Cervical Spine Wo Contrast  Result Date: 12/08/2018 CLINICAL DATA:  Low back pain, cervical pain, weakness in both legs EXAM: MRI CERVICAL SPINE WITHOUT CONTRAST TECHNIQUE: Multiplanar, multisequence MR imaging of the cervical spine was performed. No intravenous contrast was administered. COMPARISON:  None. FINDINGS: Alignment: 2 mm anterolisthesis of C7 on T1. Vertebrae: No fracture, evidence of discitis, or bone lesion. Cord: Normal  signal and morphology. Posterior Fossa, vertebral arteries, paraspinal tissues: Posterior fossa demonstrates no focal abnormality. Vertebral artery flow voids are maintained. Paraspinal soft tissues are unremarkable. Disc levels: Discs: Degenerative disease with disc height loss at C4-5, C5-6 and C6-7. C2-3: No significant disc bulge. No neural foraminal stenosis. No central canal stenosis. C3-4: Broad-based disc bulge. Left uncovertebral degenerative changes. Severe left foraminal stenosis. No right foraminal stenosis. No central canal stenosis. C4-5: Mild broad-based disc bulge. Bilateral uncovertebral degenerative changes. Severe bilateral foraminal stenosis. Mild spinal stenosis. C5-6: Broad-based disc osteophyte complex. Bilateral uncovertebral degenerative changes. Mild bilateral foraminal stenosis. No central canal stenosis. C6-7: Mild broad-based disc bulge. No neural foraminal stenosis. No central canal stenosis. C7-T1: No significant disc bulge. Moderate right foraminal stenosis. No left foraminal stenosis. No central canal stenosis. T1-2: Mild broad-based disc bulge.  Mild left foraminal  stenosis. T2-3: Broad-based disc bulge with a small left paracentral disc protrusion. No foraminal stenosis. IMPRESSION: 1. Diffuse cervical spine spondylosis as described above. 2.  No acute osseous injury of the cervical spine. Electronically Signed   By: Kathreen Devoid   On: 12/08/2018 14:43   Mr Thoracic Spine Wo Contrast  Result Date: 12/08/2018 CLINICAL DATA:  Low back pain. Pain, numbness, and weakness in both legs. Symptoms for 1 day. EXAM: MRI THORACIC SPINE WITHOUT CONTRAST TECHNIQUE: Multiplanar, multisequence MR imaging of the thoracic spine was performed. No intravenous contrast was administered. COMPARISON:  None. FINDINGS: Alignment:  Trace anterolisthesis of C7 on T1. Vertebrae: No fracture or suspicious marrow lesion. Mild degenerative endplate changes most notable at T7-8 and T8-9. Cord:  Normal signal. Paraspinal and other soft tissues: 8 mm well-circumscribed T2 hyperintense focus in the right hepatic lobe, incompletely evaluated on this study though potentially reflecting a cyst or hemangioma. Disc levels: Mild disc bulging throughout the thoracic spine without cord compression or significant spinal stenosis. Mild-to-moderate facet arthrosis in the mid and lower thoracic spine without significant associated neural foraminal stenosis. Asymmetric left ligamentum flavum thickening at T10-11 without significant spinal stenosis. Moderate right neural foraminal stenosis at C7-T1 due to anterolisthesis, endplate spurring, and facet arthrosis. IMPRESSION: 1. Mild thoracic disc degeneration without spinal stenosis. 2. Moderate right neural foraminal stenosis at C7-T1. 3. Normal appearance of the thoracic spinal cord. Electronically Signed   By: Logan Bores M.D.   On: 12/08/2018 10:51   Mr Lumbar Spine Wo Contrast  Result Date: 12/08/2018 CLINICAL DATA:  Low back pain. Pain, numbness, and weakness in both legs. Symptoms for 1 day. EXAM: MRI LUMBAR SPINE WITHOUT CONTRAST TECHNIQUE: Multiplanar,  multisequence MR imaging of the lumbar spine was performed. No intravenous contrast was administered. COMPARISON:  None. FINDINGS: Segmentation:  Standard. Alignment: Mild-to-moderate lumbar levoscoliosis. Trace retrolisthesis of L1 on L2 and L2 on L3. Vertebrae: No fracture, suspicious osseous lesion, or significant marrow edema. Conus medullaris and cauda equina: Conus extends to the lower L1 level. Conus and cauda equina appear normal. Paraspinal and other soft tissues: Stones in the gallbladder measuring up to 1.2 cm. Disc levels: Disc desiccation throughout the lumbar spine. Mild disc space narrowing from L2-3 to L4-5. L1-2: Mild disc bulging eccentric to the left without stenosis. L2-3: Circumferential disc bulging and mild facet hypertrophy result in minimal bilateral neural foraminal narrowing inferiorly without spinal stenosis. L3-4: Mild disc bulging eccentric to the left and slight facet hypertrophy result in minimal left neural foraminal narrowing without spinal stenosis. L4-5: Disc bulging eccentric to the right and slight facet hypertrophy result in mild right neural foraminal stenosis.  There is a small right central disc extrusion with mild superior migration without significant spinal or lateral recess stenosis. Patent left neural foramen. L5-S1: Mild leftward disc bulging and mild facet hypertrophy result in mild left neural foraminal stenosis without spinal stenosis. IMPRESSION: Mild lumbar disc and facet degeneration with mild neural foraminal stenosis at L4-5 and L5-S1. No spinal stenosis. Electronically Signed   By: Logan Bores M.D.   On: 12/08/2018 10:41   Dg Fluoro Guided Needle Plc Aspiration/injection Loc  Result Date: 12/08/2018 CLINICAL DATA:  BILATERAL lower extremity weakness EXAM: DIAGNOSTIC LUMBAR PUNCTURE UNDER FLUOROSCOPIC GUIDANCE FLUOROSCOPY TIME:  Fluoroscopy Time:  0 minutes 12 seconds Radiation Exposure Index (if provided by the fluoroscopic device): 4.4 mGy Number of  Acquired Spot Images: Single screen capture PROCEDURE: Procedure, benefits, and risks were discussed with the patient, including alternatives. Patient's questions were answered. Written informed consent was obtained. Timeout protocol followed. Patient placed prone. L4-L5 disc space was localized under fluoroscopy. Skin prepped and draped in usual sterile fashion. Skin and soft tissues anesthetized with 2 mL of 1% lidocaine. 22 gauge needle was advanced into the spinal canal where clear colorless CSF was encountered with an opening pressure of 15 cm H2O (LLD position). 12.5 mL of CSF was obtained in 4 tubes for requested analysis. Procedure tolerated very well by patient without immediate complication. IMPRESSION: Fluoroscopic guided lumbar puncture as above. Electronically Signed   By: Lavonia Dana M.D.   On: 12/08/2018 18:13        Scheduled Meds:  Chlorhexidine Gluconate Cloth  6 each Topical Daily   estradiol  0.1 mg Transdermal Weekly   fluticasone  1 spray Each Nare Daily   loratadine  10 mg Oral Daily   pantoprazole  40 mg Oral Daily   Continuous Infusions:  IMMUNE GLOBULIN 10% (HUMAN) IV - For Fluid Restriction Only Stopped (12/09/18 2332)     LOS: 2 days    Time spent: 30 minutes    Arafat Cocuzza Darleen Crocker, DO [facility] Pager [phone]  If 7PM-7AM, please contact night-coverage [email or web address] Password [facility] 12/10/2018, 8:51 AM

## 2018-12-10 NOTE — Progress Notes (Signed)
Dr. Maudie Mercury notified patient's HR has been in 46s and low 50s while sleeping.  Remaining vs have been WNL.

## 2018-12-10 NOTE — Progress Notes (Signed)
Patient performed -80 NIF and 1.6L FVC

## 2018-12-11 MED ORDER — ACETAMINOPHEN 325 MG PO TABS
650.0000 mg | ORAL_TABLET | Freq: Four times a day (QID) | ORAL | Status: DC | PRN
  Administered 2018-12-11 – 2018-12-15 (×9): 650 mg via ORAL
  Filled 2018-12-11 (×9): qty 2

## 2018-12-11 MED ORDER — NYSTATIN 100000 UNIT/GM EX POWD
Freq: Two times a day (BID) | CUTANEOUS | Status: DC
  Administered 2018-12-11 – 2018-12-14 (×4): via TOPICAL
  Filled 2018-12-11 (×2): qty 15

## 2018-12-11 NOTE — Progress Notes (Signed)
PROGRESS NOTE    Paula Sutton  [id number] DOB: Jul 16, 1955 DOA: 12/08/2018 PCP: Celene Squibb, MD   Brief Narrative:  Per HPI: Paula Sutton a 63 y.o.femalewith a history of GERD, dyslipidemia, arthritis, recent carpal tunnel release on the right upper extremity. Patient seen for sudden onset of lower extremity weakness with paresthesias and cramping that started this morning around 6 AM. She reports no new medications other than hydrocodone that she started yesterday after her carpal tunnel release. Left lower extremity weakness is greater than right. No palliating or provoking factors. She has not had a flu or any other vaccine recently. She denies diarrhea, nausea, vomiting, headache, chest pain, shortness of breath. No rashes. No loss of bladder or bowel function and reports no new injuries, trauma, or fall.  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 Neurology has assessed patient and she will require 5 days worth of IVIG as well as PT evaluation. She is maintaining her forced vital capacity on repeat respiratory therapy evaluations. She is complaining of constipation this morning.  9/27: Patient has completed 2 doses of IVIG thus far with 3 doses remaining.  We will plan to get up into chair today and use some Flonase to assist with her sinus headache.  Labetalol ordered as needed for blood pressure elevations.  Okay to transfer to [place] today with respiratory therapy following FVC currently at 1.5 L.  9/28: Patient is complaining of some bilateral inguinal hyperesthesia.  Will try nystatin powder to the area.  No other acute complaints or concerns noted.  She ultimately did have a bowel movement and is now tolerating a diet.  Assessment & Plan:   Principal Problem:   Leg weakness, bilateral Active Problems:   GERD (gastroesophageal reflux disease)    IBS (irritable bowel syndrome)   Acute bilateral lower extremity weakness secondary to Guillan-Barr syndrome -Appreciate neurology recommendations with IVIG for 5 doses -Continue to monitor forced vital capacity which appears to be adequate for now -Repeat a.m. labs -PT evaluation with likely need for SNF/rehab  IBS with constipation -BM noted on 9/27 -Continue Linzess -Enema ordered as needed  GERD -Continue PPI  Recent carpal tunnel surgery  DVT prophylaxis:SCDs Code Status:Full Family Communication:None at bedside Disposition Plan:Continue treatment with IVIG daily for 5 days. PT evaluation with likely need for SNF/rehab until further improvement.    Continue ongoing treatments.   Consultants:  Neurology  Procedures:  Lumbar puncture performed 9/25  Antimicrobials:  Anti-infectives (From admission, onward)   None       Subjective: Patient seen and evaluated today with no new acute complaints or concerns. No acute concerns or events noted overnight.  She thinks that she is having some abdominal issues with inguinal hyper sensation.  No nausea or vomiting or abdominal tenderness noted.  She did have a bowel movement last night.  Objective: Vitals:   12/10/18 2053 12/10/18 2107 12/10/18 2350 12/11/18 0556  BP: (!) 158/84 (!) 161/90 (!) 162/85 (!) 150/96  Pulse: 60  65 68  Resp:  19 18   Temp: 98.3 F (36.8 C) 98.5 F (36.9 C) 98.3 F (36.8 C) 98.2 F (36.8 C)  TempSrc: Oral Oral Oral Oral  SpO2: 98% 100% 97% 98%  Weight:      Height:        Intake/Output Summary (Last 24 hours) at 12/11/2018 1300 Last data filed at 12/10/2018 1753 Gross per  24 hour  Intake -  Output 550 ml  Net -550 ml   Filed Weights   12/08/18 0822 12/09/18 1037 12/10/18 0500  Weight: 72.6 kg 73.9 kg 76.2 kg    Examination:  General exam: Appears calm and comfortable  Respiratory system: Clear to auscultation. Respiratory effort normal. Cardiovascular  system: S1 & S2 heard, RRR. No JVD, murmurs, rubs, gallops or clicks. No pedal edema. Gastrointestinal system: Abdomen is nondistended, soft and nontender. No organomegaly or masses felt. Normal bowel sounds heard. Central nervous system: Alert and oriented. No focal neurological deficits. Extremities: Symmetric 5 x 5 power. Skin: No rashes, lesions or ulcers Psychiatry: Judgement and insight appear normal. Mood & affect appropriate.     Data Reviewed: I have personally reviewed following labs and imaging studies  CBC: Recent Labs  Lab 12/08/18 0855 12/09/18 0616 12/10/18 0504  WBC 14.8* 9.8 6.2  NEUTROABS 11.6*  --   --   HGB 15.0 13.6 13.3  HCT 47.3* 43.4 41.4  MCV 93.5 94.3 92.4  PLT 269 243 [id number]   Basic Metabolic Panel: Recent Labs  Lab 12/08/18 0855 12/09/18 0616 12/10/18 0504  NA 140 140 140  K 3.6 4.3 3.6  CL 107 109 111  CO2 22 24 23   GLUCOSE 135* 106* 99  BUN 15 16 14   CREATININE 0.90 0.81 0.68  CALCIUM 9.6 9.3 8.8*  MG 2.5*  --   --    GFR: Estimated Creatinine Clearance: 68.5 mL/min (by C-G formula based on SCr of 0.68 mg/dL). Liver Function Tests: No results for input(s): AST, ALT, ALKPHOS, BILITOT, PROT, ALBUMIN in the last 168 hours. No results for input(s): LIPASE, AMYLASE in the last 168 hours. No results for input(s): AMMONIA in the last 168 hours. Coagulation Profile: No results for input(s): INR, PROTIME in the last 168 hours. Cardiac Enzymes: No results for input(s): CKTOTAL, CKMB, CKMBINDEX, TROPONINI in the last 168 hours. BNP (last 3 results) No results for input(s): PROBNP in the last 8760 hours. HbA1C: No results for input(s): HGBA1C in the last 72 hours. CBG: No results for input(s): GLUCAP in the last 168 hours. Lipid Profile: No results for input(s): CHOL, HDL, LDLCALC, TRIG, CHOLHDL, LDLDIRECT in the last 72 hours. Thyroid Function Tests: No results for input(s): TSH, T4TOTAL, FREET4, T3FREE, THYROIDAB in the last 72 hours.  Anemia Panel: No results for input(s): VITAMINB12, FOLATE, FERRITIN, TIBC, IRON, RETICCTPCT in the last 72 hours. Sepsis Labs: No results for input(s): PROCALCITON, LATICACIDVEN in the last 168 hours.  Recent Results (from the past 240 hour(s))  CSF culture     Status: None (Preliminary result)   Collection Time: 12/08/18  5:08 PM   Specimen: CSF; Cerebrospinal Fluid  Result Value Ref Range Status   Specimen Description   Final    CSF Performed at [facility], [address], [place], [place] [address]    Special Requests   Final    NONE Performed at [facility], [address] [place], [place] [address]    Gram Stain   Final    CYTOSPIN SMEAR NO CELLS OR ORGANISMS SEEN Gram Stain Report Called to,Read Back By and Verified With: SAPPELT. J. AT 1908 12/08/2018 BY EVA Performed at [facility], [address], [place], [place] [address]    Culture   Final    NO GROWTH 3 DAYS Performed at [facility], [place] [address] [place], [place] [address]    Report Status PENDING  Incomplete  SARS Coronavirus 2 [facility]  order, Performed in [facility] hospital lab) Nasopharyngeal Nasopharyngeal Swab     Status: None   Collection Time: 12/08/18  7:20 PM   Specimen: Nasopharyngeal Swab  Result Value Ref Range Status   SARS Coronavirus 2 NEGATIVE NEGATIVE Final    Comment: (NOTE) If result is NEGATIVE SARS-CoV-2 target nucleic acids are NOT DETECTED. The SARS-CoV-2 RNA is generally detectable in upper and lower  respiratory specimens during the acute phase of infection. The lowest  concentration of SARS-CoV-2 viral copies this assay can detect is 250  copies / mL. A negative result does not preclude SARS-CoV-2 infection  and should not be used as the sole basis for treatment or other  patient management decisions.  A negative result may occur with  improper specimen collection / handling, submission of specimen other  than nasopharyngeal swab, presence of viral mutation(s) within the   areas targeted by this assay, and inadequate number of viral copies  (<250 copies / mL). A negative result must be combined with clinical  observations, patient history, and epidemiological information. If result is POSITIVE SARS-CoV-2 target nucleic acids are DETECTED. The SARS-CoV-2 RNA is generally detectable in upper and lower  respiratory specimens dur ing the acute phase of infection.  Positive  results are indicative of active infection with SARS-CoV-2.  Clinical  correlation with patient history and other diagnostic information is  necessary to determine patient infection status.  Positive results do  not rule out bacterial infection or co-infection with other viruses. If result is PRESUMPTIVE POSTIVE SARS-CoV-2 nucleic acids MAY BE PRESENT.   A presumptive positive result was obtained on the submitted specimen  and confirmed on repeat testing.  While 2019 novel coronavirus  (SARS-CoV-2) nucleic acids may be present in the submitted sample  additional confirmatory testing may be necessary for epidemiological  and / or clinical management purposes  to differentiate between  SARS-CoV-2 and other Sarbecovirus currently known to infect humans.  If clinically indicated additional testing with an alternate test  methodology [phone]) is advised. The SARS-CoV-2 RNA is generally  detectable in upper and lower respiratory sp ecimens during the acute  phase of infection. The expected result is Negative. Fact Sheet for Patients:  StrictlyIdeas.no Fact Sheet for Healthcare Providers: BankingDealers.co.za This test is not yet approved or cleared by the [place] FDA and has been authorized for detection and/or diagnosis of SARS-CoV-2 by FDA under an Emergency Use Authorization (EUA).  This EUA will remain in effect (meaning this test can be used) for the duration of the COVID-19 declaration under Section 564(b)(1) of the Act, 21 U.S.C.  section 360bbb-3(b)(1), unless the authorization is terminated or revoked sooner. Performed at [facility], [address], [place], [place] [address]   MRSA PCR Screening     Status: None   Collection Time: 12/09/18 10:39 AM   Specimen: Nasal Mucosa; Nasopharyngeal  Result Value Ref Range Status   MRSA by PCR NEGATIVE NEGATIVE Final    Comment:        The GeneXpert MRSA Assay (FDA approved for NASAL specimens only), is one component of a comprehensive MRSA colonization surveillance program. It is not intended to diagnose MRSA infection nor to guide or monitor treatment for MRSA infections. Performed at [facility], [address] [place], [place] [address]          Radiology Studies: No results found.      Scheduled Meds: . Chlorhexidine Gluconate Cloth  6 each Topical Daily  . estradiol  0.1 mg Transdermal  Weekly  . fluticasone  1 spray Each Nare Daily  . loratadine  10 mg Oral Daily  . nystatin   Topical BID  . pantoprazole  40 mg Oral Daily   Continuous Infusions: . IMMUNE GLOBULIN 10% (HUMAN) IV - For Fluid Restriction Only Stopped (12/10/18 2350)     LOS: 3 days    Time spent: 30 minutes    Tiearra Colwell Darleen Crocker, DO [facility] Pager [phone]  If 7PM-7AM, please contact night-coverage [email or web address] Password [email or web address] 12/11/2018, 1:00 PM

## 2018-12-11 NOTE — Progress Notes (Addendum)
[place] A. Merlene Laughter, MD     [email or web address]          Paula Sutton is an 63 y.o. female.   Assessment/Plan: 1.  Guillain-Barr syndrome: The patient is actually improved mildly.  She is to complete her fourth date today.  She complains of pain and therefore hydrocodone will be added.  She actually is to get 5-day course of the IV immunoglobulins.  This would be updated.  Physical and occupational therapies will be needed.  Again, the patient should start improving usually after 1 month and improvement takes about 4 months on average.  This is discussed at length with the patient and her husband.  She does not complain of dyspnea and her FVC and NIF have both been stable.  It appears that her SCDs are off and I will therefore put her on Lovenox for now.     GENERAL: This is a pleasant female who is in no acute distress.  HEENT: This is normal  ABDOMEN: soft  EXTREMITIES: No edema; the right forearm and hand are in a postoperative wrap.  BACK: Normal  SKIN: Normal by inspection.    MENTAL STATUS: Alert and oriented. Speech, language and cognition are generally intact. Judgment and insight normal.   CRANIAL NERVES: Pupils are equal, round and reactive to light and accomodation; extra ocular movements are full, there is no significant nystagmus; visual fields are full; upper and lower facial muscles are normal in strength and symmetric, there is no flattening of the nasolabial folds; tongue is midline; uvula is midline; shoulder elevation is normal.  MOTOR: Normal tone, bulk and strength in the upper extremities; no pronator drift.  Right lower extremity hip flexion 2/5 and dorsiflexion 4-/5.  Left lower extremity hip flexion 0/5 and dorsiflexion 2/5.  The tone in the lower extremities is reduced.  COORDINATION: Left finger to nose is normal, right finger to nose is normal, No rest tremor; no intention tremor; no postural tremor; no bradykinesia.   REFLEXES: Deep tendon reflexes are symmetrical and normal but absent at the knees and patella. Plantar reflexes are flexor bilaterally.   SENSATION: Markedly to diminish pain, pinprick and light touch involving the lower extremities all the way to the buttocks region and involving S1-S3.    NIF -80 FVC 1.7L  Objective: Vital signs in last 24 hours: Temp:  [98.2 F (36.8 C)-99 F (37.2 C)] 98.2 F (36.8 C) (09/28 1600) Pulse Rate:  [60-71] 71 (09/28 1600) Resp:  [18-19] 18 (09/28 1600) BP: (142-166)/(84-100) 142/100 (09/28 1600) SpO2:  [97 %-100 %] 100 % (09/28 1600)  Intake/Output from previous day: 09/27 0701 - 09/28 0700 In: -  Out: 550 [Urine:550] Intake/Output this shift: No intake/output data recorded. Nutritional status:  Diet Order            Diet regular Room service appropriate? Yes; Fluid consistency: Thin  Diet effective now               Lab Results: Results for orders placed or performed during the hospital encounter of 12/08/18 (from the past 48 hour(s))  Basic metabolic panel     Status: Abnormal   Collection Time: 12/10/18  5:04 AM  Result Value Ref Range   Sodium 140 135 - 145 mmol/L   Potassium 3.6 3.5 - 5.1 mmol/L   Chloride 111 98 - 111 mmol/L   CO2 23 22 - 32 mmol/L   Glucose, Bld 99 70 - 99 mg/dL   BUN 14 8 -  23 mg/dL   Creatinine, Ser 0.68 0.44 - 1.00 mg/dL   Calcium 8.8 (L) 8.9 - 10.3 mg/dL   GFR calc non Af Amer >60 >60 mL/min   GFR calc Af Amer >60 >60 mL/min   Anion gap 6 5 - 15    Comment: Performed at [facility], [address], [place], [place] [address]  CBC     Status: None   Collection Time: 12/10/18  5:04 AM  Result Value Ref Range   WBC 6.2 4.0 - 10.5 K/uL   RBC 4.48 3.87 - 5.11 MIL/uL   Hemoglobin 13.3 12.0 - 15.0 g/dL   HCT 41.4 36.0 - 46.0 %   MCV 92.4 80.0 - 100.0 fL   MCH 29.7 26.0 - 34.0 pg   MCHC 32.1 30.0 - 36.0 g/dL   RDW 14.9 11.5 - 15.5 %   Platelets 228 150 - 400 K/uL   nRBC 0.0 0.0 - 0.2 %     Comment: Performed at [facility], [address], [place], [place] [address]    Lipid Panel No results for input(s): CHOL, TRIG, HDL, CHOLHDL, VLDL, LDLCALC in the last 72 hours.  Studies/Results:   Medications:  Scheduled Meds: . Chlorhexidine Gluconate Cloth  6 each Topical Daily  . estradiol  0.1 mg Transdermal Weekly  . fluticasone  1 spray Each Nare Daily  . loratadine  10 mg Oral Daily  . nystatin   Topical BID  . pantoprazole  40 mg Oral Daily   Continuous Infusions: . IMMUNE GLOBULIN 10% (HUMAN) IV - For Fluid Restriction Only Stopped (12/10/18 2350)   PRN Meds:.ketorolac, labetalol, linaclotide, ondansetron **OR** ondansetron (ZOFRAN) IV, polyethylene glycol, sodium phosphate     LOS: 3 days   Kerrianne Jeng A. Merlene Laughter, M.D.  Diplomate, Tax adviser of Psychiatry and Neurology ( Neurology).

## 2018-12-11 NOTE — Progress Notes (Signed)
Patient NIF: -80 and FVC 1.5L

## 2018-12-11 NOTE — Progress Notes (Signed)
NIF -80 FVC 1.7L

## 2018-12-11 NOTE — Progress Notes (Signed)
NIF: -80 and FVC 1.35 L was done with patient.

## 2018-12-12 LAB — BASIC METABOLIC PANEL
Anion gap: 8 (ref 5–15)
BUN: 21 mg/dL (ref 8–23)
CO2: 21 mmol/L — ABNORMAL LOW (ref 22–32)
Calcium: 8.8 mg/dL — ABNORMAL LOW (ref 8.9–10.3)
Chloride: 108 mmol/L (ref 98–111)
Creatinine, Ser: 0.8 mg/dL (ref 0.44–1.00)
GFR calc Af Amer: >60 mL/min (ref >60)
GFR calc non Af Amer: >60 mL/min (ref >60)
Glucose, Bld: 94 mg/dL (ref 70–99)
Potassium: 4 mmol/L (ref 3.5–5.1)
Sodium: 137 mmol/L (ref 135–145)

## 2018-12-12 LAB — CBC
HCT: 41.9 % (ref 36.0–46.0)
Hemoglobin: 13.4 g/dL (ref 12.0–15.0)
MCH: 29.8 pg (ref 26.0–34.0)
MCHC: 32 g/dL (ref 30.0–36.0)
MCV: 93.3 fL (ref 80.0–100.0)
Platelets: 202 10*3/uL (ref 150–400)
RBC: 4.49 MIL/uL (ref 3.87–5.11)
RDW: 14.6 % (ref 11.5–15.5)
WBC: 5.4 10*3/uL (ref 4.0–10.5)
nRBC: 0 % (ref 0.0–0.2)

## 2018-12-12 LAB — CYTOLOGY - NON PAP

## 2018-12-12 LAB — OLIGOCLONAL BANDS, CSF + SERM

## 2018-12-12 LAB — CSF CULTURE W GRAM STAIN: Culture: NO GROWTH

## 2018-12-12 MED ORDER — LINACLOTIDE 72 MCG PO CAPS
72.0000 ug | ORAL_CAPSULE | Freq: Every day | ORAL | Status: DC
  Administered 2018-12-12 – 2018-12-15 (×4): 72 ug via ORAL
  Filled 2018-12-12 (×7): qty 1

## 2018-12-12 MED ORDER — IMMUNE GLOBULIN (HUMAN) 10 GM/100ML IV SOLN
400.0000 mg/kg | INTRAVENOUS | Status: AC
  Administered 2018-12-12: 14:00:00 30 g via INTRAVENOUS
  Filled 2018-12-12: qty 200

## 2018-12-12 MED ORDER — ENOXAPARIN SODIUM 40 MG/0.4ML ~~LOC~~ SOLN
40.0000 mg | SUBCUTANEOUS | Status: DC
  Administered 2018-12-12 – 2018-12-15 (×4): 40 mg via SUBCUTANEOUS
  Filled 2018-12-12 (×4): qty 0.4

## 2018-12-12 MED ORDER — HYDROCODONE-ACETAMINOPHEN 7.5-325 MG PO TABS
1.0000 | ORAL_TABLET | Freq: Four times a day (QID) | ORAL | Status: DC | PRN
  Administered 2018-12-12: 11:00:00 1 via ORAL
  Filled 2018-12-12 (×2): qty 1

## 2018-12-12 NOTE — TOC Initial Note (Signed)
Transition of Care [facility]) - Initial/Assessment Note    Patient Details  Name: Paula Sutton MRN: [id number] Date of Birth: [birth year]  [facility]) CM/SW Contact:    Ihor Gully, LCSW Phone Number: 12/12/2018, 1:30 PM  Clinical Narrative:                 Patient from home with spouse. Admitted for leg weakness, bilateral. At baseline, patient is independent, drives and has no needs.  Discussed PT recommendation of SNF. Patient agreeable and gave choices. Referrals sent to provider choices.         Patient Goals and CMS Choice Patient states their goals for this hospitalization and ongoing recovery are:: To get back to baseline and being independent. CMS Medicare.gov Compare Post Acute Care list provided to:: Patient Choice offered to / list presented to : Patient  Expected Discharge Plan and Services       Post Acute Care Choice: [place] Living arrangements for the past 2 months: Single Family Home                                      Prior Living Arrangements/Services Living arrangements for the past 2 months: Single Family Home Lives with:: Spouse Patient language and need for interpreter reviewed:: Yes Do you feel safe going back to the place where you live?: Yes      Need for Family Participation in Patient Care: Yes (Comment) Care giver support system in place?: Yes (comment)   Criminal Activity/Legal Involvement Pertinent to Current Situation/Hospitalization: No - Comment as needed  Activities of Daily Living Home Assistive Devices/Equipment: None ADL Screening (condition at time of admission) Patient's cognitive ability adequate to safely complete daily activities?: Yes Is the patient deaf or have difficulty hearing?: No Does the patient have difficulty seeing, even when wearing glasses/contacts?: No Does the patient have difficulty concentrating, remembering, or making decisions?: No Patient able to express need for  assistance with ADLs?: Yes Does the patient have difficulty dressing or bathing?: Yes Independently performs ADLs?: Yes (appropriate for developmental age) Does the patient have difficulty walking or climbing stairs?: Yes Weakness of Legs: Both Weakness of Arms/Hands: None  Permission Sought/Granted                  Emotional Assessment Appearance:: Appears stated age   Affect (typically observed): Accepting Orientation: : Oriented to Self, Oriented to Place, Oriented to  Time, Oriented to Situation Alcohol / Substance Use: Not Applicable Psych Involvement: No (comment)  Admission diagnosis:  Guillain Barr syndrome ([place]) [G61.0] Patient Active Problem List   Diagnosis Date Noted  . Leg weakness, bilateral 12/08/2018  . IBS (irritable bowel syndrome) 12/08/2018  . GERD (gastroesophageal reflux disease) 09/14/2018  . 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 Abdominal pain, epigastric 06/03/2017  . Constipation 01/27/2016  . History of adenomatous polyp of colon 01/27/2016   PCP:  Celene Squibb, MD Pharmacy:   [id number] - [place], [place] [address] [place] [address] Phone: [phone] Fax: [phone]     Social Determinants of Health (SDOH) Interventions    Readmission Risk Interventions No flowsheet data found.

## 2018-12-12 NOTE — Progress Notes (Signed)
PROGRESS NOTE    Paula GERDEMAN  [id number] DOB: 16-Dec-1955 DOA: 12/08/2018 PCP: Celene Squibb, MD   Brief Narrative:  Per HPI: Paula Sutton a 63 y.o.femalewith a history of GERD, dyslipidemia, arthritis, recent carpal tunnel release on the right upper extremity. Patient seen for sudden onset of lower extremity weakness with paresthesias and cramping that started this morning around 6 AM. She reports no new medications other than hydrocodone that she started yesterday after her carpal tunnel release. Left lower extremity weakness is greater than right. No palliating or provoking factors. She has not had a flu or any other vaccine recently. She denies diarrhea, nausea, vomiting, headache, chest pain, shortness of breath. No rashes. No loss of bladder or bowel function and reports no new injuries, trauma, or fall.  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 Neurology has assessed patient and she will require 5 days worth of IVIG as well as PT evaluation. She is maintaining her forced vital capacity on repeat respiratory therapy evaluations. She is complaining of constipation this morning.  9/27: Patient has completed 2 doses of IVIG thus far with 3 doses remaining. We will plan to get up into chair today and use some Flonase to assist with her sinus headache. Labetalol ordered as needed for blood pressure elevations. Okay to transfer to [facility] today with respiratory therapy following FVC currently at 1.5 L.  9/28: Patient is complaining of some bilateral inguinal hyperesthesia.  Will try nystatin powder to the area.  No other acute complaints or concerns noted.  She ultimately did have a bowel movement and is now tolerating a diet.  9/29: Patient has very minimal bilateral hip pain noted.  She requires IVIG for 1 more day and PT evaluation has been ordered.   Linzess will be scheduled.  Assessment & Plan:   Principal Problem:   Leg weakness, bilateral Active Problems:   GERD (gastroesophageal reflux disease)   IBS (irritable bowel syndrome)   Acute bilateral lower extremity weakness secondary to Guillan-Barr syndrome -Appreciate neurology recommendations with IVIG for 5 doses with 1 more dose remaining -Continue to monitor forced vital capacitywhich appears to be adequate for now -Repeat a.m. labs -PT evaluation with likely need for SNF/rehab that is still pending  IBS with constipation -BM noted on 9/27 -Continue Linzess now scheduled and senna as needed -Enema ordered as needed  GERD -Continue PPI  Recent carpal tunnel surgery -Plans to remove dressings today  DVT prophylaxis:Lovenox Code Status:Full Family Communication:None at bedside Disposition Plan:Continue treatment with IVIG daily for 5 days with 1 more day remaining. PT evaluation with likely need for SNF/rehab until further improvement.  Continue ongoing treatments.   Consultants:  Neurology  Procedures:  Lumbar puncture performed 9/25  Antimicrobials:  Anti-infectives (From admission, onward)   None       Subjective: Patient seen and evaluated today with no new acute complaints or concerns. No acute concerns or events noted overnight.  She continues to have some bilateral hip pain and lower abdominal pain.  Objective: Vitals:   12/11/18 1600 12/11/18 2031 12/11/18 2107 12/12/18 0722  BP: (!) 142/100 (!) 148/90 (!) 151/89 129/81  Pulse: 71 75 71 74  Resp: 18 18  20   Temp: 98.2 F (36.8 C)  98.3 F (36.8 C) 97.9 F (36.6 C)  TempSrc:   Oral Oral  SpO2: 100% 100% 97% 95%  Weight:  Height:        Intake/Output Summary (Last 24 hours) at 12/12/2018 1214 Last data filed at 12/12/2018 0900 Gross per 24 hour  Intake 480 ml  Output 1850 ml  Net -1370 ml   Filed Weights   12/08/18 0822 12/09/18 1037 12/10/18 0500  Weight:  72.6 kg 73.9 kg 76.2 kg    Examination:  General exam: Appears calm and comfortable  Respiratory system: Clear to auscultation. Respiratory effort normal. Cardiovascular system: S1 & S2 heard, RRR. No JVD, murmurs, rubs, gallops or clicks. No pedal edema. Gastrointestinal system: Abdomen is nondistended, soft and nontender. No organomegaly or masses felt. Normal bowel sounds heard. Central nervous system: Alert and oriented. No focal neurological deficits. Extremities: Symmetric 5 x 5 power. Skin: No rashes, lesions or ulcers Psychiatry: Judgement and insight appear normal. Mood & affect appropriate.     Data Reviewed: I have personally reviewed following labs and imaging studies  CBC: Recent Labs  Lab 12/08/18 0855 12/09/18 0616 12/10/18 0504 12/12/18 0507  WBC 14.8* 9.8 6.2 5.4  NEUTROABS 11.6*  --   --   --   HGB 15.0 13.6 13.3 13.4  HCT 47.3* 43.4 41.4 41.9  MCV 93.5 94.3 92.4 93.3  PLT 269 243 228 [id number]   Basic Metabolic Panel: Recent Labs  Lab 12/08/18 0855 12/09/18 0616 12/10/18 0504 12/12/18 0507  NA 140 140 140 137  K 3.6 4.3 3.6 4.0  CL 107 109 111 108  CO2 22 24 23  21*  GLUCOSE 135* 106* 99 94  BUN 15 16 14 21   CREATININE 0.90 0.81 0.68 0.80  CALCIUM 9.6 9.3 8.8* 8.8*  MG 2.5*  --   --   --    GFR: Estimated Creatinine Clearance: 68.5 mL/min (by C-G formula based on SCr of 0.8 mg/dL). Liver Function Tests: No results for input(s): AST, ALT, ALKPHOS, BILITOT, PROT, ALBUMIN in the last 168 hours. No results for input(s): LIPASE, AMYLASE in the last 168 hours. No results for input(s): AMMONIA in the last 168 hours. Coagulation Profile: No results for input(s): INR, PROTIME in the last 168 hours. Cardiac Enzymes: No results for input(s): CKTOTAL, CKMB, CKMBINDEX, TROPONINI in the last 168 hours. BNP (last 3 results) No results for input(s): PROBNP in the last 8760 hours. HbA1C: No results for input(s): HGBA1C in the last 72 hours. CBG: No results  for input(s): GLUCAP in the last 168 hours. Lipid Profile: No results for input(s): CHOL, HDL, LDLCALC, TRIG, CHOLHDL, LDLDIRECT in the last 72 hours. Thyroid Function Tests: No results for input(s): TSH, T4TOTAL, FREET4, T3FREE, THYROIDAB in the last 72 hours. Anemia Panel: No results for input(s): VITAMINB12, FOLATE, FERRITIN, TIBC, IRON, RETICCTPCT in the last 72 hours. Sepsis Labs: No results for input(s): PROCALCITON, LATICACIDVEN in the last 168 hours.  Recent Results (from the past 240 hour(s))  CSF culture     Status: None   Collection Time: 12/08/18  5:08 PM   Specimen: CSF; Cerebrospinal Fluid  Result Value Ref Range Status   Specimen Description   Final    CSF Performed at [facility], [address], [place], [place] [address]    Special Requests   Final    NONE Performed at [facility], [address], [place], [place] [address]    Gram Stain   Final    CYTOSPIN SMEAR NO CELLS OR ORGANISMS SEEN Gram Stain Report Called to,Read Back By and Verified With: SAPPELT. J. AT 1908 12/08/2018 BY EVA Performed at [facility],  [address], [place], [place] [address]    Culture   Final    NO GROWTH 3 DAYS Performed at [facility] [address] [place], [place] [address]    Report Status 12/12/2018 FINAL  Final  SARS Coronavirus 2 [facility] order, Performed in [facility] hospital lab) Nasopharyngeal Nasopharyngeal Swab     Status: None   Collection Time: 12/08/18  7:20 PM   Specimen: Nasopharyngeal Swab  Result Value Ref Range Status   SARS Coronavirus 2 NEGATIVE NEGATIVE Final    Comment: (NOTE) If result is NEGATIVE SARS-CoV-2 target nucleic acids are NOT DETECTED. The SARS-CoV-2 RNA is generally detectable in upper and lower  respiratory specimens during the acute phase of infection. The lowest  concentration of SARS-CoV-2 viral copies this assay can detect is 250  copies / mL. A negative result does not preclude SARS-CoV-2 infection  and should not be  used as the sole basis for treatment or other  patient management decisions.  A negative result may occur with  improper specimen collection / handling, submission of specimen other  than nasopharyngeal swab, presence of viral mutation(s) within the  areas targeted by this assay, and inadequate number of viral copies  (<250 copies / mL). A negative result must be combined with clinical  observations, patient history, and epidemiological information. If result is POSITIVE SARS-CoV-2 target nucleic acids are DETECTED. The SARS-CoV-2 RNA is generally detectable in upper and lower  respiratory specimens dur ing the acute phase of infection.  Positive  results are indicative of active infection with SARS-CoV-2.  Clinical  correlation with patient history and other diagnostic information is  necessary to determine patient infection status.  Positive results do  not rule out bacterial infection or co-infection with other viruses. If result is PRESUMPTIVE POSTIVE SARS-CoV-2 nucleic acids MAY BE PRESENT.   A presumptive positive result was obtained on the submitted specimen  and confirmed on repeat testing.  While 2019 novel coronavirus  (SARS-CoV-2) nucleic acids may be present in the submitted sample  additional confirmatory testing may be necessary for epidemiological  and / or clinical management purposes  to differentiate between  SARS-CoV-2 and other Sarbecovirus currently known to infect humans.  If clinically indicated additional testing with an alternate test  methodology [phone]) is advised. The SARS-CoV-2 RNA is generally  detectable in upper and lower respiratory sp ecimens during the acute  phase of infection. The expected result is Negative. Fact Sheet for Patients:  StrictlyIdeas.no Fact Sheet for Healthcare Providers: BankingDealers.co.za This test is not yet approved or cleared by the [place] FDA and has been authorized  for detection and/or diagnosis of SARS-CoV-2 by FDA under an Emergency Use Authorization (EUA).  This EUA will remain in effect (meaning this test can be used) for the duration of the COVID-19 declaration under Section 564(b)(1) of the Act, 21 U.S.C. section 360bbb-3(b)(1), unless the authorization is terminated or revoked sooner. Performed at [facility], [address] [place], [place] [address]   MRSA PCR Screening     Status: None   Collection Time: 12/09/18 10:39 AM   Specimen: Nasal Mucosa; Nasopharyngeal  Result Value Ref Range Status   MRSA by PCR NEGATIVE NEGATIVE Final    Comment:        The GeneXpert MRSA Assay (FDA approved for NASAL specimens only), is one component of a comprehensive MRSA colonization surveillance program. It is not intended to diagnose MRSA infection nor to guide or monitor treatment for MRSA infections. Performed  at [facility], [address], [place], [place] [address]          Radiology Studies: No results found.      Scheduled Meds: . Chlorhexidine Gluconate Cloth  6 each Topical Daily  . enoxaparin (LOVENOX) injection  40 mg Subcutaneous Q24H  . estradiol  0.1 mg Transdermal Weekly  . fluticasone  1 spray Each Nare Daily  . linaclotide  72 mcg Oral QAC breakfast  . loratadine  10 mg Oral Daily  . nystatin   Topical BID  . pantoprazole  40 mg Oral Daily   Continuous Infusions: . IMMUNE GLOBULIN 10% (HUMAN) IV - For Fluid Restriction Only       LOS: 4 days    Time spent: 30 minutes    Tiera Mensinger Darleen Crocker, DO [facility] Pager [phone]  If 7PM-7AM, please contact night-coverage [email or web address] Password [facility] 12/12/2018, 12:14 PM

## 2018-12-12 NOTE — Plan of Care (Signed)
  Problem: Acute Rehab PT Goals(only PT should resolve) Goal: Pt Will Go Supine/Side To Sit Outcome: Progressing Flowsheets (Taken 12/12/2018 1417) Pt will go Supine/Side to Sit:  with min guard assist  with minimal assist Goal: Patient Will Transfer Sit To/From Stand Outcome: Progressing Flowsheets (Taken 12/12/2018 1417) Patient will transfer sit to/from stand:  with moderate assist  with maximum assist Goal: Pt Will Transfer Bed To Chair/Chair To Bed Description: Use sliding board if unable to stand Outcome: Progressing Flowsheets (Taken 12/12/2018 1417) Pt will Transfer Bed to Chair/Chair to Bed:  with max assist  with mod assist Goal: Pt Will Ambulate Outcome: Progressing Flowsheets (Taken 12/12/2018 1417) Pt will Ambulate:  10 feet  with moderate assist  with maximum assist  with rolling walker   2:19 PM, 12/12/18 Lonell Grandchild, MPT Physical Therapist with [facility] [phone] office [phone] mobile phone

## 2018-12-12 NOTE — Progress Notes (Signed)
Paula A. Merlene Laughter, MD     [email or web address]          Paula Sutton is an 63 y.o. female.   Assessment/Plan: 1.  Guillain-Barr syndrome: The patient is actually improved mildly.  It appears she got the final dose of the immunoglobulins today.  She has tolerated this well without elevation in blood pressure.  She still complains of pain.  She unfortunately had untoward side effects from the hydrocodone.  She has the physical therapist and did sit up in a chair today.  She will need extensive therapy as her recovery will take few months.   GENERAL: This is a pleasant female who is in no acute distress.  HEENT: This is normal  ABDOMEN: soft  EXTREMITIES: No edema; the right forearm and hand are in a postoperative wrap.  BACK: Normal  SKIN: Normal by inspection.    MENTAL STATUS: Alert and oriented. Speech, language and cognition are generally intact. Judgment and insight normal.   CRANIAL NERVES: Pupils are equal, round and reactive to light and accomodation; extra ocular movements are full, there is no significant nystagmus; visual fields are full; upper and lower facial muscles are normal in strength and symmetric, there is no flattening of the nasolabial folds; tongue is midline; uvula is midline; shoulder elevation is normal.  MOTOR: Normal tone, bulk and strength in the upper extremities; no pronator drift.  Right lower extremity hip flexion 2/5 and dorsiflexion 4-/5.  Left lower extremity hip flexion 0/5 and dorsiflexion 2/5.  The tone in the lower extremities is reduced.  COORDINATION: Left finger to nose is normal, right finger to nose is normal, No rest tremor; no intention tremor; no postural tremor; no bradykinesia.  REFLEXES: Deep tendon reflexes are symmetrical and normal but absent at the knees and patella. Plantar reflexes are flexor bilaterally.   SENSATION: Markedly to diminish pain, pinprick and light touch involving the lower  extremities all the way to the buttocks region and involving S1-S3.    NIF -80 FVC 1.7L  Objective: Vital signs in last 24 hours: Temp:  [97.8 F (36.6 C)-98.3 F (36.8 C)] 98.3 F (36.8 C) (09/29 1420) Pulse Rate:  [71-83] 75 (09/29 1510) Resp:  [18-20] 18 (09/29 1420) BP: (116-151)/(75-90) 148/89 (09/29 1510) SpO2:  [95 %-100 %] 100 % (09/29 1510)  Intake/Output from previous day: 09/28 0701 - 09/29 0700 In: 240 [P.O.:240] Out: 1200 [Urine:1200] Intake/Output this shift: Total I/O In: 480 [P.O.:480] Out: 1250 [Urine:1250] Nutritional status:  Diet Order            Diet regular Room service appropriate? Yes; Fluid consistency: Thin  Diet effective now               Lab Results: Results for orders placed or performed during the hospital encounter of 12/08/18 (from the past 48 hour(s))  Basic metabolic panel     Status: Abnormal   Collection Time: 12/12/18  5:07 AM  Result Value Ref Range   Sodium 137 135 - 145 mmol/L   Potassium 4.0 3.5 - 5.1 mmol/L   Chloride 108 98 - 111 mmol/L   CO2 21 (L) 22 - 32 mmol/L   Glucose, Bld 94 70 - 99 mg/dL   BUN 21 8 - 23 mg/dL   Creatinine, Ser 0.80 0.44 - 1.00 mg/dL   Calcium 8.8 (L) 8.9 - 10.3 mg/dL   GFR calc non Af Amer >60 >60 mL/min   GFR calc Af Amer >60 >60 mL/min  Anion gap 8 5 - 15    Comment: Performed at [facility], [address] [place], [place] [address]  CBC     Status: None   Collection Time: 12/12/18  5:07 AM  Result Value Ref Range   WBC 5.4 4.0 - 10.5 K/uL   RBC 4.49 3.87 - 5.11 MIL/uL   Hemoglobin 13.4 12.0 - 15.0 g/dL   HCT 41.9 36.0 - 46.0 %   MCV 93.3 80.0 - 100.0 fL   MCH 29.8 26.0 - 34.0 pg   MCHC 32.0 30.0 - 36.0 g/dL   RDW 14.6 11.5 - 15.5 %   Platelets 202 150 - 400 K/uL   nRBC 0.0 0.0 - 0.2 %    Comment: Performed at [facility], [address], [place], [place] [address]    Lipid Panel No results for input(s): CHOL, TRIG, HDL, CHOLHDL, VLDL, LDLCALC in the last 72 hours.   Studies/Results:   Medications:  Scheduled Meds: . Chlorhexidine Gluconate Cloth  6 each Topical Daily  . enoxaparin (LOVENOX) injection  40 mg Subcutaneous Q24H  . estradiol  0.1 mg Transdermal Weekly  . fluticasone  1 spray Each Nare Daily  . linaclotide  72 mcg Oral QAC breakfast  . loratadine  10 mg Oral Daily  . nystatin   Topical BID  . pantoprazole  40 mg Oral Daily   Continuous Infusions:  PRN Meds:.acetaminophen, HYDROcodone-acetaminophen, ketorolac, labetalol, ondansetron **OR** ondansetron (ZOFRAN) IV, polyethylene glycol, sodium phosphate     LOS: 4 days   [name] A. Merlene Sutton, M.D.  Diplomate, Tax adviser of Psychiatry and Neurology ( Neurology).

## 2018-12-12 NOTE — [place] FL2 (Signed)
[place] MEDICAID FL2 LEVEL OF CARE SCREENING TOOL     IDENTIFICATION  Patient Name: Paula Sutton Birthdate: 05/22/1955 Sex: female Admission Date (Current Location): 12/08/2018  [facility] and [place] Number:  [employer] and Address:  [place] [address], [place]      Provider Number: [id number]  Attending Physician Name and Address:  Rodena Goldmann, DO  Relative Name and Phone Number:       Current Level of Care: Hospital Recommended Level of Care: [place] Prior Approval Number:    Date Approved/Denied:   PASRR Number: [id number]  Discharge Plan: SNF    Current Diagnoses: Patient Active Problem List   Diagnosis Date Noted  . Leg weakness, bilateral 12/08/2018  . IBS (irritable bowel syndrome) 12/08/2018  . GERD (gastroesophageal reflux disease) 09/14/2018  . 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 Abdominal pain, epigastric 06/03/2017  . Constipation 01/27/2016  . History of adenomatous polyp of colon 01/27/2016    Orientation RESPIRATION BLADDER Height & Weight     Self, Time, Situation, Place  Normal Incontinent Weight: 167 lb 15.9 oz (76.2 kg) Height:  5\' 3"  (160 cm)  BEHAVIORAL SYMPTOMS/MOOD NEUROLOGICAL BOWEL NUTRITION STATUS      Continent Diet(regular)  AMBULATORY STATUS COMMUNICATION OF NEEDS Skin   Limited Assist   Normal                       Personal Care Assistance Level of Assistance  Bathing, Dressing Bathing Assistance: Limited assistance   Dressing Assistance: Independent     Functional Limitations Info  Sight, Speech, Hearing Sight Info: Adequate Hearing Info: Adequate Speech Info: Adequate    SPECIAL CARE FACTORS FREQUENCY  PT (By licensed PT)     PT Frequency: 5x/week              Contractures Contractures Info: Not present    Additional Factors Info  Code Status, Allergies Code Status Info: Full Code Allergies Info: Sulfa antibiotics            Current Medications (12/12/2018):  This is the current hospital active medication list Current Facility-Administered Medications  Medication Dose Route Frequency Provider Last Rate Last Dose  . acetaminophen (TYLENOL) tablet 650 mg  650 mg Oral Q6H PRN Schorr, Rhetta Mura, NP   650 mg at 12/11/18 2000  . Chlorhexidine Gluconate Cloth 2 % PADS 6 each  6 each Topical Daily Manuella Ghazi, Pratik D, DO   6 each at 12/12/18 1031  . enoxaparin (LOVENOX) injection 40 mg  40 mg Subcutaneous Q24H [place]onqu[place], Kofi, MD   40 mg at 12/12/18 1029  . estradiol (CLIMARA - Dosed in mg/24 hr) patch 0.1 mg  0.1 mg Transdermal Weekly Truett Mainland, DO   Stopped at 12/08/18 2008  . fluticasone (FLONASE) 50 MCG/ACT nasal spray 1 spray  1 spray Each Nare Daily Shah, Pratik D, DO   1 spray at 12/12/18 1030  . HYDROcodone-acetaminophen (NORCO) 7.5-325 MG per tablet 1 tablet  1 tablet Oral Q6H PRN Phillips Odor, MD   1 tablet at 12/12/18 1038  . Immune Globulin 10% (PRIVIGEN) IV infusion 30 g  400 mg/kg Intravenous Q24H Doonqu[place], Kofi, MD      . ketorolac (TORADOL) 15 MG/ML injection 15 mg  15 mg Intravenous Q6H PRN Manuella Ghazi, Pratik D, DO   15 mg at 12/11/18 2251  . labetalol (NORMODYNE) injection 5 mg  5 mg Intravenous Q2H PRN Manuella Ghazi,  Pratik D, DO      . linaclotide (LINZESS) capsule 72 mcg  72 mcg Oral QAC breakfast Manuella Ghazi, Pratik D, DO      . loratadine (CLARITIN) tablet 10 mg  10 mg Oral Daily Manuella Ghazi, Pratik D, DO   10 mg at 12/12/18 1028  . nystatin (MYCOSTATIN/NYSTOP) topical powder   Topical BID Manuella Ghazi, Pratik D, DO      . ondansetron [facility]) tablet 4 mg  4 mg Oral Q6H PRN Truett Mainland, DO       Or  . ondansetron [facility]) injection 4 mg  4 mg Intravenous Q6H PRN Truett Mainland, DO      . pantoprazole (PROTONIX) EC tablet 40 mg  40 mg Oral Daily Truett Mainland, DO   40 mg at 12/12/18 1028  . polyethylene glycol (MIRALAX / GLYCOLAX) packet 17 g  17 g Oral Daily PRN Truett Mainland, DO   17 g at 12/08/18 2141  .  sodium phosphate (FLEET) 7-19 GM/118ML enema 1 enema  1 enema Rectal Daily PRN Heath Lark D, DO   1 enema at 12/10/18 1506     Discharge Medications: Please see discharge summary for a list of discharge medications.  Relevant Imaging Results:  Relevant Lab Results:   Additional Information SSN [id number], Clydene Pugh, LCSW

## 2018-12-12 NOTE — Evaluation (Signed)
Physical Therapy Evaluation Patient Details Name: Paula Sutton MRN: [id number] DOB: Jan 24, 1956 Today's Date: 12/12/2018   History of Present Illness  Paula Sutton is a 63 y.o. female with a history of GERD, dyslipidemia, arthritis, recent carpal tunnel release on the right upper extremity.  Patient seen for sudden onset of lower extremity weakness with paresthesias and cramping that started this morning around 6 AM.  She reports no new medications other than hydrocodone that she started yesterday after her carpal tunnel release.  Left lower extremity weakness is greater than right.  No palliating or provoking factors.  She has not had a flu or any other vaccine recently.  She denies diarrhea, nausea, vomiting, headache, chest pain, shortness of breath.  No rashes.  No loss of bladder or bowel function and reports no new injuries, trauma, or fall.    Clinical Impression  Patient unable to lift BLE against gravity due to weakness, at high risk for falls, loses sitting balance when not supporting self with BUE, unable to stand using RW and required use of sliding board to transfer to wc with Mod assist.  Patient encouraged to shift weight off buttocks every 10-15 minutes for pressure relief and tolerated staying up in wc after therapy.  Patient will benefit from continued physical therapy in hospital and recommended venue below to increase strength, balance, endurance for safe ADLs and gait.    Follow Up Recommendations SNF    Equipment Recommendations  Wheelchair (measurements PT);Wheelchair cushion (measurements PT)    Recommendations for Other Services       Precautions / Restrictions Precautions Precautions: Fall Restrictions Weight Bearing Restrictions: No      Mobility  Bed Mobility Overal bed mobility: Needs Assistance Bed Mobility: Supine to Sit     Supine to sit: Min assist;Mod assist     General bed mobility comments: slow labored movement requiring assistance to move  BLE  Transfers Overall transfer level: Needs assistance Equipment used: Sliding board Transfers: Lateral/Scoot Transfers          Lateral/Scoot Transfers: Mod assist General transfer comment: unable to stand due to BLE weakness, required use of sliding board with Mod assist to transfer to wc  Ambulation/Gait                Stairs            Wheelchair Mobility    Modified Rankin (Stroke Patients Only)       Balance Overall balance assessment: Needs assistance Sitting-balance support: Feet supported;No upper extremity supported Sitting balance-Leahy Scale: Poor Sitting balance - Comments: fair/poor with loss of balance when leaning backwards or laterally, able to maintain sitting balance when leaning forward                                     Pertinent Vitals/Pain Pain Assessment: No/denies pain    Home Living Family/patient expects to be discharged to:: Private residence Living Arrangements: Spouse/significant other Available Help at Discharge: Family;Available PRN/intermittently Type of Home: House Home Access: Stairs to enter Entrance Stairs-Rails: None Entrance Stairs-Number of Steps: 1 Home Layout: One level Home Equipment: Walker - 2 wheels;Cane - single point;Shower seat      Prior Function Level of Independence: Independent         Comments: Hydrographic surveyor, drives     Journalist, newspaper        Extremity/Trunk Assessment   Upper Extremity Assessment Upper  Extremity Assessment: Generalized weakness;RUE deficits/detail;LUE deficits/detail RUE Deficits / Details: grossly 4/5 except wrist grossly -4/5 (s/p carpal tunnel surgery) RUE Sensation: WNL RUE Coordination: WNL LUE Deficits / Details: grossly 4+/5 LUE Sensation: WNL LUE Coordination: WNL    Lower Extremity Assessment Lower Extremity Assessment: Generalized weakness;RLE deficits/detail;LLE deficits/detail RLE Deficits / Details: grossly 2/5 RLE Sensation:  WNL RLE Coordination: decreased gross motor LLE Deficits / Details: grossly 2/5 LLE Sensation: WNL LLE Coordination: decreased gross motor    Cervical / Trunk Assessment Cervical / Trunk Assessment: Normal  Communication   Communication: No difficulties  Cognition Arousal/Alertness: Awake/alert Behavior During Therapy: WFL for tasks assessed/performed Overall Cognitive Status: Within Functional Limits for tasks assessed                                        General Comments      Exercises     Assessment/Plan    PT Assessment Patient needs continued PT services  PT Problem List Decreased strength;Decreased activity tolerance;Decreased balance;Decreased mobility       PT Treatment Interventions Balance training;Gait training;Functional mobility training;Therapeutic activities;Therapeutic exercise;Wheelchair mobility training;Patient/family education    PT Goals (Current goals can be found in the Care Plan section)  Acute Rehab PT Goals Patient Stated Goal: return home after rehab PT Goal Formulation: With patient Time For Goal Achievement: 12/26/18 Potential to Achieve Goals: Fair    Frequency Min 3X/week   Barriers to discharge        Co-evaluation               AM-PAC PT "6 Clicks" Mobility  Outcome Measure Help needed turning from your back to your side while in a flat bed without using bedrails?: A Lot Help needed moving from lying on your back to sitting on the side of a flat bed without using bedrails?: A Lot Help needed moving to and from a bed to a chair (including a wheelchair)?: A Lot Help needed standing up from a chair using your arms (e.g., wheelchair or bedside chair)?: Total Help needed to walk in hospital room?: Total Help needed climbing 3-5 steps with a railing? : Total 6 Click Score: 9    End of Session   Activity Tolerance: Patient tolerated treatment well;Patient limited by fatigue Patient left: in chair;with call  bell/phone within reach Nurse Communication: Mobility status PT Visit Diagnosis: Unsteadiness on feet (R26.81);Other abnormalities of gait and mobility (R26.89);Muscle weakness (generalized) (M62.81)    Time: [id number] PT Time Calculation (min) (ACUTE ONLY): 33 min   Charges:   PT Evaluation $PT Eval Moderate Complexity: 1 Mod PT Treatments $Therapeutic Activity: 23-37 mins        2:12 PM, 12/12/18 Lonell Grandchild, MPT Physical Therapist with [facility] [phone] office [phone] mobile phone

## 2018-12-12 NOTE — Progress Notes (Signed)
Patient result for NIF:-80 and FVC 1.6 L

## 2018-12-13 DIAGNOSIS — K581 Irritable bowel syndrome with constipation: Secondary | ICD-10-CM

## 2018-12-13 DIAGNOSIS — K219 Gastro-esophageal reflux disease without esophagitis: Secondary | ICD-10-CM

## 2018-12-13 DIAGNOSIS — G610 Guillain-Barre syndrome: Principal | ICD-10-CM

## 2018-12-13 NOTE — Progress Notes (Signed)
Results for FVC 1.5 and NIF is -80

## 2018-12-13 NOTE — Progress Notes (Addendum)
Inpatient Rehabilitation Admissions Coordinator  Review of chart. I have contacted Dr. Dyann Kief to request an inpt rehab consult for [facility] if pt would like to be considered for CIR admit. Please advise.Heather, SW, also aware of my assessment. We do take patients with GBS with goals for slideboard transfers as well as wheelchair level goals.  Danne Baxter, RN, MSN Rehab Admissions Coordinator [phone] 12/13/2018 1:56 PM

## 2018-12-13 NOTE — Progress Notes (Signed)
PROGRESS NOTE    Paula Sutton  [id number] DOB: 1955-04-12 DOA: 12/08/2018 PCP: Celene Squibb, MD   Brief Narrative:  Per HPI: Paula Sutton a 63 y.o.femalewith a history of GERD, dyslipidemia, arthritis, recent carpal tunnel release on the right upper extremity. Patient seen for sudden onset of lower extremity weakness with paresthesias and cramping that started this morning around 6 AM. She reports no new medications other than hydrocodone that she started yesterday after her carpal tunnel release. Left lower extremity weakness is greater than right. No palliating or provoking factors. She has not had a flu or any other vaccine recently. She denies diarrhea, nausea, vomiting, headache, chest pain, shortness of breath. No rashes. No loss of bladder or bowel function and reports no new injuries, trauma, or fall.  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 Neurology has assessed patient and she will require 5 days worth of IVIG as well as PT evaluation. She is maintaining her forced vital capacity on repeat respiratory therapy evaluations. She is complaining of constipation this morning.  9/27: Patient has completed 2 doses of IVIG thus far with 3 doses remaining. We will plan to get up into chair today and use some Flonase to assist with her sinus headache. Labetalol ordered as needed for blood pressure elevations. Okay to transfer to [place] today with respiratory therapy following FVC currently at 1.5 L.  9/28: Patient is complaining of some bilateral inguinal hyperesthesia.  Will try nystatin powder to the area.  No other acute complaints or concerns noted.  She ultimately did have a bowel movement and is now tolerating a diet.  9/29: Patient has very minimal bilateral hip pain noted.  She requires IVIG for 1 more day and PT evaluation has been ordered.   Linzess will be scheduled.  9/30: Continue to have bilateral hip pain; still with significant weakness on her legs, inability to support herself while trying to stand and having trouble keeping legs up against gravity.  Will require nursing home for rehabilitation and conditioning.  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 SW is aware and helping with placement. -patient is having significant difficulty moving her legs, keeping them up against gravity and supporting herself to stand.   IBS with constipation -BM noted on 9/29 again -Continue Linzess now scheduled and senna as needed -Enema ordered as needed for severe constipation -advise to maintain adequate hydration.  GERD -Continue PPI  Recent carpal tunnel surgery -healing appropriately  -will also need rehab for that.   DVT prophylaxis:Lovenox Code Status:Full Family Communication:None at bedside Disposition Plan:Patient has completed treatment with IVIG x5 days; afebrile and overall well-tolerated.  Physical therapy evaluation has recommended a skilled nursing facility for rehabilitation and conditioning.  Will continue to follow any further recommendations by neurology service.  Consultants:  Neurology  Procedures:  Lumbar puncture performed 9/25  Antimicrobials:  Anti-infectives (From admission, onward)   None      Subjective: No chest pain, no shortness of breath, no nausea, no vomiting.  Reports no major events overnight.  Still having bilateral hip discomfort and  being weak on her legs.  Objective: Vitals:    12/12/18 1510 12/12/18 2130 12/13/18 0654 12/13/18 1400  BP: (!) 148/89 (!) 155/71 139/84 139/77  Pulse: 75 65 66 65  Resp:  16 16 18   Temp:  98.3 F (36.8 C) 98.1 F (36.7 C) 98.2 F (36.8 C)  TempSrc:  Oral Oral Oral  SpO2: 100% 94% 95% 98%  Weight:      Height:        Intake/Output Summary (Last 24 hours) at 12/13/2018 1424 Last data filed at 12/13/2018 1409 Gross per 24 hour  Intake 960 ml  Output 2700 ml  Net -1740 ml   Filed Weights   12/08/18 0822 12/09/18 1037 12/10/18 0500  Weight: 72.6 kg 73.9 kg 76.2 kg    Examination:  General exam: Alert, awake, oriented x 3; afebrile, no chest pain, no shortness of breath, no nausea, no vomiting.  Patient reports her lower back and hip discomfort.  Still significantly weak on her legs bilaterally. Respiratory system: Clear to auscultation. Respiratory effort normal. Cardiovascular system:RRR. No murmurs, rubs, gallops. Gastrointestinal system: Abdomen is nondistended, soft and nontender. No organomegaly or masses felt. Normal bowel sounds heard. Central nervous system: Alert and oriented. No focal neurological deficits. Extremities: No cyanosis or clubbing.  No edema appreciated. Skin: No rashes, lesions or ulcers Psychiatry: Judgement and insight appear normal. Mood & affect appropriate.    Data Reviewed: I have personally reviewed following labs and imaging studies  CBC: Recent Labs  Lab 12/08/18 0855 12/09/18 0616 12/10/18 0504 12/12/18 0507  WBC 14.8* 9.8 6.2 5.4  NEUTROABS 11.6*  --   --   --   HGB 15.0 13.6 13.3 13.4  HCT 47.3* 43.4 41.4 41.9  MCV 93.5 94.3 92.4 93.3  PLT 269 243 228 [id number]   Basic Metabolic Panel: Recent Labs  Lab 12/08/18 0855 12/09/18 0616 12/10/18 0504 12/12/18 0507  NA 140 140 140 137  K 3.6 4.3 3.6 4.0  CL 107 109 111 108  CO2 22 24 23  21*  GLUCOSE 135* 106* 99 94  BUN 15 16 14 21   CREATININE 0.90 0.81 0.68 0.80  CALCIUM 9.6 9.3 8.8* 8.8*  MG 2.5*  --   --   --    GFR:  Estimated Creatinine Clearance: 68.5 mL/min (by C-G formula based on SCr of 0.8 mg/dL).   Recent Results (from the past 240 hour(s))  CSF culture     Status: None   Collection Time: 12/08/18  5:08 PM   Specimen: CSF; Cerebrospinal Fluid  Result Value Ref Range Status   Specimen Description   Final    CSF Performed at [facility], [address] [place], [place] [address]    Special Requests   Final    NONE Performed at [facility], [address] [place], [place] [address]    Gram Stain   Final    CYTOSPIN SMEAR NO CELLS OR ORGANISMS SEEN Gram Stain Report Called to,Read Back By and Verified With: SAPPELT. J. AT 1908 12/08/2018 BY EVA Performed at [facility], [address] [place], [place] [address]    Culture   Final    NO GROWTH 3 DAYS Performed at [facility], [place] [address] [place], [place] [address]    Report Status 12/12/2018 FINAL  Final  SARS Coronavirus 2 [facility] order, Performed in [facility] hospital lab) Nasopharyngeal Nasopharyngeal Swab     Status: None   Collection Time: 12/08/18  7:20 PM   Specimen:  Nasopharyngeal Swab  Result Value Ref Range Status   SARS Coronavirus 2 NEGATIVE NEGATIVE Final    Comment: (NOTE) If result is NEGATIVE SARS-CoV-2 target nucleic acids are NOT DETECTED. The SARS-CoV-2 RNA is generally detectable in upper and lower  respiratory specimens during the acute phase of infection. The lowest  concentration of SARS-CoV-2 viral copies this assay can detect is 250  copies / mL. A negative result does not preclude SARS-CoV-2 infection  and should not be used as the sole basis for treatment or other  patient management decisions.  A negative result may occur with  improper specimen collection / handling, submission of specimen other  than nasopharyngeal swab, presence of viral mutation(s) within the  areas targeted by this assay, and inadequate number of viral copies  (<250 copies / mL). A negative result must be combined  with clinical  observations, patient history, and epidemiological information. If result is POSITIVE SARS-CoV-2 target nucleic acids are DETECTED. The SARS-CoV-2 RNA is generally detectable in upper and lower  respiratory specimens dur ing the acute phase of infection.  Positive  results are indicative of active infection with SARS-CoV-2.  Clinical  correlation with patient history and other diagnostic information is  necessary to determine patient infection status.  Positive results do  not rule out bacterial infection or co-infection with other viruses. If result is PRESUMPTIVE POSTIVE SARS-CoV-2 nucleic acids MAY BE PRESENT.   A presumptive positive result was obtained on the submitted specimen  and confirmed on repeat testing.  While 2019 novel coronavirus  (SARS-CoV-2) nucleic acids may be present in the submitted sample  additional confirmatory testing may be necessary for epidemiological  and / or clinical management purposes  to differentiate between  SARS-CoV-2 and other Sarbecovirus currently known to infect humans.  If clinically indicated additional testing with an alternate test  methodology [phone]) is advised. The SARS-CoV-2 RNA is generally  detectable in upper and lower respiratory sp ecimens during the acute  phase of infection. The expected result is Negative. Fact Sheet for Patients:  StrictlyIdeas.no Fact Sheet for Healthcare Providers: BankingDealers.co.za This test is not yet approved or cleared by the [place] FDA and has been authorized for detection and/or diagnosis of SARS-CoV-2 by FDA under an Emergency Use Authorization (EUA).  This EUA will remain in effect (meaning this test can be used) for the duration of the COVID-19 declaration under Section 564(b)(1) of the Act, 21 U.S.C. section 360bbb-3(b)(1), unless the authorization is terminated or revoked sooner. Performed at [facility], [address] [place], [place] [address]   MRSA PCR Screening     Status: None   Collection Time: 12/09/18 10:39 AM   Specimen: Nasal Mucosa; Nasopharyngeal  Result Value Ref Range Status   MRSA by PCR NEGATIVE NEGATIVE Final    Comment:        The GeneXpert MRSA Assay (FDA approved for NASAL specimens only), is one component of a comprehensive MRSA colonization surveillance program. It is not intended to diagnose MRSA infection nor to guide or monitor treatment for MRSA infections. Performed at [facility] [address] [place], [place] [address]      Radiology Studies: No results found.   Scheduled Meds: . Chlorhexidine Gluconate Cloth  6 each Topical Daily  . enoxaparin (LOVENOX) injection  40 mg Subcutaneous Q24H  . estradiol  0.1 mg Transdermal Weekly  . fluticasone  1 spray Each Nare Daily  . linaclotide  72 mcg Oral QAC breakfast  . loratadine  10  mg Oral Daily  . nystatin   Topical BID  . pantoprazole  40 mg Oral Daily   Continuous Infusions:    LOS: 5 days    Time spent: 30 minutes   Barton Dubois, MD [facility] Pager [phone]  12/13/2018, 2:24 PM

## 2018-12-13 NOTE — Progress Notes (Signed)
NIF -60  FVC 1.6L 

## 2018-12-14 NOTE — Progress Notes (Signed)
PROGRESS NOTE    Paula Sutton  [id number] DOB: Mar 25, 1955 DOA: 12/08/2018 PCP: Celene Squibb, MD   Brief Narrative:  Per HPI: Paula Sutton a 63 y.o.femalewith a history of GERD, dyslipidemia, arthritis, recent carpal tunnel release on the right upper extremity. Patient seen for sudden onset of lower extremity weakness with paresthesias and cramping that started this morning around 6 AM. She reports no new medications other than hydrocodone that she started yesterday after her carpal tunnel release. Left lower extremity weakness is greater than right. No palliating or provoking factors. She has not had a flu or any other vaccine recently. She denies diarrhea, nausea, vomiting, headache, chest pain, shortness of breath. No rashes. No loss of bladder or bowel function and reports no new injuries, trauma, or fall.  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 Neurology has assessed patient and she will require 5 days worth of IVIG as well as PT evaluation. She is maintaining her forced vital capacity on repeat respiratory therapy evaluations. She is complaining of constipation this morning.  9/27: Patient has completed 2 doses of IVIG thus far with 3 doses remaining. We will plan to get up into chair today and use some Flonase to assist with her sinus headache. Labetalol ordered as needed for blood pressure elevations. Okay to transfer to [facility] today with respiratory therapy following FVC currently at 1.5 L.  9/28: Patient is complaining of some bilateral inguinal hyperesthesia.  Will try nystatin powder to the area.  No other acute complaints or concerns noted.  She ultimately did have a bowel movement and is now tolerating a diet.  9/29: Patient has very minimal bilateral hip pain noted.  She requires IVIG for 1 more day and PT evaluation has been ordered.   Linzess will be scheduled.  9/30: Continue to have bilateral hip pain; still with significant weakness on her legs, inability to support herself while trying to stand and having trouble keeping legs up against gravity.  Will require nursing home for rehabilitation and conditioning.  10/1: Continues to have bilateral hip pain and significant weakness on her legs, inability to support herself while trying to stand and having trouble keeping legs up against gravity. Will go to [facility] pending insurance authorization.  Assessment & Plan:   Principal Problem:   Leg weakness, bilateral Active Problems:   GERD (gastroesophageal reflux disease)   IBS (irritable bowel syndrome)   Acute bilateral lower extremity weakness secondary to Guillan-Barr syndrome -Appreciate neurology recommendations  -patient has completed IVIG infusions -will continue to monitor forced vital capacity which appears to be adequate for now -PT evaluation has recommended SNF for rehab. Patient is awaiting insurance authorization process to be admitted to CIR.  -patient continues to have significant difficulty moving her legs, keeping them up against gravity and supporting herself to stand.  Overall with good attitude and looking to participate in rehab.  IBS with constipation -BM noted on 9/29 again -will continue Linzess and senna as needed -advise to maintain adequate hydration.  GERD -will continue PPI  Recent carpal tunnel surgery -healing appropriately  -will also need rehab for that.   DVT prophylaxis:Lovenox Code Status:Full Family Communication:None at bedside Disposition Plan:Patient has completed treatment with IVIG x5 days; afebrile and well-tolerated overall. Patient is awaiting insurance authorization process for possible admit to CIR for rehabilitation and conditioning. Will continue to follow any further recommendations by neurology service.  Consultants:  Neurology  Procedures:   Lumbar puncture performed 9/25  Antimicrobials:  Anti-infectives (From admission, onward)   None      Subjective: No chest pain, no shortness of breath, no nausea, no vomiting. Patient reports no major events overnight. She is still having bilateral lower extremity weakness and having bilateral hip discomfort.   Objective: Vitals:   12/13/18 1400 12/13/18 2124 12/14/18 0528 12/14/18 1409  BP: 139/77 (!) 154/74 (!) 188/92 119/88  Pulse: 65 67 (!) 59 84  Resp: 18 16  18   Temp: 98.2 F (36.8 C) 98.9 F (37.2 C) 98.4 F (36.9 C) 98.5 F (36.9 C)  TempSrc: Oral Oral Oral Oral  SpO2: 98% 98% 95% 98%  Weight:      Height:        Intake/Output Summary (Last 24 hours) at 12/14/2018 1510 Last data filed at 12/14/2018 1419 Gross per 24 hour  Intake 600 ml  Output 1900 ml  Net -1300 ml   Filed Weights   12/08/18 0822 12/09/18 1037 12/10/18 0500  Weight: 72.6 kg 73.9 kg 76.2 kg    Examination:  General exam: Alert, awake, oriented x 3; afebrile, no chest pain, no shortness of breath, no nausea, no vomiting. Continues to be significantly weak on her legs bilaterally. Respiratory system:  Clear to auscultation. Respiratory effort normal.  Cardiovascular system: RRR. No murmurs, gallops, or rubs Gastrointestinal system: Abdomen Abdomen is nondistended, soft and nontender. No organomegaly or masses felt. Positive bowel sounds.  Central nervous system: Alert and oriented. No focal neurological deficits.  Extremities: No cyanosis, clubbing or edema appreciated Skin: No rashes, lesions or ulcers  Psychiatry: Mood and affect appropriate. Judgement and insight appear normal.     Data Reviewed: I have personally reviewed following labs and imaging studies  CBC: Recent Labs  Lab 12/08/18 0855 12/09/18 0616 12/10/18 0504 12/12/18 0507  WBC 14.8* 9.8 6.2 5.4  NEUTROABS 11.6*  --   --   --   HGB 15.0 13.6 13.3 13.4  HCT 47.3* 43.4 41.4 41.9  MCV 93.5 94.3 92.4 93.3  PLT 269 243  228 [id number]   Basic Metabolic Panel: Recent Labs  Lab 12/08/18 0855 12/09/18 0616 12/10/18 0504 12/12/18 0507  NA 140 140 140 137  K 3.6 4.3 3.6 4.0  CL 107 109 111 108  CO2 22 24 23  21*  GLUCOSE 135* 106* 99 94  BUN 15 16 14 21   CREATININE 0.90 0.81 0.68 0.80  CALCIUM 9.6 9.3 8.8* 8.8*  MG 2.5*  --   --   --    GFR: Estimated Creatinine Clearance: 68.5 mL/min (by C-G formula based on SCr of 0.8 mg/dL).   Recent Results (from the past 240 hour(s))  CSF culture     Status: None   Collection Time: 12/08/18  5:08 PM   Specimen: CSF; Cerebrospinal Fluid  Result Value Ref Range Status   Specimen Description   Final    CSF Performed at [facility], [address], [place], [place] [address]    Special Requests   Final    NONE Performed at [facility], [address] [place], [place] [address]    Gram Stain   Final    CYTOSPIN SMEAR NO CELLS OR ORGANISMS SEEN Gram Stain Report Called to,Read Back By and Verified With: SAPPELT. J. AT 1908 12/08/2018 BY EVA Performed at [facility], [address] [place], [place] [address]    Culture   Final    NO GROWTH 3 DAYS  Performed at [facility], [place] [address] [place], [place] [address]    Report Status 12/12/2018 FINAL  Final  SARS Coronavirus 2 [facility] order, Performed in [facility] hospital lab) Nasopharyngeal Nasopharyngeal Swab     Status: None   Collection Time: 12/08/18  7:20 PM   Specimen: Nasopharyngeal Swab  Result Value Ref Range Status   SARS Coronavirus 2 NEGATIVE NEGATIVE Final    Comment: (NOTE) If result is NEGATIVE SARS-CoV-2 target nucleic acids are NOT DETECTED. The SARS-CoV-2 RNA is generally detectable in upper and lower  respiratory specimens during the acute phase of infection. The lowest  concentration of SARS-CoV-2 viral copies this assay can detect is 250  copies / mL. A negative result does not preclude SARS-CoV-2 infection  and should not be used as the sole basis for treatment or other   patient management decisions.  A negative result may occur with  improper specimen collection / handling, submission of specimen other  than nasopharyngeal swab, presence of viral mutation(s) within the  areas targeted by this assay, and inadequate number of viral copies  (<250 copies / mL). A negative result must be combined with clinical  observations, patient history, and epidemiological information. If result is POSITIVE SARS-CoV-2 target nucleic acids are DETECTED. The SARS-CoV-2 RNA is generally detectable in upper and lower  respiratory specimens dur ing the acute phase of infection.  Positive  results are indicative of active infection with SARS-CoV-2.  Clinical  correlation with patient history and other diagnostic information is  necessary to determine patient infection status.  Positive results do  not rule out bacterial infection or co-infection with other viruses. If result is PRESUMPTIVE POSTIVE SARS-CoV-2 nucleic acids MAY BE PRESENT.   A presumptive positive result was obtained on the submitted specimen  and confirmed on repeat testing.  While 2019 novel coronavirus  (SARS-CoV-2) nucleic acids may be present in the submitted sample  additional confirmatory testing may be necessary for epidemiological  and / or clinical management purposes  to differentiate between  SARS-CoV-2 and other Sarbecovirus currently known to infect humans.  If clinically indicated additional testing with an alternate test  methodology [phone]) is advised. The SARS-CoV-2 RNA is generally  detectable in upper and lower respiratory sp ecimens during the acute  phase of infection. The expected result is Negative. Fact Sheet for Patients:  StrictlyIdeas.no Fact Sheet for Healthcare Providers: BankingDealers.co.za This test is not yet approved or cleared by the [place] FDA and has been authorized for detection and/or diagnosis of SARS-CoV-2 by  FDA under an Emergency Use Authorization (EUA).  This EUA will remain in effect (meaning this test can be used) for the duration of the COVID-19 declaration under Section 564(b)(1) of the Act, 21 U.S.C. section 360bbb-3(b)(1), unless the authorization is terminated or revoked sooner. Performed at [facility], [address] [place], [place] [address]   MRSA PCR Screening     Status: None   Collection Time: 12/09/18 10:39 AM   Specimen: Nasal Mucosa; Nasopharyngeal  Result Value Ref Range Status   MRSA by PCR NEGATIVE NEGATIVE Final    Comment:        The GeneXpert MRSA Assay (FDA approved for NASAL specimens only), is one component of a comprehensive MRSA colonization surveillance program. It is not intended to diagnose MRSA infection nor to guide or monitor treatment for MRSA infections. Performed at [facility], [address] [place], [place] [address]      Radiology Studies: No results found.  Scheduled Meds: . Chlorhexidine Gluconate Cloth  6 each Topical Daily  . enoxaparin (LOVENOX) injection  40 mg Subcutaneous Q24H  . estradiol  0.1 mg Transdermal Weekly  . fluticasone  1 spray Each Nare Daily  . linaclotide  72 mcg Oral QAC breakfast  . loratadine  10 mg Oral Daily  . nystatin   Topical BID  . pantoprazole  40 mg Oral Daily   Continuous Infusions:    LOS: 6 days    Time spent: 30 minutes   Barton Dubois, MD [facility] Pager [phone]  12/14/2018, 3:10 PM

## 2018-12-14 NOTE — Plan of Care (Signed)
  Problem: Acute Rehab OT Goals (only OT should resolve) Goal: Pt. Will Perform Grooming Flowsheets (Taken 12/14/2018 0846) Pt Will Perform Grooming:  with set-up  sitting Goal: Pt. Will Perform Upper Body Dressing Flowsheets (Taken 12/14/2018 0846) Pt Will Perform Upper Body Dressing:  with supervision  sitting Goal: Pt. Will Perform Lower Body Dressing Flowsheets (Taken 12/14/2018 0846) Pt Will Perform Lower Body Dressing:  with supervision  bed level  sitting/lateral leans Goal: Pt. Will Transfer To Toilet Flowsheets (Taken 12/14/2018 [id number]) Pt Will Transfer to Toilet:  with min guard assist  with transfer board  with mod assist  squat pivot transfer Goal: Pt. Will Perform Toileting-Clothing Manipulation Flowsheets (Taken 12/14/2018 0846) Pt Will Perform Toileting - Clothing Manipulation and hygiene:  with min assist  sitting/lateral leans Goal: Pt/Caregiver Will Perform Home Exercise Program Flowsheets (Taken 12/14/2018 [phone]) Pt/caregiver will Perform Home Exercise Program:  Increased strength  Both right and left upper extremity  Independently  With written HEP provided

## 2018-12-14 NOTE — Evaluation (Signed)
Occupational Therapy Evaluation Patient Details Name: Paula Sutton MRN: [id number] DOB: 1955/11/21 Today's Date: 12/14/2018    History of Present Illness Paula Sutton is a 63 y.o. female with a history of GERD, dyslipidemia, arthritis, recent carpal tunnel release on the right upper extremity.  Patient seen for sudden onset of lower extremity weakness with paresthesias and cramping that started this morning around 6 AM.  She reports no new medications other than hydrocodone that she started yesterday after her carpal tunnel release.  Left lower extremity weakness is greater than right.  No palliating or provoking factors.  She has not had a flu or any other vaccine recently.  She denies diarrhea, nausea, vomiting, headache, chest pain, shortness of breath.  No rashes.  No loss of bladder or bowel function and reports no new injuries, trauma, or fall.   Clinical Impression   Pt agreeable to OT services this am, PT arrived shortly after beginning session. Pt reporting improved mobility of LEs, R>L, in sidelying position this am. In supine, pt able to use RLE to assist with scooting to [facility] with foot blocked. Pt is able to complete UB ADLs with set-up, occasional min guard due to decreased trunk control with lateral and posterior leans. Pt requiring increased assistance with LB ADLs and is unable to complete tasks in standing. Pt is 1 week s/p right carpal tunnel release, is currently using RUE for weightbearing and pulling herself up in bed without increased pain. Recommend CIR on discharge to improve pt safety and independence with ADL completion, as well as improving BUE strength required for transfers and mobility tasks. Pt is highly motivated to improve her functioning and independence and verbalizes being ready to go to rehab.     Follow Up Recommendations  CIR    Equipment Recommendations  None recommended by OT       Precautions / Restrictions Precautions Precautions:  Fall Restrictions Weight Bearing Restrictions: No      Mobility Bed Mobility Overal bed mobility: Needs Assistance Bed Mobility: Supine to Sit     Supine to sit: Min assist     General bed mobility comments: slow labored movement with fair/good return for propping up on elbows to hands, limited US of right hand due to carpal tunnel surgery  Transfers Overall transfer level: Needs assistance Equipment used: Sliding board Transfers: Lateral/Scoot Transfers          Lateral/Scoot Transfers: Min assist;Mod assist General transfer comment: unable to stand, required use of sliding board, demonstrates slow labored movement frequent rest breaks, able to use RLE some, but required knees blocked to avoid sliding forward during transfer to chair        ADL either performed or assessed with clinical judgement   ADL Overall ADL's : Needs assistance/impaired Eating/Feeding: Modified independent;Sitting   Grooming: Set up;Sitting   Upper Body Bathing: Minimal assistance;Sitting Upper Body Bathing Details (indicate cue type and reason): for reaching back and maintaining seated balance Lower Body Bathing: Minimal assistance;Sitting/lateral leans Lower Body Bathing Details (indicate cue type and reason): requiring UB support; assist for maintaining balance when leaning Upper Body Dressing : Min guard;Sitting   Lower Body Dressing: Total assistance;Bed level   Toilet Transfer: Minimal assistance;Moderate assistance;Transfer board;BSC;Cueing for sequencing(sliding board) Toilet Transfer Details (indicate cue type and reason): simulated with bed to chair transfer; cuing for hand placement, one rest break required during task           General ADL Comments: Pt motivated to participate and push herself  during tasks.      Vision Baseline Vision/History: No visual deficits Patient Visual Report: No change from baseline Vision Assessment?: No apparent visual deficits             Pertinent Vitals/Pain Pain Assessment: No/denies pain Pain Score: 5  Pain Location: bilateral hips Pain Descriptors / Indicators: Discomfort Pain Intervention(s): Repositioned     Hand Dominance Right   Extremity/Trunk Assessment Upper Extremity Assessment RUE Deficits / Details: grossly 4/5 to 4+/5. Wrist grossly 4-/5 (s/p carpal tunnel surgery on 12/07/2018) RUE Sensation: WNL RUE Coordination: WNL LUE Deficits / Details: grossly 4+/5 LUE Sensation: WNL LUE Coordination: WNL   Lower Extremity Assessment Lower Extremity Assessment: Defer to PT evaluation   Cervical / Trunk Assessment Cervical / Trunk Assessment: Normal   Communication Communication Communication: No difficulties   Cognition Arousal/Alertness: Awake/alert Behavior During Therapy: WFL for tasks assessed/performed Overall Cognitive Status: Within Functional Limits for tasks assessed                                                Home Living Family/patient expects to be discharged to:: Inpatient rehab Living Arrangements: Spouse/significant other Available Help at Discharge: Family;Available PRN/intermittently Type of Home: House Home Access: Stairs to enter [employer] of Steps: 1 Entrance Stairs-Rails: None Home Layout: One level     Bathroom Shower/Tub: Teacher, early years/pre: Standard     Home Equipment: Environmental consultant - 2 wheels;Cane - single point;Shower seat          Prior Functioning/Environment Level of Independence: Independent        Comments: Hydrographic surveyor, drives, independent in ADLs        OT Problem List: Decreased strength;Decreased activity tolerance;Impaired balance (sitting and/or standing);Decreased knowledge of use of DME or AE      OT Treatment/Interventions: Self-care/ADL training;Therapeutic exercise;Neuromuscular education;Therapeutic activities;Patient/family education    OT Goals(Current goals can be found in the  care plan section) Acute Rehab OT Goals Patient Stated Goal: return home after rehab OT Goal Formulation: With patient Time For Goal Achievement: 12/28/18 Potential to Achieve Goals: Good  OT Frequency: Min 2X/week           Co-evaluation PT/OT/SLP Co-Evaluation/Treatment: Yes Reason for Co-Treatment: Complexity of the patient's impairments (multi-system involvement);To address functional/ADL transfers   OT goals addressed during session: ADL's and self-care;Proper use of Adaptive equipment and DME         End of Session Equipment Utilized During Treatment: Other (comment)(slide board)  Activity Tolerance: Patient tolerated treatment well Patient left: in chair;with call bell/phone within reach  OT Visit Diagnosis: Muscle weakness (generalized) (M62.81);Other symptoms and signs involving the nervous system [id number])                Time: [id number] OT Time Calculation (min): 36 min Charges:  OT General Charges $OT Visit: 1 Visit OT Evaluation $OT Eval Moderate Complexity: [place], OTR/L  [phone] 12/14/2018, 8:40 AM

## 2018-12-14 NOTE — PMR Pre-admission (Signed)
PMR Admission Coordinator Pre-Admission Assessment  Patient: Paula Sutton is an 63 y.o., female MRN: [id number] DOB: 09/29/1955 Height: '5\' 3"'  (160 cm) Weight: 76.2 kg  Insurance Information HMO:     PPO: yes     PCP:      IPA:      80/20:      OTHER:  PRIMARY: UHC      Policy#: [id number]      Subscriber: Patient CM Name: Ivin Booty      Phone#: [phone]     Fax#: [phone] Pre-Cert#: [id number]      Employer:  [employer] provided by Ivin Booty on 10/2 for admit to CIR. Pt is approved for 7 days. Updates due on day 7 (10/2-10/8). Follow up CM is Princess Ugma (p): [phone] (f): [phone] Benefits:  Phone #: online     Name: [email or web address] Eff. Date: 03/15/2018-03/15/2019     Deduct: does not have in-network deductible      Out of Pocket Max: [phone] met)      Life Max: NA CIR: 80% coverage, 20% co-insurance      SNF: 80% coverage; limited to 150 days per cal yr, 20% co-insurance Outpatient: 80% coverage; limited to 49 OT visits (74 remaining), 40 PT visits (78 remaining), and 40 ST visits (40 remaining) *all therapies can be approved for additional visits based on medical necessity review    Co-Pay: 20% co-insurance Home Health: 80% coverage; limited by medical necessity      Co-Pay: 20% co-insurance DME: 80% coverage     Co-Pay: 20% co-insurance Providers:  SECONDARY: Medicare Part A      Policy#: [id number]      Subscriber: Patient CM Name:       Phone#:      Fax#:  Pre-Cert#:       Employer:  Benefits:  Phone #:      Name: verified eligibility via [place] on 12/14/2018 Eff. Date: Part A effective 04/15/2018     Deduct: $1,408      Out of Pocket Max: NA      Life Max: NA CIR:       SNF:  Outpatient:      Co-Pay:  Home Health:       Co-Pay:  DME:      Co-Pay:   Medicaid Application Date:       Case Manager:  Disability Application Date:       Case Worker:   The "Data Collection Information Summary" for patients in Inpatient Rehabilitation Facilities with  attached "Privacy Act [place] Records" was provided and verbally reviewed with: Patient  Emergency Contact Information Contact Information    Name Relation Home Work Mobile   Azizi,Stuart Spouse [phone]  [phone]      Current Medical History  Patient Admitting Diagnosis: 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 Pt presented to [facility] with sudden onset of lower extremity weakness, paraesthesias, and cramping. Pt's lower extremity weakness is thought to be secondary to Guillan-Barre Syndrome and pt was stated on 5 days of IVIG. Respiratory therapy has been following patient for forced vital capacity evaluations. Pt has required labetalol for blood pressure elevations. Pt has been evaluated by therapies with recommendation for CIR. Pt is to be admitted for a comprehensive rehab program on 12/15/2018.     Patient's medical record from [facility] has  been reviewed by the rehabilitation admission coordinator and physician.  Past Medical History  Past Medical History:  Diagnosis Date  . Arthritis    knee  . Dyslipidemia   . GERD (gastroesophageal reflux disease)   . Left anterior fascicular block    PATIENT SAYS THIS SHOWED ON EKG DURING COLONOSCOPY , WAS REFERRED TO CARDIOLOGY DR. Minus Breeding  (SEE EPIC ENCOUNTER 09-2017)       Family History   family history includes Alcohol abuse in her mother.  Prior Rehab/Hospitalizations Has the patient had prior rehab or hospitalizations prior to admission? No  Has the patient had major surgery during 100 days prior to admission? Yes   Current Medications  Current Facility-Administered Medications:  .  acetaminophen (TYLENOL) tablet 650 mg, 650 mg, Oral, Q4H PRN, Barton Dubois, MD, 650 mg at 12/15/18 0811 .  Chlorhexidine Gluconate Cloth 2  % PADS 6 each, 6 each, Topical, Daily, Heath Lark D, DO, 6 each at 12/12/18 1031 .  enoxaparin (LOVENOX) injection 40 mg, 40 mg, Subcutaneous, Q24H, [place], Kofi, MD, 40 mg at 12/15/18 0901 .  estradiol (CLIMARA - Dosed in mg/24 hr) patch 0.1 mg, 0.1 mg, Transdermal, Weekly, Truett Mainland, DO, Stopped at 12/08/18 2008 .  fluticasone (FLONASE) 50 MCG/ACT nasal spray 1 spray, 1 spray, Each Nare, Daily, Manuella Ghazi, Pratik D, DO, 1 spray at 12/15/18 0901 .  labetalol (NORMODYNE) injection 5 mg, 5 mg, Intravenous, Q2H PRN, Manuella Ghazi, Pratik D, DO .  linaclotide (LINZESS) capsule 72 mcg, 72 mcg, Oral, QAC breakfast, Heath Lark D, DO, 72 mcg at 12/15/18 0901 .  loratadine (CLARITIN) tablet 10 mg, 10 mg, Oral, Daily, Manuella Ghazi, Pratik D, DO, 10 mg at 12/15/18 0901 .  nystatin (MYCOSTATIN/NYSTOP) topical powder, , Topical, BID, Shah, Pratik D, DO .  ondansetron (ZOFRAN) tablet 4 mg, 4 mg, Oral, Q6H PRN **OR** ondansetron (ZOFRAN) injection 4 mg, 4 mg, Intravenous, Q6H PRN, Stinson, Jacob J, DO .  pantoprazole (PROTONIX) EC tablet 40 mg, 40 mg, Oral, Daily, Truett Mainland, DO, 40 mg at 12/15/18 0901 .  polyethylene glycol (MIRALAX / GLYCOLAX) packet 17 g, 17 g, Oral, Daily PRN, Truett Mainland, DO, 17 g at 12/08/18 2141 .  sodium phosphate (FLEET) 7-19 GM/118ML enema 1 enema, 1 enema, Rectal, Daily PRN, Manuella Ghazi, Pratik D, DO, 1 enema at 12/10/18 1506  Patients Current Diet:  Diet Order            Diet regular Room service appropriate? Yes; Fluid consistency: Thin  Diet effective now              Precautions / Restrictions Precautions Precautions: Fall Restrictions Weight Bearing Restrictions: No   Has the patient had 2 or more falls or a fall with injury in the past year? No  Prior Activity Level Community (5-7x/wk): not working, but drove PTA. Pt Independent PTA and requried no AD  Prior Functional Level Self Care: Did the patient need help bathing, dressing, using the toilet or eating?  Independent  Indoor Mobility: Did the patient need assistance with walking from room to room (with or without device)? Independent  Stairs: Did the patient need assistance with internal or external stairs (with or without device)? Independent  Functional Cognition: Did the patient need help planning regular tasks such as shopping or remembering to take medications? Independent  Home Assistive Devices / Equipment Home Assistive Devices/Equipment: None Home Equipment: Environmental consultant - 2 wheels, Cane - single point, Civil engineer, contracting  Prior Device Use: Indicate devices/aids used by  the patient prior to current illness, exacerbation or injury? None of the above  Current Functional Level Cognition  Overall Cognitive Status: Within Functional Limits for tasks assessed Orientation Level: Oriented X4    Extremity Assessment (includes Sensation/Coordination)  Upper Extremity Assessment: Generalized weakness, RUE deficits/detail, LUE deficits/detail RUE Deficits / Details: grossly 4/5 to 4+/5. Wrist grossly 4-/5 (s/p carpal tunnel surgery on 12/07/2018) RUE Sensation: WNL RUE Coordination: WNL LUE Deficits / Details: grossly 4+/5 LUE Sensation: WNL LUE Coordination: WNL  Lower Extremity Assessment: Defer to PT evaluation RLE Deficits / Details: grossly 2/5 RLE Sensation: WNL RLE Coordination: decreased gross motor LLE Deficits / Details: grossly 2/5 LLE Sensation: WNL LLE Coordination: decreased gross motor    ADLs  Overall ADL's : Needs assistance/impaired Eating/Feeding: Modified independent, Sitting Grooming: Set up, Sitting Upper Body Bathing: Minimal assistance, Sitting Upper Body Bathing Details (indicate cue type and reason): for reaching back and maintaining seated balance Lower Body Bathing: Minimal assistance, Sitting/lateral leans Lower Body Bathing Details (indicate cue type and reason): requiring UB support; assist for maintaining balance when leaning Upper Body Dressing : Min guard,  Sitting Lower Body Dressing: Total assistance, Bed level Toilet Transfer: Minimal assistance, Moderate assistance, Transfer board, BSC, Cueing for sequencing(sliding board) Toilet Transfer Details (indicate cue type and reason): simulated with bed to chair transfer; cuing for hand placement, one rest break required during task General ADL Comments: Pt motivated to participate and push herself during tasks.     Mobility  Overal bed mobility: Needs Assistance Bed Mobility: Supine to Sit Supine to sit: Min assist General bed mobility comments: slow labored movement with fair/good return for propping up on elbows to hands, limited US of right hand due to carpal tunnel surgery    Transfers  Overall transfer level: Needs assistance Equipment used: Sliding board Transfers: Lateral/Scoot Transfers  Lateral/Scoot Transfers: Min assist, Mod assist General transfer comment: unable to stand, required use of sliding board, demonstrates slow labored movement frequent rest breaks, able to use RLE some, but required knees blocked to avoid sliding forward during transfer to chair    Ambulation / Gait / Stairs / Office manager / Balance Dynamic Sitting Balance Sitting balance - Comments: fair static sitting balance, fair dynamic when leaning forward, poor when leaning backwards and left/right laterally Balance Overall balance assessment: Needs assistance Sitting-balance support: Feet supported, No upper extremity supported Sitting balance-Leahy Scale: Fair Sitting balance - Comments: fair static sitting balance, fair dynamic when leaning forward, poor when leaning backwards and left/right laterally Postural control: Posterior lean, Right lateral lean, Left lateral lean(see above)    Special needs/care consideration BiPAP/CPAP : no CPM : no Continuous Drip IV: no Dialysis : no        Days : no Life Vest : no Oxygen : on RA Special Bed : no Trach Size : no Wound Vac (area) : no       Location : no Skin: right wrist incision (carpal tunnel release? Prior to hospitalization)                   Bowel mgmt: continent, last BM: 12/15/2018, continent Bladder mgmt: continent Diabetic mgmt: no Behavioral consideration : no Chemo/radiation : no   Previous Home Environment (from acute therapy documentation) Living Arrangements: Spouse/significant other Available Help at Discharge: Family, Available PRN/intermittently Type of Home: House Home Layout: One level Home Access: Stairs to enter Entrance Stairs-Rails: None Entrance Stairs-Number of Steps: 1 Bathroom Shower/Tub: Tub/shower unit  Bathroom Toilet: Programmer, systems: Yes Home Care Services: No  Discharge Living Setting Plans for Discharge Living Setting: Patient's home, Lives with (comment)(spouse) Type of Home at Discharge: House Discharge Home Layout: One level Discharge Home Access: Stairs to enter Entrance Stairs-Rails: None Entrance Stairs-Number of Steps: 1 Discharge Bathroom Shower/Tub: Walk-in shower Discharge Bathroom Toilet: Standard Discharge Bathroom Accessibility: Yes How Accessible: Accessible via walker Does the patient have any problems obtaining your medications?: No  Social/Family/Support Systems Patient Roles: Spouse Contact Information: husband: [phone] Anticipated Caregiver: husband Anticipated Caregiver's Contact Information: see above Ability/Limitations of Caregiver: Min A Caregiver Availability: 24/7(can take time off work; lots of family support) Discharge Plan Discussed with Primary Caregiver: Yes Is Caregiver In Agreement with Plan?: Yes Does Caregiver/Family have Issues with Lodging/Transportation while Pt is in Rehab?: No  Goals/Additional Needs Patient/Family Goal for Rehab: PT/OT: Mod I/Supervision WC level; SLP: NA Expected length of stay: 10-14 days Cultural Considerations: [employer] Dietary Needs: regular diet, think liquids Equipment Needs:  TBD Pt/Family Agrees to Admission and willing to participate: Yes Program Orientation Provided & Reviewed with Pt/Caregiver Including Roles  & Responsibilities: Yes  Barriers to Discharge: Home environment access/layout  Barriers to Discharge Comments: 1 step to enter. bathroom may only be RW accessible   Decrease burden of Care through IP rehab admission: NA  Possible need for SNF placement upon discharge: Not anticipated; pt and family are prepares for pt to return home at wc level if necessary and are getting home accessible for wc use. Pt's husband states he can take time off work if need to be to there with his wife at DC.   Patient Condition: I have reviewed medical records from [facility], spoken with MD and RN, and patient and spouse. I discussed via phone for inpatient rehabilitation assessment.  Patient will benefit from ongoing PT and OT, can actively participate in 3 hours of therapy a day 5 days of the week, and can make measurable gains during the admission.  Patient will also benefit from the coordinated team approach during an Inpatient Acute Rehabilitation admission.  The patient will receive intensive therapy as well as Rehabilitation physician, nursing, social worker, and care management interventions.  Due to safety, skin/wound care, disease management, medication administration, pain management and patient education the patient requires 24 hour a day rehabilitation nursing.  The patient is currently Min/Mod A for lateral scoot transfers (no ambulation at this time) and Min A to Total A for basic ADLs.  Discharge setting and therapy post discharge at home with home health is anticipated.  Patient has agreed to participate in the Acute Inpatient Rehabilitation Program and will admit 12/15/2018.  Preadmission Screen Completed By:  Raechel Ache, 12/15/2018 10:01 AM ______________________________________________________________________   Discussed status with Dr. Naaman Plummer on  12/15/2018 at 10:01AM and received approval for admission today.  Admission Coordinator:  Raechel Ache, OT, time 10:01AM/Date 12/15/2018   Assessment/Plan: Diagnosis: GBS 1. Does the need for close, 24 hr/day Medical supervision in concert with the patient's rehab needs make it unreasonable for this patient to be served in a less intensive setting? Yes 2. Co-Morbidities requiring supervision/potential complications: Arthritis, GERD 3. Due to bladder management, bowel management, safety, skin/wound care, disease management, medication administration, pain management and patient education, does the patient require 24 hr/day rehab nursing? Yes 4. Does the patient require coordinated care of a physician, rehab nurse, PT (1-2 hrs/day, 5 days/week) and OT (1-2 hrs/day, 5 days/week) to address physical and functional deficits in the context of  the above medical diagnosis(es)? Yes Addressing deficits in the following areas: balance, endurance, locomotion, strength, transferring, bowel/bladder control, bathing, dressing, feeding, grooming, toileting and psychosocial support 5. Can the patient actively participate in an intensive therapy program of at least 3 hrs of therapy 5 days a week? Yes 6. The potential for patient to make measurable gains while on inpatient rehab is excellent 7. Anticipated functional outcomes upon discharge from inpatients are: modified independent and supervision PT, modified independent and supervision OT, n/a SLP---w/c level 8. Estimated rehab length of stay to reach the above functional goals is: 10-14 days 9. Anticipated D/C setting: Home 10. Anticipated post D/C treatments: [place] therapy 11. Overall Rehab/Functional Prognosis: excellent  MD Signature: Meredith Staggers, MD, [facility] 12/15/2018

## 2018-12-14 NOTE — TOC Progression Note (Signed)
Transition of Care [facility]) - Progression Note    Patient Details  Name: Paula Sutton MRN: [id number] Date of Birth: August 19, 1955  Transition of Care [facility]) CM/SW Contact  Ihor Gully, LCSW Phone Number: 12/14/2018, 2:22 PM  Clinical Narrative:    CIR is seeking authorization for patient         Expected Discharge Plan and Services       Post Acute Care Choice: [facility] Living arrangements for the past 2 months: Single Family Home                                       Social Determinants of Health (SDOH) Interventions    Readmission Risk Interventions No flowsheet data found.

## 2018-12-14 NOTE — Progress Notes (Signed)
Physical Therapy Treatment Patient Details Name: Paula Sutton MRN: [id number] DOB: Aug 04, 1955 Today's Date: 12/14/2018    History of Present Illness Paula Sutton is a 63 y.o. female with a history of GERD, dyslipidemia, arthritis, recent carpal tunnel release on the right upper extremity.  Patient seen for sudden onset of lower extremity weakness with paresthesias and cramping that started this morning around 6 AM.  She reports no new medications other than hydrocodone that she started yesterday after her carpal tunnel release.  Left lower extremity weakness is greater than right.  No palliating or provoking factors.  She has not had a flu or any other vaccine recently.  She denies diarrhea, nausea, vomiting, headache, chest pain, shortness of breath.  No rashes.  No loss of bladder or bowel function and reports no new injuries, trauma, or fall.    PT Comments    Patient agreeable and motivated for therapy.  Patient unable to move BLE against gravity, but demonstrates slightly increased strength in RLE and provided some resistance 2+/5 with hip/knee extensors when pushing right leg into flexion and able to use with foot blocked during bed mobility when moving up in bed.  Patient able to prop up on elbows to hands with labored movement with limited use of right hand due to s/p carpal tunnel surgery, continues to lose sitting balance when leaning backwards or left/right laterally, has to support self with arms once fatigued and demonstrates labored movement with frequent rest breaks when transferring over to chair using sliding board requiring occasional blocking of knees to avoid sliding forward.  Patient tolerated sitting up in chair after therapy - RN aware.  Patient will benefit from continued physical therapy in hospital and recommended venue below to increase strength, balance, endurance for safe ADLs and gait.   Follow Up Recommendations  CIR     Equipment Recommendations  Wheelchair  (measurements PT);Wheelchair cushion (measurements PT)    Recommendations for Other Services       Precautions / Restrictions Precautions Precautions: Fall Restrictions Weight Bearing Restrictions: No    Mobility  Bed Mobility Overal bed mobility: Needs Assistance Bed Mobility: Supine to Sit     Supine to sit: Min assist     General bed mobility comments: slow labored movement with fair/good return for propping up on elbows to hands, limited US of right hand due to carpal tunnel surgery  Transfers Overall transfer level: Needs assistance Equipment used: Sliding board Transfers: Lateral/Scoot Transfers          Lateral/Scoot Transfers: Min assist;Mod assist General transfer comment: unable to stand, required use of sliding board, demonstrates slow labored movement frequent rest breaks, able to use RLE some, but required knees blocked to avoid sliding forward during transfer to chair  Ambulation/Gait                 Stairs             Wheelchair Mobility    Modified Rankin (Stroke Patients Only)       Balance Overall balance assessment: Needs assistance Sitting-balance support: Feet supported;No upper extremity supported Sitting balance-Leahy Scale: Fair Sitting balance - Comments: fair static sitting balance, fair dynamic when leaning forward, poor when leaning backwards and left/right laterally Postural control: Posterior lean;Right lateral lean;Left lateral lean(see above)                                  Cognition Arousal/Alertness:  Awake/alert Behavior During Therapy: WFL for tasks assessed/performed Overall Cognitive Status: Within Functional Limits for tasks assessed                                        Exercises      General Comments        Pertinent Vitals/Pain Pain Assessment: No/denies pain Pain Score: 5  Pain Location: bilateral hips Pain Descriptors / Indicators: Discomfort Pain  Intervention(s): Repositioned    Home Living Family/patient expects to be discharged to:: Inpatient rehab Living Arrangements: Spouse/significant other Available Help at Discharge: Family;Available PRN/intermittently Type of Home: House Home Access: Stairs to enter Entrance Stairs-Rails: None Home Layout: One level Home Equipment: Environmental consultant - 2 wheels;Cane - single point;Shower seat      Prior Function Level of Independence: Independent      Comments: Hydrographic surveyor, drives, independent in ADLs   PT Goals (current goals can now be found in the care plan section) Acute Rehab PT Goals Patient Stated Goal: return home after rehab PT Goal Formulation: With patient Time For Goal Achievement: 12/26/18 Potential to Achieve Goals: Good Progress towards PT goals: Progressing toward goals    Frequency    Min 3X/week      PT Plan Current plan remains appropriate    Co-evaluation              AM-PAC PT "6 Clicks" Mobility   Outcome Measure  Help needed turning from your back to your side while in a flat bed without using bedrails?: A Little Help needed moving from lying on your back to sitting on the side of a flat bed without using bedrails?: A Lot Help needed moving to and from a bed to a chair (including a wheelchair)?: A Lot Help needed standing up from a chair using your arms (e.g., wheelchair or bedside chair)?: Total Help needed to walk in hospital room?: Total Help needed climbing 3-5 steps with a railing? : Total 6 Click Score: 10    End of Session   Activity Tolerance: Patient tolerated treatment well;Patient limited by fatigue Patient left: in chair;with call bell/phone within reach Nurse Communication: Mobility status PT Visit Diagnosis: Unsteadiness on feet (R26.81);Other abnormalities of gait and mobility (R26.89);Muscle weakness (generalized) (M62.81)     Time: [id number] PT Time Calculation (min) (ACUTE ONLY): 24 min  Charges:  $Therapeutic  Activity: 23-37 mins                     8:52 AM, 12/14/18 Lonell Grandchild, MPT Physical Therapist with [facility] [phone] office [phone] mobile phone

## 2018-12-14 NOTE — Progress Notes (Signed)
Inpatient Rehab Admissions:  Inpatient Rehab Consult received.  I spoke with pt via phone for her rehabilitation assessment and to discuss goals and expectations of an inpatient rehab admission.  Feel pt is a great candidate for CIR and pt would like to pursue IP Rehab at this time. I will proceed with initiating insurance authorization process for possible admit and will follow up once there has been a determination.   Please call if questions.   Jhonnie Garner, OTR/L  Rehab Admissions Coordinator  [phone] 12/14/2018 11:15 AM

## 2018-12-14 NOTE — Progress Notes (Signed)
NIF -80, VC 2L with great effort

## 2018-12-14 NOTE — Progress Notes (Signed)
Patient performed -80 NIF and 1.75L FVC

## 2018-12-15 ENCOUNTER — Encounter (HOSPITAL_COMMUNITY): Payer: Self-pay | Admitting: Nurse Practitioner

## 2018-12-15 ENCOUNTER — Inpatient Hospital Stay (HOSPITAL_COMMUNITY)
Admission: RE | Admit: 2018-12-15 | Discharge: 2018-12-27 | DRG: 074 | Disposition: A | Discharge status: Home Health | Payer: 59 | Source: Intra-hospital | Attending: Physical Medicine and Rehabilitation | Admitting: Physical Medicine and Rehabilitation

## 2018-12-15 ENCOUNTER — Other Ambulatory Visit: Payer: Self-pay

## 2018-12-15 DIAGNOSIS — E785 Hyperlipidemia, unspecified: Secondary | ICD-10-CM | POA: Diagnosis present

## 2018-12-15 DIAGNOSIS — G650 Sequelae of Guillain-Barre syndrome: Principal | ICD-10-CM | POA: Diagnosis present

## 2018-12-15 DIAGNOSIS — I444 Left anterior fascicular block: Secondary | ICD-10-CM | POA: Diagnosis present

## 2018-12-15 DIAGNOSIS — D72829 Elevated white blood cell count, unspecified: Secondary | ICD-10-CM | POA: Diagnosis present

## 2018-12-15 DIAGNOSIS — G610 Guillain-Barre syndrome: Secondary | ICD-10-CM | POA: Diagnosis present

## 2018-12-15 DIAGNOSIS — K5909 Other constipation: Secondary | ICD-10-CM | POA: Diagnosis present

## 2018-12-15 DIAGNOSIS — Z882 Allergy status to sulfonamides status: Secondary | ICD-10-CM

## 2018-12-15 DIAGNOSIS — R531 Weakness: Secondary | ICD-10-CM | POA: Diagnosis present

## 2018-12-15 DIAGNOSIS — Z87891 Personal history of nicotine dependence: Secondary | ICD-10-CM

## 2018-12-15 DIAGNOSIS — G8220 Paraplegia, unspecified: Secondary | ICD-10-CM

## 2018-12-15 DIAGNOSIS — E871 Hypo-osmolality and hyponatremia: Secondary | ICD-10-CM | POA: Diagnosis present

## 2018-12-15 DIAGNOSIS — M7918 Myalgia, other site: Secondary | ICD-10-CM | POA: Diagnosis present

## 2018-12-15 DIAGNOSIS — Z811 Family history of alcohol abuse and dependence: Secondary | ICD-10-CM | POA: Diagnosis not present

## 2018-12-15 DIAGNOSIS — K219 Gastro-esophageal reflux disease without esophagitis: Secondary | ICD-10-CM | POA: Diagnosis present

## 2018-12-15 DIAGNOSIS — G5600 Carpal tunnel syndrome, unspecified upper limb: Secondary | ICD-10-CM | POA: Diagnosis present

## 2018-12-15 LAB — GLUCOSE, CAPILLARY
Glucose-Capillary: 119 mg/dL — ABNORMAL HIGH (ref 70–99)
Glucose-Capillary: 107 mg/dL — ABNORMAL HIGH (ref 70–99)

## 2018-12-15 MED ORDER — PANTOPRAZOLE SODIUM 40 MG PO TBEC
40.0000 mg | DELAYED_RELEASE_TABLET | Freq: Every day | ORAL | Status: DC
  Administered 2018-12-16 – 2018-12-27 (×12): 40 mg via ORAL
  Filled 2018-12-15 (×12): qty 1

## 2018-12-15 MED ORDER — TRAMADOL HCL 50 MG PO TABS
50.0000 mg | ORAL_TABLET | Freq: Four times a day (QID) | ORAL | Status: DC | PRN
  Administered 2018-12-15 – 2018-12-27 (×26): 50 mg via ORAL
  Filled 2018-12-15 (×28): qty 1

## 2018-12-15 MED ORDER — ONDANSETRON HCL 4 MG PO TABS: 4.0000 mg | ORAL_TABLET | Freq: Four times a day (QID) | ORAL | 0 refills | Status: DC | PRN

## 2018-12-15 MED ORDER — DIPHENHYDRAMINE HCL 12.5 MG/5ML PO ELIX: 12.5000 mg | ORAL_SOLUTION | Freq: Four times a day (QID) | ORAL | Status: DC | PRN

## 2018-12-15 MED ORDER — ENOXAPARIN SODIUM 40 MG/0.4ML ~~LOC~~ SOLN
40.0000 mg | SUBCUTANEOUS | Status: DC
  Administered 2018-12-16 – 2018-12-27 (×12): 40 mg via SUBCUTANEOUS
  Filled 2018-12-15 (×14): qty 0.4

## 2018-12-15 MED ORDER — LINACLOTIDE 72 MCG PO CAPS
72.0000 ug | ORAL_CAPSULE | Freq: Every day | ORAL | Status: DC
  Administered 2018-12-16 – 2018-12-27 (×12): 72 ug via ORAL
  Filled 2018-12-15 (×12): qty 1

## 2018-12-15 MED ORDER — ACETAMINOPHEN 325 MG PO TABS
325.0000 mg | ORAL_TABLET | ORAL | Status: DC | PRN
  Administered 2018-12-19 – 2018-12-27 (×19): 650 mg via ORAL
  Filled 2018-12-15 (×12): qty 2
  Filled 2018-12-15: qty 1
  Filled 2018-12-15: qty 2
  Filled 2018-12-15: qty 1
  Filled 2018-12-15 (×4): qty 2

## 2018-12-15 MED ORDER — PROCHLORPERAZINE MALEATE 5 MG PO TABS: 5.0000 mg | ORAL_TABLET | Freq: Four times a day (QID) | ORAL | Status: DC | PRN

## 2018-12-15 MED ORDER — LINACLOTIDE 145 MCG PO CAPS: 145.0000 ug | ORAL_CAPSULE | ORAL | Status: DC | PRN

## 2018-12-15 MED ORDER — NYSTATIN 100000 UNIT/GM EX POWD: Freq: Two times a day (BID) | CUTANEOUS | 0 refills | Status: DC

## 2018-12-15 MED ORDER — ALUM & MAG HYDROXIDE-SIMETH 200-200-20 MG/5ML PO SUSP: 30.0000 mL | ORAL | Status: DC | PRN

## 2018-12-15 MED ORDER — NYSTATIN 100000 UNIT/GM EX POWD
Freq: Two times a day (BID) | CUTANEOUS | Status: DC
  Administered 2018-12-15 – 2018-12-23 (×8): via TOPICAL
  Filled 2018-12-15: qty 15

## 2018-12-15 MED ORDER — POLYETHYLENE GLYCOL 3350 17 G PO PACK
17.0000 g | PACK | Freq: Every day | ORAL | Status: DC | PRN
  Administered 2018-12-17 – 2018-12-24 (×3): 17 g via ORAL
  Filled 2018-12-15 (×2): qty 1

## 2018-12-15 MED ORDER — PROCHLORPERAZINE 25 MG RE SUPP: 12.5000 mg | Freq: Four times a day (QID) | RECTAL | Status: DC | PRN

## 2018-12-15 MED ORDER — ENOXAPARIN SODIUM 40 MG/0.4ML ~~LOC~~ SOLN: 40.0000 mg | SUBCUTANEOUS | Status: DC

## 2018-12-15 MED ORDER — ACETAMINOPHEN 325 MG PO TABS: 650.0000 mg | ORAL_TABLET | ORAL | Status: DC | PRN

## 2018-12-15 MED ORDER — GUAIFENESIN-DM 100-10 MG/5ML PO SYRP: 5.0000 mL | ORAL_SOLUTION | Freq: Four times a day (QID) | ORAL | Status: DC | PRN

## 2018-12-15 MED ORDER — LORATADINE 10 MG PO TABS
10.0000 mg | ORAL_TABLET | Freq: Every day | ORAL | Status: DC
  Administered 2018-12-16 – 2018-12-27 (×12): 10 mg via ORAL
  Filled 2018-12-15 (×12): qty 1

## 2018-12-15 MED ORDER — PROCHLORPERAZINE EDISYLATE 10 MG/2ML IJ SOLN: 5.0000 mg | Freq: Four times a day (QID) | INTRAMUSCULAR | Status: DC | PRN

## 2018-12-15 MED ORDER — TRAZODONE HCL 50 MG PO TABS
25.0000 mg | ORAL_TABLET | Freq: Every evening | ORAL | Status: DC | PRN
  Administered 2018-12-21: 50 mg via ORAL
  Administered 2018-12-22: 25 mg via ORAL
  Filled 2018-12-15 (×3): qty 1

## 2018-12-15 MED ORDER — FLUTICASONE PROPIONATE 50 MCG/ACT NA SUSP
1.0000 | Freq: Every day | NASAL | Status: DC
  Administered 2018-12-16 – 2018-12-23 (×6): 1 via NASAL
  Filled 2018-12-15: qty 16

## 2018-12-15 MED ORDER — POLYETHYLENE GLYCOL 3350 17 G PO PACK: 17.0000 g | PACK | Freq: Every day | ORAL | Status: DC | PRN

## 2018-12-15 MED ORDER — FLEET ENEMA 7-19 GM/118ML RE ENEM: 1.0000 | ENEMA | Freq: Once | RECTAL | Status: DC | PRN

## 2018-12-15 MED ORDER — BISACODYL 10 MG RE SUPP: 10.0000 mg | Freq: Every day | RECTAL | Status: DC | PRN

## 2018-12-15 MED ORDER — ACETAMINOPHEN 325 MG PO TABS
650.0000 mg | ORAL_TABLET | ORAL | Status: DC | PRN
  Administered 2018-12-15: 650 mg via ORAL
  Filled 2018-12-15: qty 2

## 2018-12-15 MED ORDER — ESTRADIOL 0.1 MG/24HR TD PTWK
0.1000 mg | MEDICATED_PATCH | TRANSDERMAL | Status: DC
  Administered 2018-12-15: 0.1 mg via TRANSDERMAL
  Filled 2018-12-15 (×2): qty 1

## 2018-12-15 NOTE — Plan of Care (Signed)
  Problem: Education: Goal: Knowledge of General Education information will improve Description: Including pain rating scale, medication(s)/side effects and non-pharmacologic comfort measures Outcome: Adequate for Discharge   Problem: Health Behavior/Discharge Planning: Goal: Ability to manage health-related needs will improve Outcome: Adequate for Discharge   Problem: Clinical Measurements: Goal: Ability to maintain clinical measurements within normal limits will improve Outcome: Adequate for Discharge Goal: Diagnostic test results will improve Outcome: Adequate for Discharge   Problem: Activity: Goal: Risk for activity intolerance will decrease Outcome: Adequate for Discharge   Problem: Nutrition: Goal: Adequate nutrition will be maintained Outcome: Adequate for Discharge   Problem: Coping: Goal: Level of anxiety will decrease Outcome: Adequate for Discharge   Problem: Elimination: Goal: Will not experience complications related to bowel motility Outcome: Adequate for Discharge Goal: Will not experience complications related to urinary retention Outcome: Adequate for Discharge   Problem: Pain Managment: Goal: General experience of comfort will improve Outcome: Adequate for Discharge   Problem: Safety: Goal: Ability to remain free from injury will improve Outcome: Adequate for Discharge   Problem: Skin Integrity: Goal: Risk for impaired skin integrity will decrease Outcome: Adequate for Discharge

## 2018-12-15 NOTE — Progress Notes (Signed)
Orthopedic Tech Progress Note Patient Details:  Paula Sutton 1955-10-22 [id number] Called in order to [facility] for a WRIST SPLINT PA said it was for "[place]" Patient ID: [name], female   DOB: 04-18-1955, 63 y.o.   MRN: [id number]   [name] 12/15/2018, 2:27 PM

## 2018-12-15 NOTE — H&P (Signed)
Physical Medicine and Rehabilitation Admission H&P    Chief Complaint  Patient presents with  . 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 She was admitted to [facility] for work up and MRI spine showed mild DDD/facet disease lumbar spine, moderate right neural foraminal stenosis C7-T1 and diffuse cervical spondylosis with right foraminal stenosis C3/4 and bilateral foraminal stenosis C4/5. She was found to have leukocytosis and LP done revealing elevated protein and elevated glucose--no oligoclonal bands seen. CSF culture without organism and no growth.. She was evaluated by Dr. 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  She has had some recovery in RLE but continues to have paraplegia requiring knees blocked and frequent rest breaks but respiratory status stable. CIR recommended due to functional decline.    Review of Systems  Constitutional: Negative for chills and fever.  HENT: Negative for tinnitus.   Eyes: Negative for blurred vision and double vision.  Respiratory: Negative for cough and shortness of breath.   Cardiovascular: Negative for chest pain and palpitations.  Gastrointestinal: Negative for diarrhea, heartburn and nausea.  Genitourinary: Negative for dysuria and urgency.  Musculoskeletal: Positive for back pain (due to being in bed).  Skin: Negative for rash.  Neurological: Positive for sensory change (burning in feet and sensory changes LLE>RLE ) and weakness. Negative for dizziness and headaches.  Psychiatric/Behavioral: Negative for memory loss. The patient does not have insomnia.      Past Medical History:  Diagnosis Date  . Arthritis    knee  . Dyslipidemia   . GERD (gastroesophageal reflux disease)   . Left anterior  fascicular block    PATIENT SAYS THIS SHOWED ON EKG DURING COLONOSCOPY , WAS REFERRED TO CARDIOLOGY DR. Minus Breeding  (SEE EPIC ENCOUNTER 09-2017)       Past Surgical History:  Procedure Laterality Date  . ABDOMINAL HYSTERECTOMY    . BIOPSY  08/09/2017   Procedure: BIOPSY;  Surgeon: Danie Binder, MD;  [facility];  Service: Endoscopy;;  gastric biopsy for h pylori  . CARPAL TUNNEL RELEASE Right 12/07/2018  . CATARACT EXTRACTION, BILATERAL     DID 1 YEAR APART , LAST ONE WAS 12-2017  . COLONOSCOPY WITH ESOPHAGOGASTRODUODENOSCOPY (EGD)  2014   [place] PROPOFOL N/A 08/09/2017   3 simple adenomas, diverticulosis in recto-sigmoid, sigmoid, and descending colin, moderate external and internal hemorrhoids  . ESOPHAGOGASTRODUODENOSCOPY (EGD) WITH PROPOFOL N/A 08/09/2017   Low-grade narrowing Schatzki ring due to GERD; small hiatal hernia; mild gastritis and duodenitis due to NSAIDs; biopsy with gastritis  . KNEE ARTHROSCOPY Right 11/2017   WITH DR Wynelle Link  AT [place] Right   . POLYPECTOMY  08/09/2017   Procedure: POLYPECTOMY;  Surgeon: Danie Binder, MD;  [facility];  Service: Endoscopy;;  cecal polyp hs, transverse colon polyp hs, rectal polyp cs  . TOTAL KNEE REVISION Right 03/01/2018   Procedure: RIGHT TOTAL KNEE REVISION;  Surgeon: Gaynelle Arabian, MD;  Location: [facility];  Service: Orthopedics;  Laterality: Right;  131min   Family History  Problem Relation Age of Onset  . Alcohol abuse Mother   . Colon cancer Neg Hx   .  Breast cancer Neg Hx     Social History:  Married. 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 She reports that she quit smoking about 47 years ago. Her smoking use included cigarettes. She has never used smokeless tobacco. She reports current alcohol use. She reports current drug use. Frequency: 7.00 times per week. Drug: Marijuana.    Allergies  Allergen Reactions  . Sulfa Antibiotics  Anaphylaxis and Other (See Comments)    Tongue swelling     Medications Prior to Admission  Medication Sig Dispense Refill  . CALCIUM PO Take 1 tablet by mouth 2 (two) times daily.     . Cholecalciferol (VITAMIN D3) 125 MCG (5000 UT) CAPS Take 5,000 Units by mouth 2 (two) times daily.    Marland Kitchen estradiol (CLIMARA - DOSED IN MG/24 HR) 0.1 mg/24hr patch Place 0.1 mg onto the skin once a week.     . lansoprazole (PREVACID) 30 MG capsule 1 po 30 mins prior to first meal AND LAST MEAL 60 capsule 11  . linaclotide (LINZESS) 145 MCG CAPS capsule Take 1 capsule (145 mcg total) by mouth daily before breakfast. (Patient taking differently: Take 145 mcg by mouth as needed. ) 90 capsule 3  . OVER THE COUNTER MEDICATION Take 1-2 capsules by mouth See admin instructions. Omega 3/Red Yeast Rice Take 1 capsule by mouth in the morning and take 2 capsules by mouth at bedtime    . polyethylene glycol (MIRALAX / GLYCOLAX) 17 g packet Take 17 g by mouth daily as needed.    . Vitamin D-Vitamin K (K2 PLUS D3 PO) Take by mouth daily.      Drug Regimen Review  Drug regimen was reviewed and remains appropriate with no significant issues identified  Home: Home Living Family/patient expects to be discharged to:: Inpatient rehab Living Arrangements: Spouse/significant other Available Help at Discharge: Family, Available PRN/intermittently Type of Home: House Home Access: Stairs to enter [employer] of Steps: 1 Entrance Stairs-Rails: None Home Layout: One level Bathroom Shower/Tub: Chiropodist: Standard Bathroom Accessibility: Yes Home Equipment: Environmental consultant - 2 wheels, Cane - single point, Industrial/product designer History: Prior Function Level of Independence: Independent Comments: Hydrographic surveyor, drives, independent in ADLs  Functional Status:  Mobility: Bed Mobility Overal bed mobility: Needs Assistance Bed Mobility: Supine to Sit Supine to sit: Min assist General bed  mobility comments: slow labored movement with fair/good return for propping up on elbows to hands, limited US of right hand due to carpal tunnel surgery Transfers Overall transfer level: Needs assistance Equipment used: Sliding board Transfers: Lateral/Scoot Transfers  Lateral/Scoot Transfers: Min assist, Mod assist General transfer comment: unable to stand, required use of sliding board, demonstrates slow labored movement frequent rest breaks, able to use RLE some, but required knees blocked to avoid sliding forward during transfer to chair      ADL: ADL Overall ADL's : Needs assistance/impaired Eating/Feeding: Modified independent, Sitting Grooming: Set up, Sitting Upper Body Bathing: Minimal assistance, Sitting Upper Body Bathing Details (indicate cue type and reason): for reaching back and maintaining seated balance Lower Body Bathing: Minimal assistance, Sitting/lateral leans Lower Body Bathing Details (indicate cue type and reason): requiring UB support; assist for maintaining balance when leaning Upper Body Dressing : Min guard, Sitting Lower Body Dressing: Total assistance, Bed level Toilet Transfer: Minimal assistance, Moderate assistance, Transfer board, BSC, Cueing for sequencing(sliding board) Toilet Transfer Details (indicate cue type and reason): simulated with bed to chair transfer; cuing for hand placement, one rest break required during  task General ADL Comments: Pt motivated to participate and push herself during tasks.   Cognition: Cognition Overall Cognitive Status: Within Functional Limits for tasks assessed Orientation Level: Oriented X4 Cognition Arousal/Alertness: Awake/alert Behavior During Therapy: WFL for tasks assessed/performed Overall Cognitive Status: Within Functional Limits for tasks assessed   Blood pressure (!) 152/88, pulse (!) 55, temperature 98 F (36.7 C), temperature source Oral, resp. rate 16, height 5\' 3"  (1.6 m), weight 76.2 kg, SpO2 95  %. Physical Exam  Nursing note and vitals reviewed. Constitutional: She is oriented to person, place, and time. She appears well-developed and well-nourished. No distress.  HENT:  Head: Normocephalic and atraumatic.  Eyes: Pupils are equal, round, and reactive to light. EOM are normal.  Neck: Normal range of motion. No tracheal deviation present. No thyromegaly present.  Cardiovascular: Normal rate and regular rhythm. Exam reveals no friction rub.  No murmur heard. Respiratory: Effort normal. No respiratory distress. She has no wheezes.  GI: Soft. She exhibits no distension. There is no abdominal tenderness.  Musculoskeletal:        General: No edema.     Comments: Right palm with sutures intact. Minimal tenderness to palpation.   Neurological: She is alert and oriented to person, place, and time.  UE 5/5 except for right wrist/grip which is limited by recent surgery. RLE: 1+ HF, KE and 3+/5 ADF/PF. LLE: tr HF, KE and 2+/5 ADF/PF. Decreased LT below the upper thigh prox to distal. Normal sensation in UE's less sl decrease in the right carpal tunnel distribution.  DTR's absent.   Skin: Skin is warm and dry. She is not diaphoretic.  Psychiatric: She has a normal mood and affect. Her behavior is normal. Judgment and thought content normal.    No results found for this or any previous visit (from the past 48 hour(s)). No results found.     Medical Problem List and Plan: 1.  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 Axial scans reviewed which do not show any signs of myelopathy.  -monitor for progress in therapies. Consider f/u MRI of lumbar spine and neurology re-consult 2.  Antithrombotics: -DVT/anticoagulation:  Pharmaceutical: Lovenox  -antiplatelet therapy: N/A 3. Pain Management: Ultram prn 4. Mood: LCSW to follow for evaluation and support.   -antipsychotic agents: N/A 5. Neuropsych:  This patient is capable of making decisions on her own behalf. 6. Skin/Wound Care: Routine pressure relief measures.  7. Fluids/Electrolytes/Nutrition: Monitor I/O. Check lytes in am.  8. GERD: Continue PPI 9. Right carpel tunnel release: Will order splint given increased need to utilize UE's for transfer, etc. Sutures to come out Monday? 10. Bowel and bladder:  -appears to be continent  -monitor for patterns      Bary Leriche, PA-C 12/15/2018

## 2018-12-15 NOTE — H&P (Signed)
Physical Medicine and Rehabilitation Admission H&P         Chief Complaint  Patient presents with  . 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 She was admitted to [facility] for work up and MRI spine showed mild DDD/facet disease lumbar spine, moderate right neural foraminal stenosis C7-T1 and diffuse cervical spondylosis with right foraminal stenosis C3/4 and bilateral foraminal stenosis C4/5. She was found to have leukocytosis and LP done revealing elevated protein and elevated glucose--no oligoclonal bands seen. CSF culture without organism and no growth.. She was evaluated by Dr. 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  She has had some recovery in RLE but continues to have paraplegia requiring knees blocked and frequent rest breaks but respiratory status stable. CIR recommended due to functional decline.      Review of Systems  Constitutional: Negative for chills and fever.  HENT: Negative for tinnitus.   Eyes: Negative for blurred vision and double vision.  Respiratory: Negative for cough and shortness of breath.   Cardiovascular: Negative for chest pain and palpitations.  Gastrointestinal: Negative for diarrhea, heartburn and nausea.  Genitourinary: Negative for dysuria and urgency.  Musculoskeletal: Positive for back pain (due to being in bed).  Skin: Negative for rash.  Neurological: Positive for sensory change (burning in feet and sensory changes LLE>RLE ) and weakness. Negative for dizziness and headaches.  Psychiatric/Behavioral: Negative for memory loss. The patient does not have insomnia.           Past Medical History:  Diagnosis Date  . Arthritis      knee  . Dyslipidemia    . GERD (gastroesophageal reflux  disease)    . Left anterior fascicular block      PATIENT SAYS THIS SHOWED ON EKG DURING COLONOSCOPY , WAS REFERRED TO CARDIOLOGY DR. Minus Breeding  (SEE EPIC ENCOUNTER 09-2017)             Past Surgical History:  Procedure Laterality Date  . ABDOMINAL HYSTERECTOMY      . BIOPSY   08/09/2017    Procedure: BIOPSY;  Surgeon: Danie Binder, MD;  [facility];  Service: Endoscopy;;  gastric biopsy for h pylori  . CARPAL TUNNEL RELEASE Right 12/07/2018  . CATARACT EXTRACTION, BILATERAL        DID 1 YEAR APART , LAST ONE WAS 12-2017  . COLONOSCOPY WITH ESOPHAGOGASTRODUODENOSCOPY (EGD)   2014    [place] PROPOFOL N/A 08/09/2017    3 simple adenomas, diverticulosis in recto-sigmoid, sigmoid, and descending colin, moderate external and internal hemorrhoids  . ESOPHAGOGASTRODUODENOSCOPY (EGD) WITH PROPOFOL N/A 08/09/2017    Low-grade narrowing Schatzki ring due to GERD; small hiatal hernia; mild gastritis and duodenitis due to NSAIDs; biopsy with gastritis  . KNEE ARTHROSCOPY Right 11/2017    WITH DR Wynelle Link  AT [place] Right    . POLYPECTOMY   08/09/2017    Procedure: POLYPECTOMY;  Surgeon: Danie Binder, MD;  [facility];  Service: Endoscopy;;  cecal polyp hs, transverse colon polyp hs, rectal polyp cs  . TOTAL KNEE REVISION Right 03/01/2018  Procedure: RIGHT TOTAL KNEE REVISION;  Surgeon: Gaynelle Arabian, MD;  Location: [facility];  Service: Orthopedics;  Laterality: Right;  179min        Family History  Problem Relation Age of Onset  . Alcohol abuse Mother    . Colon cancer Neg Hx    . Breast cancer Neg Hx       Social History:  Married. 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 She reports that she quit smoking about 47 years ago. Her smoking use included cigarettes. She has never used smokeless tobacco. She reports current alcohol use. She reports current drug use. Frequency: 7.00 times per week. Drug:  Marijuana.           Allergies  Allergen Reactions  . Sulfa Antibiotics Anaphylaxis and Other (See Comments)      Tongue swelling            Medications Prior to Admission  Medication Sig Dispense Refill  . CALCIUM PO Take 1 tablet by mouth 2 (two) times daily.       . Cholecalciferol (VITAMIN D3) 125 MCG (5000 UT) CAPS Take 5,000 Units by mouth 2 (two) times daily.      Marland Kitchen estradiol (CLIMARA - DOSED IN MG/24 HR) 0.1 mg/24hr patch Place 0.1 mg onto the skin once a week.       . lansoprazole (PREVACID) 30 MG capsule 1 po 30 mins prior to first meal AND LAST MEAL 60 capsule 11  . linaclotide (LINZESS) 145 MCG CAPS capsule Take 1 capsule (145 mcg total) by mouth daily before breakfast. (Patient taking differently: Take 145 mcg by mouth as needed. ) 90 capsule 3  . OVER THE COUNTER MEDICATION Take 1-2 capsules by mouth See admin instructions. Omega 3/Red Yeast Rice Take 1 capsule by mouth in the morning and take 2 capsules by mouth at bedtime      . polyethylene glycol (MIRALAX / GLYCOLAX) 17 g packet Take 17 g by mouth daily as needed.      . Vitamin D-Vitamin K (K2 PLUS D3 PO) Take by mouth daily.         Drug Regimen Review  Drug regimen was reviewed and remains appropriate with no significant issues identified   Home: Home Living Family/patient expects to be discharged to:: Inpatient rehab Living Arrangements: Spouse/significant other Available Help at Discharge: Family, Available PRN/intermittently Type of Home: House Home Access: Stairs to enter [employer] of Steps: 1 Entrance Stairs-Rails: None Home Layout: One level Bathroom Shower/Tub: Chiropodist: Standard Bathroom Accessibility: Yes Home Equipment: Environmental consultant - 2 wheels, Cane - single point, Industrial/product designer History: Prior Function Level of Independence: Independent Comments: Hydrographic surveyor, drives, independent in ADLs   Functional Status:  Mobility: Bed Mobility Overal  bed mobility: Needs Assistance Bed Mobility: Supine to Sit Supine to sit: Min assist General bed mobility comments: slow labored movement with fair/good return for propping up on elbows to hands, limited US of right hand due to carpal tunnel surgery Transfers Overall transfer level: Needs assistance Equipment used: Sliding board Transfers: Lateral/Scoot Transfers  Lateral/Scoot Transfers: Min assist, Mod assist General transfer comment: unable to stand, required use of sliding board, demonstrates slow labored movement frequent rest breaks, able to use RLE some, but required knees blocked to avoid sliding forward during transfer to chair       ADL: ADL Overall ADL's : Needs assistance/impaired Eating/Feeding: Modified independent, Sitting Grooming: Set up, Sitting Upper Body Bathing: Minimal assistance, Sitting Upper Body Bathing  Details (indicate cue type and reason): for reaching back and maintaining seated balance Lower Body Bathing: Minimal assistance, Sitting/lateral leans Lower Body Bathing Details (indicate cue type and reason): requiring UB support; assist for maintaining balance when leaning Upper Body Dressing : Min guard, Sitting Lower Body Dressing: Total assistance, Bed level Toilet Transfer: Minimal assistance, Moderate assistance, Transfer board, BSC, Cueing for sequencing(sliding board) Toilet Transfer Details (indicate cue type and reason): simulated with bed to chair transfer; cuing for hand placement, one rest break required during task General ADL Comments: Pt motivated to participate and push herself during tasks.    Cognition: Cognition Overall Cognitive Status: Within Functional Limits for tasks assessed Orientation Level: Oriented X4 Cognition Arousal/Alertness: Awake/alert Behavior During Therapy: WFL for tasks assessed/performed Overall Cognitive Status: Within Functional Limits for tasks assessed     Blood pressure (!) 152/88, pulse (!) 55, temperature  98 F (36.7 C), temperature source Oral, resp. rate 16, height 5\' 3"  (1.6 m), weight 76.2 kg, SpO2 95 %. Physical Exam  Nursing note and vitals reviewed. Constitutional: She is oriented to person, place, and time. She appears well-developed and well-nourished. No distress.  HENT:  Head: Normocephalic and atraumatic.  Eyes: Pupils are equal, round, and reactive to light. EOM are normal.  Neck: Normal range of motion. No tracheal deviation present. No thyromegaly present.  Cardiovascular: Normal rate and regular rhythm. Exam reveals no friction rub.  No murmur heard. Respiratory: Effort normal. No respiratory distress. She has no wheezes.  GI: Soft. She exhibits no distension. There is no abdominal tenderness.  Musculoskeletal:        General: No edema.     Comments: Right palm with sutures intact. Minimal tenderness to palpation.   Neurological: She is alert and oriented to person, place, and time.  UE 5/5 except for right wrist/grip which is limited by recent surgery. RLE: 1+ HF, KE and 3+/5 ADF/PF. LLE: tr HF, KE and 2+/5 ADF/PF. Decreased LT below the upper thigh prox to distal. Normal sensation in UE's less sl decrease in the right carpal tunnel distribution.  DTR's absent.   Skin: Skin is warm and dry. She is not diaphoretic.  Psychiatric: She has a normal mood and affect. Her behavior is normal. Judgment and thought content normal.      Lab Results Last 48 Hours  No results found for this or any previous visit (from the past 48 hour(s)).   Imaging Results (Last 48 hours)  No results found.           Medical Problem List and Plan: 1.  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 Axial scans reviewed which do not show any signs of myelopathy.             -monitor for progress in therapies. Consider f/u MRI of lumbar spine and neurology re-consult 2.   Antithrombotics: -DVT/anticoagulation:  Pharmaceutical: Lovenox             -antiplatelet therapy: N/A 3. Pain Management: Ultram prn  -pain control appears adequate at present 4. Mood: LCSW to follow for evaluation and support.              -antipsychotic agents: N/A 5. Neuropsych: This patient is capable of making decisions on her own behalf. 6. Skin/Wound Care: Routine pressure relief measures.  7.  Fluids/Electrolytes/Nutrition: Monitor I/O. Check lytes in am.  8. GERD: Continue PPI 9. Right carpel tunnel release: Will order splint given increased need to utilize UE's for transfer, etc. Sutures to come out Monday? 10. Bowel and bladder:             -appears to be continent             -monitor for patterns         Bary Leriche, PA-C 12/15/2018  I have personally performed a face to face diagnostic evaluation of this patient and formulated the key components of the plan.  Additionally, I have personally reviewed laboratory data, imaging studies, as well as relevant notes and concur with the physician assistant's documentation above.  The patient's status has not changed from the original H&P.  Any changes in documentation from the acute care chart have been noted above.  Meredith Staggers, MD, [facility]

## 2018-12-15 NOTE — Progress Notes (Signed)
Pt performed NIF and  VC. Her NIF is -80. VC is well over 2L.

## 2018-12-15 NOTE — Progress Notes (Signed)
Chaplain visited with patient to provide information on advanced directives.  The patient expressed concern over not knowing what is wrong.  This lack of knowledge is concerning for her.  The patient asked the chaplain to pray for her and the chaplain did.  The chaplain also gave the patient information regarding chaplain availability.  The chaplain is available if the patient requests a visit.  Brion Aliment Chaplain Resident For questions concerning this note please contact me by pager [phone]

## 2018-12-15 NOTE — TOC Transition Note (Signed)
Transition of Care [facility]) - CM/SW Discharge Note   Patient Details  Name: Paula Sutton MRN: [id number] Date of Birth: Nov 02, 1955  Transition of Care [facility]) CM/SW Contact:  Ihor Gully, LCSW Phone Number: 12/15/2018, 10:45 AM   Clinical Narrative:    Claiborne Billings at [facility] confirmed that CIR is accepting patient. CIR would like patient to admit as soon as possible. Claiborne Billings has spoken with Dr. Dyann Kief who is aware of CIR acceptance. RN notified and will return call to LCSW when she has prepared patient for transport.          Patient Goals and CMS Choice Patient states their goals for this hospitalization and ongoing recovery are:: To get back to baseline and being independent. CMS Medicare.gov Compare Post Acute Care list provided to:: Patient Choice offered to / list presented to : Patient  Discharge Placement                       Discharge Plan and Services     Post Acute Care Choice: [place]                               Social Determinants of Health (SDOH) Interventions     Readmission Risk Interventions No flowsheet data found.

## 2018-12-15 NOTE — Progress Notes (Signed)
Inpatient Rehabilitation-Admissions Coordinator   I have received insurance approval and medical clearance from Dr. Dyann Kief for admit to CIR today. Pt updated and excited to start IP Rehab. RN updated on plan. Will contact CM/SW regarding plan.   Will begin working on setting up transport to [employer] [facility].   Please call if questions.   Paula Sutton, OTR/L  Rehab Admissions Coordinator  [phone] 12/15/2018 10:11 AM

## 2018-12-15 NOTE — Progress Notes (Signed)
Patient ID: Paula Sutton, female   DOB: [birth year], 63 y.o.   MRN: [id number] Patient was admitted to 4M01 via stretcher, escorted by EMS staff.  Patient verbalized understanding of rehab process including fall prevention policy, personal belongings policy, and visitation policy.  Patient appears to be in no immediate distress at this time.  Brita Romp, RN

## 2018-12-15 NOTE — Discharge Summary (Signed)
Physician Discharge Summary  Paula Sutton [id number] DOB: 11/04/1955 DOA: 12/08/2018  PCP: Celene Squibb, MD  Admit date: 12/08/2018 Discharge date: 12/15/2018  Time spent: 35 minutes  Recommendations for Outpatient Follow-up:  1. Follow up with neurology and PCP after discharge from CIR.   Discharge Diagnoses:  Principal Problem:   Leg weakness, bilateral Active Problems:   GERD (gastroesophageal reflux disease)   IBS (irritable bowel syndrome)   Discharge Condition: patient stable and improving. Will discharge to CIR for rehabilitation and conditioning.  Diet recommendation: heart healthy diet   Filed Weights   12/08/18 0822 12/09/18 1037 12/10/18 0500  Weight: 72.6 kg 73.9 kg 76.2 kg    History of present illness:  Paula Sutton a 63 y.o.femalewith a history of GERD, dyslipidemia, arthritis, recent carpal tunnel release on the right upper extremity. Patient seen for sudden onset of lower extremity weakness with paresthesias and cramping that started this morning around 6 AM. She reports no new medications other than hydrocodone that she started yesterday after her carpal tunnel release. Left lower extremity weakness is greater than right. No palliating or provoking factors. She has not had a flu or any other vaccine recently. She denies diarrhea, nausea, vomiting, headache, chest pain, shortness of breath. No rashes. No loss of bladder or bowel function and reports no new injuries, trauma, or fall.  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 Patient insurance has approved her for inpatient rehab.  -patient continues to have difficulty moving her legs, keeping them up against gravity and  supporting herself to stand.  Overall with good attitude and looking to participate in rehab.  IBS with constipation -BM noted on 9/29 again -will continue Linzess and miralax as needed -advise to maintain adequate hydration and to increase fiber in her diet.Marland Kitchen  GERD -will continue PPI  Recent carpal tunnel surgery -healing appropriately  -will also need rehab for that.   Procedures: See below for x-ray reports Lumbar puncture  Dyslipidemia -continue heart healthy diet -resume red rice yeast at discharge.  Consultations:  Neurology service  [facility]IR  Discharge Exam: Vitals:   12/14/18 2125 12/15/18 0549  BP: (!) 168/84 (!) 152/88  Pulse: 70 (!) 55  Resp: 16 16  Temp: 98.4 F (36.9 C) 98 F (36.7 C)  SpO2: 100% 95%   General exam: Alert, awake, oriented x 3; afebrile, no chest pain, no shortness of breath, no nausea, no vomiting. Continues to be significantly weak on her legs bilaterally. Respiratory system:  Clear to auscultation. Respiratory effort normal.  Cardiovascular system: RRR. No murmurs, gallops, or rubs Gastrointestinal system: Abdomen Abdomen is nondistended, soft and nontender. No organomegaly or masses felt. Positive bowel sounds.  Central nervous system: Alert and oriented. No focal neurological deficits.  Extremities: No cyanosis, clubbing or edema appreciated Skin: No rashes, lesions or ulcers  Psychiatry: Mood and affect appropriate. Judgement and insight appear normal.     Discharge Instructions    Allergies as of 12/15/2018      Reactions   Sulfa Antibiotics Anaphylaxis, Other (See Comments)   Tongue swelling       Medication List    STOP taking these medications   OVER THE COUNTER MEDICATION     TAKE these medications   acetaminophen 325 MG tablet Commonly known  as: TYLENOL Take 2 tablets (650 mg total) by mouth every 4 (four) hours as needed for mild pain, fever or headache.   CALCIUM PO Take 1 tablet by mouth 2 (two) times  daily.   estradiol 0.1 mg/24hr patch Commonly known as: CLIMARA - Dosed in mg/24 hr Place 0.1 mg onto the skin once a week.   K2 PLUS D3 PO Take by mouth daily.   lansoprazole 30 MG capsule Commonly known as: Prevacid 1 po 30 mins prior to first meal AND LAST MEAL   linaclotide 145 MCG Caps capsule Commonly known as: Linzess Take 1 capsule (145 mcg total) by mouth daily before breakfast. What changed:   when to take this  reasons to take this   nystatin powder Commonly known as: MYCOSTATIN/NYSTOP Apply topically 2 (two) times daily.   ondansetron 4 MG tablet Commonly known as: ZOFRAN Take 1 tablet (4 mg total) by mouth every 6 (six) hours as needed for nausea.   polyethylene glycol 17 g packet Commonly known as: MIRALAX / GLYCOLAX Take 17 g by mouth daily as needed.   Vitamin D3 125 MCG (5000 UT) Caps Take 5,000 Units by mouth 2 (two) times daily.      Allergies  Allergen Reactions  . Sulfa Antibiotics Anaphylaxis and Other (See Comments)    Tongue swelling    Follow-up Information    Celene Squibb, MD Follow up in 2 week(s).   Specialty: Internal Medicine Why: after discharge from CIR Contact information: [facility] [address] [phone]        Phillips Odor, MD. Schedule an appointment as soon as possible for a visit in 4 week(s).   Specialty: Neurology Why: after discharge from [place] information: [address] [place] [address] [phone]           The results of significant diagnostics from this hospitalization (including imaging, microbiology, ancillary and laboratory) are listed below for reference.    Significant Diagnostic Studies: Mr Cervical Spine Wo Contrast  Result Date: 12/08/2018 CLINICAL DATA:  Low back pain, cervical pain, weakness in both legs EXAM: MRI CERVICAL SPINE WITHOUT CONTRAST TECHNIQUE: Multiplanar, multisequence MR imaging of the cervical spine was performed. No intravenous  contrast was administered. COMPARISON:  None. FINDINGS: Alignment: 2 mm anterolisthesis of C7 on T1. Vertebrae: No fracture, evidence of discitis, or bone lesion. Cord: Normal signal and morphology. Posterior Fossa, vertebral arteries, paraspinal tissues: Posterior fossa demonstrates no focal abnormality. Vertebral artery flow voids are maintained. Paraspinal soft tissues are unremarkable. Disc levels: Discs: Degenerative disease with disc height loss at C4-5, C5-6 and C6-7. C2-3: No significant disc bulge. No neural foraminal stenosis. No central canal stenosis. C3-4: Broad-based disc bulge. Left uncovertebral degenerative changes. Severe left foraminal stenosis. No right foraminal stenosis. No central canal stenosis. C4-5: Mild broad-based disc bulge. Bilateral uncovertebral degenerative changes. Severe bilateral foraminal stenosis. Mild spinal stenosis. C5-6: Broad-based disc osteophyte complex. Bilateral uncovertebral degenerative changes. Mild bilateral foraminal stenosis. No central canal stenosis. C6-7: Mild broad-based disc bulge. No neural foraminal stenosis. No central canal stenosis. C7-T1: No significant disc bulge. Moderate right foraminal stenosis. No left foraminal stenosis. No central canal stenosis. T1-2: Mild broad-based disc bulge.  Mild left foraminal stenosis. T2-3: Broad-based disc bulge with a small left paracentral disc protrusion. No foraminal stenosis. IMPRESSION: 1. Diffuse cervical spine spondylosis as described above. 2.  No acute osseous injury of the cervical spine. Electronically Signed   By: Kathreen Devoid   On:  12/08/2018 14:43   Mr Thoracic Spine Wo Contrast  Result Date: 12/08/2018 CLINICAL DATA:  Low back pain. Pain, numbness, and weakness in both legs. Symptoms for 1 day. EXAM: MRI THORACIC SPINE WITHOUT CONTRAST TECHNIQUE: Multiplanar, multisequence MR imaging of the thoracic spine was performed. No intravenous contrast was administered. COMPARISON:  None. FINDINGS:  Alignment:  Trace anterolisthesis of C7 on T1. Vertebrae: No fracture or suspicious marrow lesion. Mild degenerative endplate changes most notable at T7-8 and T8-9. Cord:  Normal signal. Paraspinal and other soft tissues: 8 mm well-circumscribed T2 hyperintense focus in the right hepatic lobe, incompletely evaluated on this study though potentially reflecting a cyst or hemangioma. Disc levels: Mild disc bulging throughout the thoracic spine without cord compression or significant spinal stenosis. Mild-to-moderate facet arthrosis in the mid and lower thoracic spine without significant associated neural foraminal stenosis. Asymmetric left ligamentum flavum thickening at T10-11 without significant spinal stenosis. Moderate right neural foraminal stenosis at C7-T1 due to anterolisthesis, endplate spurring, and facet arthrosis. IMPRESSION: 1. Mild thoracic disc degeneration without spinal stenosis. 2. Moderate right neural foraminal stenosis at C7-T1. 3. Normal appearance of the thoracic spinal cord. Electronically Signed   By: Logan Bores M.D.   On: 12/08/2018 10:51   Mr Lumbar Spine Wo Contrast  Result Date: 12/08/2018 CLINICAL DATA:  Low back pain. Pain, numbness, and weakness in both legs. Symptoms for 1 day. EXAM: MRI LUMBAR SPINE WITHOUT CONTRAST TECHNIQUE: Multiplanar, multisequence MR imaging of the lumbar spine was performed. No intravenous contrast was administered. COMPARISON:  None. FINDINGS: Segmentation:  Standard. Alignment: Mild-to-moderate lumbar levoscoliosis. Trace retrolisthesis of L1 on L2 and L2 on L3. Vertebrae: No fracture, suspicious osseous lesion, or significant marrow edema. Conus medullaris and cauda equina: Conus extends to the lower L1 level. Conus and cauda equina appear normal. Paraspinal and other soft tissues: Stones in the gallbladder measuring up to 1.2 cm. Disc levels: Disc desiccation throughout the lumbar spine. Mild disc space narrowing from L2-3 to L4-5. L1-2: Mild disc  bulging eccentric to the left without stenosis. L2-3: Circumferential disc bulging and mild facet hypertrophy result in minimal bilateral neural foraminal narrowing inferiorly without spinal stenosis. L3-4: Mild disc bulging eccentric to the left and slight facet hypertrophy result in minimal left neural foraminal narrowing without spinal stenosis. L4-5: Disc bulging eccentric to the right and slight facet hypertrophy result in mild right neural foraminal stenosis. There is a small right central disc extrusion with mild superior migration without significant spinal or lateral recess stenosis. Patent left neural foramen. L5-S1: Mild leftward disc bulging and mild facet hypertrophy result in mild left neural foraminal stenosis without spinal stenosis. IMPRESSION: Mild lumbar disc and facet degeneration with mild neural foraminal stenosis at L4-5 and L5-S1. No spinal stenosis. Electronically Signed   By: Logan Bores M.D.   On: 12/08/2018 10:41   Dg Fluoro Guided Needle Plc Aspiration/injection Loc  Result Date: 12/08/2018 CLINICAL DATA:  BILATERAL lower extremity weakness EXAM: DIAGNOSTIC LUMBAR PUNCTURE UNDER FLUOROSCOPIC GUIDANCE FLUOROSCOPY TIME:  Fluoroscopy Time:  0 minutes 12 seconds Radiation Exposure Index (if provided by the fluoroscopic device): 4.4 mGy Number of Acquired Spot Images: Single screen capture PROCEDURE: Procedure, benefits, and risks were discussed with the patient, including alternatives. Patient's questions were answered. Written informed consent was obtained. Timeout protocol followed. Patient placed prone. L4-L5 disc space was localized under fluoroscopy. Skin prepped and draped in usual sterile fashion. Skin and soft tissues anesthetized with 2 mL of 1% lidocaine. 22 gauge needle was advanced into  the spinal canal where clear colorless CSF was encountered with an opening pressure of 15 cm H2O (LLD position). 12.5 mL of CSF was obtained in 4 tubes for requested analysis. Procedure  tolerated very well by patient without immediate complication. IMPRESSION: Fluoroscopic guided lumbar puncture as above. Electronically Signed   By: Lavonia Dana M.D.   On: 12/08/2018 18:13    Microbiology: Recent Results (from the past 240 hour(s))  CSF culture     Status: None   Collection Time: 12/08/18  5:08 PM   Specimen: CSF; Cerebrospinal Fluid  Result Value Ref Range Status   Specimen Description   Final    CSF Performed at [facility], [address], [place], [place] [address]    Special Requests   Final    NONE Performed at [facility], [address], [place], [place] [address]    Gram Stain   Final    CYTOSPIN SMEAR NO CELLS OR ORGANISMS SEEN Gram Stain Report Called to,Read Back By and Verified With: SAPPELT. J. AT 1908 12/08/2018 BY EVA Performed at [facility], [address], [place], [place] [address]    Culture   Final    NO GROWTH 3 DAYS Performed at [facility], [address] [place], [place] [address]    Report Status 12/12/2018 FINAL  Final  SARS Coronavirus 2 [facility] order, Performed in [facility] hospital lab) Nasopharyngeal Nasopharyngeal Swab     Status: None   Collection Time: 12/08/18  7:20 PM   Specimen: Nasopharyngeal Swab  Result Value Ref Range Status   SARS Coronavirus 2 NEGATIVE NEGATIVE Final    Comment: (NOTE) If result is NEGATIVE SARS-CoV-2 target nucleic acids are NOT DETECTED. The SARS-CoV-2 RNA is generally detectable in upper and lower  respiratory specimens during the acute phase of infection. The lowest  concentration of SARS-CoV-2 viral copies this assay can detect is 250  copies / mL. A negative result does not preclude SARS-CoV-2 infection  and should not be used as the sole basis for treatment or other  patient management decisions.  A negative result may occur with  improper specimen collection / handling, submission of specimen other  than nasopharyngeal swab, presence of viral mutation(s) within the  areas  targeted by this assay, and inadequate number of viral copies  (<250 copies / mL). A negative result must be combined with clinical  observations, patient history, and epidemiological information. If result is POSITIVE SARS-CoV-2 target nucleic acids are DETECTED. The SARS-CoV-2 RNA is generally detectable in upper and lower  respiratory specimens dur ing the acute phase of infection.  Positive  results are indicative of active infection with SARS-CoV-2.  Clinical  correlation with patient history and other diagnostic information is  necessary to determine patient infection status.  Positive results do  not rule out bacterial infection or co-infection with other viruses. If result is PRESUMPTIVE POSTIVE SARS-CoV-2 nucleic acids MAY BE PRESENT.   A presumptive positive result was obtained on the submitted specimen  and confirmed on repeat testing.  While 2019 novel coronavirus  (SARS-CoV-2) nucleic acids may be present in the submitted sample  additional confirmatory testing may be necessary for epidemiological  and / or clinical management purposes  to differentiate between  SARS-CoV-2 and other Sarbecovirus currently known to infect humans.  If clinically indicated additional testing with an alternate test  methodology [phone]) is advised. The SARS-CoV-2 RNA is generally  detectable in upper and lower respiratory sp ecimens during the acute  phase  of infection. The expected result is Negative. Fact Sheet for Patients:  StrictlyIdeas.no Fact Sheet for Healthcare Providers: BankingDealers.co.za This test is not yet approved or cleared by the [place] FDA and has been authorized for detection and/or diagnosis of SARS-CoV-2 by FDA under an Emergency Use Authorization (EUA).  This EUA will remain in effect (meaning this test can be used) for the duration of the COVID-19 declaration under Section 564(b)(1) of the Act, 21 U.S.C. section  360bbb-3(b)(1), unless the authorization is terminated or revoked sooner. Performed at [facility], [address] [place], [place] [address]   MRSA PCR Screening     Status: None   Collection Time: 12/09/18 10:39 AM   Specimen: Nasal Mucosa; Nasopharyngeal  Result Value Ref Range Status   MRSA by PCR NEGATIVE NEGATIVE Final    Comment:        The GeneXpert MRSA Assay (FDA approved for NASAL specimens only), is one component of a comprehensive MRSA colonization surveillance program. It is not intended to diagnose MRSA infection nor to guide or monitor treatment for MRSA infections. Performed at [facility], [address] [place], [place] [address]      Labs: Basic Metabolic Panel: Recent Labs  Lab 12/09/18 0616 12/10/18 0504 12/12/18 0507  NA 140 140 137  K 4.3 3.6 4.0  CL 109 111 108  CO2 24 23 21*  GLUCOSE 106* 99 94  BUN 16 14 21   CREATININE 0.81 0.68 0.80  CALCIUM 9.3 8.8* 8.8*   CBC: Recent Labs  Lab 12/09/18 0616 12/10/18 0504 12/12/18 0507  WBC 9.8 6.2 5.4  HGB 13.6 13.3 13.4  HCT 43.4 41.4 41.9  MCV 94.3 92.4 93.3  PLT 243 228 202   Signed:  Barton Dubois MD.  [facility] 12/15/2018, 10:40 AM

## 2018-12-15 NOTE — Progress Notes (Signed)
Meredith Staggers, MD  Physician  Physical Medicine and Rehabilitation  PMR Pre-admission  Signed  Date of Service:  12/14/2018 1:30 PM      Related encounter: ED to Hosp-Admission (Discharged) from 12/08/2018 in [place]         PMR Admission Coordinator Pre-Admission Assessment  Patient: Paula Sutton is an 63 y.o., female MRN: [id number] DOB: [birth year] Height: 5' 3" (160 cm) Weight: 76.2 kg  Insurance Information HMO:     PPO: yes     PCP:      IPA:      80/20:      OTHER:  PRIMARY: UHC      Policy#: [id number]      Subscriber: Patient CM Name: Ivin Booty      Phone#: [phone]     Fax#: [phone] Pre-Cert#: [id number]      Employer:  [employer] provided by Ivin Booty on 10/2 for admit to CIR. Pt is approved for 7 days. Updates due on day 7 (10/2-10/8). Follow up CM is Princess Ugma (p): [phone] (f): [phone] Benefits:  Phone #: online     Name: [email or web address] Eff. Date: 03/15/2018-03/15/2019     Deduct: does not have in-network deductible      Out of Pocket Max: [phone] met)      Life Max: NA CIR: 80% coverage, 20% co-insurance      SNF: 80% coverage; limited to 150 days per cal yr, 20% co-insurance Outpatient: 80% coverage; limited to 54 OT visits (79 remaining), 40 PT visits (76 remaining), and 40 ST visits (40 remaining) *all therapies can be approved for additional visits based on medical necessity review    Co-Pay: 20% co-insurance Home Health: 80% coverage; limited by medical necessity      Co-Pay: 20% co-insurance DME: 80% coverage     Co-Pay: 20% co-insurance Providers:  SECONDARY: Medicare Part A      Policy#: [id number]      Subscriber: Patient CM Name:       Phone#:      Fax#:  Pre-Cert#:       Employer:  Benefits:  Phone #:      Name: verified eligibility via [place] on 12/14/2018 Eff. Date: Part A effective 04/15/2018     Deduct: $1,408      Out of Pocket Max: NA      Life Max: NA CIR:       SNF:   Outpatient:      Co-Pay:  Home Health:       Co-Pay:  DME:      Co-Pay:   Medicaid Application Date:       Case Manager:  Disability Application Date:       Case Worker:   The "Data Collection Information Summary" for patients in Inpatient Rehabilitation Facilities with attached "Privacy Act [place] Records" was provided and verbally reviewed with: Patient  Emergency Contact Information         Contact Information    Name Relation Home Work Mobile   Detienne,Stuart Spouse [phone]  [phone]      Current Medical History  Patient Admitting Diagnosis: 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 Pt presented to [facility] with sudden onset of lower extremity weakness, paraesthesias, and cramping. Pt's lower extremity weakness is thought to be secondary  to Guillan-Barre Syndrome and pt was stated on 5 days of IVIG. Respiratory therapy has been following patient for forced vital capacity evaluations. Pt has required labetalol for blood pressure elevations. Pt has been evaluated by therapies with recommendation for CIR. Pt is to be admitted for a comprehensive rehab program on 12/15/2018.     Patient's medical record from [facility] has been reviewed by the rehabilitation admission coordinator and physician.  Past Medical History      Past Medical History:  Diagnosis Date  . Arthritis    knee  . Dyslipidemia   . GERD (gastroesophageal reflux disease)   . Left anterior fascicular block    PATIENT SAYS THIS SHOWED ON EKG DURING COLONOSCOPY , WAS REFERRED TO CARDIOLOGY DR. Minus Breeding  (SEE EPIC ENCOUNTER 09-2017)       Family History   family history includes Alcohol abuse in her mother.  Prior Rehab/Hospitalizations Has the patient had prior rehab or hospitalizations prior  to admission? No  Has the patient had major surgery during 100 days prior to admission? Yes             Current Medications  Current Facility-Administered Medications:  .  acetaminophen (TYLENOL) tablet 650 mg, 650 mg, Oral, Q4H PRN, Barton Dubois, MD, 650 mg at 12/15/18 0811 .  Chlorhexidine Gluconate Cloth 2 % PADS 6 each, 6 each, Topical, Daily, Heath Lark D, DO, 6 each at 12/12/18 1031 .  enoxaparin (LOVENOX) injection 40 mg, 40 mg, Subcutaneous, Q24H, [place], Kofi, MD, 40 mg at 12/15/18 0901 .  estradiol (CLIMARA - Dosed in mg/24 hr) patch 0.1 mg, 0.1 mg, Transdermal, Weekly, Truett Mainland, DO, Stopped at 12/08/18 2008 .  fluticasone (FLONASE) 50 MCG/ACT nasal spray 1 spray, 1 spray, Each Nare, Daily, Manuella Ghazi, Pratik D, DO, 1 spray at 12/15/18 0901 .  labetalol (NORMODYNE) injection 5 mg, 5 mg, Intravenous, Q2H PRN, Manuella Ghazi, Pratik D, DO .  linaclotide (LINZESS) capsule 72 mcg, 72 mcg, Oral, QAC breakfast, Heath Lark D, DO, 72 mcg at 12/15/18 0901 .  loratadine (CLARITIN) tablet 10 mg, 10 mg, Oral, Daily, Manuella Ghazi, Pratik D, DO, 10 mg at 12/15/18 0901 .  nystatin (MYCOSTATIN/NYSTOP) topical powder, , Topical, BID, Shah, Pratik D, DO .  ondansetron (ZOFRAN) tablet 4 mg, 4 mg, Oral, Q6H PRN **OR** ondansetron (ZOFRAN) injection 4 mg, 4 mg, Intravenous, Q6H PRN, Stinson, Jacob J, DO .  pantoprazole (PROTONIX) EC tablet 40 mg, 40 mg, Oral, Daily, Truett Mainland, DO, 40 mg at 12/15/18 0901 .  polyethylene glycol (MIRALAX / GLYCOLAX) packet 17 g, 17 g, Oral, Daily PRN, Truett Mainland, DO, 17 g at 12/08/18 2141 .  sodium phosphate (FLEET) 7-19 GM/118ML enema 1 enema, 1 enema, Rectal, Daily PRN, Manuella Ghazi, Pratik D, DO, 1 enema at 12/10/18 1506  Patients Current Diet:     Diet Order                  Diet regular Room service appropriate? Yes; Fluid consistency: Thin  Diet effective now               Precautions / Restrictions Precautions Precautions: Fall Restrictions  Weight Bearing Restrictions: No   Has the patient had 2 or more falls or a fall with injury in the past year? No  Prior Activity Level Community (5-7x/wk): not working, but drove PTA. Pt Independent PTA and requried no AD  Prior Functional Level Self Care: Did the patient need help bathing, dressing, using the toilet  or eating? Independent  Indoor Mobility: Did the patient need assistance with walking from room to room (with or without device)? Independent  Stairs: Did the patient need assistance with internal or external stairs (with or without device)? Independent  Functional Cognition: Did the patient need help planning regular tasks such as shopping or remembering to take medications? Independent  Home Assistive Devices / Equipment Home Assistive Devices/Equipment: None Home Equipment: Environmental consultant - 2 wheels, Cane - single point, Civil engineer, contracting  Prior Device Use: Indicate devices/aids used by the patient prior to current illness, exacerbation or injury? None of the above  Current Functional Level Cognition  Overall Cognitive Status: Within Functional Limits for tasks assessed Orientation Level: Oriented X4    Extremity Assessment (includes Sensation/Coordination)  Upper Extremity Assessment: Generalized weakness, RUE deficits/detail, LUE deficits/detail RUE Deficits / Details: grossly 4/5 to 4+/5. Wrist grossly 4-/5 (s/p carpal tunnel surgery on 12/07/2018) RUE Sensation: WNL RUE Coordination: WNL LUE Deficits / Details: grossly 4+/5 LUE Sensation: WNL LUE Coordination: WNL  Lower Extremity Assessment: Defer to PT evaluation RLE Deficits / Details: grossly 2/5 RLE Sensation: WNL RLE Coordination: decreased gross motor LLE Deficits / Details: grossly 2/5 LLE Sensation: WNL LLE Coordination: decreased gross motor    ADLs  Overall ADL's : Needs assistance/impaired Eating/Feeding: Modified independent, Sitting Grooming: Set up, Sitting Upper Body Bathing: Minimal  assistance, Sitting Upper Body Bathing Details (indicate cue type and reason): for reaching back and maintaining seated balance Lower Body Bathing: Minimal assistance, Sitting/lateral leans Lower Body Bathing Details (indicate cue type and reason): requiring UB support; assist for maintaining balance when leaning Upper Body Dressing : Min guard, Sitting Lower Body Dressing: Total assistance, Bed level Toilet Transfer: Minimal assistance, Moderate assistance, Transfer board, BSC, Cueing for sequencing(sliding board) Toilet Transfer Details (indicate cue type and reason): simulated with bed to chair transfer; cuing for hand placement, one rest break required during task General ADL Comments: Pt motivated to participate and push herself during tasks.     Mobility  Overal bed mobility: Needs Assistance Bed Mobility: Supine to Sit Supine to sit: Min assist General bed mobility comments: slow labored movement with fair/good return for propping up on elbows to hands, limited US of right hand due to carpal tunnel surgery    Transfers  Overall transfer level: Needs assistance Equipment used: Sliding board Transfers: Lateral/Scoot Transfers  Lateral/Scoot Transfers: Min assist, Mod assist General transfer comment: unable to stand, required use of sliding board, demonstrates slow labored movement frequent rest breaks, able to use RLE some, but required knees blocked to avoid sliding forward during transfer to chair    Ambulation / Gait / Stairs / Office manager / Balance Dynamic Sitting Balance Sitting balance - Comments: fair static sitting balance, fair dynamic when leaning forward, poor when leaning backwards and left/right laterally Balance Overall balance assessment: Needs assistance Sitting-balance support: Feet supported, No upper extremity supported Sitting balance-Leahy Scale: Fair Sitting balance - Comments: fair static sitting balance, fair dynamic when  leaning forward, poor when leaning backwards and left/right laterally Postural control: Posterior lean, Right lateral lean, Left lateral lean(see above)    Special needs/care consideration BiPAP/CPAP : no CPM : no Continuous Drip IV: no Dialysis : no        Days : no Life Vest : no Oxygen : on RA Special Bed : no Trach Size : no Wound Vac (area) : no      Location : no Skin: right wrist incision (  carpal tunnel release? Prior to hospitalization)                   Bowel mgmt: continent, last BM: 12/15/2018, continent Bladder mgmt: continent Diabetic mgmt: no Behavioral consideration : no Chemo/radiation : no   Previous Home Environment (from acute therapy documentation) Living Arrangements: Spouse/significant other Available Help at Discharge: Family, Available PRN/intermittently Type of Home: House Home Layout: One level Home Access: Stairs to enter Entrance Stairs-Rails: None Entrance Stairs-Number of Steps: 1 Bathroom Shower/Tub: Optometrist: Yes Home Care Services: No  Discharge Living Setting Plans for Discharge Living Setting: Patient's home, Lives with (comment)(spouse) Type of Home at Discharge: House Discharge Home Layout: One level Discharge Home Access: Stairs to enter Entrance Stairs-Rails: None Entrance Stairs-Number of Steps: 1 Discharge Bathroom Shower/Tub: Walk-in shower Discharge Bathroom Toilet: Standard Discharge Bathroom Accessibility: Yes How Accessible: Accessible via walker Does the patient have any problems obtaining your medications?: No  Social/Family/Support Systems Patient Roles: Spouse Contact Information: husband: [phone] Anticipated Caregiver: husband Anticipated Caregiver's Contact Information: see above Ability/Limitations of Caregiver: Min A Caregiver Availability: 24/7(can take time off work; lots of family support) Discharge Plan Discussed with Primary Caregiver: Yes Is  Caregiver In Agreement with Plan?: Yes Does Caregiver/Family have Issues with Lodging/Transportation while Pt is in Rehab?: No  Goals/Additional Needs Patient/Family Goal for Rehab: PT/OT: Mod I/Supervision WC level; SLP: NA Expected length of stay: 10-14 days Cultural Considerations: [employer] Dietary Needs: regular diet, think liquids Equipment Needs: TBD Pt/Family Agrees to Admission and willing to participate: Yes Program Orientation Provided & Reviewed with Pt/Caregiver Including Roles  & Responsibilities: Yes  Barriers to Discharge: Home environment access/layout  Barriers to Discharge Comments: 1 step to enter. bathroom may only be RW accessible   Decrease burden of Care through IP rehab admission: NA  Possible need for SNF placement upon discharge: Not anticipated; pt and family are prepares for pt to return home at wc level if necessary and are getting home accessible for wc use. Pt's husband states he can take time off work if need to be to there with his wife at DC.   Patient Condition: I have reviewed medical records from [facility], spoken with MD and RN, and patient and spouse. I discussed via phone for inpatient rehabilitation assessment.  Patient will benefit from ongoing PT and OT, can actively participate in 3 hours of therapy a day 5 days of the week, and can make measurable gains during the admission.  Patient will also benefit from the coordinated team approach during an Inpatient Acute Rehabilitation admission.  The patient will receive intensive therapy as well as Rehabilitation physician, nursing, social worker, and care management interventions.  Due to safety, skin/wound care, disease management, medication administration, pain management and patient education the patient requires 24 hour a day rehabilitation nursing.  The patient is currently Min/Mod A for lateral scoot transfers (no ambulation at this time) and Min A to Total A for basic ADLs.  Discharge  setting and therapy post discharge at home with home health is anticipated.  Patient has agreed to participate in the Acute Inpatient Rehabilitation Program and will admit 12/15/2018.  Preadmission Screen Completed By:  Raechel Ache, 12/15/2018 10:01 AM ______________________________________________________________________   Discussed status with Dr. Naaman Plummer on 12/15/2018 at 10:01AM and received approval for admission today.  Admission Coordinator:  Raechel Ache, OT, time 10:01AM/Date 12/15/2018   Assessment/Plan: Diagnosis: GBS 1. Does the need for close, 24 hr/day Medical supervision in  concert with the patient's rehab needs make it unreasonable for this patient to be served in a less intensive setting? Yes 2. Co-Morbidities requiring supervision/potential complications: Arthritis, GERD 3. Due to bladder management, bowel management, safety, skin/wound care, disease management, medication administration, pain management and patient education, does the patient require 24 hr/day rehab nursing? Yes 4. Does the patient require coordinated care of a physician, rehab nurse, PT (1-2 hrs/day, 5 days/week) and OT (1-2 hrs/day, 5 days/week) to address physical and functional deficits in the context of the above medical diagnosis(es)? Yes Addressing deficits in the following areas: balance, endurance, locomotion, strength, transferring, bowel/bladder control, bathing, dressing, feeding, grooming, toileting and psychosocial support 5. Can the patient actively participate in an intensive therapy program of at least 3 hrs of therapy 5 days a week? Yes 6. The potential for patient to make measurable gains while on inpatient rehab is excellent 7. Anticipated functional outcomes upon discharge from inpatients are: modified independent and supervision PT, modified independent and supervision OT, n/a SLP---w/c level 8. Estimated rehab length of stay to reach the above functional goals is: 10-14 days 9. Anticipated  D/C setting: Home 10. Anticipated post D/C treatments: [place] therapy 11. Overall Rehab/Functional Prognosis: excellent  MD Signature: Meredith Staggers, MD, [facility] [facility] Physical Medicine & Rehabilitation 12/15/2018         Revision History Date/Time User Provider Type Action  12/15/2018 10:11 AM Meredith Staggers, MD Physician Sign  12/15/2018 10:05 AM Raechel Ache, OT Rehab Admission Coordinator Share  View Details Report

## 2018-12-16 ENCOUNTER — Inpatient Hospital Stay (HOSPITAL_COMMUNITY): Payer: 59 | Admitting: Physical Therapy

## 2018-12-16 ENCOUNTER — Inpatient Hospital Stay (HOSPITAL_COMMUNITY): Payer: 59 | Admitting: Occupational Therapy

## 2018-12-16 DIAGNOSIS — M7918 Myalgia, other site: Secondary | ICD-10-CM | POA: Diagnosis present

## 2018-12-16 DIAGNOSIS — G5600 Carpal tunnel syndrome, unspecified upper limb: Secondary | ICD-10-CM | POA: Diagnosis present

## 2018-12-16 LAB — GLUCOSE, CAPILLARY
Glucose-Capillary: 102 mg/dL — ABNORMAL HIGH (ref 70–99)
Glucose-Capillary: 110 mg/dL — ABNORMAL HIGH (ref 70–99)
Glucose-Capillary: 92 mg/dL (ref 70–99)
Glucose-Capillary: 117 mg/dL — ABNORMAL HIGH (ref 70–99)

## 2018-12-16 MED ORDER — LIDOCAINE 5 % EX PTCH
2.0000 | MEDICATED_PATCH | CUTANEOUS | Status: DC
  Administered 2018-12-16 – 2018-12-22 (×7): 2 via TRANSDERMAL
  Filled 2018-12-16 (×10): qty 2

## 2018-12-16 NOTE — Evaluation (Signed)
Physical Therapy Assessment and Plan  Patient Details  Name: Paula Sutton MRN: [id number] Date of Birth: 08-08-1955  PT Diagnosis: Abnormal posture, Difficulty walking, Impaired sensation, Muscle weakness and Paraplegia Rehab Potential: Good ELOS: 10-14 days   Today's Date: 12/16/2018 PT Individual Time: 0800-0900 PT Individual Time Calculation (min): 60 min    Problem List:  Patient Active Problem List   Diagnosis Date Noted  . Axonal GBS (Guillain-Barre syndrome) ([place]) 12/15/2018  . Leg weakness, bilateral 12/08/2018  . IBS (irritable bowel syndrome) 12/08/2018  . GERD (gastroesophageal reflux disease) 09/14/2018  . Failed total knee arthroplasty ([place]) 03/01/2018  . Abdominal pain, epigastric 06/03/2017  . Constipation 01/27/2016  . History of adenomatous polyp of colon 01/27/2016    Past Medical History:  Past Medical History:  Diagnosis Date  . Arthritis    knee  . Dyslipidemia   . GERD (gastroesophageal reflux disease)   . Left anterior fascicular block    PATIENT SAYS THIS SHOWED ON EKG DURING COLONOSCOPY , WAS REFERRED TO CARDIOLOGY DR. Minus Breeding  (SEE EPIC ENCOUNTER 09-2017)      Past Surgical History:  Past Surgical History:  Procedure Laterality Date  . ABDOMINAL HYSTERECTOMY    . BIOPSY  08/09/2017   Procedure: BIOPSY;  Surgeon: Danie Binder, MD;  Location: [facility];  Service: Endoscopy;;  gastric biopsy for h pylori  . CARPAL TUNNEL RELEASE Right 12/07/2018  . CATARACT EXTRACTION, BILATERAL     DID 1 YEAR APART , LAST ONE WAS 12-2017  . COLONOSCOPY WITH ESOPHAGOGASTRODUODENOSCOPY (EGD)  2014   [place] PROPOFOL N/A 08/09/2017   3 simple adenomas, diverticulosis in recto-sigmoid, sigmoid, and descending colin, moderate external and internal hemorrhoids  . ESOPHAGOGASTRODUODENOSCOPY (EGD) WITH PROPOFOL N/A 08/09/2017   Low-grade narrowing Schatzki ring due to GERD; small hiatal hernia; mild gastritis and  duodenitis due to NSAIDs; biopsy with gastritis  . KNEE ARTHROSCOPY Right 11/2017   WITH DR Wynelle Link  AT [place] Right   . POLYPECTOMY  08/09/2017   Procedure: POLYPECTOMY;  Surgeon: Danie Binder, MD;  Location: [facility];  Service: Endoscopy;;  cecal polyp hs, transverse colon polyp hs, rectal polyp cs  . TOTAL KNEE REVISION Right 03/01/2018   Procedure: RIGHT TOTAL KNEE REVISION;  Surgeon: Gaynelle Arabian, MD;  Location: [facility];  Service: Orthopedics;  Laterality: Right;  114mn    Assessment & Plan Clinical Impression:  Paula RAIMONDIis a 62year old female with history of OA, GERD, recent CTR right hand who was admitted on 12/08/18 with sudden onset of BLE weakness with severe spasms and paraesthesias. [facility] showed mild DDD/facet disease lumbar spine, moderate right neural foraminal stenosis C7-T1 and diffuse cervical spondylosis with right foraminal stenosis C3/4 and bilateral foraminal stenosis C4/5. She was found to have leukocytosis and LP done revealing elevated protein and elevated glucose--no oligoclonal bands seen. CSF culture without organism and no growth.. She was evaluated by Dr. DMerlene Laughterand started on 5 day course of IVIG due to albuminocytologic dissociation due to GBS.  She has had some recovery in RLE but continues to have paraplegia requiring knees blocked and frequent rest breaks but respiratory status stable. CIR recommended due to functional decline.Patient transferred to CIR on 12/15/2018 .   Patient currently requires mod with mobility secondary to muscle weakness, abnormal tone and unbalanced muscle activation and decreased sitting balance,  decreased postural control and decreased balance strategies.  Prior to hospitalization, patient was independent  with mobility and lived with Spouse in a House home.  Home access is 1Stairs to enter.  Patient will benefit from skilled PT intervention to maximize  safe functional mobility, minimize fall risk and decrease caregiver burden for planned discharge home with 24 hour assist.  Anticipate patient will benefit from follow up [facility] at discharge.  PT - End of Session Activity Tolerance: Tolerates 30+ min activity with multiple rests Endurance Deficit: Yes Endurance Deficit Description: frequent rest breaks during functional activities PT Assessment Rehab Potential (ACUTE/IP ONLY): Good PT Barriers to Discharge: Medical stability;Home environment access/layout PT Patient demonstrates impairments in the following area(s): Balance;Endurance;Motor;Safety;Sensory PT Transfers Functional Problem(s): Bed Mobility;Bed to Chair;Car;Furniture;Floor PT Locomotion Functional Problem(s): Ambulation;Wheelchair Mobility;Stairs PT Plan PT Intensity: Minimum of 1-2 x/day ,45 to 90 minutes PT Frequency: 5 out of 7 days PT Duration Estimated Length of Stay: 10-14 days PT Treatment/Interventions: Balance/vestibular training;Community reintegration;Discharge planning;Disease management/prevention;DME/adaptive equipment instruction;Functional electrical stimulation;Functional mobility training;Neuromuscular re-education;Pain management;Patient/family education;Psychosocial support;Therapeutic Activities;Therapeutic Exercise;UE/LE Strength taining/ROM;UE/LE Coordination activities;Wheelchair propulsion/positioning PT Transfers Anticipated Outcome(s): Supervision PT Locomotion Anticipated Outcome(s): mod I once up in w/c PT Recommendation Recommendations for Other Services: Neuropsych consult;Therapeutic Recreation consult Therapeutic Recreation Interventions: Stress management;Outing/community reintergration Follow Up Recommendations: Home health PT;Outpatient PT(TBD pending progress) Patient destination: Home Equipment Recommended: Wheelchair (measurements);Wheelchair cushion (measurements);Sliding [phone] w/c with standard cushion and leg rests) Equipment Details:  see above  Skilled Therapeutic Intervention Evaluation completed (see details above and below) with education on PT POC and goals and individual treatment initiated with focus on functional transfer assessment, orientation to rehab unit and schedule, and setting pt with equipment. Pt received seated in bed, agreeable to PT session. Pt reports some soreness in L hip, not rated and declines intervention. Rolling L/R with min A for dependent doffing of brief, max A to don underwear and pants. Supine to sit with max A for BLE management and trunk control. Pt is dependent to don socks and shoes while seated EOB. Slide board transfer bed to w/c with mod A, one instance of near LOB posteriorly, mod A to recover. Manual w/c propulsion x 50 ft with use of BUE and CGA before onset of fatigue and R hand pain from recent carpal tunnel surgery. Pt reports feeling dizzy in sitting, BP 148/76. Slide board transfer w/c to mat table then w/c to more narrow w/c for improved fit, mod A. Pt left seated in w/c in room with needs in reach at end of session.  PT Evaluation Precautions/Restrictions Precautions Precautions: Fall Restrictions Weight Bearing Restrictions: No Home Living/Prior Functioning Home Living Available Help at Discharge: Family;Friend(s);Available 24 hours/day Type of Home: House Home Access: Stairs to enter [employer] of Steps: 1 Entrance Stairs-Rails: None Home Layout: One level Additional Comments: church is builing a w/c accessible ramp  Lives With: Spouse Prior Function Level of Independence: Independent with gait;Independent with transfers  Able to Take Stairs?: Yes Driving: Yes Vocation: Volunteer work Leisure: Hobbies-yes (Comment) Comments: enjoys quilting, crafts with grandchildren Vision/Perception  Perception Perception: Within Functional Limits Praxis Praxis: Intact  Cognition Overall Cognitive Status: Within Functional Limits for tasks  assessed Arousal/Alertness: Awake/alert Orientation Level: Oriented X4 Attention: Sustained Sustained Attention: Appears intact Memory: Appears intact Awareness: Appears intact Problem Solving: Appears intact Safety/Judgment: Appears intact Sensation Sensation Light Touch: Appears Intact Proprioception: Impaired Detail Proprioception Impaired Details: Impaired RLE;Impaired LLE Coordination Gross Motor Movements are Fluid and Coordinated: No Fine Motor Movements are Fluid and  Coordinated: No Coordination and Movement Description: impaired 2/2 paraplegia and recent R carpal tunnel surgery Motor  Motor Motor: Paraplegia;Abnormal tone;Abnormal postural alignment and control Motor - Skilled Clinical Observations: paraplegia 2/2 GBS  Mobility Bed Mobility Bed Mobility: Rolling Right;Rolling Left;Supine to Sit;Sit to Supine Rolling Right: Minimal Assistance - Patient > 75% Rolling Left: Minimal Assistance - Patient > 75% Supine to Sit: Maximal Assistance - Patient - Patient 25-49% Sit to Supine: Maximal Assistance - Patient 25-49% Transfers Transfers: Lateral/Scoot Transfers Lateral/Scoot Transfers: Moderate Assistance - Patient 50-74% Transfer (Assistive device): Other (Comment)(slide board) Artist / Additional Locomotion Stairs: No Architect: Yes Wheelchair Assistance: Development worker, international aid: Both upper extremities Wheelchair Parts Management: Needs assistance Distance: 50  Trunk/Postural Assessment  Cervical Assessment Cervical Assessment: Within Functional Limits Thoracic Assessment Thoracic Assessment: Within Functional Limits Lumbar Assessment Lumbar Assessment: Exceptions to WFL(posterior pelvic tilt) Postural Control Postural Control: Deficits on evaluation Righting Reactions: insufficient Postural Limitations: impaired  Balance Balance Balance Assessed: Yes Static Sitting Balance Static  Sitting - Balance Support: Bilateral upper extremity supported;Feet supported Static Sitting - Level of Assistance: 4: Min assist Dynamic Sitting Balance Dynamic Sitting - Balance Support: Bilateral upper extremity supported;Feet supported;During functional activity Dynamic Sitting - Level of Assistance: 3: Mod assist Extremity Assessment   RLE Assessment RLE Assessment: Exceptions to [facility] Passive Range of Motion (PROM) Comments: tight HS General Strength Comments: impaired, see below RLE Strength Right Hip Flexion: 2/5 Right Knee Flexion: 3-/5 Right Knee Extension: 2/5 Right Ankle Dorsiflexion: 3/5 LLE Assessment LLE Assessment: Exceptions to [facility] Passive Range of Motion (PROM) Comments: tight HS General Strength Comments: impaired, see below LLE Strength Left Hip Flexion: 2-/5 Left Knee Flexion: 2-/5 Left Knee Extension: 2-/5 Left Ankle Dorsiflexion: 2+/5    Refer to Care Plan for Long Term Goals  Recommendations for other services: Neuropsych and Therapeutic Recreation  Stress management and Outing/community reintegration  Discharge Criteria: Patient will be discharged from PT if patient refuses treatment 3 consecutive times without medical reason, if treatment goals not met, if there is a change in medical status, if patient makes no progress towards goals or if patient is discharged from hospital.  The above assessment, treatment plan, treatment alternatives and goals were discussed and mutually agreed upon: by patient   Excell Seltzer, PT, DPT 12/16/2018, 9:49 AM

## 2018-12-16 NOTE — Progress Notes (Signed)
Speech Language Pathology Note  Patient Details  Name: LENNIE HOWORTH MRN: [id number] Date of Birth: 1956/02/25 Today's Date: 12/16/2018  Received orders for ST evaluation; however, spoke with MD and orders were written in error.  Orders acknowledged and completed but no ST needs indicated at this time.     Tymir Terral, Selinda Orion 12/16/2018, 5:08 PM

## 2018-12-16 NOTE — Progress Notes (Signed)
Physical Therapy Session Note  Patient Details  Name: Paula Sutton MRN: [id number] Date of Birth: 1955/10/07  Today's Date: 12/16/2018 PT Individual Time: 1430-1530 PT Individual Time Calculation (min): 60 min   Short Term Goals: Week 1:  PT Short Term Goal 1 (Week 1): Pt will complete bed mobility with mod A consistently PT Short Term Goal 2 (Week 1): Pt will comlete least restrictive transfer with min A consistently PT Short Term Goal 3 (Week 1): Pt will propel w/c x 100 ft via least restrictive method with Supervision  Skilled Therapeutic Interventions/Progress Updates:    Pt received seated in bed, agreeable to PT session. No complaints of pain. Supine to sit with mod A for BLE management. Slide board transfer bed to w/c with mod A. Manual w/c propulsion x 100 ft with use of BUE and Supervision. Slide board transfer w/c to/from mat table with mod A, v/c for LE placement and head/hips relationship during transfer. Sit to stand x 4 reps from elevated mat table to stedy with max A. Pt is able to tolerate standing x 1 min maximum, demos good ability to maintain upright standing posture with heavy reliance on BUE. Sitting balance EOM with lightweight ball: punchouts, OH lifts, L/R diagonals x 15 reps each. Pt demos good ability to maintain sitting balance without UE support, close SBA for balance. Assisted pt back to bed at end of session. Sit to supine with mod A. Reviewed some therex pt can perform while in bed: quad sets with towel roll, heel slides with use of gait belt for AAROM of BLE. Pt left seated in bed with needs in reach, bed alarm in place at end of session.  Therapy Documentation Precautions:  Precautions Precautions: Fall Restrictions Weight Bearing Restrictions: No    Therapy/Group: Individual Therapy   Excell Seltzer, PT, DPT  12/16/2018, 3:57 PM

## 2018-12-16 NOTE — Progress Notes (Signed)
[facility] PHYSICAL MEDICINE & REHABILITATION PROGRESS NOTE   Subjective/Complaints: Pt reports having a terrible HA- thinks cold be due to not receiving her claritin as of yet this AM- is due any minute.  Also had episode of cold sweats/lightheadedness this AM when sat/stood up from lying down.  Also c/o neck muscles being really tight.  Objective:   No results found. No results for input(s): WBC, HGB, HCT, PLT in the last 72 hours. No results for input(s): NA, K, CL, CO2, GLUCOSE, BUN, CREATININE, CALCIUM in the last 72 hours.  Intake/Output Summary (Last 24 hours) at 12/16/2018 1105 Last data filed at 12/15/2018 1700 Gross per 24 hour  Intake 240 ml  Output -  Net 240 ml     Physical Exam: Vital Signs Blood pressure (!) 146/78, pulse 63, temperature 97.7 F (36.5 C), temperature source Oral, resp. rate 18, height 5\' 3"  (1.6 m), weight 76.2 kg, SpO2 96 %.  Nursing noteand vitalsreviewed. Constitutional: awake, alert, appropriate, sitting up in manual w/c, in room with OT, NAD HENT:  Head:Normocephalicand atraumatic.  Eyes:Pupils are equal, round, and reactive to light.EOMare normal.  Neck: decreased cervical flexion, rotation and side bending B/L-- tight musculature of scalenes, levators and upper traps B/L Cardiovascular:Normal rateand regular rhythm. No murmurheard. Respiratory:Effort normal. Norespiratory distress. She hasno wheezes.  [id number]. She exhibitsno distension. There isno abdominal tenderness.  Musculoskeletal:  General: No edema.  Comments: Right palm with sutures intact. Minimal tenderness to palpation. Neurological: She is alertand oriented to person, place, and time. UE 5/5 except for right wrist/grip which is limited by recent surgery. RLE: 1+ HF, KE and 3+/5 ADF/PF. LLE: tr HF, KE and 2+/5 ADF/PF. Decreased LT below the upper thigh prox to distal. Normal sensation in UE's less sl decrease in the right carpal tunnel  distribution. DTR's absent.  Skin: Skin iswarmand dry. She isnot diaphoretic.  Psychiatric: She has anormal mood and affect. Herbehavior is normal.Judgmentand thought contentnormal.        Assessment/Plan: 1. Functional deficits secondary to Guillain Barre syndrome with paraparesis  which require 3+ hours per day of interdisciplinary therapy in a comprehensive inpatient rehab setting.  Physiatrist is providing close team supervision and 24 hour management of active medical problems listed below.  Physiatrist and rehab team continue to assess barriers to discharge/monitor patient progress toward functional and medical goals  Care Tool:  Bathing              Bathing assist       Upper Body Dressing/Undressing Upper body dressing        Upper body assist      Lower Body Dressing/Undressing Lower body dressing            Lower body assist       Toileting Toileting    Toileting assist Assist for toileting: Maximal Assistance - Patient 25 - 49%     Transfers Chair/bed transfer  Transfers assist           Locomotion Ambulation   Ambulation assist              Walk 10 feet activity   Assist           Walk 50 feet activity   Assist           Walk 150 feet activity   Assist           Walk 10 feet on uneven surface  activity   Assist  Wheelchair     Assist               Wheelchair 50 feet with 2 turns activity    Assist            Wheelchair 150 feet activity     Assist          Blood pressure (!) 146/78, pulse 63, temperature 97.7 F (36.5 C), temperature source Oral, resp. rate 18, height 5\' 3"  (1.6 m), weight 76.2 kg, SpO2 96 %.   Medical Problem List and Plan: 1.Functional deficits and paraparesissecondary to GBS/?variant -admit to inpatient rehab -presentation/onset somewhat atypical for GBS. Axial scans reviewed which do not show  any signs of myelopathy. -monitor for progress in therapies. Consider f/u MRI of lumbar spine and neurology re-consult 2. Antithrombotics: -DVT/anticoagulation:Pharmaceutical:Lovenox -antiplatelet therapy: N/A 3. Pain Management:Ultram prn             -pain control appears adequate at present  10/3- will add Lidocaine patches to neck 8pm to 8am for neck tightness 4. Mood:LCSW to follow for evaluation and support. -antipsychotic agents: N/A 5. Neuropsych: This patientiscapable of making decisions onherown behalf. 6. Skin/Wound Care:Routine pressure relief measures. 7. Fluids/Electrolytes/Nutrition:Monitor I/O. Check lytes in am.  8. GERD: Continue PPI 9.Right carpel tunnel release: Will order splintgiven increased need to utilize UE's for transfer, etc. Sutures to come out Monday? 10. Bowel and bladder: -appears to be continent -monitor for patterns   LOS: 1 days A FACE TO FACE EVALUATION WAS PERFORMED  Briana Farner 12/16/2018, 11:05 AM

## 2018-12-16 NOTE — Evaluation (Signed)
Occupational Therapy Assessment and Plan  Patient Details  Name: Paula Sutton MRN: [id number] Date of Birth: 1955/04/19  OT Diagnosis: acute pain, muscle weakness (generalized), swelling of limb and paraparesis Rehab Potential: Rehab Potential (ACUTE ONLY): Excellent ELOS: 12-14 days   Today's Date: 12/16/2018 OT Individual Time: 1005-1120 OT Individual Time Calculation (min): 75 min     Problem List:  Patient Active Problem List   Diagnosis Date Noted  . Cervical myofascial pain syndrome 12/16/2018  . Carpal tunnel syndrome 12/16/2018  . Axonal GBS (Guillain-Barre syndrome) ([place]) 12/15/2018  . Leg weakness, bilateral 12/08/2018  . IBS (irritable bowel syndrome) 12/08/2018  . GERD (gastroesophageal reflux disease) 09/14/2018  . Failed total knee arthroplasty ([place]) 03/01/2018  . Abdominal pain, epigastric 06/03/2017  . Constipation 01/27/2016  . History of adenomatous polyp of colon 01/27/2016    Past Medical History:  Past Medical History:  Diagnosis Date  . Arthritis    knee  . Dyslipidemia   . GERD (gastroesophageal reflux disease)   . Left anterior fascicular block    PATIENT SAYS THIS SHOWED ON EKG DURING COLONOSCOPY , WAS REFERRED TO CARDIOLOGY DR. Minus Breeding  (SEE EPIC ENCOUNTER 09-2017)      Past Surgical History:  Past Surgical History:  Procedure Laterality Date  . ABDOMINAL HYSTERECTOMY    . BIOPSY  08/09/2017   Procedure: BIOPSY;  Surgeon: Danie Binder, MD;  Location: [facility];  Service: Endoscopy;;  gastric biopsy for h pylori  . CARPAL TUNNEL RELEASE Right 12/07/2018  . CATARACT EXTRACTION, BILATERAL     DID 1 YEAR APART , LAST ONE WAS 12-2017  . COLONOSCOPY WITH ESOPHAGOGASTRODUODENOSCOPY (EGD)  2014   [place] PROPOFOL N/A 08/09/2017   3 simple adenomas, diverticulosis in recto-sigmoid, sigmoid, and descending colin, moderate external and internal hemorrhoids  . ESOPHAGOGASTRODUODENOSCOPY (EGD) WITH PROPOFOL  N/A 08/09/2017   Low-grade narrowing Schatzki ring due to GERD; small hiatal hernia; mild gastritis and duodenitis due to NSAIDs; biopsy with gastritis  . KNEE ARTHROSCOPY Right 11/2017   WITH DR Wynelle Link  AT [place] Right   . POLYPECTOMY  08/09/2017   Procedure: POLYPECTOMY;  Surgeon: Danie Binder, MD;  Location: [facility];  Service: Endoscopy;;  cecal polyp hs, transverse colon polyp hs, rectal polyp cs  . TOTAL KNEE REVISION Right 03/01/2018   Procedure: RIGHT TOTAL KNEE REVISION;  Surgeon: Gaynelle Arabian, MD;  Location: [facility];  Service: Orthopedics;  Laterality: Right;  155mn    Assessment & Plan Clinical Impression:  Paula HAMREis a 62year old female with history of OA, GERD, recent CTR right hand who was admitted on 12/08/18 with sudden onset of BLE weakness with severe spasms and paraesthesias. [facility] spine showed mild DDD/facet disease lumbar spine, moderate right neural foraminal stenosis C7-T1 and diffuse cervical spondylosis with right foraminal stenosis C3/4 and bilateral foraminal stenosis C4/5. She was found to have leukocytosis and LP done revealing elevated protein and elevated glucose--no oligoclonal bands seen. CSF culture without organism and no growth.. She was evaluated by Dr. DMerlene Laughterand started on 5 day course of IVIG due to albuminocytologic dissociation due to GBS.  She has had some recovery in RLE but continues to have paraplegia requiring knees blocked and frequent rest breaks but respiratory status stable. CIR recommended due to functional decline.  Patient currently requires mod with basic self-care skills secondary  to muscle weakness and muscle paralysis, decreased cardiorespiratoy endurance, abnormal tone, unbalanced muscle activation and decreased coordination and decreased sitting balance and decreased postural control.  Prior to hospitalization, patient could complete BADLs with independent  .  Patient will benefit from skilled intervention to increase independence with basic self-care skills prior to discharge home with spouse.  Anticipate patient will require 24 hour supervision and follow up home health.  OT - End of Session Endurance Deficit: Yes Endurance Deficit Description: frequent rest breaks during functional activities OT Assessment Rehab Potential (ACUTE ONLY): Excellent OT Barriers to Discharge: Medical stability OT Patient demonstrates impairments in the following area(s): Balance;Edema;Endurance;Motor;Pain;Safety;Skin Integrity OT Basic ADL's Functional Problem(s): Grooming;Bathing;Dressing;Toileting OT Advanced ADL's Functional Problem(s): Simple Meal Preparation OT Transfers Functional Problem(s): Toilet;Tub/Shower OT Additional Impairment(s): None OT Plan OT Intensity: Minimum of 1-2 x/day, 45 to 90 minutes OT Frequency: 5 out of 7 days OT Duration/Estimated Length of Stay: 12-14 days OT Treatment/Interventions: Balance/vestibular training;DME/adaptive equipment instruction;Patient/family education;Therapeutic Activities;Wheelchair propulsion/positioning;Psychosocial support;Therapeutic Exercise;UE/LE Strength taining/ROM;Self Care/advanced ADL retraining;Functional mobility training;Community reintegration;Discharge planning;Neuromuscular re-education;UE/LE Coordination activities;Splinting/orthotics;Pain management;Disease mangement/prevention OT Self Feeding Anticipated Outcome(s): No goal OT Basic Self-Care Anticipated Outcome(s): Supervision/setup-Independent OT Toileting Anticipated Outcome(s): Supervision/setup OT Bathroom Transfers Anticipated Outcome(s): Supervision/setup OT Recommendation Recommendations for Other Services: Neuropsych consult Patient destination: Home Follow Up Recommendations: Home health OT Equipment Recommended: To be determined Skilled Therapeutic Intervention Skilled OT session completed with focus on initial evaluation,  education on OT role/POC, and establishment of patient-centered goals.   Pt greeted in the w/c, feeling a little lightheaded. Much improved once OT set her up with a snack. Discussed rehab process and role of OT. She did not want to get washed up today and was already dressed. However she was agreeable to complete simulate ADL tasks EOB and also attempt toileting. Mod A for all slideboard transfers with vcs for head-hip relationship. Note she had a few instances of posterior LOBs. Min A for sitting balance EOB during simulated self care with unilateral support on mattress. Utilized figure 4 to thread pants, which increased Lt hip pain. Unable to complete with the Rt due to hx TKR. Discussed how she would probably be unable to tolerate circle sitting as well, but long sitting may be ok with assist for balance. Mod A for lateral leans onto elbows, Max A for footwear and LB overall. After seated rest, she wanted to attempt BM void. OT retrieved a drop arm BSC from supplies and we discussed modified techniques. She was able to push up with handrails to clear buttocks during clothing mgt and hygiene. Note that this increased her Rt hand pain due to recent carpal tunnel release surgery. Pt then returned to the bed, reporting feeling exhausted at this point and wanting to lay back down. Mod A for scooting up towards [place] and for transition to supine. Once pt was repositioned for comfort, she was left with all needs within reach and bed alarm set.     OT Evaluation Precautions/Restrictions  Precautions Precautions: Fall Restrictions Weight Bearing Restrictions: NoFall Pain: in Rt hand with activity. She reported she would notify RN to provide pain medicine once OT left   Home Living/[place] expects to be discharged to:: Private residence Living Arrangements: Spouse/significant other Available Help at Discharge: Family, Friend(s), Available 24 hours/day Type of Home:  House Home Access: Stairs to enter [employer] of Steps: 1 Entrance Stairs-Rails: None Home Layout: One level Bathroom Shower/Tub: Multimedia programmer: Standard Bathroom Accessibility: Yes Additional Comments: Bathroom  is w/c accessible  Lives With: Spouse IADL History Homemaking Responsibilities: Yes Meal Prep Responsibility: Primary Laundry Responsibility: Primary Cleaning Responsibility: Primary Shopping Responsibility: Primary Occupation: Retired Type of Occupation: Glass blower/designer Leisure and Hobbies: She organizes her [employer] fast streamer. Loves to sew, quilt, and cook Prior Function Level of Independence: Independent with gait, Independent with transfers, Independent with homemaking with ambulation, Independent with basic ADLs  Able to Take Stairs?: Yes Driving: Yes Vocation: Volunteer work Leisure: Hobbies-yes (Comment) Comments: enjoys quilting, crafts with grandchildren ADL ADL Eating: Not assessed Grooming: Setup Where Assessed-Grooming: Wheelchair Upper Body Bathing: Minimal assistance Where Assessed-Upper Body Bathing: Edge of bed Lower Body Bathing: Maximal assistance Where Assessed-Lower Body Bathing: Edge of bed Upper Body Dressing: Minimal assistance Where Assessed-Upper Body Dressing: Edge of bed Lower Body Dressing: Maximal assistance Where Assessed-Lower Body Dressing: Edge of bed Toileting: Maximal assistance Where Assessed-Toileting: Bedside Commode Toilet Transfer: Moderate assistance Toilet Transfer Method: Theatre manager: Drop arm Geophysical data processor: Not assessed Vision Baseline Vision/History: Wears glasses Wears Glasses: Reading only Patient Visual Report: No change from baseline Vision Assessment?: No apparent visual deficits Perception  Perception: Within Functional Limits Praxis Praxis: Intact Cognition Overall Cognitive Status: Within Functional Limits for  tasks assessed Arousal/Alertness: Awake/alert Orientation Level: Person;Place;Situation Person: Oriented Place: Oriented Situation: Oriented Year: 2020 Month: October Day of Week: Correct Memory: Appears intact Immediate Memory Recall: Sock;Blue;Bed Memory Recall Sock: Without Cue Memory Recall Blue: Without Cue Memory Recall Bed: Without Cue Attention: Sustained Sustained Attention: Appears intact Awareness: Appears intact Problem Solving: Appears intact Safety/Judgment: Appears intact Sensation Sensation Light Touch: Appears Intact Proprioception: Impaired Detail Proprioception Impaired Details: Impaired RLE;Impaired LLE Coordination Gross Motor Movements are Fluid and Coordinated: No Fine Motor Movements are Fluid and Coordinated: No Coordination and Movement Description: impaired 2/2 paraplegia and recent R carpal tunnel surgery Finger Nose Finger Test: WNL B UEs Motor  Motor Motor: Paraplegia;Abnormal tone;Abnormal postural alignment and control Motor - Skilled Clinical Observations: paraplegia 2/2 GBS Mobility  Bed Mobility Bed Mobility: Rolling Right;Rolling Left;Supine to Sit;Sit to Supine Rolling Right: Minimal Assistance - Patient > 75% Rolling Left: Minimal Assistance - Patient > 75% Supine to Sit: Maximal Assistance - Patient - Patient 25-49% Sit to Supine: Maximal Assistance - Patient 25-49%  Trunk/Postural Assessment  Cervical Assessment Cervical Assessment: Within Functional Limits Thoracic Assessment Thoracic Assessment: Within Functional Limits Lumbar Assessment Lumbar Assessment: Exceptions to WFL(posterior pelvic tilt) Postural Control Postural Control: Deficits on evaluation(impaired) Balance Balance Balance Assessed: Yes Static Sitting Balance Static Sitting - Balance Support: Bilateral upper extremity supported;Feet supported Static Sitting - Level of Assistance: 4: Min assist Dynamic Sitting Balance Dynamic Sitting - Balance Support: No  upper extremity supported;Feet supported Dynamic Sitting - Level of Assistance: 4: Min assist(simulated UB self care) Dynamic Sitting - Balance Activities: Lateral lean/weight shifting;Forward lean/weight shifting Extremity/Trunk Assessment RUE Assessment RUE Assessment: Within Functional Limits Active Range of Motion (AROM) Comments: WNL, has some edema from carpal tunnel release surgery but able to use Rt hand functionally. LUE Assessment LUE Assessment: Within Functional Limits Active Range of Motion (AROM) Comments: WNL     Refer to Care Plan for Long Term Goals  Recommendations for other services: Neuropsych   Discharge Criteria: Patient will be discharged from OT if patient refuses treatment 3 consecutive times without medical reason, if treatment goals not met, if there is a change in medical status, if patient makes no progress towards goals or if patient is discharged from hospital.  The above assessment,  treatment plan, treatment alternatives and goals were discussed and mutually agreed upon: by patient  Skeet Simmer 12/16/2018, 12:13 PM

## 2018-12-17 MED ORDER — MAGNESIUM CITRATE PO SOLN
1.0000 | Freq: Once | ORAL | Status: AC
  Administered 2018-12-17: 1 via ORAL
  Filled 2018-12-17 (×2): qty 296

## 2018-12-17 NOTE — Progress Notes (Signed)
Received in report from previous nurse that sutures were removed but one was deep & could not be taken out. Area is swollen & bruised, having a minimal amount of bloody drainage. Dressing was changed & will pass on to oncoming nurse & provider in the morning. No c/o pain to the site. No signs of distress noted

## 2018-12-17 NOTE — Progress Notes (Addendum)
[place] PHYSICAL MEDICINE & REHABILITATION PROGRESS NOTE   Subjective/Complaints: Pt reports got her CTS splint for night time- LBM was Wed/Thursday this past week- needs to go.  Wants sutures out of R wrist- also wants to change lidoderm patches to "hips".   Also c/o neck muscles being really tight.  ROS- pt denies SOB, CP, N/V/D.   Objective:   No results found. No results for input(s): WBC, HGB, HCT, PLT in the last 72 hours. No results for input(s): NA, K, CL, CO2, GLUCOSE, BUN, CREATININE, CALCIUM in the last 72 hours.  Intake/Output Summary (Last 24 hours) at 12/17/2018 1059 Last data filed at 12/17/2018 0752 Gross per 24 hour  Intake 480 ml  Output -  Net 480 ml     Physical Exam: Vital Signs Blood pressure (!) 131/99, pulse 71, temperature 97.9 F (36.6 C), temperature source Oral, resp. rate 18, height 5\' 3"  (1.6 m), weight 76.2 kg, SpO2 97 %.  Nursing noteand vitalsreviewed. Constitutional: awake, alert, appropriate; lying in bed; in room , NAD HENT:  Head:Normocephalicand atraumatic.  Eyes:Pupils are equal, round, and reactive to light.EOMare normal.  Neck: decreased cervical flexion, rotation and side bending B/L-- tight musculature of scalenes, levators and upper traps B/L Cardiovascular:Normal rateand regular rhythm. No murmurheard. Respiratory:Effort normal. Norespiratory distress. She hasno wheezes.  [id number]. She exhibitsno distension. There isno abdominal tenderness.  Musculoskeletal:  General: No edema.  Comments: Right palm with sutures intact. Minimal tenderness to palpation.a little dried blood- no drainage; mild edema; no erythema- wearing CTS night splint.  Neurological: She is alertand oriented to person, place, and time. UE 5/5 except for right wrist/grip which is limited by recent surgery. RLE: 1+ HF, KE and 3+/5 ADF/PF. LLE: tr HF, KE and 2+/5 ADF/PF. Decreased LT below the upper thigh prox to distal. Normal  sensation in UE's less sl decrease in the right carpal tunnel distribution. DTR's absent.  Skin: Skin iswarmand dry. She isnot diaphoretic.  Psychiatric: She has anormal mood and affect. Herbehavior is normal.Judgmentand thought contentnormal.        Assessment/Plan: 1. Functional deficits secondary to Guillain Barre syndrome with paraparesis  which require 3+ hours per day of interdisciplinary therapy in a comprehensive inpatient rehab setting.  Physiatrist is providing close team supervision and 24 hour management of active medical problems listed below.  Physiatrist and rehab team continue to assess barriers to discharge/monitor patient progress toward functional and medical goals  Care Tool:  Bathing    Body parts bathed by patient: Front perineal area, Right arm, Chest, Left arm, Abdomen, Right upper leg, Left upper leg, Face   Body parts bathed by helper: Buttocks, Right lower leg, Left lower leg(back)     Bathing assist Assist Level: Moderate Assistance - Patient 50 - 74%     Upper Body Dressing/Undressing Upper body dressing   What is the patient wearing?: Pull over shirt    Upper body assist Assist Level: Independent    Lower Body Dressing/Undressing Lower body dressing      What is the patient wearing?: Underwear/pull up, Pants     Lower body assist Assist for lower body dressing: Moderate Assistance - Patient 50 - 74%     Toileting Toileting    Toileting assist Assist for toileting: Maximal Assistance - Patient 25 - 49%     Transfers Chair/bed transfer  Transfers assist     Chair/bed transfer assist level: Moderate Assistance - Patient 50 - 74%     Locomotion Ambulation   Ambulation assist  Ambulation activity did not occur: Safety/medical concerns          Walk 10 feet activity   Assist  Walk 10 feet activity did not occur: Safety/medical concerns        Walk 50 feet activity   Assist Walk 50 feet with 2 turns  activity did not occur: Safety/medical concerns         Walk 150 feet activity   Assist Walk 150 feet activity did not occur: Safety/medical concerns         Walk 10 feet on uneven surface  activity   Assist Walk 10 feet on uneven surfaces activity did not occur: Safety/medical concerns         Wheelchair     Assist Will patient use wheelchair at discharge?: Yes Type of Wheelchair: Manual    Wheelchair assist level: Contact Guard/Touching assist Max wheelchair distance: 16'    Wheelchair 50 feet with 2 turns activity    Assist        Assist Level: Contact Guard/Touching assist   Wheelchair 150 feet activity     Assist  Wheelchair 150 feet activity did not occur: Safety/medical concerns       Blood pressure (!) 131/99, pulse 71, temperature 97.9 F (36.6 C), temperature source Oral, resp. rate 18, height 5\' 3"  (1.6 m), weight 76.2 kg, SpO2 97 %.   Medical Problem List and Plan: 1.Functional deficits and paraparesissecondary to GBS/?variant -admit to inpatient rehab -presentation/onset somewhat atypical for GBS. Axial scans reviewed which do not show any signs of myelopathy. -monitor for progress in therapies. Consider f/u MRI of lumbar spine and neurology re-consult 2. Antithrombotics: -DVT/anticoagulation:Pharmaceutical:Lovenox -antiplatelet therapy: N/A 3. Pain Management:Ultram prn             -pain control appears adequate at present  10/3- will add Lidocaine patches to neck 8pm to 8am for neck tightness  10/4- will change lidoderm patches to hips per pt request.  4. Mood:LCSW to follow for evaluation and support. -antipsychotic agents: N/A 5. Neuropsych: This patientiscapable of making decisions onherown behalf. 6. Skin/Wound Care:Routine pressure relief measures. 7. Fluids/Electrolytes/Nutrition:Monitor I/O. Check lytes in am.  8. GERD: Continue  PPI 9.Right carpel tunnel release: Will order splintgiven increased need to utilize UE's for transfer, etc. Sutures to come out Monday?  10/4- will write for sutures to be removed Monday  10. Bowel and bladder: -appears to be continent -monitor for patterns  10/4- will give Mg citrate- can follow with suppository if needed.- no BM since Wed/Thursday.  LOS: 2 days A FACE TO FACE EVALUATION WAS PERFORMED  Paula Sutton 12/17/2018, 10:59 AM

## 2018-12-17 NOTE — Progress Notes (Signed)
RN attempted to remove sutures from wrist. RN removed all but one stitch and was unsuccessful after many attempts. Ice applied to wrist to help decrease swelling around incision site but final stitch appears too deep to get scissors under. Will pass this information to night shift rn.

## 2018-12-18 ENCOUNTER — Inpatient Hospital Stay (HOSPITAL_COMMUNITY): Payer: 59 | Admitting: Occupational Therapy

## 2018-12-18 ENCOUNTER — Inpatient Hospital Stay (HOSPITAL_COMMUNITY): Payer: 59

## 2018-12-18 LAB — COMPREHENSIVE METABOLIC PANEL
ALT: 102 U/L — ABNORMAL HIGH (ref 0–44)
AST: 108 U/L — ABNORMAL HIGH (ref 15–41)
Albumin: 3.1 g/dL — ABNORMAL LOW (ref 3.5–5.0)
Alkaline Phosphatase: 74 U/L (ref 38–126)
Anion gap: 7 (ref 5–15)
BUN: 17 mg/dL (ref 8–23)
CO2: 29 mmol/L (ref 22–32)
Calcium: 9.6 mg/dL (ref 8.9–10.3)
Chloride: 98 mmol/L (ref 98–111)
Creatinine, Ser: 0.85 mg/dL (ref 0.44–1.00)
GFR calc Af Amer: >60 mL/min (ref >60)
GFR calc non Af Amer: >60 mL/min (ref >60)
Glucose, Bld: 103 mg/dL — ABNORMAL HIGH (ref 70–99)
Potassium: 5 mmol/L (ref 3.5–5.1)
Sodium: 134 mmol/L — ABNORMAL LOW (ref 135–145)
Total Bilirubin: 0.8 mg/dL (ref 0.3–1.2)
Total Protein: 8.6 g/dL — ABNORMAL HIGH (ref 6.5–8.1)

## 2018-12-18 LAB — CBC
HCT: 45.3 % (ref 36.0–46.0)
Hemoglobin: 14.7 g/dL (ref 12.0–15.0)
MCH: 29.4 pg (ref 26.0–34.0)
MCHC: 32.5 g/dL (ref 30.0–36.0)
MCV: 90.6 fL (ref 80.0–100.0)
Platelets: 250 10*3/uL (ref 150–400)
RBC: 5 MIL/uL (ref 3.87–5.11)
RDW: 14.1 % (ref 11.5–15.5)
WBC: 4.9 10*3/uL (ref 4.0–10.5)
nRBC: 0 % (ref 0.0–0.2)

## 2018-12-18 MED ORDER — ACETAMINOPHEN 325 MG PO TABS: 325.0000 mg | ORAL_TABLET | ORAL | Status: DC | PRN

## 2018-12-18 NOTE — Progress Notes (Signed)
Physical Therapy Session Note  Patient Details  Name: Paula Sutton MRN: [id number] Date of Birth: [birth year]  Today's Date: 12/18/2018 PT Individual Time: 1420-1535 PT Individual Time Calculation (min): 75 min   Short Term Goals: Week 1:  PT Short Term Goal 1 (Week 1): Pt will complete bed mobility with mod A consistently PT Short Term Goal 2 (Week 1): Pt will comlete least restrictive transfer with min A consistently PT Short Term Goal 3 (Week 1): Pt will propel w/c x 100 ft via least restrictive method with Supervision  Skilled Therapeutic Interventions/Progress Updates:   Pt seated in recliner.  She had just received her lunch, and was feeling weak.  Session started after pt had eaten her sherbet.  Seated Therapeutic exercises performed with trunk and LEs to increase strength for functional mobility: 10 x 1 bil scapular adduction, trunk extension/flexion without use of UEs.  Slide board transfer with set-up and CGA/supervision.   neuromuscular re-education via multimodal cues, wt bearing, forced use for alternating reciprocal movement bil LEs, using Kinetron from w/c level, at resistance 70 cm/sec x 25 cycles targeting gluteals.  L>R hip extension noted; x 10 cycles x 2 targeting quadriceps muscles with R>L minimal activation noted.   W/c propulsion on 50' x 2 on level tile, wearing w/c gloves, supervision and cues for turns and efficiency.  Cues for locking/unlocking brakes.    Use of static stander x 15 minutes.  With assistance and use of bil UEs, terminal hip extension x 10 x 1, lateral wt shifting x 10 x 2.  At end of session, pt in recliner with needs at hand and husband present.      Therapy Documentation Precautions:  Precautions Precautions: Fall Restrictions Weight Bearing Restrictions: No     Pain: 6/10 bil lower abdomen, premedicated      Therapy/Group: Individual Therapy  Breauna Mazzeo 12/18/2018, 4:29 PM

## 2018-12-18 NOTE — Progress Notes (Signed)
Social Work Assessment and Plan   Patient Details  Name: Paula Sutton MRN: [id number] Date of Birth: 11-26-1955  Today's Date: 12/18/2018  Problem List:  Patient Active Problem List   Diagnosis Date Noted  . Cervical myofascial pain syndrome 12/16/2018  . Carpal tunnel syndrome 12/16/2018  . Axonal GBS (Guillain-Barre syndrome) ([place]) 12/15/2018  . Leg weakness, bilateral 12/08/2018  . IBS (irritable bowel syndrome) 12/08/2018  . GERD (gastroesophageal reflux disease) 09/14/2018  . Failed total knee arthroplasty ([place]) 03/01/2018  . Abdominal pain, epigastric 06/03/2017  . Constipation 01/27/2016  . History of adenomatous polyp of colon 01/27/2016   Past Medical History:  Past Medical History:  Diagnosis Date  . Arthritis    knee  . Dyslipidemia   . GERD (gastroesophageal reflux disease)   . Left anterior fascicular block    PATIENT SAYS THIS SHOWED ON EKG DURING COLONOSCOPY , WAS REFERRED TO CARDIOLOGY DR. Minus Breeding  (SEE EPIC ENCOUNTER 09-2017)      Past Surgical History:  Past Surgical History:  Procedure Laterality Date  . ABDOMINAL HYSTERECTOMY    . BIOPSY  08/09/2017   Procedure: BIOPSY;  Surgeon: Danie Binder, MD;  Location: [facility];  Service: Endoscopy;;  gastric biopsy for h pylori  . CARPAL TUNNEL RELEASE Right 12/07/2018  . CATARACT EXTRACTION, BILATERAL     DID 1 YEAR APART , LAST ONE WAS 12-2017  . COLONOSCOPY WITH ESOPHAGOGASTRODUODENOSCOPY (EGD)  2014   [place] PROPOFOL N/A 08/09/2017   3 simple adenomas, diverticulosis in recto-sigmoid, sigmoid, and descending colin, moderate external and internal hemorrhoids  . ESOPHAGOGASTRODUODENOSCOPY (EGD) WITH PROPOFOL N/A 08/09/2017   Low-grade narrowing Schatzki ring due to GERD; small hiatal hernia; mild gastritis and duodenitis due to NSAIDs; biopsy with gastritis  . KNEE ARTHROSCOPY Right 11/2017   WITH DR Wynelle Link  AT [place] Right   .  POLYPECTOMY  08/09/2017   Procedure: POLYPECTOMY;  Surgeon: Danie Binder, MD;  Location: [facility];  Service: Endoscopy;;  cecal polyp hs, transverse colon polyp hs, rectal polyp cs  . TOTAL KNEE REVISION Right 03/01/2018   Procedure: RIGHT TOTAL KNEE REVISION;  Surgeon: Gaynelle Arabian, MD;  Location: [facility];  Service: Orthopedics;  Laterality: Right;  119min   Social History:  reports that she quit smoking about 47 years ago. Her smoking use included cigarettes. She has never used smokeless tobacco. She reports current alcohol use. She reports current drug use. Frequency: 7.00 times per week. Drug: Marijuana.  Family / Support Systems Marital Status: Married Patient Roles: Spouse Spouse/Significant Other: spouse, Calypso Casasanta @ [phone] Children: they each have adult children all living out of state Anticipated Caregiver: husband Ability/Limitations of Caregiver: Min A Caregiver Availability: 24/7 Family Dynamics: Pt notes her spouse is very supportive and able to be with her at d/c.  Good support from adult children, however, none living locally.  Social History Preferred language: English Religion: Baptist Cultural Background: NA Read: Yes Write: Yes Employment Status: Retired Public relations account executive Issues: [employer]One Guardian/Conservator: None - per MD, pt is capable of making decisions on her own behalf.   Abuse/Neglect Abuse/Neglect Assessment Can Be Completed: Yes Physical Abuse: Denies Verbal Abuse: Denies Sexual Abuse: Denies Exploitation of patient/patient's resources: Denies Self-Neglect: Denies  Emotional Status Pt's affect, behavior and adjustment status: Pt very pleasant and able to complete assessment interview without any difficulty.  She admits much frustration with her deficits and loss  of function.  Concern with how long until she has reached a level of being able to ambulate. She denies any significant emotional distress, hwoever, states, "that  doesn't mean I haven't had my down moments."  Will monitor mood and refer for neuropsychology as indicated. Recent Psychosocial Issues: none Psychiatric History: none Substance Abuse History: none  Patient / Family Perceptions, Expectations & Goals Pt/Family understanding of illness & functional limitations: Pt and spouse with general understanding of her GBS diagnosis.  She notes she had never heard of the illness but is learning what she can. Premorbid pt/family roles/activities: Completely independent PTA Anticipated changes in roles/activities/participation: Per goals of w/c level, spouse will need to assume some caregiver responsibilities. Pt/family expectations/goals: "I just want to be able to do as much as I can for myself."  [employer]: None Premorbid Home Care/DME Agencies: None Transportation available at discharge: yes Resource referrals recommended: Neuropsychology  Discharge Planning Living Arrangements: Spouse/significant other Support Systems: Spouse/significant other, Church/faith community Type of Residence: Private residence Insurance Resources: Multimedia programmer (specify), Medicare Financial Resources: Social Security Financial Screen Referred: No Living Expenses: Own Money Management: Spouse Does the patient have any problems obtaining your medications?: No Home Management: pt and spouse Patient/Family Preliminary Plans: Pt to return home with spouse who, along with friends, will provide any need assistance. Social Work Anticipated Follow Up Needs: HH/OP Expected length of stay: 10-14 days  Clinical Impression Very pleasant woman here with GBS and significant LE impairment.  She is very motivated and relying on her faith and support of spouse and church to manage the stress of this event.  Motivated for therapies but does admit, "I have had my down times, too."  Will follow for support and d/c planning needs.  Anali Cabanilla 12/18/2018,  3:42 PM

## 2018-12-18 NOTE — Plan of Care (Signed)
  Problem: Consults Goal: RH GENERAL PATIENT EDUCATION Description: See Patient Education module for education specifics. Outcome: Progressing Goal: Skin Care Protocol Initiated - if Braden Score 18 or less Description: If consults are not indicated, leave blank or document N/A Outcome: Progressing   Problem: RH SKIN INTEGRITY Goal: RH STG SKIN FREE OF INFECTION/BREAKDOWN Description: Patients skin will remain free from further breakdown or infection with mod I assist. Outcome: Progressing   Problem: RH SAFETY Goal: RH STG ADHERE TO SAFETY PRECAUTIONS W/ASSISTANCE/DEVICE Description: STG Adhere to Safety Precautions With Mod I Assistance/Device. Outcome: Progressing   Problem: RH PAIN MANAGEMENT Goal: RH STG PAIN MANAGED AT OR BELOW PT'S PAIN GOAL Description: < 3 Outcome: Progressing

## 2018-12-18 NOTE — Progress Notes (Signed)
[facility] PHYSICAL MEDICINE & REHABILITATION PROGRESS NOTE   Subjective/Complaints:   Bowels have moved,  Right CTS sutures difficult to remove, had surgery in [place]  ROS- pt denies SOB, CP, N/V/D.   Objective:   No results found. Recent Labs    12/18/18 0539  WBC 4.9  HGB 14.7  HCT 45.3  PLT 250   Recent Labs    12/18/18 0539  NA 134*  K 5.0  CL 98  CO2 29  GLUCOSE 103*  BUN 17  CREATININE 0.85  CALCIUM 9.6    Intake/Output Summary (Last 24 hours) at 12/18/2018 0856 Last data filed at 12/18/2018 0802 Gross per 24 hour  Intake 696 ml  Output -  Net 696 ml     Physical Exam: Vital Signs Blood pressure 138/66, pulse 69, temperature 98 F (36.7 C), temperature source Oral, resp. rate 18, height 5\' 3"  (1.6 m), weight 76.2 kg, SpO2 99 %.  Nursing noteand vitalsreviewed. Constitutional: awake, alert, appropriate; lying in bed; in room , NAD HENT:  Head:Normocephalicand atraumatic.  Eyes:Pupils are equal, round, and reactive to light.EOMare normal.  Neck: decreased cervical flexion, rotation and side bending B/L-- tight musculature of scalenes, levators and upper traps B/L Cardiovascular:Normal rateand regular rhythm. No murmurheard. Respiratory:Effort normal. Norespiratory distress. She hasno wheezes.  [id number]. She exhibitsno distension. There isno abdominal tenderness.  Musculoskeletal:  General: No edema.  Comments: Right palm with sutures intact. Minimal tenderness to palpation.a little dried blood- no drainage; mild edema; no erythema- wearing CTS night splint.  Neurological: She is alertand oriented to person, place, and time. UE 5/5 except for right wrist/grip which is limited by recent surgery. RLE: 1+ HF, KE and 3+/5 ADF/PF. LLE: tr HF, KE and 2+/5 ADF/PF. Decreased LT below the upper thigh prox to distal. Normal sensation in UE's less sl decrease in the right carpal tunnel distribution. DTR's absent.  Skin: Skin  iswarmand dry. She isnot diaphoretic.  Psychiatric: She has anormal mood and affect. Herbehavior is normal.Judgmentand thought contentnormal.        Assessment/Plan: 1. Functional deficits secondary to Guillain Barre syndrome with paraparesis  which require 3+ hours per day of interdisciplinary therapy in a comprehensive inpatient rehab setting.  Physiatrist is providing close team supervision and 24 hour management of active medical problems listed below.  Physiatrist and rehab team continue to assess barriers to discharge/monitor patient progress toward functional and medical goals  Care Tool:  Bathing    Body parts bathed by patient: Front perineal area, Right arm, Chest, Left arm, Abdomen, Right upper leg, Left upper leg, Face   Body parts bathed by helper: Buttocks, Right lower leg, Left lower leg(back)     Bathing assist Assist Level: Moderate Assistance - Patient 50 - 74%     Upper Body Dressing/Undressing Upper body dressing   What is the patient wearing?: Pull over shirt    Upper body assist Assist Level: Independent    Lower Body Dressing/Undressing Lower body dressing      What is the patient wearing?: Underwear/pull up, Pants     Lower body assist Assist for lower body dressing: Moderate Assistance - Patient 50 - 74%     Toileting Toileting    Toileting assist Assist for toileting: Moderate Assistance - Patient 50 - 74%     Transfers Chair/bed transfer  Transfers assist     Chair/bed transfer assist level: Moderate Assistance - Patient 50 - 74%     Locomotion Ambulation   Ambulation assist  Ambulation activity did not occur: Safety/medical concerns          Walk 10 feet activity   Assist  Walk 10 feet activity did not occur: Safety/medical concerns        Walk 50 feet activity   Assist Walk 50 feet with 2 turns activity did not occur: Safety/medical concerns         Walk 150 feet activity   Assist Walk 150  feet activity did not occur: Safety/medical concerns         Walk 10 feet on uneven surface  activity   Assist Walk 10 feet on uneven surfaces activity did not occur: Safety/medical concerns         Wheelchair     Assist Will patient use wheelchair at discharge?: Yes Type of Wheelchair: Manual    Wheelchair assist level: Contact Guard/Touching assist Max wheelchair distance: 63'    Wheelchair 50 feet with 2 turns activity    Assist        Assist Level: Contact Guard/Touching assist   Wheelchair 150 feet activity     Assist  Wheelchair 150 feet activity did not occur: Safety/medical concerns       Blood pressure 138/66, pulse 69, temperature 98 F (36.7 C), temperature source Oral, resp. rate 18, height 5\' 3"  (1.6 m), weight 76.2 kg, SpO2 99 %.   Medical Problem List and Plan: 1.Functional deficits and paraparesissecondary to GBS/?variant CIR PT, OT  -presentation/onset somewhat atypical for GBS. Axial scans reviewed which do not show any signs of myelopathy. -monitor for progress in therapies. Consider f/u MRI of lumbar spine and neurology re-consult 2. Antithrombotics: -DVT/anticoagulation:Pharmaceutical:Lovenox -antiplatelet therapy: N/A 3. Pain Management:Ultram prn             -pain control appears adequate at present  10/3- will add Lidocaine patches to neck 8pm to 8am for neck tightness  10/4- will change lidoderm patches to hips per pt request.  4. Mood:LCSW to follow for evaluation and support. -antipsychotic agents: N/A 5. Neuropsych: This patientiscapable of making decisions onherown behalf. 6. Skin/Wound Care:Routine pressure relief measures.Difficulty with suture removal- surgeon in [place] Defer to primary service  7. Fluids/Electrolytes/Nutrition:Monitor I/O. Check lytes in am.  8. GERD: Continue PPI 9.Right carpel tunnel release: Will order splintgiven  increased need to utilize UE's for transfer, etc. Sutures to come out Monday?  10/4- will write for sutures to be removed Monday  10. Bowel and bladder: -appears to be continent -improved after metamucil   LOS: 3 days A FACE TO FACE EVALUATION WAS PERFORMED  Charlett Blake 12/18/2018, 8:56 AM

## 2018-12-18 NOTE — Progress Notes (Signed)
Occupational Therapy Session Note  Patient Details  Name: Paula Sutton MRN: [id number] Date of Birth: [birth year]  Today's Date: 12/18/2018 OT Individual Time: [id number] and 1100-1200 OT Individual Time Calculation (min): 60 min and 60 min   Short Term Goals: Week 1:  OT Short Term Goal 1 (Week 1): Pt will complete 1/3 components of donning pants using adaptive techniques/AE as needed OT Short Term Goal 2 (Week 1): Pt will complete BSC transfer with Min A and LRAD OT Short Term Goal 3 (Week 1): Pt will complete LB bathing with Mod A using adaptive techniques/AE as needed  Skilled Therapeutic Interventions/Progress Updates:    Session One: Pt seen for OT session focusing on functional transfers and ADL re-training. Pt in supine upon arrival, denying pain and agreeable to tx session.  Transfers: Supine>sitting EOB with min A and VCs for log rolling technique using bed rails. Min A sliding board transfers EOB>drop arm BSC and drop arm BSC> to w/c. Sit>stand in STEDY with mod A to power into standing and mod A for controlled descent to sit- practiced STEDY as transfer method/toileting method to use with nursing staff if pt desires for time and energy conservation. .  ADL re-training: toileting tasks on BSC with overall min A, VCs and guarding assist for lateral leans onto EOB and into w/c in order to complete clothing management and hygiene. Min A required to pull pants fully up in the back/middle. Grooming tasks from w/c level at sink mod I. Pt transitioned to recliner and left seated with all needs in reach.   Session Two: Pt seen for OT session focusing on functional mobility and LB stretching. Pt sitting up in recliner upon arrival, denied pain and agreeable to tx session.  Donned socks/shoes seated in recliner. Assist to obtain and maintain figure four sitting position while pt donned socks/shoes, assist to tie shoes. Pt with complaints of hip flexor tightness and provided with various stretches  she could complete independently within chair/bed.  Min A sliding board transfer to w/c. She self propelled w/c throughout unit with supervision and VCs for effective w/c propulsion technique. Education and demonstration provided for w/c parts management and managing w/c parts in prep for set-up of transfer. She transferred onto therapy mat and positioned in supine. Completed LE stretching of hip flexor, quad, hamstring and calf to R/L to pt tolerance.  Pt positioned in prone on mat with prolonged hip flexor stretch, pt very much liking this position. Transitioned back to supine and completed supine>long sitting with supervision following demonstration for technique. Education provided regarding functional use of long sitting and circle sitting position for ADL tasks. From semi-circle sit position, pt able to don/doff B shoes/socks and apply lotion with supervision.  Pt returned to room at end of session, transitioned back to recliner with min A/VCs for technique. Left with all needs in reach.   Therapy Documentation Precautions:  Precautions Precautions: Fall Restrictions Weight Bearing Restrictions: No   Therapy/Group: Individual Therapy  Dayle Sherpa L 12/18/2018, 7:03 AM

## 2018-12-18 NOTE — Progress Notes (Signed)
Inpatient Rehabilitation  Patient information reviewed and entered into eRehab system by Jahmiyah Dullea M. Perina Salvaggio, M.A., CCC/SLP, PPS Coordinator.  Information including medical coding, functional ability and quality indicators will be reviewed and updated through discharge.    

## 2018-12-19 ENCOUNTER — Inpatient Hospital Stay (HOSPITAL_COMMUNITY): Payer: 59 | Admitting: Occupational Therapy

## 2018-12-19 ENCOUNTER — Inpatient Hospital Stay (HOSPITAL_COMMUNITY): Payer: 59 | Admitting: Physical Therapy

## 2018-12-19 LAB — BASIC METABOLIC PANEL
Anion gap: 10 (ref 5–15)
BUN: 19 mg/dL (ref 8–23)
CO2: 27 mmol/L (ref 22–32)
Calcium: 9.7 mg/dL (ref 8.9–10.3)
Chloride: 100 mmol/L (ref 98–111)
Creatinine, Ser: 0.81 mg/dL (ref 0.44–1.00)
GFR calc Af Amer: >60 mL/min (ref >60)
GFR calc non Af Amer: >60 mL/min (ref >60)
Glucose, Bld: 102 mg/dL — ABNORMAL HIGH (ref 70–99)
Potassium: 4.9 mmol/L (ref 3.5–5.1)
Sodium: 137 mmol/L (ref 135–145)

## 2018-12-19 NOTE — Progress Notes (Signed)
Physical Therapy Session Note  Patient Details  Name: Paula Sutton MRN: [id number] Date of Birth: 1955/06/21  Today's Date: 12/19/2018 PT Individual Time: 0800-0900 PT Individual Time Calculation (min): 60 min   Short Term Goals: Week 1:  PT Short Term Goal 1 (Week 1): Pt will complete bed mobility with mod A consistently PT Short Term Goal 2 (Week 1): Pt will comlete least restrictive transfer with min A consistently PT Short Term Goal 3 (Week 1): Pt will propel w/c x 100 ft via least restrictive method with Supervision  Skilled Therapeutic Interventions/Progress Updates:    Pt received seated in bed, agreeable to PT session. Pt reports soreness in lateral hips/abdominal region, not rated and reports being premedicated prior to start of therapy session. Supine to long sit and circle sit with use of B bedrails and min A for trunk control. Pt is CGA to don socks and shoes via circle sit positioning, unable to tie laces without assist. Long sit to sitting EOB with mod A for BLE management. Slide board transfer bed to w/c with CGA. Manual w/c propulsion x 150 ft with use of BUE and Supervision. Reviewed management of w/c parts with min A for management of w/c leg rests. Sit to stand in standing frame x 3 reps. Focus on upright standing with decreased UE support as well as hip extension with mod manual cueing for upright posture. Standing mini-squats x 10 reps with decreased support from standing frame sling. Pt fatigues quickly with standing and is unable to elicit quad activation with second bout of standing. Slide board transfer w/c to/from mat table with setup A to CGA. Sit to supine mod A for BLE management. Attempt supine bridges, pt unable to clear buttocks, glute set x 5 reps to fatigue. Supine SKFO x 5 reps B with AAROM for LE control. Supine SAQ x 10 reps with AAROM, trace quad activation. Supine to sit with max A for BLE management and trunk control. Pt left seated in w/c in room with needs in  reach at end of session.  Therapy Documentation Precautions:  Precautions Precautions: Fall Restrictions Weight Bearing Restrictions: No   Therapy/Group: Individual Therapy   Excell Seltzer, PT, DPT  12/19/2018, 12:24 PM

## 2018-12-19 NOTE — Plan of Care (Signed)
  Problem: Consults Goal: RH GENERAL PATIENT EDUCATION Description: See Patient Education module for education specifics. Outcome: Progressing Goal: Skin Care Protocol Initiated - if Braden Score 18 or less Description: If consults are not indicated, leave blank or document N/A Outcome: Progressing   Problem: RH SKIN INTEGRITY Goal: RH STG SKIN FREE OF INFECTION/BREAKDOWN Description: Patients skin will remain free from further breakdown or infection with mod I assist. Outcome: Progressing   Problem: RH SAFETY Goal: RH STG ADHERE TO SAFETY PRECAUTIONS W/ASSISTANCE/DEVICE Description: STG Adhere to Safety Precautions With Mod I Assistance/Device. Outcome: Progressing   Problem: RH PAIN MANAGEMENT Goal: RH STG PAIN MANAGED AT OR BELOW PT'S PAIN GOAL Description: < 3 Outcome: Progressing

## 2018-12-19 NOTE — Progress Notes (Signed)
[facility] PHYSICAL MEDICINE & REHABILITATION PROGRESS NOTE   Subjective/Complaints:   Sides still bothering her- lidoderm patches help "some". Just exhausted today- from doing so much therapy yesterday- we discussed how sometimes pushing too hard can make GBS pts have a setback- so push, but if she notices feeling weak afterwards, push a little less.  Got CTS surgery in [place], [place]- sutures not out.   ROS- pt denies SOB, CP, N/V/D.   Objective:   No results found. Recent Labs    12/18/18 0539  WBC 4.9  HGB 14.7  HCT 45.3  PLT 250   Recent Labs    12/18/18 0539 12/19/18 0447  NA 134* 137  K 5.0 4.9  CL 98 100  CO2 29 27  GLUCOSE 103* 102*  BUN 17 19  CREATININE 0.85 0.81  CALCIUM 9.6 9.7    Intake/Output Summary (Last 24 hours) at 12/19/2018 1235 Last data filed at 12/19/2018 0819 Gross per 24 hour  Intake 640 ml  Output -  Net 640 ml     Physical Exam: Vital Signs Blood pressure 135/82, pulse 72, temperature 97.9 F (36.6 C), temperature source Oral, resp. rate 19, height 5\' 3"  (1.6 m), weight 76.2 kg, SpO2 100 %.  Nursing note; labsand vitalsreviewed. Constitutional: awake, alert, appropriate; lying in bed; in room , NAD HENT:  Head:Normocephalicand atraumatic.  Eyes: conjugate gaze.EOMare normal.  Neck: decreased cervical flexion, rotation and side bending B/L-- tight musculature of scalenes, levators and upper traps B/L-a  little better than last week  Cardiovascular:Normal rateand regular rhythm. No murmurheard. Respiratory:Effort normal. Norespiratory distress. She hasno wheezes.  [id number]. She exhibitsno distension. There isno abdominal tenderness.  Musculoskeletal:  General: No edema.  Comments: Right palm with sutures intact. Minimal tenderness to palpation.a little dried blood- no drainage; mild edema; no erythema- wearing CTS night splint.  Neurological: She is alertand oriented to person, place, and time. UE 5/5  except for right wrist/grip which is limited by recent surgery. RLE: 1+ HF, KE and 3+/5 ADF/PF. LLE: tr HF, KE and 2+/5 ADF/PF. Decreased LT below the upper thigh prox to distal. Normal sensation in UE's less sl decrease in the right carpal tunnel distribution. DTR's absent.  Skin: Skin iswarmand dry. She isnot diaphoretic.  Psychiatric: She has anormal mood and affect. Herbehavior is normal.Judgmentand thought contentnormal.        Assessment/Plan: 1. Functional deficits secondary to Guillain Barre syndrome with paraparesis  which require 3+ hours per day of interdisciplinary therapy in a comprehensive inpatient rehab setting.  Physiatrist is providing close team supervision and 24 hour management of active medical problems listed below.  Physiatrist and rehab team continue to assess barriers to discharge/monitor patient progress toward functional and medical goals  Care Tool:  Bathing    Body parts bathed by patient: Front perineal area, Right arm, Chest, Left arm, Abdomen, Right upper leg, Left upper leg, Face   Body parts bathed by helper: Buttocks, Right lower leg, Left lower leg(back)     Bathing assist Assist Level: Moderate Assistance - Patient 50 - 74%     Upper Body Dressing/Undressing Upper body dressing   What is the patient wearing?: Pull over shirt    Upper body assist Assist Level: Minimal Assistance - Patient > 75%    Lower Body Dressing/Undressing Lower body dressing      What is the patient wearing?: Underwear/pull up, Pants     Lower body assist Assist for lower body dressing: Maximal Assistance - Patient 25 - 49%  Toileting Toileting    Toileting assist Assist for toileting: Moderate Assistance - Patient 50 - 74%     Transfers Chair/bed transfer  Transfers assist     Chair/bed transfer assist level: Contact Guard/Touching assist     Locomotion Ambulation   Ambulation assist   Ambulation activity did not occur:  Safety/medical concerns          Walk 10 feet activity   Assist  Walk 10 feet activity did not occur: Safety/medical concerns        Walk 50 feet activity   Assist Walk 50 feet with 2 turns activity did not occur: Safety/medical concerns         Walk 150 feet activity   Assist Walk 150 feet activity did not occur: Safety/medical concerns         Walk 10 feet on uneven surface  activity   Assist Walk 10 feet on uneven surfaces activity did not occur: Safety/medical concerns         Wheelchair     Assist Will patient use wheelchair at discharge?: Yes Type of Wheelchair: Manual    Wheelchair assist level: Supervision/Verbal cueing Max wheelchair distance: 150'    Wheelchair 50 feet with 2 turns activity    Assist        Assist Level: Supervision/Verbal cueing   Wheelchair 150 feet activity     Assist  Wheelchair 150 feet activity did not occur: Safety/medical concerns   Assist Level: Supervision/Verbal cueing   Blood pressure 135/82, pulse 72, temperature 97.9 F (36.6 C), temperature source Oral, resp. rate 19, height 5\' 3"  (1.6 m), weight 76.2 kg, SpO2 100 %.   Medical Problem List and Plan: 1.Functional deficits and paraparesissecondary to GBS/?variant CIR PT, OT  -presentation/onset somewhat atypical for GBS. Axial scans reviewed which do not show any signs of myelopathy. -monitor for progress in therapies. Consider f/u MRI of lumbar spine and neurology re-consult  -10/6-got exhausted- educated to not overdo, could become weaker.  2. Antithrombotics: -DVT/anticoagulation:Pharmaceutical:Lovenox -antiplatelet therapy: N/A 3. Pain Management:Ultram prn             -pain control appears adequate at present  10/3- will add Lidocaine patches to neck 8pm to 8am for neck tightness  10/4- will change lidoderm patches to sides/hips per pt request.  4. Mood:LCSW to follow for  evaluation and support. -antipsychotic agents: N/A 5. Neuropsych: This patientiscapable of making decisions onherown behalf. 6. Skin/Wound Care:Routine pressure relief measures.Difficulty with suture removal- surgeon in [place] Def[place] to primary service  10/6- will see if PA can remove sutures- nursing wasn't able to.  7. Fluids/Electrolytes/Nutrition:Monitor I/O. Check lytes in am.  8. GERD: Continue PPI 9.Right carpel tunnel release: Will order splintgiven increased need to utilize UE's for transfer, etc. Sutures to come out Monday?  10/4- will write for sutures to be removed Monday  10. Bowel and bladder: -appears to be continent -improved after metamucil   LOS: 4 days A FACE TO FACE EVALUATION WAS PERFORMED  Jahshua Bonito 12/19/2018, 12:35 PM

## 2018-12-19 NOTE — Progress Notes (Addendum)
Occupational Therapy Session Note  Patient Details  Name: Paula Sutton MRN: [id number] Date of Birth: 11-21-1955  Today's Date: 12/19/2018 OT Individual Time: 1045-1200 and 1400-1455 OT Individual Time Calculation (min): 75 min and 55 min   Short Term Goals: Week 1:  OT Short Term Goal 1 (Week 1): Pt will complete 1/3 components of donning pants using adaptive techniques/AE as needed OT Short Term Goal 2 (Week 1): Pt will complete BSC transfer with Min A and LRAD OT Short Term Goal 3 (Week 1): Pt will complete LB bathing with Mod A using adaptive techniques/AE as needed  Skilled Therapeutic Interventions/Progress Updates:    Session One: Pt seen for OT session focusing on functional mobility, LE stretching and strengthening. Pt sitting up in w/c upon arrival, agreeable to tx session and denying pain.  She propelled w/c throughout unit with supervision, VCs for w/c propulsion technique.  Sliding board transfers completed throughout session with assist to place/stabilze board and VCs for technique. Cont to provide education and hands on practice with pt managing w/c parts independently and proper set-up of chair for transfer. She transitioned to supine on mat. Provided with hip flexor and hamstring stretches on B LEs to pt tolerance as pt cont with complaints of hip tightness. Positioned in prone on mat for prolonged hip flexor stretch.  Transitioned to long sitting and pt guided through self stretches for hip flexors and glutes.  Pt provided with B leg loops and education/demonstration for LE management during mobility/transfers and self-care tasks. Pt return demonstrated ability to manage B LEs during mobility and voiced increased ease with mobility and increased independence with leg loops.  Transitioned to NuStep. Completed x7 minutes using B LEs with limited UE use in order to initiate extension.  Pt returned to room at end of session and transitioned to recliner. Left seated in recliner with  all needs in reach.   Session Two: Pt seen for OT ADL bathing/dressing session. Pt sitting up in recliner upon arrival, voiced fatigue/lethagy as just received pain pill but agreeable to tx session and denying pain. She completed sliding board transfer to w/c with assist for set-up and close supervision. Squat pivot from w/c to TTB with mod A using grab bars. She bathed seated on TTB, VCs for use of lateral leans to complete buttock/pericare hygiene and supervision when leaning forward to wash LEs.  Mod A sliding board transfer to return to w/c due to no pants to assist with sliding (towel placed on sliding board). She transitioned to bed using sliding board. She dressed in long sitting, supervision LB dressing, rolling mod I in bed with use of bed rails to pull pants up. Indep UB dressing.  Pt very excited about level of independence with modified ADLs.  Pt left in supine at end of session, all needs in reach.  Education provided regarding modified ADLs, OT/PT goals, continuum of care, and d/c planning. Pt provided with w/c home measurement sheet.  Therapy Documentation Precautions:  Precautions Precautions: Fall Restrictions Weight Bearing Restrictions: No   Therapy/Group: Individual Therapy  Yurianna Tusing L 12/19/2018, 7:15 AM

## 2018-12-19 NOTE — IPOC Note (Signed)
Overall Plan of Care [facility]) Patient Details Name: Paula Sutton MRN: [id number] DOB: [birth year]  Admitting Diagnosis: Axonal GBS (Guillain-Barre syndrome) [facility])  Hospital Problems: Principal Problem:   Axonal GBS (Guillain-Barre syndrome) (HCC) Active Problems:   Cervical myofascial pain syndrome   Carpal tunnel syndrome     Functional Problem List: Nursing Edema, Endurance, Motor, Pain, Sensory, Skin Integrity  PT Balance, Endurance, Motor, Safety, Sensory  OT Balance, Edema, Endurance, Motor, Pain, Safety, Skin Integrity  SLP    TR         Basic ADL's: OT Grooming, Bathing, Dressing, Toileting     Advanced  ADL's: OT Simple Meal Preparation     Transfers: PT Bed Mobility, Bed to Chair, Car, Furniture, Futures trader, Metallurgist: PT Ambulation, Emergency planning/management officer, Stairs     Additional Impairments: OT None  SLP        TR      Anticipated Outcomes Item Anticipated Outcome  Self Feeding No goal  Swallowing      Basic self-care  Supervision/setup-Mod I  Toileting  Supervision/setup   Bathroom Transfers Supervision/setup  Bowel/Bladder  Mod I assist  Transfers  Supervision  Locomotion  mod I once up in w/c  Communication     Cognition     Pain  < 4  Safety/Judgment  Mod I assist   Therapy Plan: PT Intensity: Minimum of 1-2 x/day ,45 to 90 minutes PT Frequency: 5 out of 7 days PT Duration Estimated Length of Stay: 10-14 days OT Intensity: Minimum of 1-2 x/day, 45 to 90 minutes OT Frequency: 5 out of 7 days OT Duration/Estimated Length of Stay: 12-14 days     Due to the current state of emergency, patients may not be receiving their 3-hours of Medicare-mandated therapy.   Team Interventions: Nursing Interventions Patient/Family Education, Medication Management, Psychosocial Support, Skin Care/Wound Management, Disease Management/Prevention, Pain Management  PT interventions Balance/vestibular training, Community  reintegration, Discharge planning, Disease management/prevention, DME/adaptive equipment instruction, Functional electrical stimulation, Functional mobility training, Neuromuscular re-education, Pain management, Patient/family education, Psychosocial support, Therapeutic Activities, Therapeutic Exercise, UE/LE Strength taining/ROM, UE/LE Coordination activities, Wheelchair propulsion/positioning  OT Interventions Training and development officer, DME/adaptive equipment instruction, Patient/family education, Therapeutic Activities, Wheelchair propulsion/positioning, Psychosocial support, Therapeutic Exercise, UE/LE Strength taining/ROM, Self Care/advanced ADL retraining, Functional mobility training, Community reintegration, Discharge planning, Neuromuscular re-education, UE/LE Coordination activities, Splinting/orthotics, Pain management, Disease mangement/prevention  SLP Interventions    TR Interventions    SW/CM Interventions Discharge Planning, Psychosocial Support, Patient/Family Education   Barriers to Discharge MD  Medical stability, Home enviroment access/loayout, Wound care, Lack of/limited family support, Weight bearing restrictions and GBS syndrome and fatigue  Nursing Lack of/limited family support husband works- no other family around  [employer] stability, 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    OT Medical stability    SLP      SW       Team Discharge Planning: Destination: PT-Home ,OT- Home , SLP-  Projected Follow-up: PT-Home health PT, Outpatient PT(TBD pending progress), OT-  Home health OT, SLP-  Projected Equipment Needs: PT-Wheelchair (measurements), Wheelchair cushion (measurements), Sliding [phone] w/c with standard cushion and leg rests), OT- To be determined, SLP-  Equipment Details: PT-see above, OT-  Patient/family involved in discharge planning: PT- Patient,  OT-Patient, SLP-   MD ELOS: 10-14 days Medical Rehab Prognosis:  Good Assessment: Pt is a 63 yr old female with  recent CTS surgery on R wrist, Guillian Barre syndrome and side/hip/back pain due to GBS admitted due to  paraparesis and functional deficits due to weakness and fatigue-  Goals- supervision to mod I for d/c goals.    See Team Conference Notes for weekly updates to the plan of care

## 2018-12-20 ENCOUNTER — Inpatient Hospital Stay (HOSPITAL_COMMUNITY): Payer: 59 | Admitting: Occupational Therapy

## 2018-12-20 ENCOUNTER — Inpatient Hospital Stay (HOSPITAL_COMMUNITY): Payer: 59 | Admitting: Physical Therapy

## 2018-12-20 NOTE — Patient Care Conference (Signed)
Inpatient RehabilitationTeam Conference and Plan of Care Update Date: 12/20/2018   Time: 9:35 AM    Patient Name: Paula Sutton      Medical Record Number: [id number]  Date of Birth: 1955-03-25 Sex: Female         Room/Bed: 4M01C/4M01C-01 Payor Info: Payor: MEDICARE / Plan: MEDICARE PART A / Product Type: *No Product type* /    Admit Date/Time:  12/15/2018  1:34 PM  Primary Diagnosis:  Axonal GBS (Guillain-Barre syndrome) ([place])  Patient Active Problem List   Diagnosis Date Noted  . Cervical myofascial pain syndrome 12/16/2018  . Carpal tunnel syndrome 12/16/2018  . Axonal GBS (Guillain-Barre syndrome) ([place]) 12/15/2018  . Leg weakness, bilateral 12/08/2018  . IBS (irritable bowel syndrome) 12/08/2018  . GERD (gastroesophageal reflux disease) 09/14/2018  . 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 Abdominal pain, epigastric 06/03/2017  . Constipation 01/27/2016  . History of adenomatous polyp of colon 01/27/2016    Expected Discharge Date: Expected Discharge Date: 12/28/18  Team Members Present: Physician leading conference: Dr. Courtney Heys Social Worker Present: Lennart Pall, LCSW Nurse Present: Judee Clara, LPN PT Present: Excell Seltzer, PT OT Present: Amy Rounds, OT SLP Present: Other (comment)(Erin Tamala Julian, SLP) PPS Coordinator present : Gunnar Fusi, SLP     Current Status/Progress Goal Weekly Team Focus  Bowel/Bladder   continent of bowel & bladder, LBM 10/6  remain continent  assist as needed & monitor   Swallow/Nutrition/ Hydration             ADL's   CGA sliding board transfers, LB dressing at bed level supervision, mod I UB dressing, min A toileting  Supervision overall  ADL re-training, functional transfers, neuro re-ed, family education and d/c planning   Mobility   mod A bed mobility, CGA to min A SB transfers, Supervision w/c mobility, dependent to stand  setup A to mod I at w/c level  LE management with mobility, LE NMR, transfers    Communication             Safety/Cognition/ Behavioral Observations            Pain   no c/o pain, has tylenol & tramadol prn  pain scale <4/10  assess & treat as needed   Skin   bruising to BUE, right wrist, sutures removed from right wrist  no new areas of skin break down  assess q shift    Rehab Goals Patient on target to meet rehab goals: Yes *See Care Plan and progress notes for long and short-term goals.     Barriers to Discharge  Current Status/Progress Possible Resolutions Date Resolved   Nursing                  PT                    OT                  SLP                SW                Discharge Planning/Teaching Needs:  Home with spouse and friends providing any needed assistance.  Teaching to be planned closer to d/c.   Team Discussion:  MD encouraging pt to avoid "over doing" in tx sessions.  Pt c/o some pain;  Cont b/b.  Using leg loops.  CGA overall with ADLs and mobility and max A to  stand.  Goals set for S - mod ind w/c level.  Plan to begin family ed next week.  Revisions to Treatment Plan:  NA    Medical Summary Current Status: main issue is pain of sides B/L Weekly Focus/Goal: side pain  Barriers to Discharge: Decreased family/caregiver support;Medication compliance;Other (comments)  Barriers to Discharge Comments: GBS- pain meds for nerve pain Possible Resolutions to Barriers: nerve pain meds?   Continued Need for Acute Rehabilitation Level of Care: The patient requires daily medical management by a physician with specialized training in physical medicine and rehabilitation for the following reasons: Direction of a multidisciplinary physical rehabilitation program to maximize functional independence : Yes Medical management of patient stability for increased activity during participation in an intensive rehabilitation regime.: Yes Analysis of laboratory values and/or radiology reports with any subsequent need for medication adjustment and/or  medical intervention. : Yes   I attest that I was present, lead the team conference, and concur with the assessment and plan of the team.   Ausencio Vaden 12/20/2018, 2:11 PM

## 2018-12-20 NOTE — Progress Notes (Signed)
[place] PHYSICAL MEDICINE & REHABILITATION PROGRESS NOTE   Subjective/Complaints:   Pt reports Sides still bothering her- very painful- therapy felt good yesterday- didn't overdo it. Takes pain meds after therapy- makes her fatigued/not able to push as hard- takes tylenol prior to therapy.  Pam- PA got stitches out.  ROS- pt denies SOB, CP, N/V/D.   Objective:   No results found. Recent Labs    12/18/18 0539  WBC 4.9  HGB 14.7  HCT 45.3  PLT 250   Recent Labs    12/18/18 0539 12/19/18 0447  NA 134* 137  K 5.0 4.9  CL 98 100  CO2 29 27  GLUCOSE 103* 102*  BUN 17 19  CREATININE 0.85 0.81  CALCIUM 9.6 9.7    Intake/Output Summary (Last 24 hours) at 12/20/2018 0910 Last data filed at 12/19/2018 1856 Gross per 24 hour  Intake 436 ml  Output -  Net 436 ml     Physical Exam: Vital Signs Blood pressure (!) 159/76, pulse 68, temperature 97.9 F (36.6 C), temperature source Oral, resp. rate 18, height 5\' 3"  (1.6 m), weight 76.2 kg, SpO2 97 %.  Nursing note; labsand vitalsreviewed. Constitutional: awake, alert, appropriate; lying in bed; in room watching TV , NAD HENT:  Head:Normocephalicand atraumatic.  Eyes: conjugate gaze.EOMare normal.  Neck: decreased cervical flexion, rotation and side bending B/L-- tight musculature of scalenes, levators and upper traps B/L-a  little better than last week  Cardiovascular:Normal rateand regular rhythm. No murmurheard. Respiratory:Effort normal. Norespiratory distress. She hasno wheezes.  [id number]. She exhibitsno distension. There isno abdominal tenderness.  Musculoskeletal:  General: No edema.  Comments: Right palm with sutures out!. Minimal tenderness to palpation.a little dried blood- no drainage; mild edema; no erythema- wearing CTS night splint.  Neurological: She is alertand oriented to person, place, and time. UE 5/5 except for right wrist/grip which is limited by recent surgery. RLE: 1+  HF, KE and 3+/5 ADF/PF. LLE: tr HF, KE and 2+/5 ADF/PF. Decreased LT below the upper thigh prox to distal. Normal sensation in UE's less sl decrease in the right carpal tunnel distribution. DTR's absent.  Skin: Skin iswarmand dry. She isnot diaphoretic.  Psychiatric: She has anormal mood and affect. Herbehavior is normal.Judgmentand thought contentnormal.        Assessment/Plan: 1. Functional deficits secondary to Guillain Barre syndrome with paraparesis  which require 3+ hours per day of interdisciplinary therapy in a comprehensive inpatient rehab setting.  Physiatrist is providing close team supervision and 24 hour management of active medical problems listed below.  Physiatrist and rehab team continue to assess barriers to discharge/monitor patient progress toward functional and medical goals  Care Tool:  Bathing    Body parts bathed by patient: Front perineal area, Right arm, Chest, Left arm, Abdomen, Right upper leg, Left upper leg, Face, Buttocks, Right lower leg, Left lower leg   Body parts bathed by helper: Buttocks, Right lower leg, Left lower leg(back)     Bathing assist Assist Level: Contact Guard/Touching assist     Upper Body Dressing/Undressing Upper body dressing   What is the patient wearing?: Pull over shirt    Upper body assist Assist Level: Independent    Lower Body Dressing/Undressing Lower body dressing      What is the patient wearing?: Pants     Lower body assist Assist for lower body dressing: Supervision/Verbal cueing(bed level)     Toileting Toileting    Toileting assist Assist for toileting: Minimal Assistance - Patient > 75%  Transfers Chair/bed transfer  Transfers assist     Chair/bed transfer assist level: Contact Guard/Touching assist     Locomotion Ambulation   Ambulation assist   Ambulation activity did not occur: Safety/medical concerns          Walk 10 feet activity   Assist  Walk 10 feet  activity did not occur: Safety/medical concerns        Walk 50 feet activity   Assist Walk 50 feet with 2 turns activity did not occur: Safety/medical concerns         Walk 150 feet activity   Assist Walk 150 feet activity did not occur: Safety/medical concerns         Walk 10 feet on uneven surface  activity   Assist Walk 10 feet on uneven surfaces activity did not occur: Safety/medical concerns         Wheelchair     Assist Will patient use wheelchair at discharge?: Yes Type of Wheelchair: Manual    Wheelchair assist level: Supervision/Verbal cueing Max wheelchair distance: 150'    Wheelchair 50 feet with 2 turns activity    Assist        Assist Level: Supervision/Verbal cueing   Wheelchair 150 feet activity     Assist  Wheelchair 150 feet activity did not occur: Safety/medical concerns   Assist Level: Supervision/Verbal cueing   Blood pressure (!) 159/76, pulse 68, temperature 97.9 F (36.6 C), temperature source Oral, resp. rate 18, height 5\' 3"  (1.6 m), weight 76.2 kg, SpO2 97 %.   Medical Problem List and Plan: 1.Functional deficits and paraparesissecondary to GBS/?variant CIR PT, OT  -presentation/onset somewhat atypical for GBS. Axial scans reviewed which do not show any signs of myelopathy. -monitor for progress in therapies. Consider f/u MRI of lumbar spine and neurology re-consult  -10/6-got exhausted- educated to not overdo, could become weaker.   10/7- sides still hurt which is common in GBS 2. Antithrombotics: -DVT/anticoagulation:Pharmaceutical:Lovenox -antiplatelet therapy: N/A 3. Pain Management:Ultram prn             -pain control appears adequate at present  10/3- will add Lidocaine patches to neck 8pm to 8am for neck tightness  10/4- will change lidoderm patches to sides/hips per pt request.  4. Mood:LCSW to follow for evaluation and  support. -antipsychotic agents: N/A 5. Neuropsych: This patientiscapable of making decisions onherown behalf. 6. Skin/Wound Care:Routine pressure relief measures.Difficulty with suture removal- surgeon in [place] Defer to primary service  10/6- will see if PA can remove sutures- nursing wasn't able to. 10/7Jeannene Patella PA got sutures out.   7. Fluids/Electrolytes/Nutrition:Monitor I/O. Check lytes in am.  8. GERD: Continue PPI 9.Right carpel tunnel release: Will order splintgiven increased need to utilize UE's for transfer, etc. Sutures to come out Monday?  10/4- will write for sutures to be removed Monday  10. Bowel and bladder: -appears to be continent -improved after metamucil   LOS: 5 days A FACE TO FACE EVALUATION WAS PERFORMED  Myrna Vonseggern 12/20/2018, 9:10 AM

## 2018-12-20 NOTE — Progress Notes (Signed)
Occupational Therapy Session Note  Patient Details  Name: Paula Sutton MRN: [id number] Date of Birth: [birth year]  Today's Date: 12/20/2018 OT Individual Time: [id number] OT Individual Time Calculation (min): 75 min    Short Term Goals: Week 1:  OT Short Term Goal 1 (Week 1): Pt will complete 1/3 components of donning pants using adaptive techniques/AE as needed OT Short Term Goal 2 (Week 1): Pt will complete BSC transfer with Min A and LRAD OT Short Term Goal 3 (Week 1): Pt will complete LB bathing with Mod A using adaptive techniques/AE as needed  Skilled Therapeutic Interventions/Progress Updates:    Pt seen for OT session focusing on ADL re-training and functional mobility specifically pt managing LEs. Pt in supine upon arrival, denying pain and agreeable to tx session, requesting to toilet.  She transferred to sitting EOB with supervision using hospital bed functions. Completed sliding board transfers with CGA, to drop arm BSC. Lateral leans onto EOB and into w/c in order to pull pants up/down. Min A to fully advance pants over hips as pt fatigued following multiple lateral leans.  She returned to w/c and completed grooming tasks from w/c level at sink mod I.  Self propelled w/c throughout unit with supervision. Min VCs for proper set-up of w/c in prep for transfer. Transferred to therapy mat and positioned in supine. B LE stretch provided to all major muscle groups of LEs. Then educated and demonstrated self-stretching techniques from supine and long sitting positions using leg loops, specifically targeting hip flexors and groin, site of pt's most pain. She used leg loops to transfer to sitting EOB with supervision. Pt able to independently place sliding board and completed sliding board transfer back to w/c at [place] I level. Pt returned to room and left seated in w/c with all needs in reach. Education/demonstration provided for pressure relief schedule and techniques. Pt return  demonstrated and voiced understanding.   Therapy Documentation Precautions:  Precautions Precautions: Fall Restrictions Weight Bearing Restrictions: No   Therapy/Group: Individual Therapy  [name] 12/20/2018, 6:59 AM

## 2018-12-20 NOTE — Evaluation (Signed)
Recreational Therapy Assessment and Plan  Patient Details  Name: Paula Sutton MRN: [id number] Date of Birth: April 25, 1955 Today's Date: 12/20/2018  Rehab Potential: Good ELOS: 10/14   Problem List:      Patient Active Problem List   Diagnosis Date Noted  . Axonal GBS (Guillain-Barre syndrome) ([place]) 12/15/2018  . Leg weakness, bilateral 12/08/2018  . IBS (irritable bowel syndrome) 12/08/2018  . GERD (gastroesophageal reflux disease) 09/14/2018  . 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 Abdominal pain, epigastric 06/03/2017  . Constipation 01/27/2016  . History of adenomatous polyp of colon 01/27/2016    Past Medical History:      Past Medical History:  Diagnosis Date  . Arthritis    knee  . Dyslipidemia   . GERD (gastroesophageal reflux disease)   . Left anterior fascicular block    PATIENT SAYS THIS SHOWED ON EKG DURING COLONOSCOPY , WAS REFERRED TO CARDIOLOGY DR. Minus Breeding  (SEE EPIC ENCOUNTER 09-2017)      Past Surgical History:       Past Surgical History:  Procedure Laterality Date  . ABDOMINAL HYSTERECTOMY    . BIOPSY  08/09/2017   Procedure: BIOPSY;  Surgeon: Danie Binder, MD;  Location: [facility];  Service: Endoscopy;;  gastric biopsy for h pylori  . CARPAL TUNNEL RELEASE Right 12/07/2018  . CATARACT EXTRACTION, BILATERAL     DID 1 YEAR APART , LAST ONE WAS 12-2017  . COLONOSCOPY WITH ESOPHAGOGASTRODUODENOSCOPY (EGD)  2014   [place] PROPOFOL N/A 08/09/2017   3 simple adenomas, diverticulosis in recto-sigmoid, sigmoid, and descending colin, moderate external and internal hemorrhoids  . ESOPHAGOGASTRODUODENOSCOPY (EGD) WITH PROPOFOL N/A 08/09/2017   Low-grade narrowing Schatzki ring due to GERD; small hiatal hernia; mild gastritis and duodenitis due to NSAIDs; biopsy with gastritis  . KNEE ARTHROSCOPY Right 11/2017   WITH DR Wynelle Link  AT [place] Right   .  POLYPECTOMY  08/09/2017   Procedure: POLYPECTOMY;  Surgeon: Danie Binder, MD;  Location: [facility];  Service: Endoscopy;;  cecal polyp hs, transverse colon polyp hs, rectal polyp cs  . TOTAL KNEE REVISION Right 03/01/2018   Procedure: RIGHT TOTAL KNEE REVISION;  Surgeon: Gaynelle Arabian, MD;  Location: [facility];  Service: Orthopedics;  Laterality: Right;  144mn    Assessment & Plan Clinical Impression:  Paula KLINGELis a 63year old female with history of OA, GERD, recent CTR right hand who was admitted on 12/08/18 with sudden onset of BLE weakness with severe spasms and paraesthesias. [facility] spine showed mild DDD/facet disease lumbar spine, moderate right neural foraminal stenosis C7-T1 and diffuse cervical spondylosis with right foraminal stenosis C3/4 and bilateral foraminal stenosis C4/5. She was found to have leukocytosis and LP done revealing elevated protein and elevated glucose--no oligoclonal bands seen. CSF culture without organism and no growth.. She was evaluated by Dr. DMerlene Laughterand started on 5 day course of IVIG due to albuminocytologic dissociation due to GBS. She has had some recovery in RLE but continues to have paraplegia requiring knees blocked and frequent rest breaks but respiratory status stable. CIR recommended due to functional decline.Patient transferred to CIR on 12/15/2018 .   Pt presents with decreased activity tolerance, decreased functional mobility, decreased balance Limiting pt's independence with leisure/community pursuits.  AssessmentLeisure History/Participation Premorbid leisure interest/current participation: [place];Community - SDoctor, hospital- Travel (  Comment) Other Leisure Interests: Cooking/Baking;Housework Leisure Participation Style: With Family/Friends Awareness of Community Resources: Excellent Psychosocial / Spiritual Spiritual Interests: Church;Womens'[employer]'s  Groups Social interaction - Mood/Behavior: Cooperative Engineer, drilling for Education?: Yes Recreational Therapy Orientation Orientation -Reviewed with patient: Available activity resources Strengths/Weaknesses Patient Strengths/Abilities: Willingness to participate;Active premorbidly Patient weaknesses: Physical limitations TR Patient demonstrates impairments in the following area(s): Edema;Endurance;Motor;Pain;Safety  Plan Rec Therapy Plan Is patient appropriate for Therapeutic Recreation?: Yes Rehab Potential: Good Treatment times per week: Min 1 TR session >20 minutes Estimated Length of Stay: 10/14 TR Treatment/Interventions: Adaptive equipment instruction;Community reintegration;Patient/family education;Therapeutic exercise;1:1 session;Functional mobility training;UE/LE Coordination activities;Balance/vestibular training;Recreation/leisure participation;Therapeutic activities;Wheelchair propulsion/positioning Recommendations for other services: Neuropsych  Recommendations for other services: Neuropsych  Discharge Criteria: Patient will be discharged from TR if patient refuses treatment 3 consecutive times without medical reason.  If treatment goals not met, if there is a change in medical status, if patient makes no progress towards goals or if patient is discharged from hospital.  The above assessment, treatment plan, treatment alternatives and goals were discussed and mutually agreed upon: by patient  [place] 12/20/2018, 3:40 PM

## 2018-12-20 NOTE — Progress Notes (Signed)
Physical Therapy Session Note  Patient Details  Name: Paula Sutton MRN: [id number] Date of Birth: 05-08-1955  Today's Date: 12/20/2018 PT Individual Time:1100-1200; 1415-1530 PT Individual Time Calculation (min): 60 min and 75 min   Short Term Goals: Week 1:  PT Short Term Goal 1 (Week 1): Pt will complete bed mobility with mod A consistently PT Short Term Goal 2 (Week 1): Pt will comlete least restrictive transfer with min A consistently PT Short Term Goal 3 (Week 1): Pt will propel w/c x 100 ft via least restrictive method with Supervision  Skilled Therapeutic Interventions/Progress Updates:    Session 1: Pt received seated in w/c in room, agreeable to PT session. Pt reports ongoing soreness in B hips, premedicated prior to start of therapy session and has performed hip flexor stretch earlier this AM. Manual w/c propulsion x 150 ft with use of BUE at Supervision level. Slide board transfer w/c to mat table with CGA, v/c for safe setup of slide board with transfer. Sit to supine to long-sit with Supervision. Pt is setup A to doff and don leg lifters with min cueing for positioning. Car transfer via slide board with min A for RLE management. Per pt her car is an [employer], 18" seat height. Slide board transfer w/c to/from Nust[place] with CGA. Nustep level 1 x 5 min with use of BLE only with assist to initiate hip and knee extension, x 1-1.5 min sets with rest break in between. Pt requests to return to bed at end of session. Slide board transfer back to bed Supervision. Sit to supine Supervision with use of leg lifters for BLE management. Pt left supine in bed with needs in reach at end of session.  Session 2: Pt received seated in bed, agreeable to PT session. Pt reports ongoing soreness in B hips. Pt is setup A for donning shoes. Supine to sit with Supervision. Slide board transfer bed to [facility] with CGA. Pt is min A for clothing management, setup for pericare after continent urination while seated on  BSC. Slide board transfer [facility] to wheelchair with CGA. Manual w/c propulsion x 150 ft with use of BUE and Supervision. Slide board transfer w/c to/from mat table with close SBA. Sit to supine to prone with Supervision. Prone hip flexor stretch with press-up 5 x 60 sec. Prone to quadruped with CGA, pt is able to maintain quadruped position x 5 min for core strengthening. Pt returns to sitting EOM with Supervision. Sit to stand with max A to stedy. Focus on upright posture with cues for hip extension and decreased shoulder elevation. Standing and reaching with one UE outside BOS for targets with manual cues for lateral weight shifting to reach targets. Pt tolerates standing x 4 trials. Cotreatment session with recreational therapy. Pt requests to return to bed at end of session. Slide board transfer back to bed SBA, sit to supine Supervision. Pt left supine in bed with needs in reach at end of session.   Therapy Documentation Precautions:  Precautions Precautions: Fall Restrictions Weight Bearing Restrictions: No    Therapy/Group: Individual Therapy   Excell Seltzer, PT, DPT  12/20/2018, 3:48 PM

## 2018-12-21 ENCOUNTER — Inpatient Hospital Stay (HOSPITAL_COMMUNITY): Payer: 59 | Admitting: *Deleted

## 2018-12-21 ENCOUNTER — Inpatient Hospital Stay (HOSPITAL_COMMUNITY): Payer: 59 | Admitting: Occupational Therapy

## 2018-12-21 ENCOUNTER — Inpatient Hospital Stay (HOSPITAL_COMMUNITY): Payer: 59 | Admitting: Physical Therapy

## 2018-12-21 MED ORDER — POLYETHYLENE GLYCOL 3350 17 G PO PACK
17.0000 g | PACK | Freq: Once | ORAL | Status: AC
  Administered 2018-12-22: 17 g via ORAL
  Filled 2018-12-21: qty 1

## 2018-12-21 NOTE — Progress Notes (Signed)
[place] PHYSICAL MEDICINE & REHABILITATION PROGRESS NOTE   Subjective/Complaints:   Pt reports took tylenol AND tramadol last night- soreness/pain in hips WAY better with that combo of meds.  Also slept a lot better.  No BM since Sunday or Monday- doesn't want to do Mg citrate since so aggressive- would like to try Miralax on its own. Already on Linzess.   Pam- PA got stitches out.  ROS- pt denies SOB, CP, N/V/D.   Objective:   No results found. No results for input(s): WBC, HGB, HCT, PLT in the last 72 hours. Recent Labs    12/19/18 0447  NA 137  K 4.9  CL 100  CO2 27  GLUCOSE 102*  BUN 19  CREATININE 0.81  CALCIUM 9.7    Intake/Output Summary (Last 24 hours) at 12/21/2018 0913 Last data filed at 12/20/2018 1810 Gross per 24 hour  Intake 600 ml  Output -  Net 600 ml     Physical Exam: Vital Signs Blood pressure 124/66, pulse 74, temperature 97.9 F (36.6 C), temperature source Oral, resp. rate 18, height 5\' 3"  (1.6 m), weight 76.2 kg, SpO2 95 %.  Nursing note; labsand vitalsreviewed. Constitutional: awake, alert, appropriate; lying in bed; in room eating breakfast, brighter affect, more awake , NAD HENT:  Head:Normocephalicand atraumatic.  Eyes: conjugate gaze.EOMare normal.  Neck: decreased cervical flexion, rotation and side bending B/L-- tight musculature of scalenes, levators and upper traps B/L-a  little better than last week  Cardiovascular:Normal rateand regular rhythm. Respiratory:Effort normal. Norespiratory distress. She hasno wheezes.  [id number]. She exhibitsno distension. There isno abdominal tenderness.  Musculoskeletal:  General: No edema.  Comments: Right palm with sutures out!. Minimal tenderness to palpation.a little dried blood- no drainage; mild edema; no erythema- wearing CTS night splint.  Neurological: She is alertand oriented to person, place, and time. UE 5/5 except for right wrist/grip which is limited by  recent surgery. RLE: 1+ HF, KE and 3+/5 ADF/PF. LLE: tr HF, KE and 2+/5 ADF/PF. Decreased LT below the upper thigh prox to distal. Normal sensation in UE's less sl decrease in the right carpal tunnel distribution. DTR's absent.  Skin: Skin iswarmand dry. She isnot diaphoretic.  Psychiatric: She has anormal mood and affect. Herbehavior is normal.Judgmentand thought contentnormal.        Assessment/Plan: 1. Functional deficits secondary to Guillain Barre syndrome with paraparesis  which require 3+ hours per Sutton of interdisciplinary therapy in a comprehensive inpatient rehab setting.  Physiatrist is providing close team supervision and 24 hour management of active medical problems listed below.  Physiatrist and rehab team continue to assess barriers to discharge/monitor patient progress toward functional and medical goals  Care Tool:  Bathing    Body parts bathed by patient: Front perineal area, Right arm, Chest, Left arm, Abdomen, Right upper leg, Left upper leg, Face, Buttocks, Right lower leg, Left lower leg   Body parts bathed by helper: Buttocks, Right lower leg, Left lower leg(back)     Bathing assist Assist Level: Contact Guard/Touching assist     Upper Body Dressing/Undressing Upper body dressing   What is the patient wearing?: Pull over shirt    Upper body assist Assist Level: Independent    Lower Body Dressing/Undressing Lower body dressing      What is the patient wearing?: Pants     Lower body assist Assist for lower body dressing: Supervision/Verbal cueing(bed level)     Toileting Toileting    Toileting assist Assist for toileting: Minimal Assistance - Patient >  75%     Transfers Chair/bed transfer  Transfers assist     Chair/bed transfer assist level: Contact Guard/Touching assist     Locomotion Ambulation   Ambulation assist   Ambulation activity did not occur: Safety/medical concerns          Walk 10 feet  activity   Assist  Walk 10 feet activity did not occur: Safety/medical concerns        Walk 50 feet activity   Assist Walk 50 feet with 2 turns activity did not occur: Safety/medical concerns         Walk 150 feet activity   Assist Walk 150 feet activity did not occur: Safety/medical concerns         Walk 10 feet on uneven surface  activity   Assist Walk 10 feet on uneven surfaces activity did not occur: Safety/medical concerns         Wheelchair     Assist Will patient use wheelchair at discharge?: Yes Type of Wheelchair: Manual    Wheelchair assist level: Supervision/Verbal cueing Max wheelchair distance: 150'    Wheelchair 50 feet with 2 turns activity    Assist        Assist Level: Supervision/Verbal cueing   Wheelchair 150 feet activity     Assist  Wheelchair 150 feet activity did not occur: Safety/medical concerns   Assist Level: Supervision/Verbal cueing   Blood pressure 124/66, pulse 74, temperature 97.9 F (36.6 C), temperature source Oral, resp. rate 18, height 5\' 3"  (1.6 m), weight 76.2 kg, SpO2 95 %.   Medical Problem List and Plan: 1.Functional deficits and paraparesissecondary to GBS/?variant CIR PT, OT  -presentation/onset somewhat atypical for GBS. Axial scans reviewed which do not show any signs of myelopathy. -monitor for progress in therapies. Consider f/u MRI of lumbar spine and neurology re-consult  -10/6-got exhausted- educated to not overdo, could become weaker.   10/7- sides still hurt which is common in GBS  10/8- did tramadol and tylenol- more helpful 2. Antithrombotics: -DVT/anticoagulation:Pharmaceutical:Lovenox -antiplatelet therapy: N/A 3. Pain Management:Ultram prn             -pain control appears adequate at present  10/3- will add Lidocaine patches to neck 8pm to 8am for neck tightness  10/4- will change lidoderm patches to sides/hips per  pt request.  4. Mood:LCSW to follow for evaluation and support. -antipsychotic agents: N/A 5. Neuropsych: This patientiscapable of making decisions onherown behalf. 6. Skin/Wound Care:Routine pressure relief measures.Difficulty with suture removal- surgeon in [place] Defer to primary service  10/6- will see if PA can remove sutures- nursing wasn't able to. 10/7Jeannene Patella PA got sutures out.   7. Fluids/Electrolytes/Nutrition:Monitor I/O. Check lytes in am.  8. GERD: Continue PPI 9.Right carpel tunnel release: Will order splintgiven increased need to utilize UE's for transfer, etc. Sutures to come out Monday?  10/4- will write for sutures to be removed Monday  10/7- sutures out 10. Bowel and bladder: -appears to be continent -improved after metamucil   10/8- will give a dose of Miralax- if doesn't work today, will do mg citrate tomorrow.   LOS: 6 days A FACE TO FACE EVALUATION WAS PERFORMED  Paula Sutton 12/21/2018, 9:13 AM

## 2018-12-21 NOTE — Progress Notes (Signed)
Social Work Patient ID: Paula Sutton, female   DOB: October 11, 1955, 63 y.o.   MRN: [id number]  Met with pt following team conference.  She is aware and agreeable with targeted d/c date of 10/15 and w/c level goals.  Denies any significant concerns at this time.  Continue to follow.  Christohper Dube, LCSW

## 2018-12-21 NOTE — Progress Notes (Signed)
Occupational Therapy Session Note  Patient Details  Name: Paula Sutton MRN: [id number] Date of Birth: December 02, 1955  Today's Date: 12/21/2018 OT Individual Time: 0845-1000 OT Individual Time Calculation (min): 75 min    Short Term Goals: Week 1:  OT Short Term Goal 1 (Week 1): Pt will complete 1/3 components of donning pants using adaptive techniques/AE as needed OT Short Term Goal 2 (Week 1): Pt will complete BSC transfer with Min A and LRAD OT Short Term Goal 3 (Week 1): Pt will complete LB bathing with Mod A using adaptive techniques/AE as needed  Skilled Therapeutic Interventions/Progress Updates:    Pt seen for OT session focusing on functional transfers and LE strengthening/mobility. Pt sitting EOB upon arrival, agreeable to tx session and denying pain. She reports ahving completed bathing/dressing routine from w/c level at sink for UB with set-up and returned to bed to dress at bed level.   She donned leg loops from supine position mod I with increased time.  Transfers: Sliding board transfers EOB>w/c with supervision, able to place board independently. Sliding board transfer w/c<> low soft surface couch. Supervision for downhill transfer onto couch and min A for uphill transfer back into w/c.  Simulated shower stall transfers utilizing tub bench. Completed with close supervision following VCs for technique.  Pt provided therapist with pictures of home bathroom set-up and w/c home measurement sheet. Discussed various options in regards to equipment set-up and home layout for increased accessibility. Squat pivot transfers w/c<> therapy mat in gym. Min A overall following VCs for technique/ demonstration. Limited buttock clearance during transfer, cont to practice.  LE stretching completed in supine on mat. Therapist provided hip flexor and hamstring stretches. Pt then able to use leg loops to self-stretch. Pt reports great diminished discomfort in B hips today compared to previous days.    Pt returned to room at end of session, left sitting up in w/c at end of session, all needs in reach.   Therapy Documentation Precautions:  Precautions Precautions: Fall Restrictions Weight Bearing Restrictions: No   Therapy/Group: Individual Therapy  Betsi Crespi L 12/21/2018, 6:53 AM

## 2018-12-21 NOTE — Progress Notes (Signed)
Physical Therapy Session Note  Patient Details  Name: Paula Sutton MRN: [id number] Date of Birth: 1956-01-03  Today's Date: 12/21/2018 PT Individual Time: 1100-1200 PT Individual Time Calculation (min): 60 min   Short Term Goals: Week 1:  PT Short Term Goal 1 (Week 1): Pt will complete bed mobility with mod A consistently PT Short Term Goal 2 (Week 1): Pt will comlete least restrictive transfer with min A consistently PT Short Term Goal 3 (Week 1): Pt will propel w/c x 100 ft via least restrictive method with Supervision  Skilled Therapeutic Interventions/Progress Updates:    Pt received seated in w/c in room, agreeable to PT session. Pt reports pain in B hips has improved since previous days due to change in medication, no current complaints of pain. Pt is able to propel manual w/c x 200 ft with use of BUE at Supervision level. Pt is at Supervision level for setting w/c up for transfers to mat table with min cueing for safe setup and management of parts. Squat pivot transfer w/c to mat table with mod A. Sit to long-sit with Supervision and use of leg loops. Seated balance in long-sitting position performing ball toss reaching outside BOS and maintaining sitting balance with SBA. Long sit to sitting EOM with Supervision. Squat pivot transfer back to w/c with mod A. Slide board transfer w/c to/from mat table with Supervision. Kinetron level 90 cm/s 3 x 30 sec hip extension with manual assist to initiate for glute strengthening and reciprocal movement training. Pt fatigues quickly with use of kinetron. Slide board transfer w/c to recliner with Supervision. Pt left seated in recliner in room with needs in reach at end of session.  Therapy Documentation Precautions:  Precautions Precautions: Fall Restrictions Weight Bearing Restrictions: No    Therapy/Group: Individual Therapy   Excell Seltzer, PT, DPT  12/21/2018, 4:14 PM

## 2018-12-21 NOTE — Care Management (Signed)
Inpatient Rehabilitation Center Individual Statement of Services  Patient Name:  Paula Sutton  Date:  12/21/2018  Welcome to the [facility].  Our goal is to provide you with an individualized program based on your diagnosis and situation, designed to meet your specific needs.  With this comprehensive rehabilitation program, you will be expected to participate in at least 3 hours of rehabilitation therapies Monday-Friday, with modified therapy programming on the weekends.  Your rehabilitation program will include the following services:  Physical Therapy (PT), Occupational Therapy (OT), 24 hour per day rehabilitation nursing, Therapeutic Recreaction (TR), Neuropsychology, Case Management (Social Worker), Rehabilitation Medicine, Nutrition Services and Pharmacy Services  Weekly team conferences will be held on Wednesdays to discuss your progress.  Your Social Worker will talk with you frequently to get your input and to update you on team discussions.  Team conferences with you and your family in attendance may also be held.  Expected length of stay: 10-14 days   Overall anticipated outcome: independent @ wheelchair  Depending on your progress and recovery, your program may change. Your Social Worker will coordinate services and will keep you informed of any changes. Your Social Worker's name and contact numbers are listed  below.  The following services may also be recommended but are not provided by the [facility] will be made to provide these services after discharge if needed.  Arrangements include referral to agencies that provide these services.  Your insurance has been verified to be:  [employer]; Medicare (A) Your primary doctor is:  [place] Crane  Pertinent information will be shared with your doctor and your insurance company.  Social Worker:   [place], [place] or [phone]   Information discussed with and copy given to patient by: Lennart Pall, 12/21/2018, 2:41 PM

## 2018-12-21 NOTE — Progress Notes (Signed)
Physical Therapy Session Note  Patient Details  Name: Paula Sutton MRN: [id number] Date of Birth: [birth year]  Today's Date: 12/21/2018 PT Individual Time: 1100-1200 PT Individual Time Calculation (min): 60 min   Short Term Goals: Week 1:  PT Short Term Goal 1 (Week 1): Pt will complete bed mobility with mod A consistently PT Short Term Goal 2 (Week 1): Pt will comlete least restrictive transfer with min A consistently PT Short Term Goal 3 (Week 1): Pt will propel w/c x 100 ft via least restrictive method with Supervision  Skilled Therapeutic Interventions/Progress Updates:   Pt received sitting in recliner and agreeable to PT. SB transfer to [facility] with supervision assist and WC set up by PT. Pt able to place board and remove board safely for transfer. PT treatment focused on WC mobility in various environments x 666f+ x 2  including hall of rehab unit, food court, and cement sidewalk [place] Supervision assist overall with min cues for technique to enter/exit elevator, how to navigate inclined surface, and over paved sidewalk. Pt returned to room and performed SB transfer to bed with supervision assist. PT assisted pt to doff shoes EOB with min assist. Sit>supine completed with supervision assist through long sitting, and left supine in bed with call bell in reach and all needs met.           Therapy Documentation Precautions:  Precautions Precautions: Fall Restrictions Weight Bearing Restrictions: No    Vital Signs: Therapy Vitals Temp: 97.8 F (36.6 C) Temp Source: Oral Pulse Rate: 89 BP: (!) 147/88 Patient Position (if appropriate): Sitting Oxygen Therapy SpO2: 100 % O2 Device: Room Air Pain: denies   Therapy/Group: Individual Therapy  ALorie Phenix10/10/2018, 4:34 PM

## 2018-12-22 ENCOUNTER — Inpatient Hospital Stay (HOSPITAL_COMMUNITY): Payer: 59 | Admitting: Occupational Therapy

## 2018-12-22 ENCOUNTER — Ambulatory Visit (HOSPITAL_COMMUNITY): Payer: 59 | Admitting: *Deleted

## 2018-12-22 NOTE — Progress Notes (Signed)
Physical Therapy Session Note  Patient Details  Name: Paula Sutton MRN: [id number] Date of Birth: 1955-08-22  Today's Date: 12/22/2018 PT Individual Time: 1103-1157 PT Individual Time Calculation (min): 54 min   Short Term Goals: Week 1:  PT Short Term Goal 1 (Week 1): Pt will complete bed mobility with mod A consistently PT Short Term Goal 2 (Week 1): Pt will comlete least restrictive transfer with min A consistently PT Short Term Goal 3 (Week 1): Pt will propel w/c x 100 ft via least restrictive method with Supervision  Skilled Therapeutic Interventions/Progress Updates:  Pt received in w/c & agreeable to tx, requesting to work on stretching & nu-step today. Pt propels w/c room>dayroom with BUE & mod I. Pt transfers w/c<>mat table with slide board & supervision with only cuing x 1 time to lock B w/c brakes. Pt transfers supine<>sitting with supervision and leg loops. Therapist provided pt with BLE stretching handout & reviewed stretching regimen with pt. Therapist performed hip flexor, hip abduction, and hamstring stretch (2-3x on each LE, 30-60 seconds each time). Pt transitioned to prone & prone on elbows for hip extensor & lower back stretch. Pt returned to w/c & nu-step unavailable so pt propels w/c dayroom>ortho gym>room with BUE & mod I. Pt completes car transfer at sedan simulated height with slide board & supervision and cuing to lock R side of w/c. Pt requires only min instructional cuing for overall technique of transfer but does well with movement. Pt is able to retreive leg rests from floor & place back on w/c. Back in room, pt transfers w/c>recliner with slide board & supervision. Pt left in recliner with BLE elevated & call bell in reach.  Therapy Documentation Precautions:  Precautions Precautions: Fall Restrictions Weight Bearing Restrictions: No  Pain: No c/o pain during session.   Therapy/Group: Individual Therapy  Waunita Schooner 12/22/2018, 12:05 PM

## 2018-12-22 NOTE — Plan of Care (Signed)
  Problem: RH Tub/Shower Transfers Goal: LTG Patient will perform tub/shower transfers w/assist (OT) Description: LTG: Patient will perform tub/shower transfers with assist, with/without cues using equipment (OT) Flowsheets (Taken 12/22/2018 1247) LTG: Pt will perform tub/shower stall transfers with assistance level of: (Modified 10/9-AR) Supervision/Verbal cueing Note: Goal modified to supervision. Yazmeen Woolf, OTR/L

## 2018-12-22 NOTE — Progress Notes (Signed)
[facility] PHYSICAL MEDICINE & REHABILITATION PROGRESS NOTE   Subjective/Complaints:   Pt reports had good BM with Miralax and Linzess. Did therapy in courtyard yesterday- went well.  Slept flat on bed- did better.  Her L shoulder has started to bother her with therapy- is new.    ROS- pt denies SOB, CP, N/V/D.   Objective:   No results found. No results for input(s): WBC, HGB, HCT, PLT in the last 72 hours. No results for input(s): NA, K, CL, CO2, GLUCOSE, BUN, CREATININE, CALCIUM in the last 72 hours.  Intake/Output Summary (Last 24 hours) at 12/22/2018 0918 Last data filed at 12/22/2018 0854 Gross per 24 hour  Intake 720 ml  Output -  Net 720 ml     Physical Exam: Vital Signs Blood pressure 130/78, pulse 86, temperature 98.8 F (37.1 C), temperature source Oral, resp. rate 18, height 5\' 3"  (1.6 m), weight 76.2 kg, SpO2 100 %.  Nursing note; labsand vitalsreviewed. Constitutional: awake, alert, appropriate; lying in bed; in room done with eating breakfast- cleaned entire plate, brighter affect, NAD HENT:  Head:Normocephalicand atraumatic.  Eyes: conjugate gaze.EOMare normal.  Neck: decreased cervical flexion, rotation and side bending B/L-- tight musculature of scalenes, levators and upper traps B/L-a  little better than last week  Cardiovascular:Normal rateand regular rhythm. Respiratory:Effort normal. Norespiratory distress. She hasno wheezes.  [id number]. She exhibitsno distension. There isno abdominal tenderness.  Musculoskeletal:  General: No edema.  Comments: Right palm with sutures out!. Minimal tenderness to palpation.a little dried blood- no drainage; mild edema; no erythema- wearing CTS night splint.  Neurological: She is alertand oriented to person, place, and time. UE 5/5 except for right wrist/grip which is limited by recent surgery. RLE: 2-/5 HF, KE  2/5 and 4-/5 ADF/PF. LLE: 1/5 HF, KE 2/5 and 4-/5 ADF/PF. Decreased LT below the  upper thigh prox to distal. Normal sensation in UE's less sl decrease in the right carpal tunnel distribution. DTR's absent.  Skin: Skin iswarmand dry. She isnot diaphoretic.  Psychiatric: She has anormal mood and affect. Herbehavior is normal.Judgmentand thought contentnormal.        Assessment/Plan: 1. Functional deficits secondary to Guillain Barre syndrome with paraparesis  which require 3+ hours per day of interdisciplinary therapy in a comprehensive inpatient rehab setting.  Physiatrist is providing close team supervision and 24 hour management of active medical problems listed below.  Physiatrist and rehab team continue to assess barriers to discharge/monitor patient progress toward functional and medical goals  Care Tool:  Bathing    Body parts bathed by patient: Front perineal area, Right arm, Chest, Left arm, Abdomen, Right upper leg, Left upper leg, Face, Buttocks, Right lower leg, Left lower leg   Body parts bathed by helper: Buttocks, Right lower leg, Left lower leg(back)     Bathing assist Assist Level: Contact Guard/Touching assist     Upper Body Dressing/Undressing Upper body dressing   What is the patient wearing?: Pull over shirt    Upper body assist Assist Level: Independent    Lower Body Dressing/Undressing Lower body dressing      What is the patient wearing?: Pants     Lower body assist Assist for lower body dressing: Supervision/Verbal cueing(bed level)     Toileting Toileting    Toileting assist Assist for toileting: Minimal Assistance - Patient > 75%     Transfers Chair/bed transfer  Transfers assist     Chair/bed transfer assist level: Supervision/Verbal cueing(slide board)     Locomotion Ambulation   Ambulation  assist   Ambulation activity did not occur: Safety/medical concerns          Walk 10 feet activity   Assist  Walk 10 feet activity did not occur: Safety/medical concerns        Walk 50 feet  activity   Assist Walk 50 feet with 2 turns activity did not occur: Safety/medical concerns         Walk 150 feet activity   Assist Walk 150 feet activity did not occur: Safety/medical concerns         Walk 10 feet on uneven surface  activity   Assist Walk 10 feet on uneven surfaces activity did not occur: Safety/medical concerns         Wheelchair     Assist Will patient use wheelchair at discharge?: Yes Type of Wheelchair: Manual    Wheelchair assist level: Supervision/Verbal cueing Max wheelchair distance: 150'    Wheelchair 50 feet with 2 turns activity    Assist        Assist Level: Supervision/Verbal cueing   Wheelchair 150 feet activity     Assist  Wheelchair 150 feet activity did not occur: Safety/medical concerns   Assist Level: Supervision/Verbal cueing   Blood pressure 130/78, pulse 86, temperature 98.8 F (37.1 C), temperature source Oral, resp. rate 18, height 5\' 3"  (1.6 m), weight 76.2 kg, SpO2 100 %.   Medical Problem List and Plan: 1.Functional deficits and paraparesissecondary to GBS/?variant CIR PT, OT  -presentation/onset somewhat atypical for GBS. Axial scans reviewed which do not show any signs of myelopathy. -monitor for progress in therapies. Consider f/u MRI of lumbar spine and neurology re-consult  -10/6-got exhausted- educated to not overdo, could become weaker.   10/7- sides still hurt which is common in GBS  10/8- did tramadol and tylenol- more helpful  10/9- strength has gone up 1/2 to 1 level in all LE muscles, of note, since admission 2. Antithrombotics: -DVT/anticoagulation:Pharmaceutical:Lovenox -antiplatelet therapy: N/A 3. Pain Management:Ultram prn             -pain control appears adequate at present  10/3- will add Lidocaine patches to neck 8pm to 8am for neck tightness  10/4- will change lidoderm patches to sides/hips per pt request.  4.  Mood:LCSW to follow for evaluation and support. -antipsychotic agents: N/A 5. Neuropsych: This patientiscapable of making decisions onherown behalf. 6. Skin/Wound Care:Routine pressure relief measures.Difficulty with suture removal- surgeon in [place] to primary service  10/6- will see if PA can remove sutures- nursing wasn't able to. 10/7Jeannene Patella PA got sutures out.   7. Fluids/Electrolytes/Nutrition:Monitor I/O. Check lytes in am.  8. GERD: Continue PPI 9.Right carpel tunnel release: Will order splintgiven increased need to utilize UE's for transfer, etc. Sutures to come out Monday?  10/4- will write for sutures to be removed Monday  10/7- sutures out 10. Bowel and bladder: -appears to be continent -improved after metamucil   10/8- will give a dose of Miralax- if doesn't work today, will do mg citrate tomorrow.   10/9- had good BM with miralax and Linzess- plans to take miralax daily.   LOS: 7 days A FACE TO FACE EVALUATION WAS PERFORMED  Naida Escalante 12/22/2018, 9:18 AM

## 2018-12-22 NOTE — Progress Notes (Signed)
Occupational Therapy Weekly Progress Note  Patient Details  Name: Paula Sutton MRN: [id number] Date of Birth: Jul 25, 1955  Beginning of progress report period: December 16, 2018 End of progress report period: December 22, 2018  Today's Date: 12/22/2018 OT Individual Time: 1345-1500 OT Individual Time Calculation (min): 75 min    Patient has met 3 of 3 short term goals.  Pt is making excellent progress towards OT goals. She is completing sliding board transfers with supervision/set-up-mod I level. Bed mobility at mod I level for LB bathing/dressing completed with set-up. Plan for pt's husband to come in for family education next week in prep for d/c home at w/c level.   Patient continues to demonstrate the following deficits: muscle paralysis, decreased cardiorespiratoy endurance and decreased sitting balance, decreased standing balance, decreased postural control and decreased balance strategies and therefore will continue to benefit from skilled OT intervention to enhance overall performance with BADL and Reduce care partner burden.  Patient progressing toward long term goals..  Continue plan of care.  OT Short Term Goals Week 1:  OT Short Term Goal 1 (Week 1): Pt will complete 1/3 components of donning pants using adaptive techniques/AE as needed OT Short Term Goal 1 - Progress (Week 1): Met OT Short Term Goal 2 (Week 1): Pt will complete BSC transfer with Min A and LRAD OT Short Term Goal 2 - Progress (Week 1): Met OT Short Term Goal 3 (Week 1): Pt will complete LB bathing with Mod A using adaptive techniques/AE as needed OT Short Term Goal 3 - Progress (Week 1): Met Week 2:  OT Short Term Goal 1 (Week 2): STG=LTG due to LOS  Skilled Therapeutic Interventions/Progress Updates:    Pt seen for OT session focusing on neuro re-ed and ADL/IADL re-training. Pt sitting up in recliner upon arrival, agreeable to tx session. Requesting pain meds during session for back stiffness, RN made aware and  administered during session.  She completed sliding board transfers throughout session with supervision, able to place own board in prep for transfers, requiring min VCs for proper w/c set-up in prep for transfer.  On therapy mat, transitioned into long sitting and then quadriped with close supervision. Completed core strengthening task in long sitting on first trial, quadriped second trial. Pt tolerating ~2 minutes in each position before requiring prone rest break.  Pt able to manage LEs throughout mobility on mat to transition back to sitting EOM. Completed IADL re-training of laundry and kitchen mobility/accessibility. Education/demonstraiton and return teach back of various methods of accessibility and set-up and use of reacher to increase safety and accessibility. Pt independently managed w/c brakes appropriately throughout IADL tasks. Pt returned to room at end of session, transferred back to recliner and left with all needs in reach.   Therapy Documentation Precautions:  Precautions Precautions: Fall Restrictions Weight Bearing Restrictions: No Pain: Pain Assessment Pain Scale: 0-10 Pain Score: 4  Pain Type: Acute pain Pain Location: Hip, back Pain Orientation: Right;Left Pain Descriptors / Indicators: Aching Pain Frequency: Intermittent Pain Onset: Gradual Patients Stated Pain Goal: 0 Pain Intervention(s): Medication (See eMAR), RN made aware   Therapy/Group: Individual Therapy  Kesean Serviss L 12/22/2018, 7:00 AM

## 2018-12-22 NOTE — Progress Notes (Signed)
Occupational Therapy Session Note  Patient Details  Name: Paula Sutton MRN: [id number] Date of Birth: 09-26-1955  Today's Date: 12/22/2018 OT Individual Time: 9278-0044 OT Individual Time Calculation (min): 40 min    Short Term Goals: Week 1:  OT Short Term Goal 1 (Week 1): Pt will complete 1/3 components of donning pants using adaptive techniques/AE as needed OT Short Term Goal 1 - Progress (Week 1): Met OT Short Term Goal 2 (Week 1): Pt will complete BSC transfer with Min A and LRAD OT Short Term Goal 2 - Progress (Week 1): Met OT Short Term Goal 3 (Week 1): Pt will complete LB bathing with Mod A using adaptive techniques/AE as needed OT Short Term Goal 3 - Progress (Week 1): Met Week 2:  OT Short Term Goal 1 (Week 2): STG=LTG due to LOS  Skilled Therapeutic Interventions/Progress Updates:    1:1 Functional problem solving bathroom situation at home. Pt with walk in shower at home - decided to use tub bench facing away from the water controls to eliminate  having to transfer w/c<toilet<tub bench. Discussed slide board transfers after bathing and importance of having fabric in between skin and board (ie towel). Also discussed energy conservation strategies including showering at night.   Pt also reports having a suction grab bar just to have an extra support in the shower.   Therapy Documentation Precautions:  Precautions Precautions: Fall Restrictions Weight Bearing Restrictions: No Pain: Pain Assessment Pain Scale: 0-10 Pain Score: 0-No pain   Therapy/Group: Individual Therapy  Willeen Cass [facility] 12/22/2018, 11:07 AM

## 2018-12-23 MED ORDER — MUSCLE RUB 10-15 % EX CREA
TOPICAL_CREAM | Freq: Two times a day (BID) | CUTANEOUS | Status: DC | PRN
  Administered 2018-12-25: 21:00:00 via TOPICAL
  Filled 2018-12-23: qty 85

## 2018-12-23 NOTE — Progress Notes (Signed)
[place] PHYSICAL MEDICINE & REHABILITATION PROGRESS NOTE   Subjective/Complaints:   Back pain , lower back no radiation to LE.  No prior hx.      ROS- pt denies SOB, CP, N/V/D.   Objective:   No results found. No results for input(s): WBC, HGB, HCT, PLT in the last 72 hours. No results for input(s): NA, K, CL, CO2, GLUCOSE, BUN, CREATININE, CALCIUM in the last 72 hours.  Intake/Output Summary (Last 24 hours) at 12/23/2018 0609 Last data filed at 12/22/2018 1820 Gross per 24 hour  Intake 840 ml  Output -  Net 840 ml     Physical Exam: Vital Signs Blood pressure (!) 166/61, pulse 62, temperature 97.7 F (36.5 C), temperature source Oral, resp. rate 16, height 5\' 3"  (1.6 m), weight 76.2 kg, SpO2 100 %.  Nursing note; labsand vitalsreviewed. Constitutional: awake, alert, appropriate; lying in bed; in room done with eating breakfast- cleaned entire plate, brighter affect, NAD HENT:  Head:Normocephalicand atraumatic.  Eyes: conjugate gaze.EOMare normal.  Neck: decreased cervical flexion, rotation and side bending B/L-- tight musculature of scalenes, levators and upper traps B/L-a  little better than last week  Cardiovascular:Normal rateand regular rhythm. Respiratory:Effort normal. Norespiratory distress. She hasno wheezes.  [id number]. She exhibitsno distension. There isno abdominal tenderness.  Musculoskeletal:  General: No edema.  Comments: Right palm with sutures out!. Minimal tenderness to palpation.a little dried blood- no drainage; mild edema; no erythema- wearing CTS night splint.  Neurological: She is alertand oriented to person, place, and time. UE 5/5 except for right wrist/grip which is limited by recent surgery. RLE: 2-/5 HF, KE  2/5 and 4-/5 ADF/PF. LLE: 1/5 HF, KE 2/5 and 4-/5 ADF/PF. Decreased LT below the upper thigh prox to distal. Normal sensation in UE's less sl decrease in the right carpal tunnel distribution. DTR's absent.   Skin: Skin iswarmand dry. She isnot diaphoretic.  Psychiatric: She has anormal mood and affect. Herbehavior is normal.Judgmentand thought contentnormal.    MSK no tenderness to palpation of lumbar     Assessment/Plan: 1. Functional deficits secondary to Guillain Barre syndrome with paraparesis  which require 3+ hours per day of interdisciplinary therapy in a comprehensive inpatient rehab setting.  Physiatrist is providing close team supervision and 24 hour management of active medical problems listed below.  Physiatrist and rehab team continue to assess barriers to discharge/monitor patient progress toward functional and medical goals  Care Tool:  Bathing    Body parts bathed by patient: Front perineal area, Right arm, Chest, Left arm, Abdomen, Right upper leg, Left upper leg, Face, Buttocks, Right lower leg, Left lower leg   Body parts bathed by helper: Buttocks, Right lower leg, Left lower leg(back)     Bathing assist Assist Level: Contact Guard/Touching assist     Upper Body Dressing/Undressing Upper body dressing   What is the patient wearing?: Pull over shirt    Upper body assist Assist Level: Independent    Lower Body Dressing/Undressing Lower body dressing      What is the patient wearing?: Pants     Lower body assist Assist for lower body dressing: Supervision/Verbal cueing(bed level)     Toileting Toileting    Toileting assist Assist for toileting: Minimal Assistance - Patient > 75%     Transfers Chair/bed transfer  Transfers assist     Chair/bed transfer assist level: (slide board)     Locomotion Ambulation   Ambulation assist   Ambulation activity did not occur: Safety/medical concerns  Walk 10 feet activity   Assist  Walk 10 feet activity did not occur: Safety/medical concerns        Walk 50 feet activity   Assist Walk 50 feet with 2 turns activity did not occur: Safety/medical concerns         Walk  150 feet activity   Assist Walk 150 feet activity did not occur: Safety/medical concerns         Walk 10 feet on uneven surface  activity   Assist Walk 10 feet on uneven surfaces activity did not occur: Safety/medical concerns         Wheelchair     Assist Will patient use wheelchair at discharge?: Yes Type of Wheelchair: Manual    Wheelchair assist level: Independent Max wheelchair distance: 150'    Wheelchair 50 feet with 2 turns activity    Assist        Assist Level: Independent   Wheelchair 150 feet activity     Assist  Wheelchair 150 feet activity did not occur: Safety/medical concerns   Assist Level: Independent   Blood pressure (!) 166/61, pulse 62, temperature 97.7 F (36.5 C), temperature source Oral, resp. rate 16, height 5\' 3"  (1.6 m), weight 76.2 kg, SpO2 100 %.   Medical Problem List and Plan: 1.Functional deficits and paraparesissecondary to GBS/?variant CIR PT, OT  -presentation/onset somewhat atypical for GBS. Axial scans reviewed which do not show any signs of myelopathy. -monitor for progress in therapies. Consider f/u MRI of lumbar spine and neurology re-consult   2. Antithrombotics: -DVT/anticoagulation:Pharmaceutical:Lovenox -antiplatelet therapy: N/A 3. Pain Management:Ultram prn             -pain control appears adequate at present  10/3- will add Lidocaine patches to neck 8pm to 8am for neck tightness  10/4- will change lidoderm patches to sides/hips per pt request.  Kpad and trolamine cream fo rlow back ache   No sciatic symptoms 4. Mood:LCSW to follow for evaluation and support. -antipsychotic agents: N/A 5. Neuropsych: This patientiscapable of making decisions onherown behalf. 6. Skin/Wound Care:Routine pressure relief measures7. Fluids/Electrolytes/Nutrition:Monitor I/O. Check lytes in am.  8. GERD: Continue PPI 9.Right carpel tunnel  release: Will order splintgiven increased need to utilize UE's for transfer, etc. Sutures to come out Monday?   10. Bowel and bladder: -appears to be continent -improved after metamucil   10/8- will give a dose of Miralax- if doesn't work today, will do mg citrate tomorrow.   10/9- had good BM with miralax and Linzess- plans to take miralax daily.   LOS: 8 days A FACE TO FACE EVALUATION WAS PERFORMED  Charlett Blake 12/23/2018, 6:09 AM

## 2018-12-24 ENCOUNTER — Inpatient Hospital Stay (HOSPITAL_COMMUNITY): Payer: 59 | Admitting: Occupational Therapy

## 2018-12-24 NOTE — Progress Notes (Signed)
Occupational Therapy Session Note  Patient Details  Name: JAMAYA BETTENHAUSEN MRN: [id number] Date of Birth: 1955-07-12  Today's Date: 12/24/2018 OT Individual Time: 1300-1330 OT Individual Time Calculation (min): 30 min    Short Term Goals: Week 2:  OT Short Term Goal 1 (Week 2): STG=LTG due to LOS  Skilled Therapeutic Interventions/Progress Updates:    Pt seen for OT session focusing on standing tolerance/upright balance for LE strengthening and endurance.  Pt sitting up in recliner upon arrival, agreeable to tx session and denying pain. She reports cont complaints of stiffness in B hip flexors and requesting use of standing frame to stretch. Completed sliding board transfer from recliner to w/c with set-up and CGA.  Tolerated 13 minutes in standing frame, no complaints of dizziness. Able to maintain upright posture and balance without UE support. x10 mini squats into standing with UE support. Pt voiced feeling much better following standing. Discussed continuum of care and benefits of OP therapies for her care as well as safe ways to incorporate therapeutic standing at d/c.  Pt returned to room at end of session, left sitting up in w/c with all needs in reach.   Therapy Documentation Precautions:  Precautions Precautions: Fall Restrictions Weight Bearing Restrictions: No   Therapy/Group: Individual Therapy  Kao Berkheimer L 12/24/2018, 6:43 AM

## 2018-12-24 NOTE — Progress Notes (Signed)
[place] PHYSICAL MEDICINE & REHABILITATION PROGRESS NOTE   Subjective/Complaints:   Very happy for Kpad, wants one for home    ROS- pt denies SOB, CP, N/V/D.   Objective:   No results found. No results for input(s): WBC, HGB, HCT, PLT in the last 72 hours. No results for input(s): NA, K, CL, CO2, GLUCOSE, BUN, CREATININE, CALCIUM in the last 72 hours.  Intake/Output Summary (Last 24 hours) at 12/24/2018 0704 Last data filed at 12/23/2018 1945 Gross per 24 hour  Intake 357 ml  Output -  Net 357 ml     Physical Exam: Vital Signs Blood pressure (!) 155/83, pulse 71, temperature 97.7 F (36.5 C), resp. rate 16, height 5\' 3"  (1.6 m), weight 76.2 kg, SpO2 98 %.  Nursing note; labsand vitalsreviewed. Constitutional: awake, alert, appropriate; lying in bed; in room done with eating breakfast- cleaned entire plate, brighter affect, NAD HENT:  Head:Normocephalicand atraumatic.  Eyes: conjugate gaze.EOMare normal.  Neck: decreased cervical flexion, rotation and side bending B/L-- tight musculature of scalenes, levators and upper traps B/L-a  little better than last week  Cardiovascular:Normal rateand regular rhythm. Respiratory:Effort normal. Norespiratory distress. She hasno wheezes.  [id number]. She exhibitsno distension. There isno abdominal tenderness.  Musculoskeletal:  General: No edema.  Comments: Right palm with sutures out!. Minimal tenderness to palpation.a little dried blood- no drainage; mild edema; no erythema- wearing CTS night splint.  Neurological: She is alertand oriented to person, place, and time. UE 5/5 except for right wrist/grip which is limited by recent surgery. RLE: 2-/5 HF, KE  2/5 and 4-/5 ADF/PF. LLE: 1/5 HF, KE 2/5 and 4-/5 ADF/PF. Decreased LT below the upper thigh prox to distal. Normal sensation in UE's less sl decrease in the right carpal tunnel distribution. DTR's absent.  Skin: Skin iswarmand dry. She isnot  diaphoretic.  Psychiatric: She has anormal mood and affect. Herbehavior is normal.Judgmentand thought contentnormal.    MSK no tenderness to palpation of lumbar     Assessment/Plan: 1. Functional deficits secondary to Guillain Barre syndrome with paraparesis  which require 3+ hours per day of interdisciplinary therapy in a comprehensive inpatient rehab setting.  Physiatrist is providing close team supervision and 24 hour management of active medical problems listed below.  Physiatrist and rehab team continue to assess barriers to discharge/monitor patient progress toward functional and medical goals  Care Tool:  Bathing    Body parts bathed by patient: Front perineal area, Right arm, Chest, Left arm, Abdomen, Right upper leg, Left upper leg, Face, Buttocks, Right lower leg, Left lower leg   Body parts bathed by helper: Buttocks, Right lower leg, Left lower leg(back)     Bathing assist Assist Level: Contact Guard/Touching assist     Upper Body Dressing/Undressing Upper body dressing   What is the patient wearing?: Pull over shirt    Upper body assist Assist Level: Independent    Lower Body Dressing/Undressing Lower body dressing      What is the patient wearing?: Pants     Lower body assist Assist for lower body dressing: Supervision/Verbal cueing(bed level)     Toileting Toileting    Toileting assist Assist for toileting: Minimal Assistance - Patient > 75%     Transfers Chair/bed transfer  Transfers assist     Chair/bed transfer assist level: (slide board)     Locomotion Ambulation   Ambulation assist   Ambulation activity did not occur: Safety/medical concerns          Walk 10 feet activity  Assist  Walk 10 feet activity did not occur: Safety/medical concerns        Walk 50 feet activity   Assist Walk 50 feet with 2 turns activity did not occur: Safety/medical concerns         Walk 150 feet activity   Assist Walk 150  feet activity did not occur: Safety/medical concerns         Walk 10 feet on uneven surface  activity   Assist Walk 10 feet on uneven surfaces activity did not occur: Safety/medical concerns         Wheelchair     Assist Will patient use wheelchair at discharge?: Yes Type of Wheelchair: Manual    Wheelchair assist level: Independent Max wheelchair distance: 150'    Wheelchair 50 feet with 2 turns activity    Assist        Assist Level: Independent   Wheelchair 150 feet activity     Assist  Wheelchair 150 feet activity did not occur: Safety/medical concerns   Assist Level: Independent   Blood pressure (!) 155/83, pulse 71, temperature 97.7 F (36.5 C), resp. rate 16, height 5\' 3"  (1.6 m), weight 76.2 kg, SpO2 98 %.   Medical Problem List and Plan: 1.Functional deficits and paraparesissecondary to GBS/?variant CIR PT, OT  No neurogenic bowel or bladder    2. Antithrombotics: -DVT/anticoagulation:Pharmaceutical:Lovenox -antiplatelet therapy: N/A 3. Pain Management:Ultram prn             -pain control appears adequate at present  10/3- will add Lidocaine patches to neck 8pm to 8am for neck tightness  10/4- will change lidoderm patches to sides/hips per pt request.  Kpad and trolamine cream for low back ache  much better last noc  Mood:LCSW to follow for evaluation and support. -antipsychotic agents: N/A 5. Neuropsych: This patientiscapable of making decisions onherown behalf. 6. Skin/Wound Care:Routine pressure relief measures7. Fluids/Electrolytes/Nutrition:Monitor I/O. Check lytes in am.  8. GERD: Continue PPI 9.Right carpel tunnel release: Will order splintgiven increased need to utilize UE's for transfer, etc. Sutures to come out Monday?   10. Bowel and bladder: -appears to be continent -improved after metamucil   10/8- will give a dose of Miralax- if  doesn't work today, will do mg citrate tomorrow.   10/9- had good BM with miralax and Linzess- plans to take miralax daily.   LOS: 9 days A FACE TO FACE EVALUATION WAS PERFORMED  Charlett Blake 12/24/2018, 7:04 AM

## 2018-12-25 ENCOUNTER — Inpatient Hospital Stay (HOSPITAL_COMMUNITY): Payer: 59

## 2018-12-25 ENCOUNTER — Inpatient Hospital Stay (HOSPITAL_COMMUNITY): Payer: 59 | Admitting: Occupational Therapy

## 2018-12-25 LAB — CBC
HCT: 44.2 % (ref 36.0–46.0)
Hemoglobin: 14.3 g/dL (ref 12.0–15.0)
MCH: 29.7 pg (ref 26.0–34.0)
MCHC: 32.4 g/dL (ref 30.0–36.0)
MCV: 91.7 fL (ref 80.0–100.0)
Platelets: 342 10*3/uL (ref 150–400)
RBC: 4.82 MIL/uL (ref 3.87–5.11)
RDW: 14.1 % (ref 11.5–15.5)
WBC: 5.1 10*3/uL (ref 4.0–10.5)
nRBC: 0 % (ref 0.0–0.2)

## 2018-12-25 LAB — BASIC METABOLIC PANEL
Anion gap: 10 (ref 5–15)
BUN: 21 mg/dL (ref 8–23)
CO2: 22 mmol/L (ref 22–32)
Calcium: 9.7 mg/dL (ref 8.9–10.3)
Chloride: 104 mmol/L (ref 98–111)
Creatinine, Ser: 0.83 mg/dL (ref 0.44–1.00)
GFR calc Af Amer: >60 mL/min (ref >60)
GFR calc non Af Amer: >60 mL/min (ref >60)
Glucose, Bld: 102 mg/dL — ABNORMAL HIGH (ref 70–99)
Potassium: 4.3 mmol/L (ref 3.5–5.1)
Sodium: 136 mmol/L (ref 135–145)

## 2018-12-25 LAB — GLUCOSE, CAPILLARY: Glucose-Capillary: 137 mg/dL — ABNORMAL HIGH (ref 70–99)

## 2018-12-25 NOTE — Progress Notes (Addendum)
Physical Therapy Session Note  Patient Details  Name: Paula Sutton MRN: [id number] Date of Birth: 1956/01/09  Today's Date: 12/25/2018 PT Individual Time: 1500-1605 PT Individual Time Calculation (min): 65 min   Short Term Goals:  Week 2:  PT Short Term Goal 1 (Week 2): = LTGs due to ELOS  Skilled Therapeutic Interventions/Progress Updates:  Pt resting in bed.  Supervision for supine> sit using leg straps and bed raised slightly at [facility].   Reviewed hand out for HEP PROM.  Pt is familiar with them but her husband not present to practice.  Seated Therapeutic exercise performed with LEs to increase strength for functional mobility L 2 x 10 bil hip adductor against pillow.  Slide board transfer with supervision, no cues.   Attempted sit> stand at sink.  Pt scooted forward without elevating hips, unsafely.  Reviewed forward wt shifting, in sitting toward front of w/c, with recliner blocking bil knees, arms crossed on chest, 2 x 10 trunk flexion/extension.  Attempted to elevate hips with bil hands on recliner seat, but pt unable.  Using static stander x 10 minutes for wt bearing bil LEs and tolerance of standing,, during dual task of coloring pages with colored pencils.  W/c> recliner slide board transfer with supervision.  At end of session, pt resting recliner, with bil LEs elevated and needs at hand.     Therapy Documentation Precautions:  Precautions Precautions: Fall Restrictions Weight Bearing Restrictions: No Therapy Vitals Temp: 98 F (36.7 C) Temp Source: Oral Pulse Rate: 80 BP: 138/62 Patient Position (if appropriate): Lying Oxygen Therapy SpO2: 99 % O2 Device: Room Air Pain: Pain Assessment 3/10, muscular, bil upper arms; will use muscle rub when she returns to room.     Therapy/Group: Individual Therapy  Jamieon Lannen 12/25/2018, 4:24 PM

## 2018-12-25 NOTE — Plan of Care (Signed)
  Problem: Sit to Stand Goal: LTG:  Patient will perform sit to stand with assistance level (PT) Description: LTG:  Patient will perform sit to stand with assistance level (PT) Outcome: Not Applicable Flowsheets (Taken 12/25/2018 1022) LTG: PT will perform sit to stand in preparation for functional mobility with assistance level: (D/C) -- Note: D/c at this time using lift equipment for standing.

## 2018-12-25 NOTE — Progress Notes (Signed)
Occupational Therapy Session Note  Patient Details  Name: Paula Sutton MRN: [id number] Date of Birth: 07/12/1955  Today's Date: 12/25/2018 OT Individual Time: 1015-1110 OT Individual Time Calculation (min): 55 min    Short Term Goals: Week 2:  OT Short Term Goal 1 (Week 2): STG=LTG due to LOS  Skilled Therapeutic Interventions/Progress Updates:    Pt seen for OT Family ed session with pt's husband. Pt sitting up in w/c upon arrival with husband present for scheduled family ed session. Pt denying pain and agreeable to tx session. Throughout session, pt completed sliding board transfers with supervision/set-up assist. Transfers included w/c<> low soft surface couch and w/c<> TTB as she will do at d/c. Pt's husband provided all needed set-up/supervision assist and also provided with education regarding how to provide min A if pt were to require assist during transfer.  Completed x3 sit>stand at sink with cabinets blocking LEs. Mod A to power into stand and tolerating ~20 seconds each trial with steadying assist before requiring seated rest break. Discussed home set-up to allow for standing trials at sink for therapeutic use only as pt not able to stand functionally at this time. Education provided throughout session regarding continuum of care, activity progression, OT recommendations for B/D at d/c, functional transfers, home set-up/accessibility, decreased sensation and functional implications, pressure relief schedule, and d/c planning. Pt returned to room with assist of husband at end of session, all needs met. Pt and caregiver voice feeling comfortable with family ed today and ready for d/c at end of week.   Therapy Documentation Precautions:  Precautions Precautions: Fall Restrictions Weight Bearing Restrictions: No      Therapy/Group: Individual Therapy  Jordynn Marcella L 12/25/2018, 7:04 AM

## 2018-12-25 NOTE — Progress Notes (Signed)
Physical Therapy Weekly Progress Note  Patient Details  Name: Paula Sutton MRN: [id number] Date of Birth: 1955-10-17  Beginning of progress report period: December 16, 2018 End of progress report period: December 25, 2018  Today's Date: 12/25/2018 PT Individual Time: 4656-8127 PT Individual Time Calculation (min): 67 min  Session focused on family education training with pt's husband. Educated on, demonstrated, and return demonstrated basic bed mobility on flat surface (with use of leg loops), slideboard transfers including w/c <> bed, bed <> BSC x 2, and w/c <> simulated car (low sedan height). Initially PT demonstrated and then husband return demonstrated with cues for correct body positioning during guarding, board placement, and education on fall risk. W/c parts management and discussion in regards to w/c seating evaluation tomorrow. Discussed options on legrests being removable or option for fixed footplate and pros/cons of each. Simulated car transfer to low sedan with supervision to CGA and cues for technique and sequencing. Husband to measure height of small SUV as possible other option. At time recommending lower car but suggested we could try actual measurement of SUV to see if feesible option. During session, pt required use of BSC x 2 and had husband assist with clothing management and transfer. Discussed various options for clothing management as well as dressing bed level vs w/c level and how they would set this up at home in their bathroom.    Patient has met 3 of 3 short term goals.  Pt is making functional progress towards goals with planned d/c on 10/15. Family education initiated with husband today during therapy session with hands on practice. Plan for seating evaluation with [place] for ultralightweight w/c.   Patient continues to demonstrate the following deficits muscle weakness, muscle joint tightness and muscle paralysis, decreased cardiorespiratoy endurance and  decreased sitting balance, decreased standing balance, decreased postural control and decreased balance strategies and therefore will continue to benefit from skilled PT intervention to increase functional independence with mobility.  Patient progressing toward long term goals..  Continue plan of care.  PT Short Term Goals Week 1:  PT Short Term Goal 1 (Week 1): Pt will complete bed mobility with mod A consistently PT Short Term Goal 1 - Progress (Week 1): Met PT Short Term Goal 2 (Week 1): Pt will comlete least restrictive transfer with min A consistently PT Short Term Goal 2 - Progress (Week 1): Met PT Short Term Goal 3 (Week 1): Pt will propel w/c x 100 ft via least restrictive method with Supervision PT Short Term Goal 3 - Progress (Week 1): Met Week 2:  PT Short Term Goal 1 (Week 2): = LTGs due to ELOS  Skilled Therapeutic Interventions/Progress Updates:  Balance/vestibular training;Community reintegration;Discharge planning;Disease management/prevention;DME/adaptive equipment instruction;Functional electrical stimulation;Functional mobility training;Neuromuscular re-education;Pain management;Patient/family education;Psychosocial support;Therapeutic Activities;Therapeutic Exercise;UE/LE Strength taining/ROM;UE/LE Coordination activities;Wheelchair propulsion/positioning;Skin care/wound management;Splinting/orthotics   Therapy Documentation Precautions:  Precautions Precautions: Fall Restrictions Weight Bearing Restrictions: No Pain: Reports soreness in her back. Declines intervention at this time  Therapy/Group: Individual Therapy  Canary Brim [facility] 12/25/2018, 10:16 AM

## 2018-12-25 NOTE — Progress Notes (Signed)
[place] PHYSICAL MEDICINE & REHABILITATION PROGRESS NOTE   Subjective/Complaints:   Pt reports she loves the kpad so much, she found one on [employer] to order for home use.  Wants lidocaine patches stopped since using Kpad.  Husband coming in for family training today; wants a massage- explained that's OK, but needs to be able to get on table once gets home. D/C 10/15   ROS- pt denies SOB, CP, N/V/D.   Objective:   No results found. Recent Labs    12/25/18 0708  WBC 5.1  HGB 14.3  HCT 44.2  PLT 342   Recent Labs    12/25/18 0708  NA 136  K 4.3  CL 104  CO2 22  GLUCOSE 102*  BUN 21  CREATININE 0.83  CALCIUM 9.7    Intake/Output Summary (Last 24 hours) at 12/25/2018 0958 Last data filed at 12/25/2018 0750 Gross per 24 hour  Intake 600 ml  Output -  Net 600 ml     Physical Exam: Vital Signs Blood pressure 137/65, pulse 71, temperature 97.7 F (36.5 C), temperature source Oral, resp. rate 16, height 5\' 3"  (1.6 m), weight 75.8 kg, SpO2 97 %.  Nursing note; labsand vitalsreviewed. Constitutional: awake, alert, appropriate; sitting on EOB, using Kpad, NAD HENT:  Head:Normocephalicand atraumatic.  Eyes: conjugate gaze.EOMare normal.  Neck: much improved Cervical ROM Cardiovascular:Normal rateand regular rhythm. Respiratory:Effort normal. Norespiratory distress. She hasno wheezes.  [id number]. She exhibitsno distension. There isno abdominal tenderness.  Musculoskeletal:  General: No edema.  Comments: Right palm with sutures out!. Minimal tenderness to palpation.a little dried blood- no drainage; mild edema; no erythema- wearing CTS night splint.  Neurological: She is alertand oriented to person, place, and time. UE 5/5 except for right wrist/grip which is limited by recent surgery. RLE: 2-/5 HF, KE  2/5 and 4-/5 ADF/PF. LLE: 1/5 HF, KE 2/5 and 4-/5 ADF/PF. Decreased LT below the upper thigh prox to distal. Normal sensation in UE's less  sl decrease in the right carpal tunnel distribution. DTR's absent.  Skin: Skin iswarmand dry. She isnot diaphoretic.  Psychiatric: bright affect- focused on having a good day MSK no tenderness to palpation of lumbar     Assessment/Plan: 1. Functional deficits secondary to Guillain Barre syndrome with paraparesis  which require 3+ hours per day of interdisciplinary therapy in a comprehensive inpatient rehab setting.  Physiatrist is providing close team supervision and 24 hour management of active medical problems listed below.  Physiatrist and rehab team continue to assess barriers to discharge/monitor patient progress toward functional and medical goals  Care Tool:  Bathing    Body parts bathed by patient: Front perineal area, Right arm, Chest, Left arm, Abdomen, Right upper leg, Left upper leg, Face, Buttocks, Right lower leg, Left lower leg   Body parts bathed by helper: Buttocks, Right lower leg, Left lower leg(back)     Bathing assist Assist Level: Contact Guard/Touching assist     Upper Body Dressing/Undressing Upper body dressing   What is the patient wearing?: Pull over shirt    Upper body assist Assist Level: Independent    Lower Body Dressing/Undressing Lower body dressing      What is the patient wearing?: Pants     Lower body assist Assist for lower body dressing: Supervision/Verbal cueing(bed level)     Toileting Toileting    Toileting assist Assist for toileting: Minimal Assistance - Patient > 75%     Transfers Chair/bed transfer  Transfers assist     Chair/bed transfer assist  level: (slide board)     Locomotion Ambulation   Ambulation assist   Ambulation activity did not occur: Safety/medical concerns          Walk 10 feet activity   Assist  Walk 10 feet activity did not occur: Safety/medical concerns        Walk 50 feet activity   Assist Walk 50 feet with 2 turns activity did not occur: Safety/medical concerns          Walk 150 feet activity   Assist Walk 150 feet activity did not occur: Safety/medical concerns         Walk 10 feet on uneven surface  activity   Assist Walk 10 feet on uneven surfaces activity did not occur: Safety/medical concerns         Wheelchair     Assist Will patient use wheelchair at discharge?: Yes Type of Wheelchair: Manual    Wheelchair assist level: Independent Max wheelchair distance: 150'    Wheelchair 50 feet with 2 turns activity    Assist        Assist Level: Independent   Wheelchair 150 feet activity     Assist  Wheelchair 150 feet activity did not occur: Safety/medical concerns   Assist Level: Independent   Blood pressure 137/65, pulse 71, temperature 97.7 F (36.5 C), temperature source Oral, resp. rate 16, height 5\' 3"  (1.6 m), weight 75.8 kg, SpO2 97 %.   Medical Problem List and Plan: 1.Functional deficits and paraparesissecondary to GBS/?variant CIR PT, OT  No neurogenic bowel or bladder    2. Antithrombotics: -DVT/anticoagulation:Pharmaceutical:Lovenox -antiplatelet therapy: N/A 3. Pain Management:Ultram prn             -pain control appears adequate at present  10/3- will add Lidocaine patches to neck 8pm to 8am for neck tightness  10/4- will change lidoderm patches to sides/hips per pt request.  Kpad and trolamine cream for low back ache  much better last noc   10/12- d/c'd lidocaine patches per pt request  Mood:LCSW to follow for evaluation and support. -antipsychotic agents: N/A 5. Neuropsych: This patientiscapable of making decisions onherown behalf. 6. Skin/Wound Care:Routine pressure relief measures 7. Fluids/Electrolytes/Nutrition:Monitor I/O. Check lytes in am.  8. GERD: Continue PPI 9.Right carpel tunnel release: Will order splintgiven increased need to utilize UE's for transfer, etc. Sutures to come out Monday?  10/12- sutures out last  week   10. Bowel and bladder: -appears to be continent -improved after metamucil   10/8- will give a dose of Miralax- if doesn't work today, will do mg citrate tomorrow.   10/9- had good BM with miralax and Linzess- plans to take miralax daily.   LOS: 10 days A FACE TO FACE EVALUATION WAS PERFORMED  Yer Olivencia 12/25/2018, 9:58 AM

## 2018-12-26 ENCOUNTER — Inpatient Hospital Stay (HOSPITAL_COMMUNITY): Payer: 59 | Admitting: Occupational Therapy

## 2018-12-26 ENCOUNTER — Inpatient Hospital Stay (HOSPITAL_COMMUNITY): Payer: 59

## 2018-12-26 NOTE — Progress Notes (Signed)
Occupational Therapy Discharge Summary  Patient Details  Name: Paula Sutton MRN: [id number] Date of Birth: 30-Jan-1956   Patient has met 8 of 8 long term goals due to improved activity tolerance, improved balance, postural control, ability to compensate for deficits and improved coordination.  Patient to discharge at overall Supervision level.  Patient's care partner is independent to provide the necessary physical assistance at discharge.  Pt's husband has completed hands on family education. He voices and demonstrates willing and ableness to provide needed assist at d/c.  She is completing sliding board transfers with supervision. Bed level bathing/dressing with set-up or shower level with set-up/supervision. Toileting being completed on drop arm BSC with lateral leans for clothing management. Recommend strict w/c level only at d/c as standing not functional at this time.    Recommendation:  Patient will benefit from ongoing skilled OT services in home health setting to continue to advance functional skills in the area of BADL and Reduce care partner burden.  Equipment: Drop arm BSC. Pt to order TTB privately  Reasons for discharge: treatment goals met and discharge from hospital  Patient/family agrees with progress made and goals achieved: Yes  OT Discharge Precautions/Restrictions  Precautions Precautions: Fall Restrictions Weight Bearing Restrictions: No Vision Baseline Vision/History: Wears glasses Wears Glasses: Reading only Patient Visual Report: No change from baseline Vision Assessment?: No apparent visual deficits Perception  Perception: Within Functional Limits Praxis Praxis: Intact Cognition Overall Cognitive Status: Within Functional Limits for tasks assessed Arousal/Alertness: Awake/alert Orientation Level: Oriented X4 Memory: Appears intact Awareness: Appears intact Problem Solving: Appears intact Safety/Judgment: Appears intact Sensation Sensation Light  Touch: Appears Intact Proprioception: Impaired Detail Proprioception Impaired Details: Impaired RLE;Impaired LLE Coordination Gross Motor Movements are Fluid and Coordinated: No Fine Motor Movements are Fluid and Coordinated: Yes Coordination and Movement Description: incomplete paraplegia Motor  Motor Motor: Paraplegia;Abnormal tone;Abnormal postural alignment and control Motor - Discharge Observations: Paraplegia 2/2 GBS  Trunk/Postural Assessment  Cervical Assessment Cervical Assessment: Within Functional Limits Thoracic Assessment Thoracic Assessment: Within Functional Limits Lumbar Assessment Lumbar Assessment: Exceptions to WFL(Posterior pelvic tilt) Postural Control Postural Control: Within Functional Limits Postural Limitations: impaired  Balance Balance Balance Assessed: Yes Static Sitting Balance Static Sitting - Balance Support: Feet supported;No upper extremity supported Static Sitting - Level of Assistance: 6: Modified independent (Device/Increase time) Static Sitting - Comment/# of Minutes: Sitting EOB Dynamic Sitting Balance Dynamic Sitting - Balance Support: No upper extremity supported;Feet supported Dynamic Sitting - Level of Assistance: 6: Modified independent (Device/Increase time) Extremity/Trunk Assessment RUE Assessment RUE Assessment: Within Functional Limits LUE Assessment LUE Assessment: Within Functional Limits   Rounds, Amy L 12/26/2018, 7:22 AM

## 2018-12-26 NOTE — Progress Notes (Signed)
[place] PHYSICAL MEDICINE & REHABILITATION PROGRESS NOTE   Subjective/Complaints:   Pt reports she wants to go home Wednesday after therapy is done- lives 30 minutes from here.  Pain in hips doing better today.  Shoulders sore from therapy- thinks they will be until gets stronger.  Muscle rub "burns"- getting measured for light weight w/c today.  Light weight w/c necessary because we aren't sure what LE return pt will get overall from GBS. She will need to be in as lightweight w/c as possible because she's 63, doesn't have great Upper body strength, and needs to be able to propel w/c as far as she needs to go in community.    ROS- pt denies SOB, CP, N/V/D.   Objective:   No results found. Recent Labs    12/25/18 0708  WBC 5.1  HGB 14.3  HCT 44.2  PLT 342   Recent Labs    12/25/18 0708  NA 136  K 4.3  CL 104  CO2 22  GLUCOSE 102*  BUN 21  CREATININE 0.83  CALCIUM 9.7    Intake/Output Summary (Last 24 hours) at 12/26/2018 1249 Last data filed at 12/26/2018 0749 Gross per 24 hour  Intake 820 ml  Output -  Net 820 ml     Physical Exam: Vital Signs Blood pressure 127/60, pulse 66, temperature 98.3 F (36.8 C), temperature source Oral, resp. rate 16, height 5\' 3"  (1.6 m), weight 75.8 kg, SpO2 98 %.  Nursing note; labsand vitalsreviewed. Constitutional: awake, alert, appropriate; on BSC- has finished going; NAD HENT:  Head:Normocephalicand atraumatic.  Eyes: conjugate gaze.EOMare normal.  Neck: much improved Cervical ROM Cardiovascular:Normal rateand regular rhythm. Respiratory:Effort normal. Norespiratory distress. She hasno wheezes.  [id number]. She exhibitsno distension. There isno abdominal tenderness.  Musculoskeletal:  General: No edema.  Comments: Right palm with sutures out!. Minimal tenderness to palpation.a little dried blood- no drainage; mild edema; no erythema- wearing CTS night splint.  Neurological: She is  alertand oriented to person, place, and time. UE 5/5 except for right wrist/grip which is limited by recent surgery. RLE: 2-/5 HF, KE  2/5 and 4-/5 ADF/PF. LLE: 1/5 HF, KE 2/5 and 4-/5 ADF/PF. Decreased LT below the upper thigh prox to distal. Normal sensation in UE's less sl decrease in the right carpal tunnel distribution. DTR's absent.  Skin: Skin iswarmand dry. She isnot diaphoretic.  Psychiatric: bright affect- focused on having a good day MSK no tenderness to palpation of lumbar     Assessment/Plan: 1. Functional deficits secondary to Guillain Barre syndrome with paraparesis  which require 3+ hours per day of interdisciplinary therapy in a comprehensive inpatient rehab setting.  Physiatrist is providing close team supervision and 24 hour management of active medical problems listed below.  Physiatrist and rehab team continue to assess barriers to discharge/monitor patient progress toward functional and medical goals  Care Tool:  Bathing    Body parts bathed by patient: Front perineal area, Right arm, Chest, Left arm, Abdomen, Right upper leg, Left upper leg, Face, Buttocks, Right lower leg, Left lower leg   Body parts bathed by helper: Buttocks, Right lower leg, Left lower leg(back)     Bathing assist Assist Level: Contact Guard/Touching assist     Upper Body Dressing/Undressing Upper body dressing   What is the patient wearing?: Pull over shirt    Upper body assist Assist Level: Independent    Lower Body Dressing/Undressing Lower body dressing      What is the patient wearing?: Pants  Lower body assist Assist for lower body dressing: Supervision/Verbal cueing(bed level)     Toileting Toileting    Toileting assist Assist for toileting: Minimal Assistance - Patient > 75%     Transfers Chair/bed transfer  Transfers assist     Chair/bed transfer assist level: Supervision/Verbal cueing     Locomotion Ambulation   Ambulation assist    Ambulation activity did not occur: Safety/medical concerns          Walk 10 feet activity   Assist  Walk 10 feet activity did not occur: Safety/medical concerns        Walk 50 feet activity   Assist Walk 50 feet with 2 turns activity did not occur: Safety/medical concerns         Walk 150 feet activity   Assist Walk 150 feet activity did not occur: Safety/medical concerns         Walk 10 feet on uneven surface  activity   Assist Walk 10 feet on uneven surfaces activity did not occur: Safety/medical concerns         Wheelchair     Assist Will patient use wheelchair at discharge?: Yes Type of Wheelchair: Manual    Wheelchair assist level: Independent Max wheelchair distance: 150'    Wheelchair 50 feet with 2 turns activity    Assist        Assist Level: Independent   Wheelchair 150 feet activity     Assist  Wheelchair 150 feet activity did not occur: Safety/medical concerns   Assist Level: Independent   Blood pressure 127/60, pulse 66, temperature 98.3 F (36.8 C), temperature source Oral, resp. rate 16, height 5\' 3"  (1.6 m), weight 75.8 kg, SpO2 98 %.   Medical Problem List and Plan: 1.Functional deficits and paraparesissecondary to GBS/?variant CIR PT, OT  No neurogenic bowel or bladder  10/13- will need a light weight w/c due to incomplete paraplegia due to her GBS- don't know is/when she will get LE function back, and due to her being 63 yrs old and female, she doesn't have as much UB strength to propel W/C, so needs light weight manual w/c, so can push self in community.     2. Antithrombotics: -DVT/anticoagulation:Pharmaceutical:Lovenox -antiplatelet therapy: N/A 3. Pain Management:Ultram prn             -pain control appears adequate at present  10/3- will add Lidocaine patches to neck 8pm to 8am for neck tightness  10/4- will change lidoderm patches to sides/hips per pt request.   Kpad and trolamine cream for low back ache  much better last noc   10/12- d/c'd lidocaine patches per pt request   10/13- trying muscle rub but burns  Mood:LCSW to follow for evaluation and support. -antipsychotic agents: N/A 5. Neuropsych: This patientiscapable of making decisions onherown behalf. 6. Skin/Wound Care:Routine pressure relief measures 7. Fluids/Electrolytes/Nutrition:Monitor I/O. Check lytes in am.  8. GERD: Continue PPI 9.Right carpel tunnel release: Will order splintgiven increased need to utilize UE's for transfer, etc. Sutures to come out Monday?  10/12- sutures out last week   10. Bowel and bladder: -appears to be continent -improved after metamucil   10/8- will give a dose of Miralax- if doesn't work today, will do mg citrate tomorrow.   10/9- had good BM with miralax and Linzess- plans to take miralax daily.   LOS: 11 days A FACE TO FACE EVALUATION WAS PERFORMED  Yovani Cogburn 12/26/2018, 12:49 PM

## 2018-12-26 NOTE — Progress Notes (Signed)
Occupational Therapy Session Note  Patient Details  Name: NARDOS SKENANDORE MRN: [id number] Date of Birth: [birth year]  Today's Date: 12/26/2018 OT Individual Time: 1400-1430 OT Individual Time Calculation (min): 30 min    Short Term Goals: Week 2:  OT Short Term Goal 1 (Week 2): STG=LTG due to LOS  Skilled Therapeutic Interventions/Progress Updates:    Pt seen for OT session focusing on functional mobility and LE ROM. Pt in supine upon arrival, agreeable to tx session and denying pain.  She transferred to sitting EOB with supervision and donned shoes seated EOB with supervision, crossing LEs into figure four position in order to complete.  Completed sliding board transfers throughout session at [place] I level, able to place board independently.  Transfers EOB<> w/c, w/c<> EOM and w/c>recliner.  Transitioned to supine on mat. Completed LE stretching/ROM of hip flexors and hamstrings to pt tolerance, hypersensitive to L hip stretch. x3 minutes in prone for prolonged stretch.  Pt returned to room at end of session, transitioned to recliner and left with all needs in reach.  Pt voiced family ed went well with husband yesterday, he felt comfortable with all education and ready for d/c home at end of week.   Therapy Documentation Precautions:  Precautions Precautions: Fall Restrictions Weight Bearing Restrictions: No   Therapy/Group: Individual Therapy  Camerin Ladouceur L 12/26/2018, 6:57 AM

## 2018-12-26 NOTE — Progress Notes (Addendum)
Occupational Therapy Session Note  Patient Details  Name: Paula Sutton MRN: [id number] Date of Birth: [birth year]  Today's Date: 12/26/2018 OT Individual Time: 1100-1200 OT Individual Time Calculation (min): 60 min    Short Term Goals: Week 1:  OT Short Term Goal 1 (Week 1): Pt will complete 1/3 components of donning pants using adaptive techniques/AE as needed OT Short Term Goal 1 - Progress (Week 1): Met OT Short Term Goal 2 (Week 1): Pt will complete BSC transfer with Min A and LRAD OT Short Term Goal 2 - Progress (Week 1): Met OT Short Term Goal 3 (Week 1): Pt will complete LB bathing with Mod A using adaptive techniques/AE as needed OT Short Term Goal 3 - Progress (Week 1): Met Week 2:  OT Short Term Goal 1 (Week 2): STG=LTG due to LOS  Skilled Therapeutic Interventions/Progress Updates:    1:1 Pt participated in bathing at shower level. Pt able to perform shower transfer with A for placement of board and to hold board in place- able to physically move herself. Pt bathed 10/10 parts with lateral leans with long handled sponge for LEs. Towel placed underneath her with lateral leans and slide board transfer back to w/c. Pt dressed in the w/c mod I for UB and min A for LB. Pt able to thread LB but required A to get pants over hips with lateral leans (leaned into the bed). Pt able to perform grooming mod I  2nd session 13:05-14:00 1:1 Pt requested to participate in exercising LEs in [place] with little use of UEs for movements in Les on resistance level 1. Pt would use UE handles to help initiate movement for pedals and then Les could carry out ROM. Pt able to complete task for 35 min with 1 rest break in between.   Therapy Documentation Precautions:  Precautions Precautions: Fall Restrictions Weight Bearing Restrictions: No General:  bruising  noted on lateral aspect of both LEs    Pain:  no c/o pain that interfers with therapy- did request mm rub to be applied after shower. No  pain in pm session   Therapy/Group: Individual Therapy  Willeen Cass [facility] 12/26/2018, 2:34 PM

## 2018-12-26 NOTE — Progress Notes (Signed)
Physical Therapy Discharge Summary  Patient Details  Name: Paula Sutton MRN: [id number] Date of Birth: 06/17/1955  Today's Date: 12/27/2018 PT Individual Time: 0800-0903, 1400-1505 PT Individual Time Calculation (min): 63 min , 65  Pt missed 10 min of PM session due to fatigue.   Patient has met 5 of 5 long term goals due to improved activity tolerance, improved balance, improved postural control and ability to compensate for deficits.  Patient to discharge at a wheelchair level Supervision.   Patient's care partner is independent to provide the necessary physical and supervision assistance at discharge.  Reasons goals not met: n/a - all goals met at this time  Recommendation:  Patient will benefit from ongoing skilled PT services in home health setting to continue to advance safe functional mobility, address ongoing impairments in postural control, strength, balance, sensation, functional mobility, and minimize fall risk.  Equipment: 30"slideboard and custom ultra lightweight w/c from [employer]  Reasons for discharge: treatment goals met and discharge from hospital  Patient/family agrees with progress made and goals achieved: Yes  PT Discharge tx 1:  Pt stated that her R elbow and upper arm are really sore, and thinks it is from attempting to pull up into standing, and lifting LLE using leg strap.  PT recommended that she limit w/c propulsion and not attempt to pull up to stand today.  Using flat bed, no rails, no straps, rolling L><R with min cues to flex knees if possible; with leg straps, modified independent.  Supine>< sit modified independent with leg straps, no cues.   Slide board transfer bed> w/c with set-up.  neuromuscular re-education via demo and multimodal cues: in R side lying, L hip flexion, extension, abduction (clam shells with flexed hips and knees); in L side lying, R hip flexion, extension; seated trunk flexion/extension without use of UEs, bil glut sets  without use of UEs.  Cues to count aloud to prevent Valsalva.  Simulated car transfer to sedan ht seat, set-up with 30" long slide board.   PT instructed pt in use of cryotherapy for L elbow/muscular pain.  At end of session, pt in w/c with ice pack on L elbow.  Needs at hand.   tx 2:  Pt resting in recliner.  Slide board transfer recliner> bed with supervision.  Sit> supine iwht supervision.  neuromuscular re-education via demo, multimodal cues for hook lying with use of gait belt around thighs- bil hip abduction/adduction,  bil lower trunk rotation (with paced breathing for stretching at end range),  In L/R side lying- R/L isolated hip flex/extension, isolated knee flexion/extension, in R side lying-L hip abduction (clam shells) partial R/L side  lying>< R/L side lying without use of rails or UEs  PT instructed pt in diaphragmatic breathing for relaxation and breath control, with fair return demonstration,   R sidelying with knees and hips flexed>< sitting without use of straps, max cues for technique to extend knees to placed feet of of EOB before trying to push up; blocked practice with improvement with practice.  At end of session, pt resting in R side lying with needs at hand, alarm set and ice pack on L upper arm.   Precautions/Restrictions Precautions Precautions: Fall Restrictions Weight Bearing Restrictions: No  Pain AM- Pain Assessment Pain Scale: 0-10 Pain Score: 6  Pain Type: Acute pain Pain Location: Arm Pain Orientation: Left Pain Descriptors / Indicators: Aching Pain Frequency: Intermittent Pain Onset: Gradual Patients Stated Pain Goal: 0 Pain Intervention(s): Medication (See eMAR)  PM- 2/10   L elbow Vision/Perception  Perception Perception: Within Functional Limits Praxis Praxis: Intact  Cognition Overall Cognitive Status: Within Functional Limits for tasks assessed Arousal/Alertness: Awake/alert Orientation Level: Oriented X4 Memory: Appears  intact Awareness: Appears intact Problem Solving: Appears intact Safety/Judgment: Appears intact Sensation Sensation Light Touch: Appears Intact Proprioception: Impaired Detail Proprioception Impaired Details: Impaired RLE;Impaired LLE Coordination Gross Motor Movements are Fluid and Coordinated: No Fine Motor Movements are Fluid and Coordinated: Yes Coordination and Movement Description: incomplete paraplegia Motor  Motor Motor: Paraplegia;Abnormal tone;Abnormal postural alignment and control Motor - Discharge Observations: Paraplegia 2/2 GBS  Mobility Bed Mobility Bed Mobility: Rolling Right;Rolling Left;Supine to Sit;Sit to Supine Rolling Right: Supervision/verbal cueing(use of leg loops) Rolling Left: Supervision/Verbal cueing(use of leg loops) Supine to Sit: Supervision/Verbal cueing(use of leg loops) Sit to Supine: Supervision/Verbal cueing(use of leg loops) Transfers Transfers: Lateral/Scoot Transfers Lateral/Scoot Transfers: Set up assist Transfer (Assistive device): Other (Comment)(slideboard) Locomotion  Gait Ambulation: No Gait Gait: No Stairs / Additional Locomotion Stairs: No Wheelchair Mobility Wheelchair Mobility: Yes Wheelchair Assistance: Independent with assistive device Wheelchair Propulsion: Both upper extremities Distance: 150  Trunk/Postural Assessment  Cervical Assessment Cervical Assessment: Within Functional Limits Thoracic Assessment Thoracic Assessment: Within Functional Limits Lumbar Assessment Lumbar Assessment: Exceptions to WFL(Posterior pelvic tilt) Postural Control Postural Control: Within Functional Limits Postural Limitations: impaired  Balance Balance Balance Assessed: Yes Static Sitting Balance Static Sitting - Balance Support: Feet supported;No upper extremity supported Static Sitting - Level of Assistance: 6: Modified independent (Device/Increase time) Static Sitting - Comment/# of Minutes: Sitting EOB Dynamic Sitting  Balance Dynamic Sitting - Balance Support: No upper extremity supported;Feet supported Dynamic Sitting - Level of Assistance: 6: Modified independent (Device/Increase time) Static Standing Balance Static Standing - Level of Assistance: 1: +1 Total assist(use of standing frame) Extremity Assessment  RUE Assessment RUE Assessment: Within Functional Limits LUE Assessment LUE Assessment: Within Functional Limits RLE Assessment RLE Assessment: Exceptions to WFL Passive Range of Motion (PROM) Comments: tight HS and HC Active Range of Motion (AROM) Comments: also, in L side lying, 2/5 hip extension General Strength Comments: impaired, see below RLE Strength Right Hip Flexion: 2/5 Right Knee Flexion: 3-/5 Right Knee Extension: 2+/5 Right Ankle Dorsiflexion: 4-/5 LLE Assessment LLE Assessment: Exceptions to WFL Passive Range of Motion (PROM) Comments: tight HS and HCs Active Range of Motion (AROM) Comments: also, in R side lying, 2-/5 hip extension General Strength Comments: impaired, see below LLE Strength Left Hip Flexion: 2/5 Left Knee Flexion: 2-/5 Left Knee Extension: 3-/5(able to accept min resistance briefly) Left Ankle Dorsiflexion: 4+/5    COOK,CAROLINE  Paula Sutton, PT, DPT, CBIS  12/27/2018, 10:24 AM   

## 2018-12-26 NOTE — Progress Notes (Signed)
Physical Therapy Session Note  Patient Details  Name: Paula Sutton MRN: [id number] Date of Birth: [birth year]  Today's Date: 12/26/2018 PT Individual Time: 0900-1000 PT Individual Time Calculation (min): 60 min   Short Term Goals: Week 2:  PT Short Term Goal 1 (Week 2): = LTGs due to ELOS  Skilled Therapeutic Interventions/Progress Updates:    Pt seen for formal seating evaluation with Deatra Ina from [facility] to evaluation and assess for ultralightweight w/c with specialty cushion and back for increased independence with mobility and ADLs and increase functional sitting tolerance. Recommendations made based of pt current functional status and increased risk for skin breakdown and limited mobility at this time. Discussed ultralightweight benefits for energy conservation and decreased strain through UE's. Trial in rigid frame w/c to simulate this option. Pt performed functional slideboard transfers with supervision w/c -> mat -> new w/c. Added J2 cushion for increased pressure distribution and increased sitting tolerance. Pt able to propel this w/c more efficiently and reports decreased strain through shoulders.Pt mod I with mobility. Educated on more efficient technique to decrease overuse of shoulders.   Therapy Documentation Precautions:  Precautions Precautions: Fall Restrictions Weight Bearing Restrictions: No   Pain: Pain Assessment Pain Scale: 0-10 Pain Score: 0-No pain      Therapy/Group: Individual Therapy  Canary Brim Ivory Broad, PT, DPT, CBIS  12/26/2018, 10:10 AM

## 2018-12-27 ENCOUNTER — Inpatient Hospital Stay (HOSPITAL_COMMUNITY): Payer: 59

## 2018-12-27 MED ORDER — TRAMADOL HCL 50 MG PO TABS: 50.0000 mg | ORAL_TABLET | Freq: Four times a day (QID) | ORAL | 0 refills | Status: DC | PRN

## 2018-12-27 MED ORDER — LINACLOTIDE 72 MCG PO CAPS: 72.0000 ug | ORAL_CAPSULE | Freq: Every day | ORAL | 0 refills | Status: DC

## 2018-12-27 MED ORDER — LORATADINE 10 MG PO TABS: 10.0000 mg | ORAL_TABLET | Freq: Every day | ORAL | Status: DC

## 2018-12-27 MED ORDER — FLUTICASONE PROPIONATE 50 MCG/ACT NA SUSP: 1.0000 | Freq: Every day | NASAL | 2 refills | Status: DC

## 2018-12-27 MED ORDER — MUSCLE RUB 10-15 % EX CREA: 1.0000 "application " | TOPICAL_CREAM | Freq: Two times a day (BID) | CUTANEOUS | 0 refills | Status: DC | PRN

## 2018-12-27 NOTE — Progress Notes (Signed)
Pt discharged to home. Escorted by Nursing staff by wheelchair with all personal belongings. Discharge teaching provided by [name]-PA. No further questions nor concerns. Paula Sutton

## 2018-12-27 NOTE — Progress Notes (Signed)
Social Work Discharge Note   The overall goal for the admission was met for:   Discharge location: Yes - home with spouse who can provide 24/7 assistance.  Length of Stay: Yes - 12 days  Discharge activity level: Yes - independent to set up at w/c level  Home/community participation: Yes  Services provided included: MD, RD, PT, OT, RN, Pharmacy and [place]: Medicare and Private Insurance: [employer] (primary)  Follow-up services arranged: Home Health: PT, OT via [facility], DME: wheelchair, cushion, drop arm commode and 30" tf board via [employer] and Patient/Family has no preference for HH/DME agencies  Comments (or additional information):    Contact info:  Pt @ [phone]   spouse, Cadance Raus @ [phone]  Patient/Family verbalized understanding of follow-up arrangements: Yes  Individual responsible for coordination of the follow-up plan: pt  Confirmed correct DME delivered: Lennart Pall 12/27/2018    Harvey Matlack

## 2018-12-27 NOTE — Discharge Summary (Signed)
Physician Discharge Summary  Patient ID: Paula Sutton MRN: [id number] DOB/AGE: 04/15/1955 63 y.o.  Admit date: 12/15/2018 Discharge date: 12/26/2018  Discharge Diagnoses:  Principal Problem:   Axonal GBS (Guillain-Barre syndrome) (HCC) Active Problems:   Cervical myofascial pain syndrome   Carpal tunnel syndrome   Discharged Condition: stable    Labs:  Basic Metabolic Panel: BMP Latest Ref Rng & Units 12/25/2018 12/19/2018 12/18/2018  Glucose 70 - 99 mg/dL 102(H) 102(H) 103(H)  BUN 8 - 23 mg/dL 21 19 17   Creatinine 0.44 - 1.00 mg/dL 0.83 0.81 0.85  Sodium 135 - 145 mmol/L 136 137 134(L)  Potassium 3.5 - 5.1 mmol/L 4.3 4.9 5.0  Chloride 98 - 111 mmol/L 104 100 98  CO2 22 - 32 mmol/L 22 27 29   Calcium 8.9 - 10.3 mg/dL 9.7 9.7 9.6    CBC: CBC Latest Ref Rng & Units 12/25/2018 12/18/2018 12/12/2018  WBC 4.0 - 10.5 K/uL 5.1 4.9 5.4  Hemoglobin 12.0 - 15.0 g/dL 14.3 14.7 13.4  Hematocrit 36.0 - 46.0 % 44.2 45.3 41.9  Platelets 150 - 400 K/uL 342 250 202    CBG: Recent Labs  Lab 12/25/18 1651  GLUCAP 137*    Brief HPI:   Paula Sutton is a 63 y.o. female with history of OSA, GERD, recent CTR right hand was admitted on 12/08/2018 with sudden onset of BLE weakness with severe spasms and paresthesias.  She was admitted to [facility] for work-up and MRI of spine showed mild DDD with facet disease of lumbar spine moderate right neural foraminal stenosis C7-T1 with diffuse cervical spondylosis and bilateral foraminal stenosis C 4 to C5.  She was found to have leukocytosis and LP done revealing elevated protein with elevated glucose.  No oligoclonal bands seen.  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  She has had some recovery in RLE but continues to have paraplegia.  Therapy ongoing and CIR recommended due to functional decline.    Hospital Course: Paula Sutton was admitted to rehab 12/15/2018 for inpatient therapies to  consist of PT and OT at least three hours five days a week. Past admission physiatrist, therapy team and rehab RN have worked together to provide customized collaborative inpatient rehab.  Follow-up CMET showed mild hyponatremia with elevated LFTs.  Recheck labs have shown renal status and lytes are WNL. Recommend recheck of LFTs and 1 to 2 weeks for follow-up.  CBC showed leukocytosis has resolved and H&H is stable. Blood pressures were monitored on TID basis and has been stable. She was maintained on Lovenox for DVT prophylaxis.   Right wrist incision has been healing well and sutures were removed on 10/10. Wrist splint was added for support due to recent surgery. Lidocaine patches were added to help with neck tightness and back pain but were discontinued per patient request. She was unable to tolerate trolamine cream due to SE. Ultram has been used on prn basis. Bowel program was adjusted to help with chronic constipation.  Lower dose Linzess with Miralax has shown good results. She is continent of bowel and bladder. She has made gains during rehab stay and is independent at wheelchair level. She will continue to receive follow up HHPT and [place] [facility] [place]   after discharge   Rehab course: During patient's stay in rehab weekly team conferences were held to monitor patient's progress, set goals and discuss barriers to discharge. At admission, patient required mod assist  with basic self care tasks and mod assist with mobility. She  has had improvement in activity tolerance, balance, postural control as well as ability to compensate for deficits. She is able to complete ADL tasks with supervision. She is able to perform lateral scoot transfers with set up with SB. She requires total assist for standing in standing frame.  She is using leg loops to help manage BLE with bed mobility and to help with transfers. Family education was completed regarding all aspects of care and safety.     Disposition:  Home  Diet: Regular.  Special Instructions: 1. No driving or strenuous activity till cleared by MD.  Discharge Instructions    Ambulatory referral to Physical Medicine Rehab   Complete by: As directed    1-2 weeks transitional care appt   Ambulatory referral to Physical Medicine Rehab   Complete by: As directed    EMG/NCS in 2-3 weeks/Treated for GBS recently     Allergies as of 12/27/2018      Reactions   Sulfa Antibiotics Anaphylaxis, Other (See Comments)   Tongue swelling    Oxycodone Hives      Medication List    STOP taking these medications   K2 PLUS D3 PO   ondansetron 4 MG tablet Commonly known as: ZOFRAN     TAKE these medications   acetaminophen 325 MG tablet Commonly known as: TYLENOL Take 1-2 tablets (325-650 mg total) by mouth every 4 (four) hours as needed for mild pain. What changed:   how much to take  reasons to take this   CALCIUM PO Take 1 tablet by mouth 2 (two) times daily.   estradiol 0.1 mg/24hr patch Commonly known as: CLIMARA - Dosed in mg/24 hr Place 0.1 mg onto the skin once a week.   fluticasone 50 MCG/ACT nasal spray Commonly known as: FLONASE Place 1 spray into both nostrils daily. Available OTC   lansoprazole 30 MG capsule Commonly known as: Prevacid 1 po 30 mins prior to first meal AND LAST MEAL   linaclotide 72 MCG capsule Commonly known as: LINZESS Take 1 capsule (72 mcg total) by mouth daily before breakfast. What changed:   medication strength  how much to take   loratadine 10 MG tablet Commonly known as: CLARITIN Take 1 tablet (10 mg total) by mouth daily.   Muscle Rub 10-15 % Crea Apply 1 application topically 2 (two) times daily as needed for muscle pain.   nystatin powder Commonly known as: MYCOSTATIN/NYSTOP Apply topically 2 (two) times daily.   polyethylene glycol 17 g packet Commonly known as: MIRALAX / GLYCOLAX Take 17 g by mouth daily as needed.   traMADol 50 MG tablet--Rx # 20 pills Commonly  known as: ULTRAM Take 1 tablet (50 mg total) by mouth 4 (four) times daily as needed for severe pain.   Vitamin D3 125 MCG (5000 UT) Caps Take 5,000 Units by mouth 2 (two) times daily.      Follow-up Information    Lovorn, Jinny Blossom, MD Follow up.   Specialty: Physical Medicine and Rehabilitation Why: Office will call you with follow up appointment Contact information: [id number] [place] [address] [phone]        Celene Squibb, MD. Call.   Specialty: Internal Medicine Why: for post hospital follow up Contact information: [facility] [address] [phone]        Kirsteins, Luanna Salk, MD Follow up.   Specialty: Physical Medicine and Rehabilitation  Why: Office will call  you with appointment for EMG/NCS  Contact information: [place] [address] [phone]        Phillips Odor, MD. Call.   Specialty: Neurology Why: for follow up appointment Contact information: [address] [place] [address] [phone]           Signed: Bary Sutton 12/28/2018, 11:33 PM

## 2018-12-27 NOTE — Progress Notes (Addendum)
[place] PHYSICAL MEDICINE & REHABILITATION PROGRESS NOTE   Subjective/Complaints:   Pt reports muscle are really sore- even has knot in thumb- L hand  ROS- pt denies SOB, CP, N/V/D.   Objective:   No results found. Recent Labs    12/25/18 0708  WBC 5.1  HGB 14.3  HCT 44.2  PLT 342   Recent Labs    12/25/18 0708  NA 136  K 4.3  CL 104  CO2 22  GLUCOSE 102*  BUN 21  CREATININE 0.83  CALCIUM 9.7    Intake/Output Summary (Last 24 hours) at 12/27/2018 0839 Last data filed at 12/26/2018 1750 Gross per 24 hour  Intake 480 ml  Output -  Net 480 ml     Physical Exam: Vital Signs Blood pressure 135/80, pulse 77, temperature (!) 97.4 F (36.3 C), temperature source Oral, resp. rate 16, height 5\' 3"  (1.6 m), weight 75.8 kg, SpO2 98 %.  Nursing note; labsand vitalsreviewed. Constitutional: awake, alert, appropriate; sitting EOB; NAD HENT:  Head:Normocephalicand atraumatic.  Eyes: conjugate gaze.EOMare normal.  Neck: much improved Cervical ROM Cardiovascular:Normal rateand regular rhythm. Respiratory:Effort normal. Norespiratory distress. She hasno wheezes.  [id number]. She exhibitsno distension. There isno abdominal tenderness.  Musculoskeletal:  General: No edema.  Comments: Right palm with sutures out!. Minimal tenderness to palpation.a little dried blood- no drainage; mild edema; no erythema- wearing CTS night splint.  Neurological: She is alertand oriented to person, place, and time. UE 5/5 except for right wrist/grip which is limited by recent surgery. RLE: 2-/5 HF, KE  2/5 and 4-/5 ADF/PF. LLE: 1/5 HF, KE 2/5 and 4-/5 ADF/PF. Decreased LT below the upper thigh prox to distal. Normal sensation in UE's less sl decrease in the right carpal tunnel distribution. DTR's absent.  Skin: Skin iswarmand dry. She isnot diaphoretic.  Psychiatric: bright affect- focused on having a good day MSK no tenderness to palpation of lumbar      Assessment/Plan: 1. Functional deficits secondary to Guillain Barre syndrome with paraparesis  which require 3+ hours per day of interdisciplinary therapy in a comprehensive inpatient rehab setting.  Physiatrist is providing close team supervision and 24 hour management of active medical problems listed below.  Physiatrist and rehab team continue to assess barriers to discharge/monitor patient progress toward functional and medical goals  Care Tool:  Bathing    Body parts bathed by patient: Front perineal area, Right arm, Chest, Left arm, Abdomen, Right upper leg, Left upper leg, Face, Buttocks, Right lower leg, Left lower leg   Body parts bathed by helper: Buttocks, Right lower leg, Left lower leg(back)     Bathing assist Assist Level: Contact Guard/Touching assist     Upper Body Dressing/Undressing Upper body dressing   What is the patient wearing?: Pull over shirt    Upper body assist Assist Level: Independent    Lower Body Dressing/Undressing Lower body dressing      What is the patient wearing?: Pants     Lower body assist Assist for lower body dressing: Supervision/Verbal cueing(bed level)     Toileting Toileting    Toileting assist Assist for toileting: Minimal Assistance - Patient > 75%     Transfers Chair/bed transfer  Transfers assist     Chair/bed transfer assist level: Supervision/Verbal cueing     Locomotion Ambulation   Ambulation assist   Ambulation activity did not occur: Safety/medical concerns          Walk 10 feet activity   Assist  Walk 10 feet activity did  not occur: Safety/medical concerns        Walk 50 feet activity   Assist Walk 50 feet with 2 turns activity did not occur: Safety/medical concerns         Walk 150 feet activity   Assist Walk 150 feet activity did not occur: Safety/medical concerns         Walk 10 feet on uneven surface  activity   Assist Walk 10 feet on uneven surfaces  activity did not occur: Safety/medical concerns         Wheelchair     Assist Will patient use wheelchair at discharge?: Yes Type of Wheelchair: Manual    Wheelchair assist level: Independent Max wheelchair distance: 150'    Wheelchair 50 feet with 2 turns activity    Assist        Assist Level: Independent   Wheelchair 150 feet activity     Assist  Wheelchair 150 feet activity did not occur: Safety/medical concerns   Assist Level: Independent   Blood pressure 135/80, pulse 77, temperature (!) 97.4 F (36.3 C), temperature source Oral, resp. rate 16, height 5\' 3"  (1.6 m), weight 75.8 kg, SpO2 98 %.   Medical Problem List and Plan: 1.Functional deficits and paraparesissecondary to GBS/?variant CIR PT, OT  No neurogenic bowel or bladder  10/13- will need a light weight w/c due to incomplete paraplegia due to her GBS- don't know is/when she will get LE function back, and due to her being 63 yrs old and female, she doesn't have as much UB strength to propel W/C, so needs light weight manual w/c, so can push self in community.     2. Antithrombotics: -DVT/anticoagulation:Pharmaceutical:Lovenox -antiplatelet therapy: N/A 3. Pain Management:Ultram prn             -pain control appears adequate at present  10/3- will add Lidocaine patches to neck 8pm to 8am for neck tightness  10/4- will change lidoderm patches to sides/hips per pt request.  Kpad and trolamine cream for low back ache  much better last noc   10/12- d/c'd lidocaine patches per pt request   10/13- trying muscle rub but burns  Mood:LCSW to follow for evaluation and support. -antipsychotic agents: N/A 5. Neuropsych: This patientiscapable of making decisions onherown behalf. 6. Skin/Wound Care:Routine pressure relief measures 7. Fluids/Electrolytes/Nutrition:Monitor I/O. Check lytes in am.  8. GERD: Continue PPI 9.Right carpel tunnel  release: Will order splintgiven increased need to utilize UE's for transfer, etc. Sutures to come out Monday?  10/12- sutures out last week   10. Bowel and bladder: -appears to be continent -improved after metamucil   10/8- will give a dose of Miralax- if doesn't work today, will do mg citrate tomorrow.   10/9- had good BM with miralax and Linzess- plans to take miralax daily.  11. Dispo   10/14- d/c today- f/u in 1-2 weeks.  Patient has a diagnosis of Guillain Barre syndrome, that requires a manual wheelchair because she's unable to walk- she requires an ultra light weight - wheelchair due to being dependent on a wheelchair as her only mode of propulsion/getting around in the community and at home- needs specifically due to having severe LE weakness from her Guillain Barre syndrome   This diagnosis is expected to be lifelong and unlikely but possible to change.    LOS: 12 days A FACE TO FACE EVALUATION WAS PERFORMED  [employer] 12/27/2018, 8:39 AM

## 2018-12-27 NOTE — Progress Notes (Signed)
Occupational Therapy Session Note  Patient Details  Name: Paula Sutton MRN: [id number] Date of Birth: Dec 24, 1955  Today's Date: 12/27/2018 OT Individual Time: 1030-1110 OT Individual Time Calculation (min): 40 min    Short Term Goals: Week 2:  OT Short Term Goal 1 (Week 2): STG=LTG due to LOS  Skilled Therapeutic Interventions/Progress Updates:    Pt resting in w/c upon arrival and agreeable to therapy.  Pt requested to practice transfers and use NuStep.  Requested to avoid BUE therex 2/2 increased pain in LLU (upper arm). OT intervention with focus on BLE strengthening, w/c mobility, and functional slide board transfers.  8 min NuStep at level 1 with emphasis on BLE use and minimal BUE use.  All transfers with supervision after board placement.  Pt assisted with placement of board.  W/c mobility independent.  Pt pleased with progress and ready for discharge after therapy today.   Therapy Documentation Precautions:  Precautions Precautions: Fall Restrictions Weight Bearing Restrictions: No Pain: Pain Assessment Pain Score: 6  Pain Location: Arm Pain Orientation: Left Pain Descriptors / Indicators: Aching Pain Intervention(s): limit UE activity; emotional support  Therapy/Group: Individual Therapy  Leroy Libman 12/27/2018, 11:13 AM

## 2018-12-27 NOTE — Discharge Instructions (Signed)
Inpatient Rehab Discharge Instructions  Paula Sutton Discharge date and time:  12/27/18  Activities/Precautions/ Functional Status: Activity: no lifting, driving, or strenuous exercise till cleared by MD Diet: regular diet Wound Care: as directed   Functional status:  ___ No restrictions     ___ Walk up steps independently _X__ 24/7 supervision/assistance   ___ Walk up steps with assistance ___ Intermittent supervision/assistance  ___ Bathe/dress independently ___ Walk with walker     ___ Bathe/dress with assistance ___ Walk Independently    ___ Shower independently ___ Walk with assistance    _X__ Shower with assistance _X__ No alcohol     ___ Return to work/school ________  COMMUNITY REFERRALS UPON DISCHARGE:    Home Health:   PT     OT                       Agency:  [facility]     Phone: [phone]   Medical Equipment/Items Ordered:  Wheelchair, cushion, commode and transfer board                                                      Agency/Supplier:  [employer] @ [phone]  Special Instructions: 1.  Wear your brace on right wrist.    My questions have been answered and I understand these instructions. I will adhere to these goals and the provided educational materials after my discharge from the hospital.  Patient/Caregiver Signature _______________________________ Date __________  Clinician Signature _______________________________________ Date __________  Please bring this form and your medication list with you to all your follow-up doctor's appointments.

## 2018-12-29 ENCOUNTER — Telehealth: Payer: Self-pay

## 2018-12-29 NOTE — Telephone Encounter (Signed)
[place]  Patient Name: Paula Sutton DOB: 1955-09-21 Appointment Date and Time: 01-04-2019 / 220pm With: Dr. Dagoberto Ligas  Questions for our staff to ask patients on Transitional care 48 hour phone call:   1. Are you/is patient experiencing any problems since coming home? NO     Are there any questions regarding any aspect of care? YES  2. Are there any questions regarding medications administration/dosing? NO     Are meds being taken as prescribed? YES  3. Have there been any falls? NO  4. Has Home Health been to the house and/or have they contacted you? YES     If not, have you tried to contact them? NA     Can we help you contact them? NA  5. Are bowels and bladder emptying properly? YES     Are there any unexpected incontinence issues? NO     If applicable, is patient following bowel/bladder programs? NA  6. Any fevers, problems with breathing, unexpected pain? NO  7. Are there any skin problems or new areas of breakdown? NO  8. Has the patient/family member arranged specialty MD follow up (ie cardiology/neurology/renal/surgical/etc)? YES     Can we help arrange? NOT AT THIS TIME  9. Does the patient need any other services or support that we can help arrange? NO  10. Are caregivers following through as expected in assisting the patient? YES  11. Has the patient quit smoking, drinking alcohol, or using drugs as recommended? NA  [facility] [address] [phone]

## 2019-01-02 ENCOUNTER — Encounter: Payer: 59 | Admitting: Physical Medicine and Rehabilitation

## 2019-01-04 ENCOUNTER — Encounter: Payer: 59 | Admitting: Physical Medicine and Rehabilitation

## 2019-01-10 ENCOUNTER — Telehealth: Payer: Self-pay | Admitting: *Deleted

## 2019-01-10 NOTE — Telephone Encounter (Signed)
Patient left a message asking for a refill on tramadol.  Her 01/02/2019 appointment was rescheduled due to provider illness. She is also considered about a request for a wheelchair that is in limbo as well and is wondering if someone else here can take that over while the provider is out.

## 2019-01-11 MED ORDER — TRAMADOL HCL 50 MG PO TABS: 50.0000 mg | ORAL_TABLET | Freq: Four times a day (QID) | ORAL | 0 refills | Status: DC | PRN

## 2019-01-11 NOTE — Telephone Encounter (Signed)
Did her w/c stuff today- personally handed to w/c company rep.  Will refill her tramadol.

## 2019-01-15 ENCOUNTER — Telehealth: Payer: Self-pay | Admitting: *Deleted

## 2019-01-15 NOTE — Telephone Encounter (Addendum)
Prior Authorization submitted to [facility] via Covermymeds for Tramadol   Approved, 01/15/2019-01/15/2020

## 2019-01-17 ENCOUNTER — Other Ambulatory Visit (HOSPITAL_COMMUNITY): Payer: Self-pay | Admitting: Neurology

## 2019-01-17 ENCOUNTER — Telehealth: Payer: Self-pay

## 2019-01-17 ENCOUNTER — Other Ambulatory Visit: Payer: Self-pay | Admitting: Neurology

## 2019-01-17 DIAGNOSIS — G8194 Hemiplegia, unspecified affecting left nondominant side: Secondary | ICD-10-CM

## 2019-01-17 DIAGNOSIS — R519 Headache, unspecified: Secondary | ICD-10-CM

## 2019-01-17 NOTE — Telephone Encounter (Signed)
Patient called stating that she is in the process of getting a wheelchair from [facility] but they are not in network with her insurance. She found medical supply that is. Called pt back to get more detailed info, no answer, left message.

## 2019-01-18 ENCOUNTER — Encounter: Payer: Self-pay | Admitting: Physical Medicine & Rehabilitation

## 2019-01-18 ENCOUNTER — Other Ambulatory Visit: Payer: Self-pay

## 2019-01-18 ENCOUNTER — Encounter: Payer: 59 | Attending: Physical Medicine & Rehabilitation | Admitting: Physical Medicine & Rehabilitation

## 2019-01-18 VITALS — BP 115/72 | HR 88 | Temp 97.5°F | Ht 63.0 in | Wt 160.0 lb

## 2019-01-18 DIAGNOSIS — G5601 Carpal tunnel syndrome, right upper limb: Secondary | ICD-10-CM | POA: Diagnosis present

## 2019-01-18 DIAGNOSIS — G610 Guillain-Barre syndrome: Secondary | ICD-10-CM

## 2019-01-18 DIAGNOSIS — R29898 Other symptoms and signs involving the musculoskeletal system: Secondary | ICD-10-CM | POA: Insufficient documentation

## 2019-01-18 NOTE — Progress Notes (Signed)
63 y.o. female with history of OSA, GERD, recent CTR right hand was admitted on 12/08/2018 with sudden onset of BLE weakness with severe spasms and paresthesias.  She was admitted to [facility] for work-up and MRI of spine showed mild DDD with facet disease of lumbar spine moderate right neural foraminal stenosis C7-T1 with diffuse cervical spondylosis and bilateral foraminal stenosis C 4 to C5.  She was found to have leukocytosis and LP done revealing elevated protein with elevated glucose.  No oligoclonal bands seen.  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  She has had some recovery in RLE but continues to have paraplegia   Pt referred for EMG /NCV  Finding c/w AMAN See EMG under media tab

## 2019-01-18 NOTE — Patient Instructions (Signed)
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

## 2019-01-19 ENCOUNTER — Other Ambulatory Visit: Payer: Self-pay

## 2019-01-19 ENCOUNTER — Encounter (HOSPITAL_BASED_OUTPATIENT_CLINIC_OR_DEPARTMENT_OTHER): Payer: 59 | Admitting: Physical Medicine and Rehabilitation

## 2019-01-19 ENCOUNTER — Encounter: Payer: Self-pay | Admitting: Physical Medicine and Rehabilitation

## 2019-01-19 VITALS — BP 126/78 | HR 90 | Temp 97.7°F | Ht 63.0 in | Wt 160.0 lb

## 2019-01-19 DIAGNOSIS — R29898 Other symptoms and signs involving the musculoskeletal system: Secondary | ICD-10-CM | POA: Diagnosis not present

## 2019-01-19 DIAGNOSIS — G5601 Carpal tunnel syndrome, right upper limb: Secondary | ICD-10-CM | POA: Diagnosis not present

## 2019-01-19 DIAGNOSIS — G610 Guillain-Barre syndrome: Secondary | ICD-10-CM | POA: Insufficient documentation

## 2019-01-19 MED ORDER — LINACLOTIDE 72 MCG PO CAPS: 72.0000 ug | ORAL_CAPSULE | Freq: Every day | ORAL | 1 refills | Status: DC

## 2019-01-19 NOTE — Patient Instructions (Addendum)
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   1. Nerve pain- Gabapentin 300 mg nightly x 3-4 days then 300 mg 3x/day x  3-4 days then 600 mg 2x/day.  2. Got refill of Tramadol last week, so doesn't need refill.  3. Ready for me to write for Outpatient PT- wrote referral to outpatient Neurorehab- 2-3x/week  4. Renewed 72 mcg Linzess since can't get in with GI for now- will give a total of 2 months so she can see them soon and get from GI in future.  5. Stalls not in their network for insurance.   I suggest [employer]- L/m for Deberah Pelton- to try and get w/c from them.  Will get copy of w/c order from [employer].  6. F/U in 2 months

## 2019-01-19 NOTE — Progress Notes (Signed)
Subjective:    Patient ID: Paula Sutton, female    DOB: [birth year], 63 y.o.   MRN: [id number]  HPI   CC: Guillain Barre syndrome- motor variant.  Still having burning in feet - constant- at night much worse- right now 2/10 and nighttime 5-6/10 and the side pain  Has moved- is now more under navel on stomach/abdomen. Sensation when cooling burning sensation- esp when puts something cold on leg or any pressure on leg.   Has Rx for nerve pain/Gabapentin at home-only took 1 tab- for head pain that had, then stopped because head pain went away.    Has a brain MRI next Wednesday- since had a severe HA/pain in R side of head that lasted awhile- so Neurology ordered the MRI.  Saw Chiropractor a few days ago- did adjustment- felt great.  Can sleep 12+ hours/day- so noticed decreased endurance.   Done with H/H; ready for outpatient. Wants to come to [facility] for rehab Walked 120 ft with RW- tries every day to walk with therapy (initially walked 14 ft, then increased). Still standing at sink to do some therapy; L side stronger than R; better balance. Stood up to pull pants up for first time. Only time using sliding board to get in the car, can even get OUT of the car without sliding board.    Pain Inventory Average Pain 2 Pain Right Now 2 My pain is dull and aching  In the last 24 hours, has pain interfered with the following? General activity 0 Relation with others 0 Enjoyment of life 0 What TIME of day is your pain at its worst? daytime Sleep (in general) Fair  Pain is worse with: some activites Pain improves with: rest Relief from Meds: .  Mobility walk with assistance use a wheelchair needs help with transfers  Function Do you have any goals in this area?  no  Neuro/Psych No problems in this area  Prior Studies Any changes since last visit?  no  Physicians involved in your care Any changes since last visit?  no   Family History  Problem Relation Age of  Onset  . Alcohol abuse Mother   . Colon cancer Neg Hx   . Breast cancer Neg Hx    Social History   Socioeconomic History  . Marital status: Married    Spouse name: Not on file  . Number of children: Not on file  . Years of education: Not on file  . Highest education level: Not on file  Occupational History  . Occupation: Retired     Comment: Glass blower/designer  Social Needs  . Financial resource strain: Not on file  . Food insecurity    Worry: Not on file    Inability: Not on file  . Transportation needs    Medical: Not on file    Non-medical: Not on file  Tobacco Use  . Smoking status: Former Smoker    Types: Cigarettes    Quit date: 01/27/1971    Years since quitting: 48.0  . Smokeless tobacco: Never Used  Substance and Sexual Activity  . Alcohol use: Yes    Comment: Occasional: 1-2 times/month  . Drug use: Yes    Frequency: 7.0 times per week    Types: Marijuana  . Sexual activity: Not on file  Lifestyle  . Physical activity    Days per week: Not on file    Minutes per session: Not on file  . Stress: Not on file  Relationships  .  Social Herbalist on phone: Not on file    Gets together: Not on file    Attends religious service: Not on file    Active member of club or organization: Not on file    Attends meetings of clubs or organizations: Not on file    Relationship status: Not on file  Other Topics Concern  . Not on file  Social History Narrative   Lives at home with husband.   Past Surgical History:  Procedure Laterality Date  . ABDOMINAL HYSTERECTOMY    . BIOPSY  08/09/2017   Procedure: BIOPSY;  Surgeon: Danie Binder, MD;  Location: [facility];  Service: Endoscopy;;  gastric biopsy for h pylori  . CARPAL TUNNEL RELEASE Right 12/07/2018  . CATARACT EXTRACTION, BILATERAL     DID 1 YEAR APART , LAST ONE WAS 12-2017  . COLONOSCOPY WITH ESOPHAGOGASTRODUODENOSCOPY (EGD)  2014   [place] PROPOFOL N/A  08/09/2017   3 simple adenomas, diverticulosis in recto-sigmoid, sigmoid, and descending colin, moderate external and internal hemorrhoids  . ESOPHAGOGASTRODUODENOSCOPY (EGD) WITH PROPOFOL N/A 08/09/2017   Low-grade narrowing Schatzki ring due to GERD; small hiatal hernia; mild gastritis and duodenitis due to NSAIDs; biopsy with gastritis  . KNEE ARTHROSCOPY Right 11/2017   WITH DR Wynelle Link  AT [place] Right   . POLYPECTOMY  08/09/2017   Procedure: POLYPECTOMY;  Surgeon: Danie Binder, MD;  Location: [facility];  Service: Endoscopy;;  cecal polyp hs, transverse colon polyp hs, rectal polyp cs  . TOTAL KNEE REVISION Right 03/01/2018   Procedure: RIGHT TOTAL KNEE REVISION;  Surgeon: Gaynelle Arabian, MD;  Location: [facility];  Service: Orthopedics;  Laterality: Right;  162min   Past Medical History:  Diagnosis Date  . Arthritis    knee  . Dyslipidemia   . GERD (gastroesophageal reflux disease)   . Left anterior fascicular block    PATIENT SAYS THIS SHOWED ON EKG DURING COLONOSCOPY , WAS REFERRED TO CARDIOLOGY DR. Jeneen Rinks HOCHREIN  (SEE EPIC ENCOUNTER [phone])      BP 126/78   Pulse 90   Temp 97.7 F (36.5 C)   Ht 5\' 3"  (1.6 m)   Wt 160 lb (72.6 kg)   SpO2 97%   BMI 28.34 kg/m   Opioid Risk Score:   Fall Risk Score:  `1  Depression screen PHQ 2/9  No flowsheet data found.   Review of Systems  Constitutional: Negative.   HENT: Negative.   Eyes: Negative.   Respiratory: Negative.   Cardiovascular: Negative.   Gastrointestinal: Negative.   Endocrine: Negative.   Genitourinary: Negative.   Musculoskeletal: Positive for gait problem.  Skin: Negative.   Allergic/Immunologic: Negative.   Neurological: Positive for weakness.  Hematological: Negative.   Psychiatric/Behavioral: Negative.   All other systems reviewed and are negative.      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   1. Nerve pain- Gabapentin 300 mg nightly x 3-4 days then 300 mg  3x/day x  3-4 days then 600 mg 2x/day.  2. Got refill of Tramadol last week, so doesn't need refill.  3. Ready for me to write for Outpatient PT- wrote referral to outpatient Neurorehab- 2-3x/week  4. Renewed 72 mcg Linzess since can't get in with GI for now- will give a total of 2 months so she can see them soon and get from GI in future.  5. Stalls not in their network for insurance.   I suggest [employer]- L/m for Deberah Pelton- to try and get w/c from them.  Will get copy of w/c order from [employer]. Fax number- [phone]  D/w Erlene Quan specifically on how to get W/C via [employer] from [place].   6. F/U in 2 months  I spent a total of 25 minutes on appointment- more than 15 minutes  discussing w/c issues, how to get new w/c, nerve pain, etc as above.

## 2019-01-24 ENCOUNTER — Ambulatory Visit (HOSPITAL_COMMUNITY)
Admission: RE | Admit: 2019-01-24 | Discharge: 2019-01-24 | Disposition: A | Discharge status: Home / Self Care | Payer: 59 | Source: Ambulatory Visit | Attending: Neurology | Admitting: Neurology

## 2019-01-24 ENCOUNTER — Other Ambulatory Visit: Payer: Self-pay

## 2019-01-24 DIAGNOSIS — G8194 Hemiplegia, unspecified affecting left nondominant side: Secondary | ICD-10-CM | POA: Diagnosis present

## 2019-01-24 DIAGNOSIS — R519 Headache, unspecified: Secondary | ICD-10-CM | POA: Diagnosis present

## 2019-01-26 ENCOUNTER — Ambulatory Visit: Payer: 59 | Attending: Physical Medicine and Rehabilitation | Admitting: Physical Therapy

## 2019-01-26 ENCOUNTER — Other Ambulatory Visit: Payer: Self-pay

## 2019-01-26 DIAGNOSIS — M6281 Muscle weakness (generalized): Secondary | ICD-10-CM | POA: Insufficient documentation

## 2019-01-26 DIAGNOSIS — R2689 Other abnormalities of gait and mobility: Secondary | ICD-10-CM | POA: Insufficient documentation

## 2019-01-26 DIAGNOSIS — R262 Difficulty in walking, not elsewhere classified: Secondary | ICD-10-CM | POA: Diagnosis present

## 2019-01-26 DIAGNOSIS — R29818 Other symptoms and signs involving the nervous system: Secondary | ICD-10-CM | POA: Diagnosis present

## 2019-01-26 NOTE — Therapy (Addendum)
Paula Sutton [address] [place], [place], [address] Phone: [phone]   Fax:  [phone]  Physical Therapy Evaluation  Patient Details  Name: Paula Sutton MRN: [id number] Date of Birth: 17-Jan-1956 Referring Provider (PT): Courtney Heys, MD   Encounter Date: 01/26/2019  PT End of Session - 01/26/19 1203    Visit Number  1    Number of Visits  28    Date for PT Re-Evaluation  04/26/19    Authorization Type  [employer]    PT Start Time  1022    PT Stop Time  1102    PT Time Calculation (min)  40 min    Equipment Utilized During Treatment  Gait belt    Activity Tolerance  Patient tolerated treatment well    Behavior During Therapy  WFL for tasks assessed/performed       Past Medical History:  Diagnosis Date  . Arthritis    knee  . Dyslipidemia   . GERD (gastroesophageal reflux disease)   . Left anterior fascicular block    PATIENT SAYS THIS SHOWED ON EKG DURING COLONOSCOPY , WAS REFERRED TO CARDIOLOGY DR. Minus Sutton  (SEE EPIC ENCOUNTER 09-2017)       Past Surgical History:  Procedure Laterality Date  . ABDOMINAL HYSTERECTOMY    . BIOPSY  08/09/2017   Procedure: BIOPSY;  Surgeon: Danie Binder, MD;  [facility];  Service: Endoscopy;;  gastric biopsy for h pylori  . CARPAL TUNNEL RELEASE Right 12/07/2018  . CATARACT EXTRACTION, BILATERAL     DID 1 YEAR APART , LAST ONE WAS 12-2017  . COLONOSCOPY WITH ESOPHAGOGASTRODUODENOSCOPY (EGD)  2014   [place] PROPOFOL N/A 08/09/2017   3 simple adenomas, diverticulosis in recto-sigmoid, sigmoid, and descending colin, moderate external and internal hemorrhoids  . ESOPHAGOGASTRODUODENOSCOPY (EGD) WITH PROPOFOL N/A 08/09/2017   Low-grade narrowing Schatzki ring due to GERD; small hiatal hernia; mild gastritis and duodenitis due to NSAIDs; biopsy with gastritis  . KNEE ARTHROSCOPY Right 11/2017   WITH DR Wynelle Link   AT [place] Right   . POLYPECTOMY  08/09/2017   Procedure: POLYPECTOMY;  Surgeon: Danie Binder, MD;  [facility];  Service: Endoscopy;;  cecal polyp hs, transverse colon polyp hs, rectal polyp cs  . TOTAL KNEE REVISION Right 03/01/2018   Procedure: RIGHT TOTAL KNEE REVISION;  Surgeon: Gaynelle Arabian, MD;  Location: [facility];  Service: Orthopedics;  Laterality: Right;  162min    There were no vitals filed for this visit.   Subjective Assessment - 01/26/19 1025    Subjective  Was discharged from the hospital 12/27/18. Has been doing walking at home with the RW with home health PT. Now only needs the slideboard getting out of the car. No longer transferring with the slideboard at home for level surfaces - can perform stand step or stand pivot transfers. Has burning in bilateral feet, worse at night. Has intermittent headaches R>L, was at its worst 2 weeks she went to the hospital and diagnosed with GBS. Still weaker in RLE, has numbness in BLE. [employer] is coming to house today for a lighter weight w/c.    Pertinent History  history of OSA, GERD, recent CTR right hand was admitted on 12/08/2018 with sudden onset of BLE weakness with severe spasms and paresthesias - diagnosed with GBS    How long can you walk comfortably?  10-15 minutes or 260'  Diagnostic tests  MRI - had imaging done on brain last week, waiting for results -Had  a nerve conduction test last week: 50% on L, 25% on R    Patient Stated Goals  wants to walk completely without a RW    Currently in Pain?  No/denies         [facility] PT Assessment - 01/26/19 1033      Assessment   Medical Diagnosis  GBS    Referring Provider (PT)  Courtney Heys, MD    Onset Date/Surgical Date  12/08/18    Hand Dominance  Right    Prior Therapy  [facility] discharged on 12/27/18, HHPT      Precautions   Precautions  Fall      Balance Screen   Has the patient fallen in the past 6 months  No    Has the patient had a  decrease in activity level because of a fear of falling?   No    Is the patient reluctant to leave their home because of a fear of falling?   No      Home Environment   Living Environment  Private residence    Living Arrangements  Spouse/significant other   Paula Sutton, husband    Available Help at Discharge  Family    Type of [place] entrance   before had 2 steps    Home Layout  One level    Home Equipment  Wheelchair - manual;Walker - standard;Cane - single point;Bedside commode;Shower seat      Prior Function   Level of Independence  Independent    Leisure  likes sewing (quilting)      Cognition   Overall Cognitive Status  Within Functional Limits for tasks assessed      Sensation   Light Touch  Appears Intact    Proprioception  Appears Intact   B ankles     Coordination   Gross Motor Movements are Fluid and Coordinated  No    Heel Shin Test  unable to perform due to weakness      ROM / Strength   AROM / PROM / Strength  Strength      Strength   Right Hip Flexion  2-/5    Left Hip Flexion  2/5    Right Knee Flexion  3+/5    Right Knee Extension  2/5    Left Knee Flexion  3/5    Left Knee Extension  3+/5    Right Ankle Dorsiflexion  4/5    Left Ankle Dorsiflexion  4+/5      Transfers   Transfers  Stand Pivot Transfers;Squat Pivot Transfers;Sit to Stand;Stand to Sit    Sit to Stand  4: Min guard   pt using BUE support to stand from w/c to RW   Five time sit to stand comments   34.34 seconds from w/c with RW, needs BUE support    Stand Pivot Transfers  4: Min guard   from w/c <> mat table    Squat Pivot Transfers  5: Supervision   from w/c <> mat table      Ambulation/Gait   Ambulation/Gait  Yes    Ambulation/Gait Assistance  4: Min guard    Ambulation Distance (Feet)  115 Feet   approx   Assistive device  Rolling walker    Gait Pattern  Step-through pattern;Decreased weight shift to right;Trunk flexed;Poor foot clearance -  right;Decreased hip/knee flexion - right;Decreased  hip/knee flexion - left;Decreased dorsiflexion - right;Decreased dorsiflexion - left    Ambulation Surface  Indoor;Level    Gait velocity  35.53 seconds = .92 ft/sec      Standardized Balance Assessment   Standardized Balance Assessment  Timed Up and Go Test      Timed Up and Go Test   Normal TUG (seconds)  39.69   with RW               Objective measurements completed on examination: See above findings.              PT Education - 01/26/19 1202    Education Details  clinical findings, POC    Person(s) Educated  Patient;Spouse    Methods  Explanation    Comprehension  Verbalized understanding         PT Short Term Goals - 01/29/19 2148      PT SHORT TERM GOAL #1   Title  Patient will be independent with initial HEP in order to build upon functional gains in therapy. ALL STGS DUE 02/26/19    Time  4    Period  Weeks    Status  New    Target Date  02/26/19      PT SHORT TERM GOAL #2   Title  Patient will increase baseline BERG score by at least 5 points in order to demo decreased fall risk.    Baseline  not yet assessed.    Time  4    Period  Weeks    Status  New      PT SHORT TERM GOAL #3   Title  Patient will decrease 5 times sit <> stand score to at least 24 seconds with single UE support in order to improve functional LE strength.    Baseline  34.34 seconds with BUE    Time  4    Period  Weeks    Status  New      PT SHORT TERM GOAL #4   Title  Patient will ambulate at least 62' with LRAD and supervision in order to improve endurance for gait.    Time  4    Period  Weeks    Status  New      PT SHORT TERM GOAL #5   Title  Patient will increase gait speed to at least 1.7 ft/sec in order to decrease risk of falls.    Baseline  .65 ft/sec with RW    Time  4    Period  Weeks    Status  New        PT Long Term Goals - 01/29/19 2151      PT LONG TERM GOAL #1   Title  Patient will be  independent with final HEP in order to build upon functional gains in therapy. ALL STGS DUE 04/23/19    Time  12    Period  Weeks    Status  New    Target Date  04/23/19      PT LONG TERM GOAL #2   Title  Patient will increase baseline BERG score by at least 10 points in order to demo decreased fall risk.    Time  12    Period  Weeks    Status  New      PT LONG TERM GOAL #3   Title  Patient will ambulate at least 300' outdoors over unlevel surfaces with LRAD and supervision in order to improve community mobility.  Time  12    Period  Weeks    Status  New      PT LONG TERM GOAL #4   Title  Patient will perform TUG in 18 seconds or less with LRAD in order to demo decreased fall risk.    Time  12    Period  Weeks    Status  New      PT LONG TERM GOAL #5   Title  Patient will perform 5x sit <> stand in 18 seconds or less without UE support in order to demo improved LE strength.    Time  12    Period  Weeks    Status  New      Additional Long Term Goals   Additional Long Term Goals  Yes      PT LONG TERM GOAL #6   Title  Patient will improve gait speed to at least 2.6 ft/sec using LRAD in order to demo decreased fall risk and improved community mobility.    Time  12    Period  Weeks    Status  New            01/26/19 1234  Plan  Clinical Impression Statement Patient is a 63 year old female referred to Neuro OPPT for evaluation s/p recent diagnosis of GBS (12/08/18). Pt's PMH is significant for: OSA, GERD, recent CTR right hand.  The following deficits were present during the exam: decreased LE strength (R>L), decreased endurance, gait abnormalities, LE numbness, impaired balance. Pt's gait speed and 5x sit <> stand scores indicate pt is at a high risk for falls.  Pt would benefit from skilled PT to address these impairments and functional limitations to maximize functional mobility independence.  Personal Factors and Comorbidities Comorbidity 2;Past/Current Experience   Comorbidities OSA, GERD  Examination-Activity Limitations Locomotion Level;Transfers;Stairs;Stand  Examination-Participation Restrictions Community Activity  Pt will benefit from skilled therapeutic intervention in order to improve on the following deficits Abnormal gait;Decreased activity tolerance;Decreased coordination;Decreased balance;Decreased endurance;Decreased strength;Difficulty walking;Decreased mobility  Stability/Clinical Decision Making Evolving/Moderate complexity  Clinical Decision Making Moderate  Rehab Potential Good  PT Frequency 3x / week (followed by 2x week for an additional 8 weeks)  PT Duration 4 weeks (followed by 2x week for an additional 8 weeks)  PT Treatment/Interventions ADLs/Self Care Home Management;Electrical Stimulation;Aquatic Therapy;Gait training;DME Instruction;Stair training;Functional mobility training;Therapeutic activities;Therapeutic exercise;Patient/family education;Neuromuscular re-education;Balance training;Passive range of motion  PT Next Visit Plan perform BERG, initial HEP for balance/LE strengthening.  Recommended Other Services possible OT consult?  Consulted and Agree with Plan of Care Patient;Family member/caregiver  Family Member Consulted pt's husband Paula Sutton          Patient will benefit from skilled therapeutic intervention in order to improve the following deficits and impairments:     Visit Diagnosis: Difficulty in walking, not elsewhere classified  Muscle weakness (generalized)  Other abnormalities of gait and mobility  Other symptoms and signs involving the nervous system     Problem List Patient Active Problem List   Diagnosis Date Noted  . Guillain Barr syndrome ([place]) 01/19/2019  . Cervical myofascial pain syndrome 12/16/2018  . Carpal tunnel syndrome 12/16/2018  . Axonal GBS (Guillain-Barre syndrome) ([place]) 12/15/2018  . Leg weakness, bilateral 12/08/2018  . IBS (irritable bowel syndrome) 12/08/2018  .  GERD (gastroesophageal reflux disease) 09/14/2018  . 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 Abdominal pain, epigastric 06/03/2017  . Constipation 01/27/2016  . History of adenomatous polyp of colon 01/27/2016  Arliss Journey, PT, DPT  01/26/2019, 12:34 PM  [facility] [address] [place], [place], [address] Phone: [phone]   Fax:  [phone]  Name: AUNDREYA GOLLIDAY MRN: [id number] Date of Birth: 1955-06-21

## 2019-01-29 NOTE — Addendum Note (Signed)
Addended by: Arliss Journey on: 01/29/2019 09:56 PM   Modules accepted: Orders

## 2019-01-31 ENCOUNTER — Telehealth: Payer: Self-pay | Admitting: *Deleted

## 2019-01-31 NOTE — Telephone Encounter (Signed)
Patient left a message stating she cannot tolerate gabapentin 600 mg. Makes her pass out.  Makes her legs feel like jello and want to give out.  Asking for Dr Imelda Pillow advice, possible reduction or alternative medication. Please advise

## 2019-02-01 ENCOUNTER — Encounter: Payer: Self-pay | Admitting: Rehabilitation

## 2019-02-01 ENCOUNTER — Ambulatory Visit: Payer: 59 | Admitting: Rehabilitation

## 2019-02-01 ENCOUNTER — Other Ambulatory Visit: Payer: Self-pay

## 2019-02-01 DIAGNOSIS — R262 Difficulty in walking, not elsewhere classified: Secondary | ICD-10-CM | POA: Diagnosis not present

## 2019-02-01 DIAGNOSIS — R2689 Other abnormalities of gait and mobility: Secondary | ICD-10-CM

## 2019-02-01 DIAGNOSIS — M6281 Muscle weakness (generalized): Secondary | ICD-10-CM

## 2019-02-01 DIAGNOSIS — R29818 Other symptoms and signs involving the nervous system: Secondary | ICD-10-CM

## 2019-02-01 NOTE — Patient Instructions (Signed)
Access Code: [email or web address]  URL: [email or web address]  Date: 02/01/2019  Prepared by: Cameron Sprang   Exercises Supine Heel Slides - 10 reps - 1 sets - 2x daily - 7x weekly Clamshell - 10 reps - 1 sets - 2x daily - 7x weekly Beginner Bridge - 10 reps - 1 sets - 2x daily - 7x weekly Hooklying Isometric Clamshell - 10 reps - 1 sets - 2x daily - 7x weekly

## 2019-02-01 NOTE — Telephone Encounter (Signed)
Pt splitting gabapentin in half- taking 300 mg - she asked that I call her back Friday to discuss other med choices.

## 2019-02-01 NOTE — Therapy (Signed)
[facility] [address] [place], [place], [address] Phone: [phone]   Fax:  [phone]  Physical Therapy Treatment  Patient Details  Name: Paula Sutton MRN: [id number] Date of Birth: 05-02-1955 Referring Provider (PT): Courtney Heys, MD   Encounter Date: 02/01/2019  PT End of Session - 02/01/19 1545    Visit Number  2    Number of Visits  28    Date for PT Re-Evaluation  04/26/19    Authorization Type  [employer]    PT Start Time  1408    PT Stop Time  1448    PT Time Calculation (min)  40 min    Equipment Utilized During Treatment  Gait belt    Activity Tolerance  Patient tolerated treatment well    Behavior During Therapy  [facility] for tasks assessed/performed       Past Medical History:  Diagnosis Date  . Arthritis    knee  . Dyslipidemia   . GERD (gastroesophageal reflux disease)   . Left anterior fascicular block    PATIENT SAYS THIS SHOWED ON EKG DURING COLONOSCOPY , WAS REFERRED TO CARDIOLOGY DR. Minus Breeding  (SEE EPIC ENCOUNTER 09-2017)       Past Surgical History:  Procedure Laterality Date  . ABDOMINAL HYSTERECTOMY    . BIOPSY  08/09/2017   Procedure: BIOPSY;  Surgeon: Danie Binder, MD;  [facility];  Service: Endoscopy;;  gastric biopsy for h pylori  . CARPAL TUNNEL RELEASE Right 12/07/2018  . CATARACT EXTRACTION, BILATERAL     DID 1 YEAR APART , LAST ONE WAS 12-2017  . COLONOSCOPY WITH ESOPHAGOGASTRODUODENOSCOPY (EGD)  2014   [place] PROPOFOL N/A 08/09/2017   3 simple adenomas, diverticulosis in recto-sigmoid, sigmoid, and descending colin, moderate external and internal hemorrhoids  . ESOPHAGOGASTRODUODENOSCOPY (EGD) WITH PROPOFOL N/A 08/09/2017   Low-grade narrowing Schatzki ring due to GERD; small hiatal hernia; mild gastritis and duodenitis due to NSAIDs; biopsy with gastritis  . KNEE ARTHROSCOPY Right 11/2017   WITH DR Wynelle Link  AT  [place] Right   . POLYPECTOMY  08/09/2017   Procedure: POLYPECTOMY;  Surgeon: Danie Binder, MD;  [facility];  Service: Endoscopy;;  cecal polyp hs, transverse colon polyp hs, rectal polyp cs  . TOTAL KNEE REVISION Right 03/01/2018   Procedure: RIGHT TOTAL KNEE REVISION;  Surgeon: Gaynelle Arabian, MD;  [facility];  Service: Orthopedics;  Laterality: Right;  179min    There were no vitals filed for this visit.  Subjective Assessment - 02/01/19 1416    Subjective  Walking more at home with walker, using w/c less unless having some back pain.    Pertinent History  GBS (diagnosed 9/20) history of OSA, GERD, recent CTR right hand    How long can you walk comfortably?  10-15 minutes or 260'    Diagnostic tests  MRI - had imaging done on brain last week, waiting for results -Had  a nerve conduction test last week: 50% on L, 25% on R    Patient Stated Goals  wants to walk completely without a RW    Currently in Pain?  Yes    Pain Score  6     Pain Location  Back    Pain Orientation  Right    Pain Descriptors / Indicators  Aching    Pain Type  Acute pain    Pain Radiating  Towards  more into R leg.    Pain Onset  More than a month ago    Pain Frequency  Intermittent    Aggravating Factors   walking/standing    Pain Relieving Factors  rest                       [facility] Adult PT Treatment/Exercise - 02/01/19 1421      Standardized Balance Assessment   Standardized Balance Assessment  Berg Balance Test      Berg Balance Test   Sit to Stand  Able to stand  independently using hands    Standing Unsupported  Able to stand 2 minutes with supervision    Sitting with Back Unsupported but Feet Supported on Floor or Stool  Able to sit safely and securely 2 minutes    Stand to Sit  Controls descent by using hands    Transfers  Able to transfer with verbal cueing and /or supervision    Standing Unsupported with Eyes Closed  Able to stand 10  seconds with supervision    Standing Ubsupported with Feet Together  Needs help to attain position but able to stand for 30 seconds with feet together    From Standing, Reach Forward with Outstretched Arm  Can reach forward >12 cm safely (5")   8"   From Standing Position, Pick up Object from Floor  Able to pick up shoe, needs supervision    From Standing Position, Turn to Look Behind Over each Shoulder  Turn sideways only but maintains balance    Turn 360 Degrees  Needs assistance while turning    Standing Unsupported, Alternately Place Feet on Step/Stool  Needs assistance to keep from falling or unable to try    Standing Unsupported, One Foot in Front  Needs help to step but can hold 15 seconds    Standing on One Leg  Tries to lift leg/unable to hold 3 seconds but remains standing independently    Total Score  29          Therex:  SEe pt instructions for details on exercises performed.    PT Education - 02/01/19 1545    Education Details  BERG results, HEP    Person(s) Educated  Patient    Methods  Explanation;Demonstration;Handout    Comprehension  Verbalized understanding;Returned demonstration       PT Short Term Goals - 01/29/19 2148      PT SHORT TERM GOAL #1   Title  Patient will be independent with initial HEP in order to build upon functional gains in therapy. ALL STGS DUE 02/26/19    Time  4    Period  Weeks    Status  New    Target Date  02/26/19      PT SHORT TERM GOAL #2   Title  Patient will increase baseline BERG score by at least 5 points in order to demo decreased fall risk.    Baseline  not yet assessed.    Time  4    Period  Weeks    Status  New      PT SHORT TERM GOAL #3   Title  Patient will decrease 5 times sit <> stand score to at least 24 seconds with single UE support in order to improve functional LE strength.    Baseline  34.34 seconds with BUE    Time  4    Period  Weeks    Status  New  PT SHORT TERM GOAL #4   Title  Patient will  ambulate at least 76' with LRAD and supervision in order to improve endurance for gait.    Time  4    Period  Weeks    Status  New      PT SHORT TERM GOAL #5   Title  Patient will increase gait speed to at least 1.7 ft/sec in order to decrease risk of falls.    Baseline  .39 ft/sec with RW    Time  4    Period  Weeks    Status  New        PT Long Term Goals - 01/29/19 2151      PT LONG TERM GOAL #1   Title  Patient will be independent with final HEP in order to build upon functional gains in therapy. ALL STGS DUE 04/23/19    Time  12    Period  Weeks    Status  New    Target Date  04/23/19      PT LONG TERM GOAL #2   Title  Patient will increase baseline BERG score by at least 10 points in order to demo decreased fall risk.    Time  12    Period  Weeks    Status  New      PT LONG TERM GOAL #3   Title  Patient will ambulate at least 300' outdoors over unlevel surfaces with LRAD and supervision in order to improve community mobility.    Time  12    Period  Weeks    Status  New      PT LONG TERM GOAL #4   Title  Patient will perform TUG in 18 seconds or less with LRAD in order to demo decreased fall risk.    Time  12    Period  Weeks    Status  New      PT LONG TERM GOAL #5   Title  Patient will perform 5x sit <> stand in 18 seconds or less without UE support in order to demo improved LE strength.    Time  12    Period  Weeks    Status  New      Additional Long Term Goals   Additional Long Term Goals  Yes      PT LONG TERM GOAL #6   Title  Patient will improve gait speed to at least 2.6 ft/sec using LRAD in order to demo decreased fall risk and improved community mobility.    Time  12    Period  Weeks    Status  New            Plan - 02/01/19 1546    Clinical Impression Statement  Skilled session focused on BERG balance assessment with score of 29/56, placing pt at very high fall risk.  Educated pt on continued use of RW at home.  Also initiated  supine/hooklying HEP for BLE strengthening.  Pt tolerated well and is making great progress.    Personal Factors and Comorbidities  Comorbidity 2;Past/Current Experience    Comorbidities  OSA, GERD    Examination-Activity Limitations  Locomotion Level;Transfers;Stairs;Stand    Examination-Participation Restrictions  Community Activity    Stability/Clinical Decision Making  Evolving/Moderate complexity    Rehab Potential  Good    PT Frequency  3x / week   followed by 2x week for an additional 8 weeks   PT Duration  4 weeks  followed by 2x week for an additional 8 weeks   PT Treatment/Interventions  ADLs/Self Care Home Management;Electrical Stimulation;Aquatic Therapy;Gait training;DME Instruction;Stair training;Functional mobility training;Therapeutic activities;Therapeutic exercise;Patient/family education;Neuromuscular re-education;Balance training;Passive range of motion    PT Next Visit Plan  Gait with RW, add to HEP as needed, balance, hip flex    PT Home Exercise Plan  [email or web address]    Consulted and Agree with Plan of Care  Patient;Family member/caregiver    Family Member Consulted  pt's husband [name]       Patient will benefit from skilled therapeutic intervention in order to improve the following deficits and impairments:  Abnormal gait, Decreased activity tolerance, Decreased coordination, Decreased balance, Decreased endurance, Decreased strength, Difficulty walking, Decreased mobility  Visit Diagnosis: Difficulty in walking, not elsewhere classified  Muscle weakness (generalized)  Other abnormalities of gait and mobility  Other symptoms and signs involving the nervous system     Problem List Patient Active Problem List   Diagnosis Date Noted  . Guillain Barr syndrome ([place]) 01/19/2019  . Cervical myofascial pain syndrome 12/16/2018  . Carpal tunnel syndrome 12/16/2018  . Axonal GBS (Guillain-Barre syndrome) ([place]) 12/15/2018  . Leg weakness, bilateral 12/08/2018  . IBS  (irritable bowel syndrome) 12/08/2018  . GERD (gastroesophageal reflux disease) 09/14/2018  . 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 Abdominal pain, epigastric 06/03/2017  . Constipation 01/27/2016  . History of adenomatous polyp of colon 01/27/2016   Cameron Sprang, PT, MPT [facility] [address] [place], [place], [address] Phone: [phone]   Fax:  [phone] 02/01/19, 3:49 PM  Name: Paula Sutton MRN: [id number] Date of Birth: Apr 19, 1955

## 2019-02-06 MED ORDER — DULOXETINE HCL 30 MG PO CPEP: 30.0000 mg | ORAL_CAPSULE | Freq: Every day | ORAL | 2 refills | Status: DC

## 2019-02-06 NOTE — Telephone Encounter (Signed)
Gabapentin not working for pain AND it's making her drowsy, groggy- can't tolerate dose.  Will switch to Duloxetine 30 mg nightly x 1 week then 60 mg nightly- for nerve pain- if has nausea, let me know, since only 1% but usually well tolerated.  Let me know if any additional issues with nerve pain

## 2019-02-06 NOTE — Addendum Note (Signed)
Addended by: Courtney Heys on: 02/06/2019 02:27 PM   Modules accepted: Orders

## 2019-02-07 ENCOUNTER — Ambulatory Visit: Payer: 59 | Admitting: Physical Therapy

## 2019-02-12 ENCOUNTER — Ambulatory Visit: Payer: 59 | Admitting: Physical Therapy

## 2019-02-12 ENCOUNTER — Other Ambulatory Visit: Payer: Self-pay

## 2019-02-12 DIAGNOSIS — M6281 Muscle weakness (generalized): Secondary | ICD-10-CM

## 2019-02-12 DIAGNOSIS — R2689 Other abnormalities of gait and mobility: Secondary | ICD-10-CM

## 2019-02-12 DIAGNOSIS — R29818 Other symptoms and signs involving the nervous system: Secondary | ICD-10-CM

## 2019-02-12 DIAGNOSIS — R262 Difficulty in walking, not elsewhere classified: Secondary | ICD-10-CM

## 2019-02-12 NOTE — Therapy (Signed)
[place] [facility] [place] [address] [place], [place], [address] Phone: [phone]   Fax:  [phone]  Physical Therapy Treatment  Patient Details  Name: Paula Sutton MRN: [id number] Date of Birth: [birth year] Referring Provider (PT): Courtney Heys, MD   Encounter Date: 02/12/2019  PT End of Session - 02/12/19 1252    Visit Number  3    Number of Visits  28    Date for PT Re-Evaluation  04/26/19    Authorization Type  [place]    PT Start Time  [phone]   pt arrived late to therapy   PT Stop Time  0848    PT Time Calculation (min)  40 min    Equipment Utilized During Treatment  Gait belt    Activity Tolerance  Patient tolerated treatment well    Behavior During Therapy  [facility] for tasks assessed/performed       Past Medical History:  Diagnosis Date  . Arthritis    knee  . Dyslipidemia   . GERD (gastroesophageal reflux disease)   . Left anterior fascicular block    PATIENT SAYS THIS SHOWED ON EKG DURING COLONOSCOPY , WAS REFERRED TO CARDIOLOGY DR. Minus Breeding  (SEE EPIC ENCOUNTER 09-2017)       Past Surgical History:  Procedure Laterality Date  . ABDOMINAL HYSTERECTOMY    . BIOPSY  08/09/2017   Procedure: BIOPSY;  Surgeon: Danie Binder, MD;  [facility];  Service: Endoscopy;;  gastric biopsy for h pylori  . CARPAL TUNNEL RELEASE Right 12/07/2018  . CATARACT EXTRACTION, BILATERAL     DID 1 YEAR APART , LAST ONE WAS 12-2017  . COLONOSCOPY WITH ESOPHAGOGASTRODUODENOSCOPY (EGD)  2014   [place] PROPOFOL N/A 08/09/2017   3 simple adenomas, diverticulosis in recto-sigmoid, sigmoid, and descending colin, moderate external and internal hemorrhoids  . ESOPHAGOGASTRODUODENOSCOPY (EGD) WITH PROPOFOL N/A 08/09/2017   Low-grade narrowing Schatzki ring due to GERD; small hiatal hernia; mild gastritis and duodenitis due to NSAIDs; biopsy with gastritis  . KNEE ARTHROSCOPY Right  11/2017   WITH DR Wynelle Link  AT [place] Right   . POLYPECTOMY  08/09/2017   Procedure: POLYPECTOMY;  Surgeon: Danie Binder, MD;  [facility];  Service: Endoscopy;;  cecal polyp hs, transverse colon polyp hs, rectal polyp cs  . TOTAL KNEE REVISION Right 03/01/2018   Procedure: RIGHT TOTAL KNEE REVISION;  Surgeon: Gaynelle Arabian, MD;  [facility];  Service: Orthopedics;  Laterality: Right;  129min    There were no vitals filed for this visit.  Subjective Assessment - 02/12/19 0810    Subjective  Stopped taking the gabapentin - it used to make her fall. Now taking Cymbalta. Exercises are going well at home. Had 2 falls at home - one was at the countertop when she was doing her exercises that she was previosuly doing with HHPT(had her w/c behind her but did 30 reps before resting). Back pain has gotten better since walking with her RW.    Pertinent History  GBS (diagnosed 9/20) history of OSA, GERD, recent CTR right hand    How long can you walk comfortably?  10-15 minutes or 260'    Diagnostic tests  MRI - had imaging done on brain last week, waiting for results -Had  a nerve conduction test last week: 50% on L, 25% on R    Patient Stated Goals  wants to walk  completely without a RW    Currently in Pain?  No/denies    Pain Onset  More than a month ago                       [facility] Adult PT Treatment/Exercise - 02/12/19 1255      Transfers   Transfers  Stand Pivot Transfers;Sit to Stand;Stand to Sit    Sit to Stand  4: Min guard;5: Supervision    Sit to Stand Details  Verbal cues for sequencing;Verbal cues for technique;Visual cues/gestures for precautions/safety;Visual cues/gestures for sequencing    Sit to Stand Details (indicate cue type and reason)  With therapist in front of pt and pt's hands on therapist arms, pt practicing forward weight shift and getting weight onto BLEs to unweight glutes, pt initially fearful of movement x5 reps.  Massed practice sit <> stand from higher mat table and using BUE to push off to come to stand with cueing for forward weight shifting before standing - took multiple reps before pt performed correctly. Pt with tendency to ADD BLEs when coming to stand and increased when coming back down to sit.     Stand to Sit  5: Supervision    Stand to Sit Details  cues for eccentric control and using BLE to initiate sitting for eccentric control - during first couple of reps pt initiates movement by performing increased trunk flexion    Stand Pivot Transfers  5: Supervision   from w/c to mat table with RW     Ambulation/Gait   Ambulation/Gait  Yes    Ambulation/Gait Assistance  4: Min guard;5: Supervision    Ambulation/Gait Assistance Details  Cues for heel > toe pattern B. Pt reporting noted improvement after performing B hip flexor stretch.    Ambulation Distance (Feet)  115 Feet    Assistive device  Rolling walker    Gait Pattern  Step-through pattern;Decreased weight shift to right;Trunk flexed;Poor foot clearance - right;Decreased hip/knee flexion - right;Decreased hip/knee flexion - left;Decreased dorsiflexion - right;Decreased dorsiflexion - left;Lateral trunk lean to right    Ambulation Surface  Level;Indoor      Exercises   Exercises  Other Exercises    Other Exercises   Educated pt to use RW at all times when at home due to high fall risk from [facility] balance assessment. Pt had a fall when performing exercises from HHPT at countertop and did not rest between sets. Educated pt to only perform exercises that have been prescribed by pt's OP PT in order to improve safety at home and decrease risk of any future falls.       Knee/Hip Exercises: Stretches   Hip Flexor Stretch  60 seconds;Both;2 reps    Hip Flexor Stretch Limitations  modified thomas test position with foot on step.          Balance Exercises - 02/12/19 1307      Balance Exercises: Standing   Standing Eyes Closed  Wide (BOA);Solid  surface;3 reps;10 secs   standing at edge of mat with RW       PT Education - 02/12/19 1252    Education Details  hip flexor stretch for HEP, sit <> stand technique, continued use of RW at home, performing HEP that therapist has provided for pt    Person(s) Educated  Patient    Methods  Explanation;Demonstration;Handout    Comprehension  Verbalized understanding;Returned demonstration       PT Short Term Goals - 01/29/19  2148      PT SHORT TERM GOAL #1   Title  Patient will be independent with initial HEP in order to build upon functional gains in therapy. ALL STGS DUE 02/26/19    Time  4    Period  Weeks    Status  New    Target Date  02/26/19      PT SHORT TERM GOAL #2   Title  Patient will increase baseline BERG score by at least 5 points in order to demo decreased fall risk.    Baseline  not yet assessed.    Time  4    Period  Weeks    Status  New      PT SHORT TERM GOAL #3   Title  Patient will decrease 5 times sit <> stand score to at least 24 seconds with single UE support in order to improve functional LE strength.    Baseline  34.34 seconds with BUE    Time  4    Period  Weeks    Status  New      PT SHORT TERM GOAL #4   Title  Patient will ambulate at least 27' with LRAD and supervision in order to improve endurance for gait.    Time  4    Period  Weeks    Status  New      PT SHORT TERM GOAL #5   Title  Patient will increase gait speed to at least 1.7 ft/sec in order to decrease risk of falls.    Baseline  .33 ft/sec with RW    Time  4    Period  Weeks    Status  New        PT Long Term Goals - 01/29/19 2151      PT LONG TERM GOAL #1   Title  Patient will be independent with final HEP in order to build upon functional gains in therapy. ALL STGS DUE 04/23/19    Time  12    Period  Weeks    Status  New    Target Date  04/23/19      PT LONG TERM GOAL #2   Title  Patient will increase baseline BERG score by at least 10 points in order to demo  decreased fall risk.    Time  12    Period  Weeks    Status  New      PT LONG TERM GOAL #3   Title  Patient will ambulate at least 300' outdoors over unlevel surfaces with LRAD and supervision in order to improve community mobility.    Time  12    Period  Weeks    Status  New      PT LONG TERM GOAL #4   Title  Patient will perform TUG in 18 seconds or less with LRAD in order to demo decreased fall risk.    Time  12    Period  Weeks    Status  New      PT LONG TERM GOAL #5   Title  Patient will perform 5x sit <> stand in 18 seconds or less without UE support in order to demo improved LE strength.    Time  12    Period  Weeks    Status  New      Additional Long Term Goals   Additional Long Term Goals  Yes      PT LONG TERM GOAL #6  Title  Patient will improve gait speed to at least 2.6 ft/sec using LRAD in order to demo decreased fall risk and improved community mobility.    Time  12    Period  Weeks    Status  New            Plan - 02/12/19 1304    Clinical Impression Statement  Skilled session focused on hip flexion ROM, sit <> stand training, and gait training. Pt with difficulty initially performing sit <> stand and performing forward weight shift, took multiple reps to perform correctly. Needed cues for correct sequencing to perform stand > stand without initiating movement using lumbar flexion. Pt reported relief after B hip flexor stretch in supine. Pt tolerated well and is very motivated. Will continue to progress towards LTGs.    Personal Factors and Comorbidities  Comorbidity 2;Past/Current Experience    Comorbidities  OSA, GERD    Examination-Activity Limitations  Locomotion Level;Transfers;Stairs;Stand    Examination-Participation Restrictions  Community Activity    Stability/Clinical Decision Making  Evolving/Moderate complexity    Rehab Potential  Good    PT Frequency  3x / week   followed by 2x week for an additional 8 weeks   PT Duration  4 weeks    followed by 2x week for an additional 8 weeks   PT Treatment/Interventions  ADLs/Self Care Home Management;Electrical Stimulation;Aquatic Therapy;Gait training;DME Instruction;Stair training;Functional mobility training;Therapeutic activities;Therapeutic exercise;Patient/family education;Neuromuscular re-education;Balance training;Passive range of motion    PT Next Visit Plan  gait with RW, continue sit <> stand training, standing balance, go over counter exercises pt has been performing previously with HHPT and assess safety while performing. standing balance.    PT Home Exercise Plan  [id number]    Consulted and Agree with Plan of Care  Patient;Family member/caregiver    Family Member Consulted  pt's husband [name]       Patient will benefit from skilled therapeutic intervention in order to improve the following deficits and impairments:  Abnormal gait, Decreased activity tolerance, Decreased coordination, Decreased balance, Decreased endurance, Decreased strength, Difficulty walking, Decreased mobility  Visit Diagnosis: Muscle weakness (generalized)  Difficulty in walking, not elsewhere classified  Other abnormalities of gait and mobility  Other symptoms and signs involving the nervous system     Problem List Patient Active Problem List   Diagnosis Date Noted  . Guillain Barr syndrome ([place]) 01/19/2019  . Cervical myofascial pain syndrome 12/16/2018  . Carpal tunnel syndrome 12/16/2018  . 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 Leg weakness, bilateral 12/08/2018  . IBS (irritable bowel syndrome) 12/08/2018  . GERD (gastroesophageal reflux disease) 09/14/2018  . 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 Abdominal pain, epigastric 06/03/2017  . Constipation 01/27/2016  . History of adenomatous polyp of colon 01/27/2016    Arliss Journey, PT, DPT  02/12/2019, 1:07 PM  [place] [address] [place], [place], [address] Phone: [phone]   Fax:  [phone]  Name: Paula Sutton MRN: [id number] Date of Birth: 1955/06/07

## 2019-02-15 ENCOUNTER — Other Ambulatory Visit: Payer: Self-pay

## 2019-02-15 ENCOUNTER — Ambulatory Visit: Payer: 59 | Attending: Physical Medicine and Rehabilitation | Admitting: Physical Therapy

## 2019-02-15 DIAGNOSIS — R262 Difficulty in walking, not elsewhere classified: Secondary | ICD-10-CM | POA: Insufficient documentation

## 2019-02-15 DIAGNOSIS — R29818 Other symptoms and signs involving the nervous system: Secondary | ICD-10-CM | POA: Insufficient documentation

## 2019-02-15 DIAGNOSIS — R2689 Other abnormalities of gait and mobility: Secondary | ICD-10-CM

## 2019-02-15 DIAGNOSIS — M6281 Muscle weakness (generalized): Secondary | ICD-10-CM | POA: Insufficient documentation

## 2019-02-15 NOTE — Therapy (Signed)
[place] [address] [place], [place], [address] Phone: [phone]   Fax:  [phone]  Physical Therapy Treatment  Patient Details  Name: Paula Sutton MRN: [id number] Date of Birth: 13-Nov-1955 Referring Provider (PT): Courtney Heys, MD   Encounter Date: 02/15/2019  PT End of Session - 02/15/19 1245    Visit Number  4    Number of Visits  28    Date for PT Re-Evaluation  04/26/19    Authorization Type  [employer]    PT Start Time  [phone]    PT Stop Time  0934    PT Time Calculation (min)  44 min    Equipment Utilized During Treatment  Gait belt    Activity Tolerance  Patient tolerated treatment well    Behavior During Therapy  [facility] for tasks assessed/performed       Past Medical History:  Diagnosis Date  . Arthritis    knee  . Dyslipidemia   . GERD (gastroesophageal reflux disease)   . Left anterior fascicular block    PATIENT SAYS THIS SHOWED ON EKG DURING COLONOSCOPY , WAS REFERRED TO CARDIOLOGY DR. Minus Breeding  (SEE EPIC ENCOUNTER 09-2017)       Past Surgical History:  Procedure Laterality Date  . ABDOMINAL HYSTERECTOMY    . BIOPSY  08/09/2017   Procedure: BIOPSY;  Surgeon: Danie Binder, MD;  [facility];  Service: Endoscopy;;  gastric biopsy for h pylori  . CARPAL TUNNEL RELEASE Right 12/07/2018  . CATARACT EXTRACTION, BILATERAL     DID 1 YEAR APART , LAST ONE WAS 12-2017  . COLONOSCOPY WITH ESOPHAGOGASTRODUODENOSCOPY (EGD)  2014   [place] PROPOFOL N/A 08/09/2017   3 simple adenomas, diverticulosis in recto-sigmoid, sigmoid, and descending colin, moderate external and internal hemorrhoids  . ESOPHAGOGASTRODUODENOSCOPY (EGD) WITH PROPOFOL N/A 08/09/2017   Low-grade narrowing Schatzki ring due to GERD; small hiatal hernia; mild gastritis and duodenitis due to NSAIDs; biopsy with gastritis  . KNEE ARTHROSCOPY Right 11/2017   WITH DR Wynelle Link  AT  [place] Right   . POLYPECTOMY  08/09/2017   Procedure: POLYPECTOMY;  Surgeon: Danie Binder, MD;  [facility];  Service: Endoscopy;;  cecal polyp hs, transverse colon polyp hs, rectal polyp cs  . TOTAL KNEE REVISION Right 03/01/2018   Procedure: RIGHT TOTAL KNEE REVISION;  Surgeon: Gaynelle Arabian, MD;  [facility];  Service: Orthopedics;  Laterality: Right;  171min    There were no vitals filed for this visit.                    [facility] Adult PT Treatment/Exercise - 02/15/19 0001      Transfers   Comments  Sit <> stands at countertop from w/c, initial cues for anterior weight shift, couple reps put ball in between thighs to prevent hip ADD, and then progressed to cues to keep knees apart with good carryover and decreased hip ADD throughout rest of session - 2 x 10 reps. Mini squats with BUE support at sink 2 x 10 reps, cues for technique and to perform glute squeeze at top for 3 seconds before lowering. Intermittent seated rest breaks.      Neuro Re-ed    Neuro Re-ed Details   Standing at countertop: narrow BOS eyes open static standing progressing to head nods, performed same exercise with eyes closed feet together 3 x 30  seconds, with added head turns and head nods 2 x 10 reps each. Modified tandem with feet halfway apart B, 2 x 15 seconds eyes closed. Min guard for safety, no UE support. With BUE support 1 x 10 reps lateral step outs B with cues to lead with outside of heel.             PT Education - 02/15/19 1244    Education Details  new additions to HEP - mini squats and sit <> stands at [employer]) Educated  Patient    Methods  Explanation;Demonstration;Handout    Comprehension  Verbalized understanding;Returned demonstration       PT Short Term Goals - 01/29/19 2148      PT SHORT TERM GOAL #1   Title  Patient will be independent with initial HEP in order to build upon functional gains in therapy. ALL  STGS DUE 02/26/19    Time  4    Period  Weeks    Status  New    Target Date  02/26/19      PT SHORT TERM GOAL #2   Title  Patient will increase baseline BERG score by at least 5 points in order to demo decreased fall risk.    Baseline  not yet assessed.    Time  4    Period  Weeks    Status  New      PT SHORT TERM GOAL #3   Title  Patient will decrease 5 times sit <> stand score to at least 24 seconds with single UE support in order to improve functional LE strength.    Baseline  34.34 seconds with BUE    Time  4    Period  Weeks    Status  New      PT SHORT TERM GOAL #4   Title  Patient will ambulate at least 56' with LRAD and supervision in order to improve endurance for gait.    Time  4    Period  Weeks    Status  New      PT SHORT TERM GOAL #5   Title  Patient will increase gait speed to at least 1.7 ft/sec in order to decrease risk of falls.    Baseline  .59 ft/sec with RW    Time  4    Period  Weeks    Status  New        PT Long Term Goals - 01/29/19 2151      PT LONG TERM GOAL #1   Title  Patient will be independent with final HEP in order to build upon functional gains in therapy. ALL STGS DUE 04/23/19    Time  12    Period  Weeks    Status  New    Target Date  04/23/19      PT LONG TERM GOAL #2   Title  Patient will increase baseline BERG score by at least 10 points in order to demo decreased fall risk.    Time  12    Period  Weeks    Status  New      PT LONG TERM GOAL #3   Title  Patient will ambulate at least 300' outdoors over unlevel surfaces with LRAD and supervision in order to improve community mobility.    Time  12    Period  Weeks    Status  New      PT LONG TERM GOAL #4  Title  Patient will perform TUG in 18 seconds or less with LRAD in order to demo decreased fall risk.    Time  12    Period  Weeks    Status  New      PT LONG TERM GOAL #5   Title  Patient will perform 5x sit <> stand in 18 seconds or less without UE support in order  to demo improved LE strength.    Time  12    Period  Weeks    Status  New      Additional Long Term Goals   Additional Long Term Goals  Yes      PT LONG TERM GOAL #6   Title  Patient will improve gait speed to at least 2.6 ft/sec using LRAD in order to demo decreased fall risk and improved community mobility.    Time  12    Period  Weeks    Status  New            Plan - 02/15/19 1248    Clinical Impression Statement  Today's skilled session focused on LE strengthening at countertop with additions to HEP and standing balance with eyes open/closed with narrow BOS. Pt tolerated well, needed intermittent rest breaks due to fatigue. Pt with improved sit <> stand technique, able to demonstrate decreased ADD after verbal and demonstrative cues. Will continue to progress towards LTGs.    Personal Factors and Comorbidities  Comorbidity 2;Past/Current Experience    Comorbidities  OSA, GERD    Examination-Activity Limitations  Locomotion Level;Transfers;Stairs;Stand    Examination-Participation Restrictions  Community Activity    Stability/Clinical Decision Making  Evolving/Moderate complexity    Rehab Potential  Good    PT Frequency  3x / week   followed by 2x week for an additional 8 weeks   PT Duration  4 weeks   followed by 2x week for an additional 8 weeks   PT Treatment/Interventions  ADLs/Self Care Home Management;Electrical Stimulation;Aquatic Therapy;Gait training;DME Instruction;Stair training;Functional mobility training;Therapeutic activities;Therapeutic exercise;Patient/family education;Neuromuscular re-education;Balance training;Passive range of motion    PT Next Visit Plan  gait with RW, continue sit <> stand training, standing balance, go over counter exercises pt has been performing previously with HHPT and assess safety while performing. standing balance.    PT Home Exercise Plan  [id number]    Consulted and Agree with Plan of Care  Patient;Family member/caregiver    Family  Member Consulted  pt's husband [name]       Patient will benefit from skilled therapeutic intervention in order to improve the following deficits and impairments:  Abnormal gait, Decreased activity tolerance, Decreased coordination, Decreased balance, Decreased endurance, Decreased strength, Difficulty walking, Decreased mobility  Visit Diagnosis: Muscle weakness (generalized)  Difficulty in walking, not elsewhere classified  Other abnormalities of gait and mobility  Other symptoms and signs involving the nervous system     Problem List Patient Active Problem List   Diagnosis Date Noted  . Guillain Barr syndrome ([place]) 01/19/2019  . Cervical myofascial pain syndrome 12/16/2018  . Carpal tunnel syndrome 12/16/2018  . 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 Leg weakness, bilateral 12/08/2018  . IBS (irritable bowel syndrome) 12/08/2018  . GERD (gastroesophageal reflux disease) 09/14/2018  . 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 Abdominal pain, epigastric 06/03/2017  . Constipation 01/27/2016  . History of adenomatous polyp of colon 01/27/2016    Arliss Journey, PT, DPT  02/15/2019, 12:54 PM  [facility] [address] [place], [place], [address] Phone: [phone]   Fax:  [phone]  Name: Paula Sutton MRN: [id number] Date of Birth: 05-18-1955

## 2019-02-16 ENCOUNTER — Encounter: Payer: Self-pay | Admitting: Physical Therapy

## 2019-02-16 ENCOUNTER — Ambulatory Visit: Payer: 59 | Admitting: Physical Therapy

## 2019-02-16 DIAGNOSIS — R262 Difficulty in walking, not elsewhere classified: Secondary | ICD-10-CM

## 2019-02-16 DIAGNOSIS — M6281 Muscle weakness (generalized): Secondary | ICD-10-CM

## 2019-02-16 DIAGNOSIS — R2689 Other abnormalities of gait and mobility: Secondary | ICD-10-CM

## 2019-02-16 NOTE — Therapy (Signed)
[facility] [address] [place], [place], [address] Phone: [phone]   Fax:  [phone]  Physical Therapy Treatment  Patient Details  Name: Paula Sutton MRN: [id number] Date of Birth: [birth year] Referring Provider (PT): Courtney Heys, MD   Encounter Date: 02/16/2019  PT End of Session - 02/16/19 1347    Visit Number  5    Number of Visits  28    Date for PT Re-Evaluation  04/26/19    Authorization Type  [employer]    PT Start Time  1105    PT Stop Time  1148    PT Time Calculation (min)  43 min    Equipment Utilized During Treatment  Gait belt    Activity Tolerance  Patient tolerated treatment well    Behavior During Therapy  WFL for tasks assessed/performed       Past Medical History:  Diagnosis Date  . Arthritis    knee  . Dyslipidemia   . GERD (gastroesophageal reflux disease)   . Left anterior fascicular block    PATIENT SAYS THIS SHOWED ON EKG DURING COLONOSCOPY , WAS REFERRED TO CARDIOLOGY DR. Minus Breeding  (SEE EPIC ENCOUNTER 09-2017)       Past Surgical History:  Procedure Laterality Date  . ABDOMINAL HYSTERECTOMY    . BIOPSY  08/09/2017   Procedure: BIOPSY;  Surgeon: Danie Binder, MD;  [facility];  Service: Endoscopy;;  gastric biopsy for h pylori  . CARPAL TUNNEL RELEASE Right 12/07/2018  . CATARACT EXTRACTION, BILATERAL     DID 1 YEAR APART , LAST ONE WAS 12-2017  . COLONOSCOPY WITH ESOPHAGOGASTRODUODENOSCOPY (EGD)  2014   [place] PROPOFOL N/A 08/09/2017   3 simple adenomas, diverticulosis in recto-sigmoid, sigmoid, and descending colin, moderate external and internal hemorrhoids  . ESOPHAGOGASTRODUODENOSCOPY (EGD) WITH PROPOFOL N/A 08/09/2017   Low-grade narrowing Schatzki ring due to GERD; small hiatal hernia; mild gastritis and duodenitis due to NSAIDs; biopsy with gastritis  . KNEE ARTHROSCOPY Right 11/2017   WITH DR Wynelle Link  AT  [place] Right   . POLYPECTOMY  08/09/2017   Procedure: POLYPECTOMY;  Surgeon: Danie Binder, MD;  [facility];  Service: Endoscopy;;  cecal polyp hs, transverse colon polyp hs, rectal polyp cs  . TOTAL KNEE REVISION Right 03/01/2018   Procedure: RIGHT TOTAL KNEE REVISION;  Surgeon: Gaynelle Arabian, MD;  Location: [facility];  Service: Orthopedics;  Laterality: Right;  173min    There were no vitals filed for this visit.  Subjective Assessment - 02/16/19 1107    Subjective  Felt tired yesterday from leg strengthening exercises. Tried the hip flexor stretch at home yesterday and it went very well.    Pertinent History  GBS (diagnosed 9/20) history of OSA, GERD, recent CTR right hand    How long can you walk comfortably?  10-15 minutes or 260'    Diagnostic tests  MRI - had imaging done on brain last week, waiting for results -Had  a nerve conduction test last week: 50% on L, 25% on R    Patient Stated Goals  wants to walk completely without a RW    Currently in Pain?  No/denies    Pain Onset  More than a month ago         02/18/19 2048  Ambulation/Gait  Ambulation/Gait Yes  Ambulation/Gait Assistance 5: Supervision  Ambulation/Gait Assistance Details Cues for upright  posture   Ambulation Distance (Feet) 350 Feet  Assistive device Rolling walker  Gait Pattern Step-through pattern;Decreased weight shift to right;Trunk flexed;Poor foot clearance - right;Decreased hip/knee flexion - right;Decreased hip/knee flexion - left;Decreased dorsiflexion - right;Decreased dorsiflexion - left;Lateral trunk lean to right  Ambulation Surface Indoor;Level  Neuro Re-ed   Neuro Re-ed Details  Dynamic sitting balance at edge of mat table with LEs floating above the ground and pt sitting on green foam circle, reciprocal UE lifts 1 x 10 reps, multi-directional reaching toward cone  with both R and L UE also including cross body reaching, increased difficulty when weight shifting  to R, massed practice. Min guard for balance. With 2" block under pt's LLE for increased weight bearing towards R, 3 x 7 reps sit <> stands from elevated mat surface with cues for posture and performing glute set before eccentrically sitting back down to mat. Cues for technique   Exercises  Other Exercises  Sitting at edge of mat table: propping down to elbow and coming back up to midline for core and oblique strengthening for bed mobility and transfers 2 x 5 reps B.  R sidelying: therapist assisted AAROM for L hip flexion 2 x 10 reps, with 4 x 30 second hold for stretch of hip flexors. 2 x 5 reps supine bridging, cues for inhale/exhale and pursed lip breathing while performing as well as performing glute set first.                             PT Short Term Goals - 01/29/19 2148      PT SHORT TERM GOAL #1   Title  Patient will be independent with initial HEP in order to build upon functional gains in therapy. ALL STGS DUE 02/26/19    Time  4    Period  Weeks    Status  New    Target Date  02/26/19      PT SHORT TERM GOAL #2   Title  Patient will increase baseline BERG score by at least 5 points in order to demo decreased fall risk.    Baseline  not yet assessed.    Time  4    Period  Weeks    Status  New      PT SHORT TERM GOAL #3   Title  Patient will decrease 5 times sit <> stand score to at least 24 seconds with single UE support in order to improve functional LE strength.    Baseline  34.34 seconds with BUE    Time  4    Period  Weeks    Status  New      PT SHORT TERM GOAL #4   Title  Patient will ambulate at least 27' with LRAD and supervision in order to improve endurance for gait.    Time  4    Period  Weeks    Status  New      PT SHORT TERM GOAL #5   Title  Patient will increase gait speed to at least 1.7 ft/sec in order to decrease risk of falls.    Baseline  .53 ft/sec with RW    Time  4    Period  Weeks    Status  New        PT Long  Term Goals - 01/29/19 2151      PT LONG TERM GOAL #1   Title  Patient will be independent  with final HEP in order to build upon functional gains in therapy. ALL STGS DUE 04/23/19    Time  12    Period  Weeks    Status  New    Target Date  04/23/19      PT LONG TERM GOAL #2   Title  Patient will increase baseline BERG score by at least 10 points in order to demo decreased fall risk.    Time  12    Period  Weeks    Status  New      PT LONG TERM GOAL #3   Title  Patient will ambulate at least 300' outdoors over unlevel surfaces with LRAD and supervision in order to improve community mobility.    Time  12    Period  Weeks    Status  New      PT LONG TERM GOAL #4   Title  Patient will perform TUG in 18 seconds or less with LRAD in order to demo decreased fall risk.    Time  12    Period  Weeks    Status  New      PT LONG TERM GOAL #5   Title  Patient will perform 5x sit <> stand in 18 seconds or less without UE support in order to demo improved LE strength.    Time  12    Period  Weeks    Status  New      Additional Long Term Goals   Additional Long Term Goals  Yes      PT LONG TERM GOAL #6   Title  Patient will improve gait speed to at least 2.6 ft/sec using LRAD in order to demo decreased fall risk and improved community mobility.    Time  12    Period  Weeks    Status  New            02/18/19 2054  Plan  Clinical Impression Statement Focus of today's skilled session was gait training with RW, LE strengthening (R>L) and dynamic sitting balance with focus on core/trunk activation. Pt with increased dificulty performing dynamic weight shiting in sitting towards R while on a compliant surface, min guard for safety. Pt continues to demo a lack of eccentric control during sit <> stands and needs to perform transfer from higher mat surface. Pt is progressing well - will continue to progress towards LTGs.  Personal Factors and Comorbidities Comorbidity 2;Past/Current Experience   Comorbidities OSA, GERD  Examination-Activity Limitations Locomotion Level;Transfers;Stairs;Stand  Examination-Participation Restrictions Community Activity  Pt will benefit from skilled therapeutic intervention in order to improve on the following deficits Abnormal gait;Decreased activity tolerance;Decreased coordination;Decreased balance;Decreased endurance;Decreased strength;Difficulty walking;Decreased mobility  Stability/Clinical Decision Making Evolving/Moderate complexity  Rehab Potential Good  PT Frequency 3x / week (followed by 2x week for an additional 8 weeks)  PT Duration 4 weeks (followed by 2x week for an additional 8 weeks)  PT Treatment/Interventions ADLs/Self Care Home Management;Electrical Stimulation;Aquatic Therapy;Gait training;DME Instruction;Stair training;Functional mobility training;Therapeutic activities;Therapeutic exercise;Patient/family education;Neuromuscular re-education;Balance training;Passive range of motion  PT Next Visit Plan NuStep/SciFit for LE strengthening and endurance. gait with RW, continue sit <> stand training, standing balance, go over counter exercises pt has been performing previously with HHPT and assess safety while performing. standing balance.  PT Home Exercise Plan [id number]  Consulted and Agree with Plan of Care Patient;Family member/caregiver  Family Member Consulted pt's husband [name]       Patient will benefit from skilled therapeutic intervention in  order to improve the following deficits and impairments:     Visit Diagnosis: Muscle weakness (generalized)  Difficulty in walking, not elsewhere classified  Other abnormalities of gait and mobility     Problem List Patient Active Problem List   Diagnosis Date Noted  . Guillain Barr syndrome ([place]) 01/19/2019  . Cervical myofascial pain syndrome 12/16/2018  . Carpal tunnel syndrome 12/16/2018  . 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 Leg weakness,  bilateral 12/08/2018  . IBS (irritable bowel syndrome) 12/08/2018  . GERD (gastroesophageal reflux disease) 09/14/2018  . 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 Abdominal pain, epigastric 06/03/2017  . Constipation 01/27/2016  . History of adenomatous polyp of colon 01/27/2016    Arliss Journey, PT, DPT  02/16/2019, 1:49 PM  [facility] [address] [place], [place], [address] Phone: [phone]   Fax:  [phone]  Name: Paula Sutton MRN: [id number] Date of Birth: 1956-01-27

## 2019-02-21 ENCOUNTER — Other Ambulatory Visit: Payer: Self-pay

## 2019-02-21 ENCOUNTER — Encounter: Payer: Self-pay | Admitting: Physical Therapy

## 2019-02-21 ENCOUNTER — Ambulatory Visit: Payer: 59 | Admitting: Physical Therapy

## 2019-02-21 DIAGNOSIS — R2689 Other abnormalities of gait and mobility: Secondary | ICD-10-CM

## 2019-02-21 DIAGNOSIS — M6281 Muscle weakness (generalized): Secondary | ICD-10-CM

## 2019-02-21 DIAGNOSIS — R262 Difficulty in walking, not elsewhere classified: Secondary | ICD-10-CM

## 2019-02-21 NOTE — Therapy (Signed)
[facility] [address] [place], [place], [address] Phone: [phone]   Fax:  [phone]  Physical Therapy Treatment  Patient Details  Name: Paula Sutton MRN: [id number] Date of Birth: [birth year] Referring Provider (PT): Courtney Heys, MD   Encounter Date: 02/21/2019  PT End of Session - 02/21/19 1202    Visit Number  6    Number of Visits  28    Date for PT Re-Evaluation  04/26/19    Authorization Type  [employer]    PT Start Time  1100    PT Stop Time  1142    PT Time Calculation (min)  42 min    Equipment Utilized During Treatment  Gait belt    Activity Tolerance  Patient tolerated treatment well    Behavior During Therapy  WFL for tasks assessed/performed       Past Medical History:  Diagnosis Date  . Arthritis    knee  . Dyslipidemia   . GERD (gastroesophageal reflux disease)   . Left anterior fascicular block    PATIENT SAYS THIS SHOWED ON EKG DURING COLONOSCOPY , WAS REFERRED TO CARDIOLOGY DR. Minus Breeding  (SEE EPIC ENCOUNTER 09-2017)       Past Surgical History:  Procedure Laterality Date  . ABDOMINAL HYSTERECTOMY    . BIOPSY  08/09/2017   Procedure: BIOPSY;  Surgeon: Danie Binder, MD;  [facility];  Service: Endoscopy;;  gastric biopsy for h pylori  . CARPAL TUNNEL RELEASE Right 12/07/2018  . CATARACT EXTRACTION, BILATERAL     DID 1 YEAR APART , LAST ONE WAS 12-2017  . COLONOSCOPY WITH ESOPHAGOGASTRODUODENOSCOPY (EGD)  2014   [place] PROPOFOL N/A 08/09/2017   3 simple adenomas, diverticulosis in recto-sigmoid, sigmoid, and descending colin, moderate external and internal hemorrhoids  . ESOPHAGOGASTRODUODENOSCOPY (EGD) WITH PROPOFOL N/A 08/09/2017   Low-grade narrowing Schatzki ring due to GERD; small hiatal hernia; mild gastritis and duodenitis due to NSAIDs; biopsy with gastritis  . KNEE ARTHROSCOPY Right 11/2017   WITH DR Wynelle Link  AT  [place] Right   . POLYPECTOMY  08/09/2017   Procedure: POLYPECTOMY;  Surgeon: Danie Binder, MD;  [facility];  Service: Endoscopy;;  cecal polyp hs, transverse colon polyp hs, rectal polyp cs  . TOTAL KNEE REVISION Right 03/01/2018   Procedure: RIGHT TOTAL KNEE REVISION;  Surgeon: Gaynelle Arabian, MD;  Location[facility];  Service: Orthopedics;  Laterality: Right;  133min    There were no vitals filed for this visit.  Subjective Assessment - 02/21/19 1101    Subjective  Has been doing a lot more walking with the walker. Front of her hip flexors are feeling a lot less sore. Exercises are going well. No falls.    Pertinent History  GBS (diagnosed 9/20) history of OSA, GERD, recent CTR right hand    How long can you walk comfortably?  10-15 minutes or 260'    Diagnostic tests  MRI - had imaging done on brain last week, waiting for results -Had  a nerve conduction test last week: 50% on L, 25% on R    Patient Stated Goals  wants to walk completely without a RW    Currently in Pain?  No/denies    Pain Onset  More than a month ago  [facility] PT Treatment/Exercise - 02/21/19 1208      Ambulation/Gait   Ambulation/Gait  Yes    Ambulation/Gait Assistance  5: Supervision    Ambulation/Gait Assistance Details  1 lap prior to NuStep, 1 lap after NuStep    Ambulation Distance (Feet)  230 Feet    Assistive device  Rolling walker    Gait Pattern  Step-through pattern;Decreased weight shift to right;Trunk flexed;Poor foot clearance - right;Decreased hip/knee flexion - right;Decreased hip/knee flexion - left;Decreased dorsiflexion - right;Decreased dorsiflexion - left;Lateral trunk lean to right    Ambulation Surface  Indoor;Level      Neuro Re-ed    Neuro Re-ed Details   At edge of mat table and with RW for safety, min guard for balance. On level floor: feet together eyes closed 2 x 30 seconds, feet together 1 x 10 head turns,  1 x 10 head nods. On 1" foam on floor, wider BOS eyes closed progressing to more narrow BOS, 3 x 20-30 second reps. With feet together 3 x 5 reps head nods and head turns. Min guard/min A for balance.       Exercises   Other Exercises   NuStep BLE and BUE for strengthening, endurance, ROM, and activity tolerance level 4 for 7 minutes. At edge of mat table: forward step ups onto long black step with BUE on RW, min guard for support/ balance. 2 x 10 reps B, pt with increased difficulty performing on RLE.                PT Short Term Goals - 01/29/19 2148      PT SHORT TERM GOAL #1   Title  Patient will be independent with initial HEP in order to build upon functional gains in therapy. ALL STGS DUE 02/26/19    Time  4    Period  Weeks    Status  New    Target Date  02/26/19      PT SHORT TERM GOAL #2   Title  Patient will increase baseline BERG score by at least 5 points in order to demo decreased fall risk.    Baseline  not yet assessed.    Time  4    Period  Weeks    Status  New      PT SHORT TERM GOAL #3   Title  Patient will decrease 5 times sit <> stand score to at least 24 seconds with single UE support in order to improve functional LE strength.    Baseline  34.34 seconds with BUE    Time  4    Period  Weeks    Status  New      PT SHORT TERM GOAL #4   Title  Patient will ambulate at least 61' with LRAD and supervision in order to improve endurance for gait.    Time  4    Period  Weeks    Status  New      PT SHORT TERM GOAL #5   Title  Patient will increase gait speed to at least 1.7 ft/sec in order to decrease risk of falls.    Baseline  .37 ft/sec with RW    Time  4    Period  Weeks    Status  New        PT Long Term Goals - 01/29/19 2151      PT LONG TERM GOAL #1   Title  Patient will be independent with final HEP in  order to build upon functional gains in therapy. ALL STGS DUE 04/23/19    Time  12    Period  Weeks    Status  New    Target Date   04/23/19      PT LONG TERM GOAL #2   Title  Patient will increase baseline BERG score by at least 10 points in order to demo decreased fall risk.    Time  12    Period  Weeks    Status  New      PT LONG TERM GOAL #3   Title  Patient will ambulate at least 300' outdoors over unlevel surfaces with LRAD and supervision in order to improve community mobility.    Time  12    Period  Weeks    Status  New      PT LONG TERM GOAL #4   Title  Patient will perform TUG in 18 seconds or less with LRAD in order to demo decreased fall risk.    Time  12    Period  Weeks    Status  New      PT LONG TERM GOAL #5   Title  Patient will perform 5x sit <> stand in 18 seconds or less without UE support in order to demo improved LE strength.    Time  12    Period  Weeks    Status  New      Additional Long Term Goals   Additional Long Term Goals  Yes      PT LONG TERM GOAL #6   Title  Patient will improve gait speed to at least 2.6 ft/sec using LRAD in order to demo decreased fall risk and improved community mobility.    Time  12    Period  Weeks    Status  New            Plan - 02/21/19 1204    Clinical Impression Statement  Focus of today's skilled session was LE strengthening, activity tolerance, and standing balance with eyes closed and level compliant surfaces. Pt tolerated session well - fatigueing with RLE during 2nd rep of 10 forward step ups. Need min guard/min A for standing balance at end of mat, especially on compliant surfaces with head turns. Intermittent seated rest breaks throughout due to pt fatigueing after prolonged standing activity. Pt is motivated and progressing well , will continue to progress towards LTGs.    Personal Factors and Comorbidities  Comorbidity 2;Past/Current Experience    Comorbidities  OSA, GERD    Examination-Activity Limitations  Locomotion Level;Transfers;Stairs;Stand    Examination-Participation Restrictions  Community Activity    Stability/Clinical  Decision Making  Evolving/Moderate complexity    Rehab Potential  Good    PT Frequency  3x / week   followed by 2x week for an additional 8 weeks   PT Duration  4 weeks   followed by 2x week for an additional 8 weeks   PT Treatment/Interventions  ADLs/Self Care Home Management;Electrical Stimulation;Aquatic Therapy;Gait training;DME Instruction;Stair training;Functional mobility training;Therapeutic activities;Therapeutic exercise;Patient/family education;Neuromuscular re-education;Balance training;Passive range of motion    PT Next Visit Plan  **goals due by Friday** NuStep/SciFit for LE strengthening and endurance. gait with RW, continue sit <> stand training, standing balance, go over counter exercises pt has been performing previously with HHPT and assess safety while performing. standing balance.    PT Home Exercise Plan  [id number]    Consulted and Agree with Plan of Care  Patient;Family member/caregiver  Family Member Consulted  pt's husband [name]       Patient will benefit from skilled therapeutic intervention in order to improve the following deficits and impairments:  Abnormal gait, Decreased activity tolerance, Decreased coordination, Decreased balance, Decreased endurance, Decreased strength, Difficulty walking, Decreased mobility  Visit Diagnosis: Muscle weakness (generalized)  Difficulty in walking, not elsewhere classified  Other abnormalities of gait and mobility     Problem List Patient Active Problem List   Diagnosis Date Noted  . Guillain Barr syndrome ([place]) 01/19/2019  . Cervical myofascial pain syndrome 12/16/2018  . Carpal tunnel syndrome 12/16/2018  . 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 Leg weakness, bilateral 12/08/2018  . IBS (irritable bowel syndrome) 12/08/2018  . GERD (gastroesophageal reflux disease) 09/14/2018  . 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 Abdominal pain, epigastric 06/03/2017  . Constipation  01/27/2016  . History of adenomatous polyp of colon 01/27/2016    Arliss Journey, PT, DPT 02/21/2019, 12:11 PM  [facility] [address] [place], [place], [address] Phone: [phone]   Fax:  [phone]  Name: LAYONNA ARCOS MRN: [id number] Date of Birth: 1956-01-02

## 2019-02-22 ENCOUNTER — Encounter: Payer: Self-pay | Admitting: Physical Therapy

## 2019-02-22 ENCOUNTER — Ambulatory Visit: Payer: 59 | Admitting: Physical Therapy

## 2019-02-22 DIAGNOSIS — R262 Difficulty in walking, not elsewhere classified: Secondary | ICD-10-CM

## 2019-02-22 DIAGNOSIS — M6281 Muscle weakness (generalized): Secondary | ICD-10-CM | POA: Diagnosis not present

## 2019-02-22 DIAGNOSIS — R2689 Other abnormalities of gait and mobility: Secondary | ICD-10-CM

## 2019-02-22 NOTE — Therapy (Signed)
[facility] [address] [place], [place], [address] Phone: [phone]   Fax:  [phone]  Physical Therapy Treatment  Patient Details  Name: Paula Sutton MRN: [id number] Date of Birth: May 13, 1955 Referring Provider (PT): Courtney Heys, MD   Encounter Date: 02/22/2019  PT End of Session - 02/22/19 1037    Visit Number  7    Number of Visits  28    Date for PT Re-Evaluation  04/26/19    Authorization Type  [employer]    PT Start Time  [phone]    PT Stop Time  1014    PT Time Calculation (min)  45 min    Equipment Utilized During Treatment  Gait belt    Activity Tolerance  Patient tolerated treatment well    Behavior During Therapy  [facility] for tasks assessed/performed       Past Medical History:  Diagnosis Date  . Arthritis    knee  . Dyslipidemia   . GERD (gastroesophageal reflux disease)   . Left anterior fascicular block    PATIENT SAYS THIS SHOWED ON EKG DURING COLONOSCOPY , WAS REFERRED TO CARDIOLOGY DR. Minus Breeding  (SEE EPIC ENCOUNTER 09-2017)       Past Surgical History:  Procedure Laterality Date  . ABDOMINAL HYSTERECTOMY    . BIOPSY  08/09/2017   Procedure: BIOPSY;  Surgeon: Danie Binder, MD;  Location: [facility];  Service: Endoscopy;;  gastric biopsy for h pylori  . CARPAL TUNNEL RELEASE Right 12/07/2018  . CATARACT EXTRACTION, BILATERAL     DID 1 YEAR APART , LAST ONE WAS 12-2017  . COLONOSCOPY WITH ESOPHAGOGASTRODUODENOSCOPY (EGD)  2014   [place] PROPOFOL N/A 08/09/2017   3 simple adenomas, diverticulosis in recto-sigmoid, sigmoid, and descending colin, moderate external and internal hemorrhoids  . ESOPHAGOGASTRODUODENOSCOPY (EGD) WITH PROPOFOL N/A 08/09/2017   Low-grade narrowing Schatzki ring due to GERD; small hiatal hernia; mild gastritis and duodenitis due to NSAIDs; biopsy with gastritis  . KNEE ARTHROSCOPY Right 11/2017   WITH DR Wynelle Link  AT  [place] Right   . POLYPECTOMY  08/09/2017   Procedure: POLYPECTOMY;  Surgeon: Danie Binder, MD;  Location: [facility];  Service: Endoscopy;;  cecal polyp hs, transverse colon polyp hs, rectal polyp cs  . TOTAL KNEE REVISION Right 03/01/2018   Procedure: RIGHT TOTAL KNEE REVISION;  Surgeon: Gaynelle Arabian, MD;  Location: [facility];  Service: Orthopedics;  Laterality: Right;  145min    There were no vitals filed for this visit.  Subjective Assessment - 02/22/19 0931    Subjective  Doing well- was tired after yesterday's session. No falls.    Pertinent History  GBS (diagnosed 9/20) history of OSA, GERD, recent CTR right hand    How long can you walk comfortably?  10-15 minutes or 260'    Diagnostic tests  MRI - had imaging done on brain last week, waiting for results -Had  a nerve conduction test last week: 50% on L, 25% on R    Patient Stated Goals  wants to walk completely without a RW    Currently in Pain?  No/denies    Pain Onset  More than a month ago                     [facility] Adult PT Treatment/Exercise - 02/22/19 0001      Ambulation/Gait   Ambulation/Gait  Yes  Ambulation/Gait Assistance  5: Supervision    Ambulation/Gait Assistance Details  Throughout bout, verbal cues to speed up gait speed.    Ambulation Distance (Feet)  115 Feet    Assistive device  Rolling walker    Gait Pattern  Step-through pattern;Decreased weight shift to right;Trunk flexed;Poor foot clearance - right;Decreased hip/knee flexion - right;Decreased hip/knee flexion - left;Decreased dorsiflexion - right;Decreased dorsiflexion - left;Lateral trunk lean to right    Ambulation Surface  Level;Indoor      High Level Balance   High Level Balance Comments  Standing in //bars with BUE support: 2 x 10 reps B heel toe raises, side stepping down and back 3 reps, cues for larger step length, forward marching walking and then backwards walking down and back 3 reps.        Exercises   Other Exercises   Prone quadriceps muscle energy technique B - 5 x 30 seconds, therapist providing support to pt's pelvis to prevent rotation during stretch. 3 x 5 reps knee flexion in prone with focus on eccentric control - attempted light therapist manual resistance during concentric contraction during a couple of reps, pt with increased difficulty performing. Added prone quadriceps stretch to HEP with assistance of pt's spouse. Supine hamstring stretch with therapist assistance 1 x 30 reps B, taught pt how to perform in sitting to add to HEP 2 x 20-30 seconds B, therapist needing to assist pt to perform on R as pt cannot get in proper position, therapist needing to prop pt's R heel on foot.          Access Code: [email or web address]  URL: [email or web address]  Date: 02/22/2019  Prepared by: Janann August   Exercises Supine Heel Slides - 10 reps - 1 sets - 2x daily - 7x weekly Clamshell - 10 reps - 1 sets - 2x daily - 7x weekly Beginner Bridge - 10 reps - 1 sets - 2x daily - 7x weekly Hooklying Isometric Clamshell - 10 reps - 1 sets - 2x daily - 7x weekly Modified Thomas Stretch - 2 sets - 60 hold - 1x daily - 7x weekly Sit to Stand with Counter Support - 10 reps - 2 sets - 1x daily - 5x weekly Mini Squat with Counter Support - 10 reps - 2 sets - 1x daily - 5x weekly  New additions to HEP - with assistance from pt's spouse: Seated Hamstring Stretch - 3 sets - 20-30 hold - 1-2x daily - 7x weekly Prone Quadriceps Stretch with Strap - 3 sets - 20-30 hold - 1-2x daily - 7x weekly       PT Education - 02/22/19 1037    Education Details  hamstring and quadriceps stretch addition to HEP - w/ assistance of pt's spouse to help perform    Person(s) Educated  Patient    Methods  Explanation;Demonstration;Handout    Comprehension  Verbalized understanding;Returned demonstration       PT Short Term Goals - 01/29/19 2148      PT SHORT TERM GOAL #1   Title  Patient will be  independent with initial HEP in order to build upon functional gains in therapy. ALL STGS DUE 02/26/19    Time  4    Period  Weeks    Status  New    Target Date  02/26/19      PT SHORT TERM GOAL #2   Title  Patient will increase baseline BERG score by at least 5 points in order to demo decreased fall risk.  Baseline  not yet assessed.    Time  4    Period  Weeks    Status  New      PT SHORT TERM GOAL #3   Title  Patient will decrease 5 times sit <> stand score to at least 24 seconds with single UE support in order to improve functional LE strength.    Baseline  34.34 seconds with BUE    Time  4    Period  Weeks    Status  New      PT SHORT TERM GOAL #4   Title  Patient will ambulate at least 73' with LRAD and supervision in order to improve endurance for gait.    Time  4    Period  Weeks    Status  New      PT SHORT TERM GOAL #5   Title  Patient will increase gait speed to at least 1.7 ft/sec in order to decrease risk of falls.    Baseline  .77 ft/sec with RW    Time  4    Period  Weeks    Status  New        PT Long Term Goals - 01/29/19 2151      PT LONG TERM GOAL #1   Title  Patient will be independent with final HEP in order to build upon functional gains in therapy. ALL STGS DUE 04/23/19    Time  12    Period  Weeks    Status  New    Target Date  04/23/19      PT LONG TERM GOAL #2   Title  Patient will increase baseline BERG score by at least 10 points in order to demo decreased fall risk.    Time  12    Period  Weeks    Status  New      PT LONG TERM GOAL #3   Title  Patient will ambulate at least 300' outdoors over unlevel surfaces with LRAD and supervision in order to improve community mobility.    Time  12    Period  Weeks    Status  New      PT LONG TERM GOAL #4   Title  Patient will perform TUG in 18 seconds or less with LRAD in order to demo decreased fall risk.    Time  12    Period  Weeks    Status  New      PT LONG TERM GOAL #5   Title   Patient will perform 5x sit <> stand in 18 seconds or less without UE support in order to demo improved LE strength.    Time  12    Period  Weeks    Status  New      Additional Long Term Goals   Additional Long Term Goals  Yes      PT LONG TERM GOAL #6   Title  Patient will improve gait speed to at least 2.6 ft/sec using LRAD in order to demo decreased fall risk and improved community mobility.    Time  12    Period  Weeks    Status  New            Plan - 02/22/19 1038    Clinical Impression Statement  Focus of today's skilled session was stretching for quadriceps/hamstrings, LE strengthening, and standing balance/activity tolerance. Pt able to tolerate prolonged prone position for streching of quadriceps as well as strengthening  of hamstrings. Pt also able to tolerate approx. 7 minutes of standing LE strengthening/balance activities in // bars without needing a sitting rest break or feelings of RLE buckling. Pt is progressing well, will continue to progress towards LTGs.    Personal Factors and Comorbidities  Comorbidity 2;Past/Current Experience    Comorbidities  OSA, GERD    Examination-Activity Limitations  Locomotion Level;Transfers;Stairs;Stand    Examination-Participation Restrictions  Community Activity    Stability/Clinical Decision Making  Evolving/Moderate complexity    Rehab Potential  Good    PT Frequency  3x / week   followed by 2x week for an additional 8 weeks   PT Duration  4 weeks   followed by 2x week for an additional 8 weeks   PT Treatment/Interventions  ADLs/Self Care Home Management;Electrical Stimulation;Aquatic Therapy;Gait training;DME Instruction;Stair training;Functional mobility training;Therapeutic activities;Therapeutic exercise;Patient/family education;Neuromuscular re-education;Balance training;Passive range of motion    PT Next Visit Plan  **goals due by Friday** NuStep/SciFit for LE strengthening and endurance. gait with RW, continue sit <> stand  training, standing balance activites/dynamic gait in // bars. try balance on compliant surfaces in // bars.    PT Home Exercise Plan  [email or web address]    Consulted and Agree with Plan of Care  Patient;Family member/caregiver    Family Member Consulted  pt's husband [name]       Patient will benefit from skilled therapeutic intervention in order to improve the following deficits and impairments:  Abnormal gait, Decreased activity tolerance, Decreased coordination, Decreased balance, Decreased endurance, Decreased strength, Difficulty walking, Decreased mobility  Visit Diagnosis: Muscle weakness (generalized)  Difficulty in walking, not elsewhere classified  Other abnormalities of gait and mobility     Problem List Patient Active Problem List   Diagnosis Date Noted  . Guillain Barr syndrome ([place]) 01/19/2019  . Cervical myofascial pain syndrome 12/16/2018  . Carpal tunnel syndrome 12/16/2018  . 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 Leg weakness, bilateral 12/08/2018  . IBS (irritable bowel syndrome) 12/08/2018  . GERD (gastroesophageal reflux disease) 09/14/2018  . 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 Abdominal pain, epigastric 06/03/2017  . Constipation 01/27/2016  . History of adenomatous polyp of colon 01/27/2016    Arliss Journey 02/22/2019, 10:47 AM  [facility] [address] [place], [place], [address] Phone: [phone]   Fax:  [phone]  Name: MELLISSIA LONGHENRY MRN: [id number] Date of Birth: 1955/12/26

## 2019-02-22 NOTE — Patient Instructions (Signed)
Access Code: [email or web address]  URL: [email or web address]  Date: 02/22/2019  Prepared by: Janann August   Exercises Supine Heel Slides - 10 reps - 1 sets - 2x daily - 7x weekly Clamshell - 10 reps - 1 sets - 2x daily - 7x weekly Beginner Bridge - 10 reps - 1 sets - 2x daily - 7x weekly Hooklying Isometric Clamshell - 10 reps - 1 sets - 2x daily - 7x weekly Modified Thomas Stretch - 2 sets - 60 hold - 1x daily - 7x weekly Sit to Stand with Counter Support - 10 reps - 2 sets - 1x daily - 5x weekly Mini Squat with Counter Support - 10 reps - 2 sets - 1x daily - 5x weekly Seated Hamstring Stretch - 3 sets - 20-30 hold - 1-2x daily - 7x weekly Prone Quadriceps Stretch with Strap - 3 sets - 20-30 hold - 1-2x daily - 7x weekly

## 2019-02-23 ENCOUNTER — Ambulatory Visit: Payer: 59 | Admitting: Physical Therapy

## 2019-02-27 ENCOUNTER — Encounter: Payer: Self-pay | Admitting: Physical Therapy

## 2019-02-27 ENCOUNTER — Ambulatory Visit: Payer: 59 | Admitting: Physical Therapy

## 2019-02-27 ENCOUNTER — Other Ambulatory Visit: Payer: Self-pay

## 2019-02-27 DIAGNOSIS — R262 Difficulty in walking, not elsewhere classified: Secondary | ICD-10-CM

## 2019-02-27 DIAGNOSIS — R29818 Other symptoms and signs involving the nervous system: Secondary | ICD-10-CM

## 2019-02-27 DIAGNOSIS — M6281 Muscle weakness (generalized): Secondary | ICD-10-CM

## 2019-02-27 DIAGNOSIS — R2689 Other abnormalities of gait and mobility: Secondary | ICD-10-CM

## 2019-02-27 NOTE — Therapy (Signed)
[facility] [address] [place], [place], [address] Phone: [phone]   Fax:  [phone]  Physical Therapy Treatment  Patient Details  Name: Paula Sutton MRN: [id number] Date of Birth: 1955-05-26 Referring Provider (PT): Courtney Heys, MD   Encounter Date: 02/27/2019  PT End of Session - 02/27/19 1213    Visit Number  8    Number of Visits  28    Date for PT Re-Evaluation  04/26/19    Authorization Type  [employer]    PT Start Time  1018    PT Stop Time  1100    PT Time Calculation (min)  42 min    Equipment Utilized During Treatment  Gait belt    Activity Tolerance  Patient tolerated treatment well    Behavior During Therapy  WFL for tasks assessed/performed       Past Medical History:  Diagnosis Date  . Arthritis    knee  . Dyslipidemia   . GERD (gastroesophageal reflux disease)   . Left anterior fascicular block    PATIENT SAYS THIS SHOWED ON EKG DURING COLONOSCOPY , WAS REFERRED TO CARDIOLOGY DR. Minus Breeding  (SEE EPIC ENCOUNTER 09-2017)       Past Surgical History:  Procedure Laterality Date  . ABDOMINAL HYSTERECTOMY    . BIOPSY  08/09/2017   Procedure: BIOPSY;  Surgeon: Danie Binder, MD;  [facility];  Service: Endoscopy;;  gastric biopsy for h pylori  . CARPAL TUNNEL RELEASE Right 12/07/2018  . CATARACT EXTRACTION, BILATERAL     DID 1 YEAR APART , LAST ONE WAS 12-2017  . COLONOSCOPY WITH ESOPHAGOGASTRODUODENOSCOPY (EGD)  2014   [facility] PROPOFOL N/A 08/09/2017   3 simple adenomas, diverticulosis in recto-sigmoid, sigmoid, and descending colin, moderate external and internal hemorrhoids  . ESOPHAGOGASTRODUODENOSCOPY (EGD) WITH PROPOFOL N/A 08/09/2017   Low-grade narrowing Schatzki ring due to GERD; small hiatal hernia; mild gastritis and duodenitis due to NSAIDs; biopsy with gastritis  . KNEE ARTHROSCOPY Right 11/2017   WITH DR Wynelle Link  AT  [facility] Right   . POLYPECTOMY  08/09/2017   Procedure: POLYPECTOMY;  Surgeon: Danie Binder, MD;  [facility];  Service: Endoscopy;;  cecal polyp hs, transverse colon polyp hs, rectal polyp cs  . TOTAL KNEE REVISION Right 03/01/2018   Procedure: RIGHT TOTAL KNEE REVISION;  Surgeon: Gaynelle Arabian, MD;  Location: [facility];  Service: Orthopedics;  Laterality: Right;  183mn    There were no vitals filed for this visit.  Subjective Assessment - 02/27/19 1022    Subjective  Felt sore after last session. No falls.    Pertinent History  GBS (diagnosed 9/20) history of OSA, GERD, recent CTR right hand    How long can you walk comfortably?  10-15 minutes or 260'    Diagnostic tests  MRI - had imaging done on brain last week, waiting for results -Had  a nerve conduction test last week: 50% on L, 25% on R    Patient Stated Goals  wants to walk completely without a RW    Currently in Pain?  No/denies    Pain Onset  More than a month ago         [facility] - 02/27/19 1030      Ambulation/Gait   Ambulation/Gait  Yes    Ambulation/Gait Assistance  5: Supervision    Ambulation Distance (Feet)  230 Feet    Assistive device  Rolling walker    Gait Pattern  Step-through pattern;Decreased weight shift to right;Trunk flexed;Poor foot clearance - right;Decreased hip/knee flexion - right;Decreased hip/knee flexion - left;Decreased dorsiflexion - right;Decreased dorsiflexion - left;Lateral trunk lean to right    Ambulation Surface  Level;Indoor    Gait velocity  16.75 seconds = 1.96 ft/sec      Standardized Balance Assessment   Standardized Balance Assessment  Berg Balance Test      Berg Balance Test   Sit to Stand  Able to stand  independently using hands    Standing Unsupported  Able to stand safely 2 minutes    Sitting with Back Unsupported but Feet Supported on Floor or Stool  Able to sit safely and securely 2 minutes    Stand to Sit  Controls descent  by using hands    Transfers  Able to transfer safely, definite need of hands    Standing Unsupported with Eyes Closed  Able to stand 10 seconds safely    Standing Unsupported with Feet Together  Able to place feet together independently and stand 1 minute safely    From Standing, Reach Forward with Outstretched Arm  Can reach forward >12 cm safely (5")   9"   From Standing Position, Pick up Object from Floor  Unable to pick up and needs supervision    From Standing Position, Turn to Look Behind Over each Shoulder  Looks behind from both sides and weight shifts well    Turn 360 Degrees  Able to turn 360 degrees safely but slowly   11 seconds B   Standing Unsupported, Alternately Place Feet on Step/Stool  Needs assistance to keep from falling or unable to try    Standing Unsupported, One Foot in Front  Able to take small step independently and hold 30 seconds    Standing on One Leg  Unable to try or needs assist to prevent fall    Total Score  37    Berg comment:  37/56                   [facility] Adult PT Treatment/Exercise - 02/27/19 1030      Transfers   Comments  1 x 10 reps sit <> stands from edge of higher mat table with focus on anterior weight shifting and eccentric control when lowering (need for UE assist) - during last couple of reps placed small bolster under pt as target to sit to and then stand back up without UE support. Ball placed between LEs to prevent ADD and for proper positioning.       Therapeutic Activites    Therapeutic Activities  Other Therapeutic Activities    Other Therapeutic Activities  Pt stating that she has been testing herself at home to see how far she can bend down to pick up objects. Therapist educating pt that based on her BERG score and difficulty picking up an object without assistance/supervision that pt should not be doing this at home. For now, should ask her husband to help pick up object (pt does not have a reacher) in order to decrease risk  of falls. Demonstrated techniques to pick up objects (as during 1st rep pt uses lumbar forward flexion to initiate) - to widen BOS and use legs in squat position, to stagger stance in a wide BOS and use UE on nearby chair/mat for support. Pt needed min guard when widening BOS to pick up object when standing  in RW. Will further address in future sessions for safety and technique.               PT Education - 02/27/19 1212    Education Details  progress towards STGs.    Person(s) Educated  Patient    Methods  Explanation    Comprehension  Verbalized understanding       PT Short Term Goals - 02/27/19 1023      PT SHORT TERM GOAL #1   Title  Patient will be independent with initial HEP in order to build upon functional gains in therapy. ALL STGS DUE 02/26/19    Baseline  pt subjectively reports she is doing her exercises at home.    Time  4    Period  Weeks    Status  Achieved    Target Date  02/26/19      PT SHORT TERM GOAL #2   Title  Patient will increase baseline BERG score by at least 5 points in order to demo decreased fall risk.    Baseline  37/56 on 02/27/19, previously 30/56    Time  4    Period  Weeks    Status  Achieved      PT SHORT TERM GOAL #3   Title  Patient will decrease 5 times sit <> stand score to at least 24 seconds with single UE support in order to improve functional LE strength.    Baseline  23.44 seconds with BUE support from standard arm chair on 02/27/19    Time  4    Period  Weeks    Status  Partially Met      PT SHORT TERM GOAL #4   Title  Patient will ambulate at least 66' with LRAD and supervision in order to improve endurance for gait.    Baseline  achieved on 02/27/19    Time  4    Period  Weeks    Status  Achieved      PT SHORT TERM GOAL #5   Title  Patient will increase gait speed to at least 1.7 ft/sec in order to decrease risk of falls.    Baseline  16.75 seconds = 1.96 ft/sec    Time  4    Period  Weeks    Status  Achieved       upgraded STGSs:   PT Short Term Goals - 02/27/19 1224      PT SHORT TERM GOAL #1   Title  Patient will be independent with ongoing HEP in order to build upon functional gains in therapy. ALL STGS DUE 03/27/19    Time  4    Period  Weeks    Status  On-going    Target Date  03/27/19      PT SHORT TERM GOAL #2   Title  Patient will increase BERG score to at least 40/56 in order to demo decr fall risk.    Baseline  37/56 on 02/27/19, previously 30/56    Time  4    Period  Weeks    Status  Revised      PT SHORT TERM GOAL #3   Title  Patient will decrease 5 times sit <> stand score to at least 22 seconds with single UE support in order to improve functional LE strength.    Baseline  23.44 seconds with BUE support from standard arm chair on 02/27/19    Time  4    Period  Weeks  Status  Revised      PT SHORT TERM GOAL #4   Title  Patient will ambulate at least 230' over outdoor surfaces with RW and supervision in order to improve endurance for gait and community mobility.    Time  4    Period  Weeks    Status  Revised      PT SHORT TERM GOAL #5   Title  Patient will increase gait speed to at least 2.3 ft/sec in order to decrease risk of falls.    Baseline  16.75 seconds = 1.96 ft/sec    Time  4    Period  Weeks    Status  Revised      Additional Short Term Goals   Additional Short Term Goals  Yes      PT SHORT TERM GOAL #6   Title  Patient will demonstrate proper technique to pick up an object from the floor at least 5 reps with supervision and no LOB.    Time  4    Period  Weeks    Status  New        PT Long Term Goals - 01/29/19 2151      PT LONG TERM GOAL #1   Title  Patient will be independent with final HEP in order to build upon functional gains in therapy. ALL STGS DUE 04/23/19    Time  12    Period  Weeks    Status  New    Target Date  04/23/19      PT LONG TERM GOAL #2   Title  Patient will increase baseline BERG score by at least 10 points in order to  demo decreased fall risk.    Time  12    Period  Weeks    Status  New      PT LONG TERM GOAL #3   Title  Patient will ambulate at least 300' outdoors over unlevel surfaces with LRAD and supervision in order to improve community mobility.    Time  12    Period  Weeks    Status  New      PT LONG TERM GOAL #4   Title  Patient will perform TUG in 18 seconds or less with LRAD in order to demo decreased fall risk.    Time  12    Period  Weeks    Status  New      PT LONG TERM GOAL #5   Title  Patient will perform 5x sit <> stand in 18 seconds or less without UE support in order to demo improved LE strength.    Time  12    Period  Weeks    Status  New      Additional Long Term Goals   Additional Long Term Goals  Yes      PT LONG TERM GOAL #6   Title  Patient will improve gait speed to at least 2.6 ft/sec using LRAD in order to demo decreased fall risk and improved community mobility.    Time  12    Period  Weeks    Status  New            Plan - 02/27/19 1222    Clinical Impression Statement  Focus of today's skilled session was assessing pt's STGs. Pt has met 4 out of 5 STGs. Pt improved her BERG score to a 37/56 (previously was 30/56) and gait speed with RW to 1.96 ft/sec - decreasing  pt's risk for falls. Pt able to decrease her 5x sit <> stand score to 23.44 seconds but still needs to use BUE to stand up from standard arm chair. Revised/upgraded STGs for next 4 weeks. Remainder of session focused on sit <> stands from higher surfaces without use of UE. Pt is progressing well, will continue to progress towards LTGs.    Personal Factors and Comorbidities  Comorbidity 2;Past/Current Experience    Comorbidities  OSA, GERD    Examination-Activity Limitations  Locomotion Level;Transfers;Stairs;Stand    Examination-Participation Restrictions  Community Activity    Stability/Clinical Decision Making  Evolving/Moderate complexity    Rehab Potential  Good    PT Frequency  3x / week    followed by 2x week for an additional 8 weeks   PT Duration  4 weeks   followed by 2x week for an additional 8 weeks   PT Treatment/Interventions  ADLs/Self Care Home Management;Electrical Stimulation;Aquatic Therapy;Gait training;DME Instruction;Stair training;Functional mobility training;Therapeutic activities;Therapeutic exercise;Patient/family education;Neuromuscular re-education;Balance training;Passive range of motion    PT Next Visit Plan  practice picking up objects from higher surfaces/ proper technique.  NuStep/SciFit for LE strengthening and endurance. gait with RW, continue sit <> stand training, standing balance activites/dynamic gait in // bars. try balance on compliant surfaces in // bars.    PT Home Exercise Plan  [id number]    Consulted and Agree with Plan of Care  Patient;Family member/caregiver    Family Member Consulted  pt's husband [name]       Patient will benefit from skilled therapeutic intervention in order to improve the following deficits and impairments:  Abnormal gait, Decreased activity tolerance, Decreased coordination, Decreased balance, Decreased endurance, Decreased strength, Difficulty walking, Decreased mobility  Visit Diagnosis: Muscle weakness (generalized)  Difficulty in walking, not elsewhere classified  Other abnormalities of gait and mobility  Other symptoms and signs involving the nervous system     Problem List Patient Active Problem List   Diagnosis Date Noted  . Guillain Barr syndrome ([place]) 01/19/2019  . Cervical myofascial pain syndrome 12/16/2018  . Carpal tunnel syndrome 12/16/2018  . 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 Leg weakness, bilateral 12/08/2018  . IBS (irritable bowel syndrome) 12/08/2018  . GERD (gastroesophageal reflux disease) 09/14/2018  . 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 Abdominal pain, epigastric 06/03/2017  . Constipation 01/27/2016  . History of adenomatous polyp of colon  01/27/2016    Arliss Journey, PT, DPT  02/27/2019, 12:23 PM  [facility] [address] [place], [place], [address] Phone: [phone]   Fax:  [phone]  Name: BRANDIS WIXTED MRN: [id number] Date of Birth: [birth year]

## 2019-03-01 ENCOUNTER — Other Ambulatory Visit: Payer: Self-pay

## 2019-03-01 ENCOUNTER — Ambulatory Visit: Payer: 59 | Admitting: Physical Therapy

## 2019-03-01 ENCOUNTER — Other Ambulatory Visit: Payer: Self-pay | Admitting: Physical Medicine and Rehabilitation

## 2019-03-01 ENCOUNTER — Encounter: Payer: Self-pay | Admitting: Physical Therapy

## 2019-03-01 DIAGNOSIS — M6281 Muscle weakness (generalized): Secondary | ICD-10-CM | POA: Diagnosis not present

## 2019-03-01 DIAGNOSIS — R2689 Other abnormalities of gait and mobility: Secondary | ICD-10-CM

## 2019-03-01 DIAGNOSIS — R262 Difficulty in walking, not elsewhere classified: Secondary | ICD-10-CM

## 2019-03-01 NOTE — Therapy (Addendum)
[facility] [address] [place], [place], [address] Phone: [phone]   Fax:  [phone]  Physical Therapy Treatment  Patient Details  Name: Paula Sutton MRN: [id number] Date of Birth: [birth year] Referring Provider (PT): Courtney Heys, MD   Encounter Date: 03/01/2019  PT End of Session - 03/01/19 1204    Visit Number  9    Number of Visits  28    Date for PT Re-Evaluation  04/26/19    Authorization Type  [employer]    PT Start Time  1018    PT Stop Time  1100    PT Time Calculation (min)  42 min    Equipment Utilized During Treatment  Gait belt    Activity Tolerance  Patient tolerated treatment well    Behavior During Therapy  WFL for tasks assessed/performed       Past Medical History:  Diagnosis Date  . Arthritis    knee  . Dyslipidemia   . GERD (gastroesophageal reflux disease)   . Left anterior fascicular block    PATIENT SAYS THIS SHOWED ON EKG DURING COLONOSCOPY , WAS REFERRED TO CARDIOLOGY DR. Minus Breeding  (SEE EPIC ENCOUNTER 09-2017)       Past Surgical History:  Procedure Laterality Date  . ABDOMINAL HYSTERECTOMY    . BIOPSY  08/09/2017   Procedure: BIOPSY;  Surgeon: Danie Binder, MD;  [facility];  Service: Endoscopy;;  gastric biopsy for h pylori  . CARPAL TUNNEL RELEASE Right 12/07/2018  . CATARACT EXTRACTION, BILATERAL     DID 1 YEAR APART , LAST ONE WAS 12-2017  . COLONOSCOPY WITH ESOPHAGOGASTRODUODENOSCOPY (EGD)  2014   [facility] PROPOFOL N/A 08/09/2017   3 simple adenomas, diverticulosis in recto-sigmoid, sigmoid, and descending colin, moderate external and internal hemorrhoids  . ESOPHAGOGASTRODUODENOSCOPY (EGD) WITH PROPOFOL N/A 08/09/2017   Low-grade narrowing Schatzki ring due to GERD; small hiatal hernia; mild gastritis and duodenitis due to NSAIDs; biopsy with gastritis  . KNEE ARTHROSCOPY Right 11/2017   WITH DR Wynelle Link  AT  [facility] Right   . POLYPECTOMY  08/09/2017   Procedure: POLYPECTOMY;  Surgeon: Danie Binder, MD;  [facility];  Service: Endoscopy;;  cecal polyp hs, transverse colon polyp hs, rectal polyp cs  . TOTAL KNEE REVISION Right 03/01/2018   Procedure: RIGHT TOTAL KNEE REVISION;  Surgeon: Gaynelle Arabian, MD;  [facility];  Service: Orthopedics;  Laterality: Right;  184min    There were no vitals filed for this visit.  Subjective Assessment - 03/01/19 1021    Subjective  Exercises are going well at home. Felt good this morning - not as tight this morning.    Pertinent History  GBS (diagnosed 9/20) history of OSA, GERD, recent CTR right hand    How long can you walk comfortably?  10-15 minutes or 260'    Diagnostic tests  MRI - had imaging done on brain last week, waiting for results -Had  a nerve conduction test last week: 50% on L, 25% on R    Patient Stated Goals  wants to walk completely without a RW    Currently in Pain?  No/denies    Pain Onset  More than a month ago                       [facility] Adult PT Treatment/Exercise - 03/01/19 0001  Ambulation/Gait   Ambulation/Gait  Yes    Ambulation/Gait Assistance  5: Supervision    Ambulation/Gait Assistance Details  cues to relax shoulders during gait    Ambulation Distance (Feet)  230 Feet    Assistive device  Rolling walker    Gait Pattern  Step-through pattern;Decreased weight shift to right;Trunk flexed;Poor foot clearance - right;Decreased hip/knee flexion - right;Decreased hip/knee flexion - left;Decreased dorsiflexion - right;Decreased dorsiflexion - left;Lateral trunk lean to right    Ambulation Surface  Level;Indoor      Exercises   Other Exercises   NuStep BLE and BUE for strengthening, endurance, ROM, and activity tolerance level 4 for 6 minutes - red theraband around distal thighs for cueing for hip ABD. 2 x 7 reps clamshells with red theraband B - initial verbal and  tactile cues for technique. 1 x 10 reps B bent knee fall outs, cues for eccentric control with green theraband - upgraded HEP. Standing in // bars: 1 x 7 reps B standing knee flexion with BUE support. Attempted standing marching however pt unable to perform without compensation for circumduction/hip hiking.              PT Education - 03/01/19 1204    Education Details  upgrades to [employer]) Educated  Patient    Methods  Explanation;Demonstration    Comprehension  Verbalized understanding;Returned demonstration       PT Short Term Goals - 02/27/19 1224      PT SHORT TERM GOAL #1   Title  Patient will be independent with ongoing HEP in order to build upon functional gains in therapy. ALL STGS DUE 03/27/19    Time  4    Period  Weeks    Status  On-going    Target Date  03/27/19      PT SHORT TERM GOAL #2   Title  Patient will increase BERG score to at least 40/56 in order to demo decr fall risk.    Baseline  37/56 on 02/27/19, previously 30/56    Time  4    Period  Weeks    Status  Revised      PT SHORT TERM GOAL #3   Title  Patient will decrease 5 times sit <> stand score to at least 22 seconds with single UE support in order to improve functional LE strength.    Baseline  23.44 seconds with BUE support from standard arm chair on 02/27/19    Time  4    Period  Weeks    Status  Revised      PT SHORT TERM GOAL #4   Title  Patient will ambulate at least 230' over outdoor surfaces with RW and supervision in order to improve endurance for gait and community mobility.    Time  4    Period  Weeks    Status  Revised      PT SHORT TERM GOAL #5   Title  Patient will increase gait speed to at least 2.3 ft/sec in order to decrease risk of falls.    Baseline  16.75 seconds = 1.96 ft/sec    Time  4    Period  Weeks    Status  Revised      Additional Short Term Goals   Additional Short Term Goals  Yes      PT SHORT TERM GOAL #6   Title  Patient will demonstrate proper  technique to pick up an object from the floor  at least 5 reps with supervision and no LOB.    Time  4    Period  Weeks    Status  New        PT Long Term Goals - 01/29/19 2151      PT LONG TERM GOAL #1   Title  Patient will be independent with final HEP in order to build upon functional gains in therapy. ALL STGS DUE 04/23/19    Time  12    Period  Weeks    Status  New    Target Date  04/23/19      PT LONG TERM GOAL #2   Title  Patient will increase baseline BERG score by at least 10 points in order to demo decreased fall risk.    Time  12    Period  Weeks    Status  New      PT LONG TERM GOAL #3   Title  Patient will ambulate at least 300' outdoors over unlevel surfaces with LRAD and supervision in order to improve community mobility.    Time  12    Period  Weeks    Status  New      PT LONG TERM GOAL #4   Title  Patient will perform TUG in 18 seconds or less with LRAD in order to demo decreased fall risk.    Time  12    Period  Weeks    Status  New      PT LONG TERM GOAL #5   Title  Patient will perform 5x sit <> stand in 18 seconds or less without UE support in order to demo improved LE strength.    Time  12    Period  Weeks    Status  New      Additional Long Term Goals   Additional Long Term Goals  Yes      PT LONG TERM GOAL #6   Title  Patient will improve gait speed to at least 2.6 ft/sec using LRAD in order to demo decreased fall risk and improved community mobility.    Time  12    Period  Weeks    Status  New            Plan - 03/01/19 1206    Clinical Impression Statement  Focus of today's session focused on LE strengthening. Pt tolerated well - able to progress clamshells with addition of red theraband and bent knee fall outs to green theraband for HEP. Pt continues to have difficulty with hip flexion B against gravity - able to minimally lift BLE into hip flexion against gravity while seated in chair with back support. Pt remains very motivated -  will continue to progress towards LTGs.    Personal Factors and Comorbidities  Comorbidity 2;Past/Current Experience    Comorbidities  OSA, GERD    Examination-Activity Limitations  Locomotion Level;Transfers;Stairs;Stand    Examination-Participation Restrictions  Community Activity    Stability/Clinical Decision Making  Evolving/Moderate complexity    Rehab Potential  Good    PT Frequency  3x / week   followed by 2x week for an additional 8 weeks   PT Duration  4 weeks   followed by 2x week for an additional 8 weeks   PT Treatment/Interventions  ADLs/Self Care Home Management;Electrical Stimulation;Aquatic Therapy;Gait training;DME Instruction;Stair training;Functional mobility training;Therapeutic activities;Therapeutic exercise;Patient/family education;Neuromuscular re-education;Balance training;Passive range of motion    PT Next Visit Plan  powder board hip flexion? gait outdoors when weather  permits/curb training. practice picking up objects from higher surfaces/ proper technique.  NuStep/SciFit for LE strengthening and endurance. gait with RW, continue sit <> stand training, standing balance activites/dynamic gait in // bars. try balance on compliant surfaces in // bars.    PT Home Exercise Plan  [id number]    Consulted and Agree with Plan of Care  Patient;Family member/caregiver    Family Member Consulted  pt's husband [name]       Patient will benefit from skilled therapeutic intervention in order to improve the following deficits and impairments:  Abnormal gait, Decreased activity tolerance, Decreased coordination, Decreased balance, Decreased endurance, Decreased strength, Difficulty walking, Decreased mobility  Visit Diagnosis: Difficulty in walking, not elsewhere classified  Muscle weakness (generalized)  Other abnormalities of gait and mobility     Problem List Patient Active Problem List   Diagnosis Date Noted  . Guillain Barr syndrome ([place]) 01/19/2019  . Cervical  myofascial pain syndrome 12/16/2018  . Carpal tunnel syndrome 12/16/2018  . 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 Leg weakness, bilateral 12/08/2018  . IBS (irritable bowel syndrome) 12/08/2018  . GERD (gastroesophageal reflux disease) 09/14/2018  . 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 Abdominal pain, epigastric 06/03/2017  . Constipation 01/27/2016  . History of adenomatous polyp of colon 01/27/2016    Arliss Journey, PT, DPT  03/01/2019, 4:46 PM  [facility] [address] [place], [place], [address] Phone: [phone]   Fax:  [phone]  Name: Paula Sutton MRN: [id number] Date of Birth: 06/20/1955

## 2019-03-01 NOTE — Telephone Encounter (Signed)
Recieved electronic medication refill request for cymbalta medication for this patient.  No mention of med in previous notes.  Unsure if ok to refill

## 2019-03-02 ENCOUNTER — Encounter: Payer: Self-pay | Admitting: Physical Therapy

## 2019-03-02 ENCOUNTER — Ambulatory Visit: Payer: 59 | Admitting: Physical Therapy

## 2019-03-02 DIAGNOSIS — R2689 Other abnormalities of gait and mobility: Secondary | ICD-10-CM

## 2019-03-02 DIAGNOSIS — R262 Difficulty in walking, not elsewhere classified: Secondary | ICD-10-CM

## 2019-03-02 DIAGNOSIS — M6281 Muscle weakness (generalized): Secondary | ICD-10-CM | POA: Diagnosis not present

## 2019-03-02 NOTE — Therapy (Signed)
[facility] [address] [place], [place], [address] Phone: [phone]   Fax:  [phone]  Physical Therapy Treatment/10th Visit Progress Note  Patient Details  Name: Paula Sutton MRN: [id number] Date of Birth: [birth year] Referring Provider (PT): Courtney Heys, MD  Progress Note Reporting Period 01/26/19 to 03/02/19   See note below for Objective Data and Assessment of Progress/Goals.    Encounter Date: 03/02/2019  PT End of Session - 03/02/19 1202    Visit Number  10    Number of Visits  28    Date for PT Re-Evaluation  04/26/19    Authorization Type  [employer], Medicare Secondary    PT Start Time  1106    PT Stop Time  1146    PT Time Calculation (min)  40 min    Equipment Utilized During Treatment  Gait belt    Activity Tolerance  Patient tolerated treatment well    Behavior During Therapy  WFL for tasks assessed/performed       Past Medical History:  Diagnosis Date  . Arthritis    knee  . Dyslipidemia   . GERD (gastroesophageal reflux disease)   . Left anterior fascicular block    PATIENT SAYS THIS SHOWED ON EKG DURING COLONOSCOPY , WAS REFERRED TO CARDIOLOGY DR. Minus Breeding  (SEE EPIC ENCOUNTER 09-2017)       Past Surgical History:  Procedure Laterality Date  . ABDOMINAL HYSTERECTOMY    . BIOPSY  08/09/2017   Procedure: BIOPSY;  Surgeon: Danie Binder, MD;  [facility];  Service: Endoscopy;;  gastric biopsy for h pylori  . CARPAL TUNNEL RELEASE Right 12/07/2018  . CATARACT EXTRACTION, BILATERAL     DID 1 YEAR APART , LAST ONE WAS 12-2017  . COLONOSCOPY WITH ESOPHAGOGASTRODUODENOSCOPY (EGD)  2014   [place] PROPOFOL N/A 08/09/2017   3 simple adenomas, diverticulosis in recto-sigmoid, sigmoid, and descending colin, moderate external and internal hemorrhoids  . ESOPHAGOGASTRODUODENOSCOPY (EGD) WITH PROPOFOL N/A 08/09/2017   Low-grade  narrowing Schatzki ring due to GERD; small hiatal hernia; mild gastritis and duodenitis due to NSAIDs; biopsy with gastritis  . KNEE ARTHROSCOPY Right 11/2017   WITH DR Wynelle Link  AT [place] Right   . POLYPECTOMY  08/09/2017   Procedure: POLYPECTOMY;  Surgeon: Danie Binder, MD;  [facility];  Service: Endoscopy;;  cecal polyp hs, transverse colon polyp hs, rectal polyp cs  . TOTAL KNEE REVISION Right 03/01/2018   Procedure: RIGHT TOTAL KNEE REVISION;  Surgeon: Gaynelle Arabian, MD;  Location: [facility];  Service: Orthopedics;  Laterality: Right;  132mn    There were no vitals filed for this visit.  Subjective Assessment - 03/02/19 1108    Subjective  Fell last night - was just standing at her RW going into the sunroom, right leg gave out, fell on the left side. Had to have husband help her get up. Needs help getting up from the ground as it is painful for her husband to help get her up.    Pertinent History  GBS (diagnosed 9/20) history of OSA, GERD, recent CTR right hand    How long can you walk comfortably?  10-15 minutes or 260'    Diagnostic tests  MRI - had imaging done on brain last week, waiting for results -Had  a nerve conduction test last week: 50% on L, 25% on R    Patient  Stated Goals  wants to walk completely without a RW    Currently in Pain?  No/denies    Pain Onset  More than a month ago                       [facility] Adult PT Treatment/Exercise - 03/02/19 0001      Therapeutic Activites    Other Therapeutic Activities  Pt had a fall yesterday - was just standing and her leg buckled and fell to L side - husband had to pick her up from the floor. Discussed with pt fall recovery strategies for home. Due to pt having poor knees and hx of knee surgery on R, first demonstrated strategy from long sitting and bumping up to a 6" step (pt also has one at home) and then using BUE to help push up into wheelchair seat. Mod A from therapist to  slowly lower pt to the ground and get into long sitting position. Pt able to use BUE on step to help push up to sit on step, from there cues from therapist to bring BLE closer in towards body to help push to stand. Instructed pt to grab onto sturdy seat of w/c/arm rest (pt initially went to grab for brakes) and push up to w/c seat. Min A from therapist using gait belt. Instructed pt on pt's spouse to use gait belt to help lift pt up to w/c and perform in a squat position (demonstrated proper mechanics) in order to decrease stress on low back.       Exercises   Other Exercises   On blue mat table in sidelying B with powder board with focus on hip flexion>extension, 2 x 10 reps with no resistance - couple holds in extension for additional hip flexor stretch, 1 x 10 reps with resistance from red theraband B. Pt with limited sensation - able to feel muscle activation on R, unable to feel on L.              PT Education - 03/02/19 1202    Education Details  fall recovery strategy at home - see TA    Person(s) Educated  Patient    Methods  Explanation;Demonstration;Verbal cues    Comprehension  Verbalized understanding;Returned demonstration;Verbal cues required       PT Short Term Goals - 02/27/19 1224      PT SHORT TERM GOAL #1   Title  Patient will be independent with ongoing HEP in order to build upon functional gains in therapy. ALL STGS DUE 03/27/19    Time  4    Period  Weeks    Status  On-going    Target Date  03/27/19      PT SHORT TERM GOAL #2   Title  Patient will increase BERG score to at least 40/56 in order to demo decr fall risk.    Baseline  37/56 on 02/27/19, previously 30/56    Time  4    Period  Weeks    Status  Revised      PT SHORT TERM GOAL #3   Title  Patient will decrease 5 times sit <> stand score to at least 22 seconds with single UE support in order to improve functional LE strength.    Baseline  23.44 seconds with BUE support from standard arm chair on  02/27/19    Time  4    Period  Weeks    Status  Revised  PT SHORT TERM GOAL #4   Title  Patient will ambulate at least 230' over outdoor surfaces with RW and supervision in order to improve endurance for gait and community mobility.    Time  4    Period  Weeks    Status  Revised      PT SHORT TERM GOAL #5   Title  Patient will increase gait speed to at least 2.3 ft/sec in order to decrease risk of falls.    Baseline  16.75 seconds = 1.96 ft/sec    Time  4    Period  Weeks    Status  Revised      Additional Short Term Goals   Additional Short Term Goals  Yes      PT SHORT TERM GOAL #6   Title  Patient will demonstrate proper technique to pick up an object from the floor at least 5 reps with supervision and no LOB.    Time  4    Period  Weeks    Status  New        PT Long Term Goals - 01/29/19 2151      PT LONG TERM GOAL #1   Title  Patient will be independent with final HEP in order to build upon functional gains in therapy. ALL STGS DUE 04/23/19    Time  12    Period  Weeks    Status  New    Target Date  04/23/19      PT LONG TERM GOAL #2   Title  Patient will increase baseline BERG score by at least 10 points in order to demo decreased fall risk.    Time  12    Period  Weeks    Status  New      PT LONG TERM GOAL #3   Title  Patient will ambulate at least 300' outdoors over unlevel surfaces with LRAD and supervision in order to improve community mobility.    Time  12    Period  Weeks    Status  New      PT LONG TERM GOAL #4   Title  Patient will perform TUG in 18 seconds or less with LRAD in order to demo decreased fall risk.    Time  12    Period  Weeks    Status  New      PT LONG TERM GOAL #5   Title  Patient will perform 5x sit <> stand in 18 seconds or less without UE support in order to demo improved LE strength.    Time  12    Period  Weeks    Status  New      Additional Long Term Goals   Additional Long Term Goals  Yes      PT LONG TERM GOAL  #6   Title  Patient will improve gait speed to at least 2.6 ft/sec using LRAD in order to demo decreased fall risk and improved community mobility.    Time  12    Period  Weeks    Status  New            Plan - 03/02/19 1218    Clinical Impression Statement  10th visit progress note: STGs assessed on 02/26/19 with patient achieving 4 out of 5 STGs.  Pt has met 4 out of 5 STGs. Pt improved her BERG score to a 37/56 (previously was 30/56) and gait speed with RW to 1.96 ft/sec - decreasing  pt's risk for falls. Pt able to decrease her 5x sit <> stand score to 23.44 seconds but still needs to use BUE to stand up from standard arm chair. Pt's gait speed with RW now demonstrates limited community ambulator. Pt had a fall yesterday from standing, pt not knowing how it happened. Heavy focus today on practicing floor > w/c transfers and fall recovery if another fall were to happen at home. Educated pt on how to instruct her husband and using gait belt/6" step to help bump back up into w/c from long sitting. Pt is progressing well - will continue to progress towards LTGs.    Personal Factors and Comorbidities  Comorbidity 2;Past/Current Experience    Comorbidities  OSA, GERD    Examination-Activity Limitations  Locomotion Level;Transfers;Stairs;Stand    Examination-Participation Restrictions  Community Activity    Stability/Clinical Decision Making  Evolving/Moderate complexity    Rehab Potential  Good    PT Frequency  3x / week   followed by 2x week for an additional 8 weeks   PT Duration  4 weeks   followed by 2x week for an additional 8 weeks   PT Treatment/Interventions  ADLs/Self Care Home Management;Electrical Stimulation;Aquatic Therapy;Gait training;DME Instruction;Stair training;Functional mobility training;Therapeutic activities;Therapeutic exercise;Patient/family education;Neuromuscular re-education;Balance training;Passive range of motion    PT Next Visit Plan  powder board hip flexion?  gait outdoors when weather permits/curb training. practice picking up objects from higher surfaces/ proper technique.  NuStep/SciFit for LE strengthening and endurance. gait with RW, continue sit <> stand training, standing balance activites/dynamic gait in // bars. try balance on compliant surfaces in // bars.    PT Home Exercise Plan  [email or web address]    Consulted and Agree with Plan of Care  Patient;Family member/caregiver    Family Member Consulted  pt's husband [name]       Patient will benefit from skilled therapeutic intervention in order to improve the following deficits and impairments:  Abnormal gait, Decreased activity tolerance, Decreased coordination, Decreased balance, Decreased endurance, Decreased strength, Difficulty walking, Decreased mobility  Visit Diagnosis: Muscle weakness (generalized)  Difficulty in walking, not elsewhere classified  Other abnormalities of gait and mobility     Problem List Patient Active Problem List   Diagnosis Date Noted  . Guillain Barr syndrome ([place]) 01/19/2019  . Cervical myofascial pain syndrome 12/16/2018  . Carpal tunnel syndrome 12/16/2018  . Axonal GBS (Guillain-Barre syndrome) ([place]) 12/15/2018  . Leg weakness, bilateral 12/08/2018  . IBS (irritable bowel syndrome) 12/08/2018  . GERD (gastroesophageal reflux disease) 09/14/2018  . 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 Abdominal pain, epigastric 06/03/2017  . Constipation 01/27/2016  . History of adenomatous polyp of colon 01/27/2016    Arliss Journey, PT, DPT  03/02/2019, 12:18 PM  [facility] [address] [place], [place], [address] Phone: [phone]   Fax:  [phone]  Name: Paula Sutton MRN: [id number] Date of Birth: 10/14/1955

## 2019-03-05 ENCOUNTER — Ambulatory Visit: Payer: 59 | Admitting: Physical Therapy

## 2019-03-05 ENCOUNTER — Other Ambulatory Visit: Payer: Self-pay

## 2019-03-05 DIAGNOSIS — M6281 Muscle weakness (generalized): Secondary | ICD-10-CM | POA: Diagnosis not present

## 2019-03-05 DIAGNOSIS — R29818 Other symptoms and signs involving the nervous system: Secondary | ICD-10-CM

## 2019-03-05 DIAGNOSIS — R262 Difficulty in walking, not elsewhere classified: Secondary | ICD-10-CM

## 2019-03-05 DIAGNOSIS — R2689 Other abnormalities of gait and mobility: Secondary | ICD-10-CM

## 2019-03-05 NOTE — Therapy (Signed)
[facility] [address] [place], [place], [address] Phone: [phone]   Fax:  [phone]  Physical Therapy Treatment  Patient Details  Name: Paula Sutton MRN: [id number] Date of Birth: 10-13-1955 Referring Provider (PT): Courtney Heys, MD   Encounter Date: 03/05/2019  PT End of Session - 03/05/19 1850    Visit Number  11    Number of Visits  28    Date for PT Re-Evaluation  04/26/19    Authorization Type  [employer], Medicare Secondary    PT Start Time  1230    PT Stop Time  1315    PT Time Calculation (min)  45 min    Equipment Utilized During Treatment  Gait belt    Activity Tolerance  Patient tolerated treatment well    Behavior During Therapy  [facility] for tasks assessed/performed       Past Medical History:  Diagnosis Date  . Arthritis    knee  . Dyslipidemia   . GERD (gastroesophageal reflux disease)   . Left anterior fascicular block    PATIENT SAYS THIS SHOWED ON EKG DURING COLONOSCOPY , WAS REFERRED TO CARDIOLOGY DR. Minus Breeding  (SEE EPIC ENCOUNTER 09-2017)       Past Surgical History:  Procedure Laterality Date  . ABDOMINAL HYSTERECTOMY    . BIOPSY  08/09/2017   Procedure: BIOPSY;  Surgeon: Danie Binder, MD;  [facility];  Service: Endoscopy;;  gastric biopsy for h pylori  . CARPAL TUNNEL RELEASE Right 12/07/2018  . CATARACT EXTRACTION, BILATERAL     DID 1 YEAR APART , LAST ONE WAS 12-2017  . COLONOSCOPY WITH ESOPHAGOGASTRODUODENOSCOPY (EGD)  2014   [place] PROPOFOL N/A 08/09/2017   3 simple adenomas, diverticulosis in recto-sigmoid, sigmoid, and descending colin, moderate external and internal hemorrhoids  . ESOPHAGOGASTRODUODENOSCOPY (EGD) WITH PROPOFOL N/A 08/09/2017   Low-grade narrowing Schatzki ring due to GERD; small hiatal hernia; mild gastritis and duodenitis due to NSAIDs; biopsy with gastritis  . KNEE ARTHROSCOPY Right 11/2017    WITH DR Wynelle Link  AT [place] Right   . POLYPECTOMY  08/09/2017   Procedure: POLYPECTOMY;  Surgeon: Danie Binder, MD;  [facility];  Service: Endoscopy;;  cecal polyp hs, transverse colon polyp hs, rectal polyp cs  . TOTAL KNEE REVISION Right 03/01/2018   Procedure: RIGHT TOTAL KNEE REVISION;  Surgeon: Gaynelle Arabian, MD;  [facility];  Service: Orthopedics;  Laterality: Right;  122min    There were no vitals filed for this visit.  Subjective Assessment - 03/05/19 1237    Subjective  No falls. Felt better over the weekend after doing her supine hip flexor stretch.    Pertinent History  GBS (diagnosed 9/20) history of OSA, GERD, recent CTR right hand    How long can you walk comfortably?  10-15 minutes or 260'    Diagnostic tests  MRI - had imaging done on brain last week, waiting for results -Had  a nerve conduction test last week: 50% on L, 25% on R    Patient Stated Goals  wants to walk completely without a RW    Currently in Pain?  No/denies    Pain Onset  More than a month ago                 03/05/19 0001  Self-Care  Self-Care Other Self-Care Comments  Other Self-Care Comments  Discussed  pt managing her fatigue at home while performing exercises due to pt's diagnosis, educated pt to not have exertion level go above 3-4/10. verbally reviewed exercises on pt's HEP and adjusted reps for proper form and fatigue level. Educated pt to make sure she is having proper rest breaks between each exercise and to break them up if needed. Pt verbalized understanding.   Knee/Hip Exercises: Seated  Long Arc Quad 5 reps;Weights;3 sets;Left;Strengthening (1 set no weight, 2 sets 1 lb )   Attempted quad sets on RLE supine on mat table, however pt unable to perform without compensation from other musculature.   Knee/Hip Exercises: Supine  Hip Adduction Isometric 3 sets;5 reps  Hip Adduction Isometric Limitations with pillow between knees   Bridges  3 sets;5 reps  Bridges Limitations Attempted 1 set with ball squeeze however pt unable to keep ball in for hip ADD  Other Supine Knee/Hip Exercises 3 x 5 reps B, hamstring curls in supine with heel on ball, therapist facilitation at ball                 Access Code: [email or web address]  URL: [email or web address]  Date: 03/05/2019  Prepared by: Janann August   Exercises Supine Heel Slides - 10 reps - 2 sets - 2x daily - 7x weekly Clamshell - 7 reps - 2 sets - 2x daily - 7x weekly Beginner Bridge - 4-5 reps - 2 sets - 2x daily - 7x weekly Hooklying Isometric Clamshell - 10 reps - 2 sets - 2x daily - 7x weekly Modified Thomas Stretch - 2 sets - 60 hold - 1x daily - 7x weekly Sit to Stand with Counter Support - 10 reps - 2 sets - 1x daily - 5x weekly Mini Squat with Counter Support - 7 reps - 2 sets - 1x daily - 5x weekly Seated Hamstring Stretch - 3 sets - 20-30 hold - 1-2x daily - 7x weekly Prone Quadriceps Stretch with Strap - 3 sets - 20-30 hold - 1-2x daily - 7x weekly     PT Education - 03/05/19 1244    Education Details  fatigue management    Person(s) Educated  Patient    Methods  Explanation    Comprehension  Verbalized understanding       PT Short Term Goals - 02/27/19 1224      PT SHORT TERM GOAL #1   Title  Patient will be independent with ongoing HEP in order to build upon functional gains in therapy. ALL STGS DUE 03/27/19    Time  4    Period  Weeks    Status  On-going    Target Date  03/27/19      PT SHORT TERM GOAL #2   Title  Patient will increase BERG score to at least 40/56 in order to demo decr fall risk.    Baseline  37/56 on 02/27/19, previously 30/56    Time  4    Period  Weeks    Status  Revised      PT SHORT TERM GOAL #3   Title  Patient will decrease 5 times sit <> stand score to at least 22 seconds with single UE support in order to improve functional LE strength.    Baseline  23.44 seconds with BUE support from standard arm  chair on 02/27/19    Time  4    Period  Weeks    Status  Revised      PT SHORT TERM GOAL #4   Title  Patient will ambulate at least 230' over outdoor surfaces with RW and supervision in order to improve endurance for gait and community mobility.    Time  4    Period  Weeks    Status  Revised      PT SHORT TERM GOAL #5   Title  Patient will increase gait speed to at least 2.3 ft/sec in order to decrease risk of falls.    Baseline  16.75 seconds = 1.96 ft/sec    Time  4    Period  Weeks    Status  Revised      Additional Short Term Goals   Additional Short Term Goals  Yes      PT SHORT TERM GOAL #6   Title  Patient will demonstrate proper technique to pick up an object from the floor at least 5 reps with supervision and no LOB.    Time  4    Period  Weeks    Status  New        PT Long Term Goals - 01/29/19 2151      PT LONG TERM GOAL #1   Title  Patient will be independent with final HEP in order to build upon functional gains in therapy. ALL STGS DUE 04/23/19    Time  12    Period  Weeks    Status  New    Target Date  04/23/19      PT LONG TERM GOAL #2   Title  Patient will increase baseline BERG score by at least 10 points in order to demo decreased fall risk.    Time  12    Period  Weeks    Status  New      PT LONG TERM GOAL #3   Title  Patient will ambulate at least 300' outdoors over unlevel surfaces with LRAD and supervision in order to improve community mobility.    Time  12    Period  Weeks    Status  New      PT LONG TERM GOAL #4   Title  Patient will perform TUG in 18 seconds or less with LRAD in order to demo decreased fall risk.    Time  12    Period  Weeks    Status  New      PT LONG TERM GOAL #5   Title  Patient will perform 5x sit <> stand in 18 seconds or less without UE support in order to demo improved LE strength.    Time  12    Period  Weeks    Status  New      Additional Long Term Goals   Additional Long Term Goals  Yes      PT LONG  TERM GOAL #6   Title  Patient will improve gait speed to at least 2.6 ft/sec using LRAD in order to demo decreased fall risk and improved community mobility.    Time  12    Period  Weeks    Status  New            Plan - 03/05/19 1857    Clinical Impression Statement  Focus of today's session was supine and seated LE strengthening and education on fatigue management when performing exercises at home and allowing frequent rest breaks. Rest breaks taken throughout session today after each set of LE strengthening. Pt with increased difficulty with performing hip ADD squeezes. Will continue to progress towards LTGs.  Personal Factors and Comorbidities  Comorbidity 2;Past/Current Experience    Comorbidities  OSA, GERD    Examination-Activity Limitations  Locomotion Level;Transfers;Stairs;Stand    Examination-Participation Restrictions  Community Activity    Stability/Clinical Decision Making  Evolving/Moderate complexity    Rehab Potential  Good    PT Frequency  3x / week   followed by 2x week for an additional 8 weeks   PT Duration  4 weeks   followed by 2x week for an additional 8 weeks   PT Treatment/Interventions  ADLs/Self Care Home Management;Electrical Stimulation;Aquatic Therapy;Gait training;DME Instruction;Stair training;Functional mobility training;Therapeutic activities;Therapeutic exercise;Patient/family education;Neuromuscular re-education;Balance training;Passive range of motion    PT Next Visit Plan  powder board hip flexion. gait outdoors when weather permits/curb training. practice picking up objects from higher surfaces/ proper technique.  NuStep/SciFit for LE strengthening and endurance. gait with RW, continue sit <> stand training, standing balance activites/dynamic gait in // bars. try balance on compliant surfaces in // bars.    PT Home Exercise Plan  [email or web address]    Consulted and Agree with Plan of Care  Patient;Family member/caregiver    Family Member Consulted  pt's  husband [name]       Patient will benefit from skilled therapeutic intervention in order to improve the following deficits and impairments:  Abnormal gait, Decreased activity tolerance, Decreased coordination, Decreased balance, Decreased endurance, Decreased strength, Difficulty walking, Decreased mobility  Visit Diagnosis: Difficulty in walking, not elsewhere classified  Muscle weakness (generalized)  Other abnormalities of gait and mobility  Other symptoms and signs involving the nervous system     Problem List Patient Active Problem List   Diagnosis Date Noted  . Guillain Barr syndrome ([place]) 01/19/2019  . Cervical myofascial pain syndrome 12/16/2018  . Carpal tunnel syndrome 12/16/2018  . Axonal GBS (Guillain-Barre syndrome) ([place]) 12/15/2018  . Leg weakness, bilateral 12/08/2018  . IBS (irritable bowel syndrome) 12/08/2018  . GERD (gastroesophageal reflux disease) 09/14/2018  . 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 Abdominal pain, epigastric 06/03/2017  . Constipation 01/27/2016  . History of adenomatous polyp of colon 01/27/2016    Arliss Journey, PT, DPT 03/05/2019, 6:59 PM  [facility] [address] [place], [place], [address] Phone: [phone]   Fax:  [phone]  Name: NALAYA PETTAWAY MRN: [id number] Date of Birth: 1955/05/30

## 2019-03-05 NOTE — Patient Instructions (Signed)
Access Code: [email or web address]  URL: [email or web address]  Date: 03/05/2019  Prepared by: Janann August   Exercises Supine Heel Slides - 10 reps - 2 sets - 2x daily - 7x weekly Clamshell - 7 reps - 2 sets - 2x daily - 7x weekly Beginner Bridge - 4-5 reps - 2 sets - 2x daily - 7x weekly Hooklying Isometric Clamshell - 10 reps - 2 sets - 2x daily - 7x weekly Modified Thomas Stretch - 2 sets - 60 hold - 1x daily - 7x weekly Sit to Stand with Counter Support - 10 reps - 2 sets - 1x daily - 5x weekly Mini Squat with Counter Support - 7 reps - 2 sets - 1x daily - 5x weekly Seated Hamstring Stretch - 3 sets - 20-30 hold - 1-2x daily - 7x weekly Prone Quadriceps Stretch with Strap - 3 sets - 20-30 hold - 1-2x daily - 7x weekly

## 2019-03-06 ENCOUNTER — Encounter: Payer: Self-pay | Admitting: Physical Therapy

## 2019-03-06 ENCOUNTER — Other Ambulatory Visit: Payer: Self-pay

## 2019-03-06 ENCOUNTER — Ambulatory Visit: Payer: 59 | Admitting: Physical Therapy

## 2019-03-06 DIAGNOSIS — M6281 Muscle weakness (generalized): Secondary | ICD-10-CM | POA: Diagnosis not present

## 2019-03-06 DIAGNOSIS — R262 Difficulty in walking, not elsewhere classified: Secondary | ICD-10-CM

## 2019-03-06 DIAGNOSIS — R2689 Other abnormalities of gait and mobility: Secondary | ICD-10-CM

## 2019-03-06 NOTE — Therapy (Addendum)
[facility] [address] [place], [place], [address] Phone: [phone]   Fax:  [phone]  Physical Therapy Treatment  Patient Details  Name: Paula Sutton MRN: [id number] Date of Birth: 11/04/1955 Referring Provider (PT): Courtney Heys, MD   Encounter Date: 03/06/2019  PT End of Session - 03/06/19 1458    Visit Number  12    Number of Visits  28    Date for PT Re-Evaluation  04/26/19    Authorization Type  [employer], Medicare Secondary    PT Start Time  1230    PT Stop Time  1313    PT Time Calculation (min)  43 min    Equipment Utilized During Treatment  Gait belt    Activity Tolerance  Patient tolerated treatment well    Behavior During Therapy  [facility] for tasks assessed/performed       Past Medical History:  Diagnosis Date  . Arthritis    knee  . Dyslipidemia   . GERD (gastroesophageal reflux disease)   . Left anterior fascicular block    PATIENT SAYS THIS SHOWED ON EKG DURING COLONOSCOPY , WAS REFERRED TO CARDIOLOGY DR. Minus Breeding  (SEE EPIC ENCOUNTER 09-2017)       Past Surgical History:  Procedure Laterality Date  . ABDOMINAL HYSTERECTOMY    . BIOPSY  08/09/2017   Procedure: BIOPSY;  Surgeon: Danie Binder, MD;  [facility];  Service: Endoscopy;;  gastric biopsy for h pylori  . CARPAL TUNNEL RELEASE Right 12/07/2018  . CATARACT EXTRACTION, BILATERAL     DID 1 YEAR APART , LAST ONE WAS 12-2017  . COLONOSCOPY WITH ESOPHAGOGASTRODUODENOSCOPY (EGD)  2014   [place] PROPOFOL N/A 08/09/2017   3 simple adenomas, diverticulosis in recto-sigmoid, sigmoid, and descending colin, moderate external and internal hemorrhoids  . ESOPHAGOGASTRODUODENOSCOPY (EGD) WITH PROPOFOL N/A 08/09/2017   Low-grade narrowing Schatzki ring due to GERD; small hiatal hernia; mild gastritis and duodenitis due to NSAIDs; biopsy with gastritis  . KNEE ARTHROSCOPY Right 11/2017    WITH DR Wynelle Link  AT [place] Right   . POLYPECTOMY  08/09/2017   Procedure: POLYPECTOMY;  Surgeon: Danie Binder, MD;  [facility];  Service: Endoscopy;;  cecal polyp hs, transverse colon polyp hs, rectal polyp cs  . TOTAL KNEE REVISION Right 03/01/2018   Procedure: RIGHT TOTAL KNEE REVISION;  Surgeon: Gaynelle Arabian, MD;  [facility]: [facility];  Service: Orthopedics;  Laterality: Right;  193min    There were no vitals filed for this visit.  Subjective Assessment - 03/06/19 1232    Subjective  No falls. Felt good after yesterday. Was able to stand at the countertop yesterday for 15 minutes to bathe her dog - felt balanced and not very tired afterwards.    Pertinent History  GBS (diagnosed 9/20) history of OSA, GERD, recent CTR right hand    How long can you walk comfortably?  10-15 minutes or 260'    Diagnostic tests  MRI - had imaging done on brain last week, waiting for results -Had  a nerve conduction test last week: 50% on L, 25% on R    Patient Stated Goals  wants to walk completely without a RW    Currently in Pain?  No/denies    Pain Onset  More than a month ago  03/06/19 0001  Ambulation/Gait  Ambulation/Gait Yes  Ambulation/Gait Assistance 5: Supervision  Ambulation/Gait Assistance Details throughout session with RW  Ambulation Distance (Feet) 115 Feet  Assistive device Rolling walker  Gait Pattern Step-through pattern;Decreased weight shift to right;Trunk flexed;Poor foot clearance - right;Decreased hip/knee flexion - right;Decreased hip/knee flexion - left;Decreased dorsiflexion - right;Decreased dorsiflexion - left;Lateral trunk lean to right  Ambulation Surface Level;Indoor  Exercises  Other Exercises  NuStep with BLE and BUE at gear 3 for endurance, strengthening, and ROM.      Balance Exercises - 03/06/19 1254      Balance Exercises: Standing   Retro Gait  2 reps;Other (comment)   in //  bars with B fingertip support, cues for step length   Sidestepping  --    Other Standing Exercises  On blue foam with wider BOS: eyes closed 3 x 30 seconds, head turns 2 x 10 reps, head nods 2 x 10 reps.  Standing with wide BOS on foam - reaching laterally/superiorly on both sides to tap red cone, 2 x 5 reps    Other Standing Exercises Comments  Forward walking in // bars no UE support, min guard  Down and back 2 reps          PT Short Term Goals - 02/27/19 1224      PT SHORT TERM GOAL #1   Title  Patient will be independent with ongoing HEP in order to build upon functional gains in therapy. ALL STGS DUE 03/27/19    Time  4    Period  Weeks    Status  On-going    Target Date  03/27/19      PT SHORT TERM GOAL #2   Title  Patient will increase BERG score to at least 40/56 in order to demo decr fall risk.    Baseline  37/56 on 02/27/19, previously 30/56    Time  4    Period  Weeks    Status  Revised      PT SHORT TERM GOAL #3   Title  Patient will decrease 5 times sit <> stand score to at least 22 seconds with single UE support in order to improve functional LE strength.    Baseline  23.44 seconds with BUE support from standard arm chair on 02/27/19    Time  4    Period  Weeks    Status  Revised      PT SHORT TERM GOAL #4   Title  Patient will ambulate at least 230' over outdoor surfaces with RW and supervision in order to improve endurance for gait and community mobility.    Time  4    Period  Weeks    Status  Revised      PT SHORT TERM GOAL #5   Title  Patient will increase gait speed to at least 2.3 ft/sec in order to decrease risk of falls.    Baseline  16.75 seconds = 1.96 ft/sec    Time  4    Period  Weeks    Status  Revised      Additional Short Term Goals   Additional Short Term Goals  Yes      PT SHORT TERM GOAL #6   Title  Patient will demonstrate proper technique to pick up an object from the floor at least 5 reps with supervision and no LOB.    Time  4     Period  Weeks    Status  New  PT Long Term Goals - 01/29/19 2151      PT LONG TERM GOAL #1   Title  Patient will be independent with final HEP in order to build upon functional gains in therapy. ALL STGS DUE 04/23/19    Time  12    Period  Weeks    Status  New    Target Date  04/23/19      PT LONG TERM GOAL #2   Title  Patient will increase baseline BERG score by at least 10 points in order to demo decreased fall risk.    Time  12    Period  Weeks    Status  New      PT LONG TERM GOAL #3   Title  Patient will ambulate at least 300' outdoors over unlevel surfaces with LRAD and supervision in order to improve community mobility.    Time  12    Period  Weeks    Status  New      PT LONG TERM GOAL #4   Title  Patient will perform TUG in 18 seconds or less with LRAD in order to demo decreased fall risk.    Time  12    Period  Weeks    Status  New      PT LONG TERM GOAL #5   Title  Patient will perform 5x sit <> stand in 18 seconds or less without UE support in order to demo improved LE strength.    Time  12    Period  Weeks    Status  New      Additional Long Term Goals   Additional Long Term Goals  Yes      PT LONG TERM GOAL #6   Title  Patient will improve gait speed to at least 2.6 ft/sec using LRAD in order to demo decreased fall risk and improved community mobility.    Time  12    Period  Weeks    Status  New            Plan - 03/06/19 1501    Clinical Impression Statement  Focus of today's session was LE strengthening, standing dynamic and static balance. Pt tolerated balance on foam well, able to progress to eyes closed with head nods and turns. Pt able to demonstrate forward walking in // bars with no UE support and retro walking with light fingertip assist. Rest breaks taken throughout session due to limit fatigue. Will continue to progress towards LTGs.    Personal Factors and Comorbidities  Comorbidity 2;Past/Current Experience    Comorbidities  OSA,  GERD    Examination-Activity Limitations  Locomotion Level;Transfers;Stairs;Stand    Examination-Participation Restrictions  Community Activity    Stability/Clinical Decision Making  Evolving/Moderate complexity    Rehab Potential  Good    PT Frequency  3x / week   followed by 2x week for an additional 8 weeks   PT Duration  4 weeks   followed by 2x week for an additional 8 weeks   PT Treatment/Interventions  ADLs/Self Care Home Management;Electrical Stimulation;Aquatic Therapy;Gait training;DME Instruction;Stair training;Functional mobility training;Therapeutic activities;Therapeutic exercise;Patient/family education;Neuromuscular re-education;Balance training;Passive range of motion    PT Next Visit Plan  try gait training with smal base quad cane, as pt currently uses RW. powder board hip flexion.practice picking up objects from higher surfaces/ proper technique.  NuStep/SciFit for LE strengthening and endurance. gait with RW, continue sit <> stand training, standing balance activites/dynamic gait in // bars. continue balance  on foam. monitor fatigue.    PT Home Exercise Plan  [id number]    Consulted and Agree with Plan of Care  Patient;Family member/caregiver    Family Member Consulted  pt's husband [name]       Patient will benefit from skilled therapeutic intervention in order to improve the following deficits and impairments:  Abnormal gait, Decreased activity tolerance, Decreased coordination, Decreased balance, Decreased endurance, Decreased strength, Difficulty walking, Decreased mobility  Visit Diagnosis: Difficulty in walking, not elsewhere classified  Other abnormalities of gait and mobility  Muscle weakness (generalized)     Problem List Patient Active Problem List   Diagnosis Date Noted  . Guillain Barr syndrome ([place]) 01/19/2019  . Cervical myofascial pain syndrome 12/16/2018  . Carpal tunnel syndrome 12/16/2018  . Axonal GBS (Guillain-Barre syndrome) ([place])  12/15/2018  . Leg weakness, bilateral 12/08/2018  . IBS (irritable bowel syndrome) 12/08/2018  . GERD (gastroesophageal reflux disease) 09/14/2018  . 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 Abdominal pain, epigastric 06/03/2017  . Constipation 01/27/2016  . History of adenomatous polyp of colon 01/27/2016    Arliss Journey, PT, DPT 03/06/2019, 3:08 PM  [facility] [address] [place], [place], [address] Phone: [phone]   Fax:  [phone]  Name: JADALYNN BIXEL MRN: [id number] Date of Birth: November 27, 1955

## 2019-03-07 ENCOUNTER — Other Ambulatory Visit: Payer: Self-pay

## 2019-03-07 ENCOUNTER — Ambulatory Visit: Payer: 59

## 2019-03-07 DIAGNOSIS — R2689 Other abnormalities of gait and mobility: Secondary | ICD-10-CM

## 2019-03-07 DIAGNOSIS — M6281 Muscle weakness (generalized): Secondary | ICD-10-CM

## 2019-03-07 NOTE — Therapy (Signed)
[facility] [address] [place], [place], [address] Phone: [phone]   Fax:  [phone]  Physical Therapy Treatment  Patient Details  Name: Paula Sutton MRN: [id number] Date of Birth: Mar 02, 1956 Referring Provider (PT): Courtney Heys, MD   Encounter Date: 03/07/2019  PT End of Session - 03/07/19 1104    Visit Number  13    Number of Visits  28    Date for PT Re-Evaluation  04/26/19    Authorization Type  [employer], Medicare Secondary    PT Start Time  1102    PT Stop Time  1143    PT Time Calculation (min)  41 min    Equipment Utilized During Treatment  Gait belt    Activity Tolerance  Patient tolerated treatment well    Behavior During Therapy  [facility] for tasks assessed/performed       Past Medical History:  Diagnosis Date  . Arthritis    knee  . Dyslipidemia   . GERD (gastroesophageal reflux disease)   . Left anterior fascicular block    PATIENT SAYS THIS SHOWED ON EKG DURING COLONOSCOPY , WAS REFERRED TO CARDIOLOGY DR. Minus Breeding  (SEE EPIC ENCOUNTER 09-2017)       Past Surgical History:  Procedure Laterality Date  . ABDOMINAL HYSTERECTOMY    . BIOPSY  08/09/2017   Procedure: BIOPSY;  Surgeon: Danie Binder, MD;  [facility];  Service: Endoscopy;;  gastric biopsy for h pylori  . CARPAL TUNNEL RELEASE Right 12/07/2018  . CATARACT EXTRACTION, BILATERAL     DID 1 YEAR APART , LAST ONE WAS 12-2017  . COLONOSCOPY WITH ESOPHAGOGASTRODUODENOSCOPY (EGD)  2014   [place] PROPOFOL N/A 08/09/2017   3 simple adenomas, diverticulosis in recto-sigmoid, sigmoid, and descending colin, moderate external and internal hemorrhoids  . ESOPHAGOGASTRODUODENOSCOPY (EGD) WITH PROPOFOL N/A 08/09/2017   Low-grade narrowing Schatzki ring due to GERD; small hiatal hernia; mild gastritis and duodenitis due to NSAIDs; biopsy with gastritis  . KNEE ARTHROSCOPY Right 11/2017    WITH DR Wynelle Link  AT [place] Right   . POLYPECTOMY  08/09/2017   Procedure: POLYPECTOMY;  Surgeon: Danie Binder, MD;  [facility];  Service: Endoscopy;;  cecal polyp hs, transverse colon polyp hs, rectal polyp cs  . TOTAL KNEE REVISION Right 03/01/2018   Procedure: RIGHT TOTAL KNEE REVISION;  Surgeon: Gaynelle Arabian, MD;  [facility];  Service: Orthopedics;  Laterality: Right;  199min    There were no vitals filed for this visit.  Subjective Assessment - 03/07/19 1104    Subjective  Pt reports that she felt better after last session. Was not as sore with breaking up activities.    Pertinent History  GBS (diagnosed 9/20) history of OSA, GERD, recent CTR right hand    How long can you walk comfortably?  10-15 minutes or 260'    Diagnostic tests  MRI - had imaging done on brain last week, waiting for results -Had  a nerve conduction test last week: 50% on L, 25% on R    Patient Stated Goals  wants to walk completely without a RW    Currently in Pain?  No/denies    Pain Onset  More than a month ago                       [facility] Adult PT Treatment/Exercise - 03/07/19  [place] to Stand;Stand to Sit    Sit to Stand  6: Modified independent (Device/Increase time)    Stand to Sit  6: Modified independent (Device/Increase time)      Ambulation/Gait   Ambulation/Gait  Yes    Ambulation/Gait Assistance  6: Modified independent (Device/Increase time)    Ambulation/Gait Assistance Details  Verbal cues for sequencing with quad cane.    Ambulation Distance (Feet)  345 Feet   230' with small based quad cane CGA    Assistive device  Rolling walker    Gait Pattern  Step-through pattern    Ambulation Surface  Level;Indoor      Neuro Re-ed    Neuro Re-ed Details   In // bars: standing on airex with eyes closed x 30 sec then with head turns left/right and up/down x 10. Pt had slight increased sway with eyes closed.  Marching on airex x 10 bilateral with light UE support ( pt has decreased right hip flexion and right pelvic drop with stance on left), staggered stance on airex 30 sec x 2 each position. Gait in // bars side stepping 6' x 4 then marching fowards and backwards steps 6' x 4 then tandem gait 6' x 2.              PT Education - 03/07/19 1638    Education Details  Pt to continue with current HEP.    Person(s) Educated  Patient    Methods  Explanation    Comprehension  Verbalized understanding       PT Short Term Goals - 02/27/19 1224      PT SHORT TERM GOAL #1   Title  Patient will be independent with ongoing HEP in order to build upon functional gains in therapy. ALL STGS DUE 03/27/19    Time  4    Period  Weeks    Status  On-going    Target Date  03/27/19      PT SHORT TERM GOAL #2   Title  Patient will increase BERG score to at least 40/56 in order to demo decr fall risk.    Baseline  37/56 on 02/27/19, previously 30/56    Time  4    Period  Weeks    Status  Revised      PT SHORT TERM GOAL #3   Title  Patient will decrease 5 times sit <> stand score to at least 22 seconds with single UE support in order to improve functional LE strength.    Baseline  23.44 seconds with BUE support from standard arm chair on 02/27/19    Time  4    Period  Weeks    Status  Revised      PT SHORT TERM GOAL #4   Title  Patient will ambulate at least 230' over outdoor surfaces with RW and supervision in order to improve endurance for gait and community mobility.    Time  4    Period  Weeks    Status  Revised      PT SHORT TERM GOAL #5   Title  Patient will increase gait speed to at least 2.3 ft/sec in order to decrease risk of falls.    Baseline  16.75 seconds = 1.96 ft/sec    Time  4    Period  Weeks    Status  Revised      Additional Short Term Goals   Additional  Short Term Goals  Yes      PT SHORT TERM GOAL #6   Title  Patient will demonstrate proper technique to pick up an object  from the floor at least 5 reps with supervision and no LOB.    Time  4    Period  Weeks    Status  New        PT Long Term Goals - 01/29/19 2151      PT LONG TERM GOAL #1   Title  Patient will be independent with final HEP in order to build upon functional gains in therapy. ALL STGS DUE 04/23/19    Time  12    Period  Weeks    Status  New    Target Date  04/23/19      PT LONG TERM GOAL #2   Title  Patient will increase baseline BERG score by at least 10 points in order to demo decreased fall risk.    Time  12    Period  Weeks    Status  New      PT LONG TERM GOAL #3   Title  Patient will ambulate at least 300' outdoors over unlevel surfaces with LRAD and supervision in order to improve community mobility.    Time  12    Period  Weeks    Status  New      PT LONG TERM GOAL #4   Title  Patient will perform TUG in 18 seconds or less with LRAD in order to demo decreased fall risk.    Time  12    Period  Weeks    Status  New      PT LONG TERM GOAL #5   Title  Patient will perform 5x sit <> stand in 18 seconds or less without UE support in order to demo improved LE strength.    Time  12    Period  Weeks    Status  New      Additional Long Term Goals   Additional Long Term Goals  Yes      PT LONG TERM GOAL #6   Title  Patient will improve gait speed to at least 2.6 ft/sec using LRAD in order to demo decreased fall risk and improved community mobility.    Time  12    Period  Weeks    Status  New            Plan - 03/07/19 1639    Clinical Impression Statement  Pt was able to  progress gait with small based quad cane. CGA for safety but able to utilize step-through/reciprocal gait pattern. Pt was given rest breaks throughout session to break up activities.    Personal Factors and Comorbidities  Comorbidity 2;Past/Current Experience    Comorbidities  OSA, GERD    Examination-Activity Limitations  Locomotion Level;Transfers;Stairs;Stand    Examination-Participation  Restrictions  Community Activity    Stability/Clinical Decision Making  Evolving/Moderate complexity    Rehab Potential  Good    PT Frequency  3x / week   followed by 2x week for an additional 8 weeks   PT Duration  4 weeks   followed by 2x week for an additional 8 weeks   PT Treatment/Interventions  ADLs/Self Care Home Management;Electrical Stimulation;Aquatic Therapy;Gait training;DME Instruction;Stair training;Functional mobility training;Therapeutic activities;Therapeutic exercise;Patient/family education;Neuromuscular re-education;Balance training;Passive range of motion    PT Next Visit Plan  Continue gait with small based quad cane. powder board hip flexion.practice picking up  objects from higher surfaces/ proper technique.  NuStep/SciFit for LE strengthening and endurance. gait with RW, continue sit <> stand training, standing balance activites/dynamic gait in // bars. continue balance on foam. monitor fatigue.    PT Home Exercise Plan  [id number]    Consulted and Agree with Plan of Care  Patient;Family member/caregiver       Patient will benefit from skilled therapeutic intervention in order to improve the following deficits and impairments:  Abnormal gait, Decreased activity tolerance, Decreased coordination, Decreased balance, Decreased endurance, Decreased strength, Difficulty walking, Decreased mobility  Visit Diagnosis: Other abnormalities of gait and mobility  Muscle weakness (generalized)     Problem List Patient Active Problem List   Diagnosis Date Noted  . Guillain Barr syndrome ([place]) 01/19/2019  . Cervical myofascial pain syndrome 12/16/2018  . Carpal tunnel syndrome 12/16/2018  . Axonal GBS (Guillain-Barre syndrome) ([place]) 12/15/2018  . Leg weakness, bilateral 12/08/2018  . IBS (irritable bowel syndrome) 12/08/2018  . GERD (gastroesophageal reflux disease) 09/14/2018  . 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 Abdominal pain, epigastric 06/03/2017  .  Constipation 01/27/2016  . History of adenomatous polyp of colon 01/27/2016    Electa Sniff, PT, DPT, NCS 03/07/2019, 4:41 PM  [facility] [address] [place], [place], [address] Phone: [phone]   Fax:  [phone]  Name: Paula Sutton MRN: [id number] Date of Birth: [birth year]

## 2019-03-12 ENCOUNTER — Other Ambulatory Visit: Payer: Self-pay

## 2019-03-12 ENCOUNTER — Ambulatory Visit: Payer: 59

## 2019-03-12 DIAGNOSIS — M6281 Muscle weakness (generalized): Secondary | ICD-10-CM | POA: Diagnosis not present

## 2019-03-12 DIAGNOSIS — R2689 Other abnormalities of gait and mobility: Secondary | ICD-10-CM

## 2019-03-12 NOTE — Therapy (Signed)
[facility] [address] [place], [place], [address] Phone: [phone]   Fax:  [phone]  Physical Therapy Treatment  Patient Details  Name: Paula Sutton MRN: [id number] Date of Birth: 04/23/1955 Referring Provider (PT): Courtney Heys, MD   Encounter Date: 03/12/2019  PT End of Session - 03/12/19 1320    Visit Number  14    Number of Visits  28    Date for PT Re-Evaluation  04/26/19    Authorization Type  [employer], Medicare Secondary    PT Start Time  1318    PT Stop Time  1356    PT Time Calculation (min)  38 min    Equipment Utilized During Treatment  Gait belt    Activity Tolerance  Patient tolerated treatment well    Behavior During Therapy  WFL for tasks assessed/performed       Past Medical History:  Diagnosis Date  . Arthritis    knee  . Dyslipidemia   . GERD (gastroesophageal reflux disease)   . Left anterior fascicular block    PATIENT SAYS THIS SHOWED ON EKG DURING COLONOSCOPY , WAS REFERRED TO CARDIOLOGY DR. Minus Breeding  (SEE EPIC ENCOUNTER 09-2017)       Past Surgical History:  Procedure Laterality Date  . ABDOMINAL HYSTERECTOMY    . BIOPSY  08/09/2017   Procedure: BIOPSY;  Surgeon: Danie Binder, MD;  [facility];  Service: Endoscopy;;  gastric biopsy for h pylori  . CARPAL TUNNEL RELEASE Right 12/07/2018  . CATARACT EXTRACTION, BILATERAL     DID 1 YEAR APART , LAST ONE WAS 12-2017  . COLONOSCOPY WITH ESOPHAGOGASTRODUODENOSCOPY (EGD)  2014   [place] PROPOFOL N/A 08/09/2017   3 simple adenomas, diverticulosis in recto-sigmoid, sigmoid, and descending colin, moderate external and internal hemorrhoids  . ESOPHAGOGASTRODUODENOSCOPY (EGD) WITH PROPOFOL N/A 08/09/2017   Low-grade narrowing Schatzki ring due to GERD; small hiatal hernia; mild gastritis and duodenitis due to NSAIDs; biopsy with gastritis  . KNEE ARTHROSCOPY Right 11/2017    WITH DR Wynelle Link  AT [place] Right   . POLYPECTOMY  08/09/2017   Procedure: POLYPECTOMY;  Surgeon: Danie Binder, MD;  [facility];  Service: Endoscopy;;  cecal polyp hs, transverse colon polyp hs, rectal polyp cs  . TOTAL KNEE REVISION Right 03/01/2018   Procedure: RIGHT TOTAL KNEE REVISION;  Surgeon: Gaynelle Arabian, MD;  [facility]: [facility];  Service: Orthopedics;  Laterality: Right;  156min    There were no vitals filed for this visit.  Subjective Assessment - 03/12/19 1320    Subjective  Pt reports that she is doing pretty good. Did some walking this weekend in home with nothing. She did get small based quad cane, however.    Pertinent History  GBS (diagnosed 9/20) history of OSA, GERD, recent CTR right hand    How long can you walk comfortably?  10-15 minutes or 260'    Diagnostic tests  MRI - had imaging done on brain last week, waiting for results -Had  a nerve conduction test last week: 50% on L, 25% on R    Patient Stated Goals  wants to walk completely without a RW    Currently in Pain?  Yes    Pain Location  Leg   thigh   Pain Orientation  Left    Pain Descriptors / Indicators  --   nervy, touchy  Pain Type  Neuropathic pain    Pain Onset  More than a month ago                       [facility] Adult PT Treatment/Exercise - 03/12/19 1329      Transfers   Transfers  Sit to Stand;Stand to Sit    Sit to Stand  6: Modified independent (Device/Increase time)    Stand to Sit  6: Modified independent (Device/Increase time)      Ambulation/Gait   Ambulation/Gait  Yes    Ambulation/Gait Assistance  5: Supervision    Ambulation Distance (Feet)  115 Feet    Assistive device  Small based quad cane    Gait Pattern  Step-through pattern    Ambulation Surface  Level;Indoor    Gait Comments  Pt ambulated another 115' without AD CGA with increased lateral sway and decreased right stance time. Pt ambulated 115' with SPC with reciprocal  pattern with improved left step length and right stance time close supervision/CGA      Neuro Re-ed    Neuro Re-ed Details   In // bars: marching with light UE support 6' x 6, side stepping 6' x 6, tandem gait 6' x 4. Supervision with activities.      Exercises   Exercises  Other Exercises;Knee/Hip      Knee/Hip Exercises: Aerobic   Nustep  level 5 x 8 min with feet strapped in. Pt was cued to keep hips in neutral. Pt reported 2/10 RPE after.             PT Education - 03/12/19 1604    Education Details  Pt to continue with current HEP    Person(s) Educated  Patient    Methods  Explanation    Comprehension  Verbalized understanding       PT Short Term Goals - 02/27/19 1224      PT SHORT TERM GOAL #1   Title  Patient will be independent with ongoing HEP in order to build upon functional gains in therapy. ALL STGS DUE 03/27/19    Time  4    Period  Weeks    Status  On-going    Target Date  03/27/19      PT SHORT TERM GOAL #2   Title  Patient will increase BERG score to at least 40/56 in order to demo decr fall risk.    Baseline  37/56 on 02/27/19, previously 30/56    Time  4    Period  Weeks    Status  Revised      PT SHORT TERM GOAL #3   Title  Patient will decrease 5 times sit <> stand score to at least 22 seconds with single UE support in order to improve functional LE strength.    Baseline  23.44 seconds with BUE support from standard arm chair on 02/27/19    Time  4    Period  Weeks    Status  Revised      PT SHORT TERM GOAL #4   Title  Patient will ambulate at least 230' over outdoor surfaces with RW and supervision in order to improve endurance for gait and community mobility.    Time  4    Period  Weeks    Status  Revised      PT SHORT TERM GOAL #5   Title  Patient will increase gait speed to at least 2.3 ft/sec in order to decrease risk  of falls.    Baseline  16.75 seconds = 1.96 ft/sec    Time  4    Period  Weeks    Status  Revised      Additional  Short Term Goals   Additional Short Term Goals  Yes      PT SHORT TERM GOAL #6   Title  Patient will demonstrate proper technique to pick up an object from the floor at least 5 reps with supervision and no LOB.    Time  4    Period  Weeks    Status  New        PT Long Term Goals - 01/29/19 2151      PT LONG TERM GOAL #1   Title  Patient will be independent with final HEP in order to build upon functional gains in therapy. ALL STGS DUE 04/23/19    Time  12    Period  Weeks    Status  New    Target Date  04/23/19      PT LONG TERM GOAL #2   Title  Patient will increase baseline BERG score by at least 10 points in order to demo decreased fall risk.    Time  12    Period  Weeks    Status  New      PT LONG TERM GOAL #3   Title  Patient will ambulate at least 300' outdoors over unlevel surfaces with LRAD and supervision in order to improve community mobility.    Time  12    Period  Weeks    Status  New      PT LONG TERM GOAL #4   Title  Patient will perform TUG in 18 seconds or less with LRAD in order to demo decreased fall risk.    Time  12    Period  Weeks    Status  New      PT LONG TERM GOAL #5   Title  Patient will perform 5x sit <> stand in 18 seconds or less without UE support in order to demo improved LE strength.    Time  12    Period  Weeks    Status  New      Additional Long Term Goals   Additional Long Term Goals  Yes      PT LONG TERM GOAL #6   Title  Patient will improve gait speed to at least 2.6 ft/sec using LRAD in order to demo decreased fall risk and improved community mobility.    Time  12    Period  Weeks    Status  New            Plan - 03/12/19 1604    Clinical Impression Statement  PT assessed gait with quad cane, SPC and without AD. Pt best suited for gait with SPC while in therapy at this times. Explained to patient how no AD was affecting gait quality and decreasing safety. Pt to continue to use walker outside of therapy until cleared  by PT. Pt was able to show improving activity tolerance with inceased time on the NuStep today.    Personal Factors and Comorbidities  Comorbidity 2;Past/Current Experience    Comorbidities  OSA, GERD    Examination-Activity Limitations  Locomotion Level;Transfers;Stairs;Stand    Examination-Participation Restrictions  Community Activity    Stability/Clinical Decision Making  Evolving/Moderate complexity    Rehab Potential  Good    PT Frequency  3x / week  followed by 2x week for an additional 8 weeks   PT Duration  4 weeks   followed by 2x week for an additional 8 weeks   PT Treatment/Interventions  ADLs/Self Care Home Management;Electrical Stimulation;Aquatic Therapy;Gait training;DME Instruction;Stair training;Functional mobility training;Therapeutic activities;Therapeutic exercise;Patient/family education;Neuromuscular re-education;Balance training;Passive range of motion    PT Next Visit Plan  Continue gait with SPC.    NuStep/SciFit for LE strengthening and endurance. gait with RW, continue sit <> stand training, standing balance activites/dynamic gait in // bars. continue balance on foam. monitor fatigue.    PT Home Exercise Plan  [id number]    Consulted and Agree with Plan of Care  Patient;Family member/caregiver       Patient will benefit from skilled therapeutic intervention in order to improve the following deficits and impairments:  Abnormal gait, Decreased activity tolerance, Decreased coordination, Decreased balance, Decreased endurance, Decreased strength, Difficulty walking, Decreased mobility  Visit Diagnosis: Other abnormalities of gait and mobility  Muscle weakness (generalized)     Problem List Patient Active Problem List   Diagnosis Date Noted  . Guillain Barr syndrome ([place]) 01/19/2019  . Cervical myofascial pain syndrome 12/16/2018  . Carpal tunnel syndrome 12/16/2018  . 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 Leg weakness, bilateral  12/08/2018  . IBS (irritable bowel syndrome) 12/08/2018  . GERD (gastroesophageal reflux disease) 09/14/2018  . 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 Abdominal pain, epigastric 06/03/2017  . Constipation 01/27/2016  . History of adenomatous polyp of colon 01/27/2016    Electa Sniff, PT, DPT, NCS 03/12/2019, 4:07 PM  [facility] [address] [place], [place], [address] Phone: [phone]   Fax:  [phone]  Name: Paula Sutton MRN: [id number] Date of Birth: 09/28/1955

## 2019-03-15 ENCOUNTER — Other Ambulatory Visit: Payer: Self-pay

## 2019-03-15 ENCOUNTER — Encounter: Payer: Self-pay | Admitting: Physical Therapy

## 2019-03-15 ENCOUNTER — Ambulatory Visit: Payer: 59 | Admitting: Physical Therapy

## 2019-03-15 DIAGNOSIS — M6281 Muscle weakness (generalized): Secondary | ICD-10-CM | POA: Diagnosis not present

## 2019-03-15 DIAGNOSIS — R2689 Other abnormalities of gait and mobility: Secondary | ICD-10-CM

## 2019-03-15 DIAGNOSIS — R262 Difficulty in walking, not elsewhere classified: Secondary | ICD-10-CM

## 2019-03-15 NOTE — Therapy (Signed)
[facility] [address] [place], [place], [address] Phone: [phone]   Fax:  [phone]  Physical Therapy Treatment  Patient Details  Name: Paula Sutton MRN: [id number] Date of Birth: 09-May-1955 Referring Provider (PT): Courtney Heys, MD   Encounter Date: 03/15/2019  PT End of Session - 03/15/19 1105    Visit Number  15    Number of Visits  28    Date for PT Re-Evaluation  04/26/19    Authorization Type  [employer], Medicare Secondary    PT Start Time  1016    PT Stop Time  1058    PT Time Calculation (min)  42 min    Equipment Utilized During Treatment  Gait belt    Activity Tolerance  Patient tolerated treatment well    Behavior During Therapy  WFL for tasks assessed/performed       Past Medical History:  Diagnosis Date  . Arthritis    knee  . Dyslipidemia   . GERD (gastroesophageal reflux disease)   . Left anterior fascicular block    PATIENT SAYS THIS SHOWED ON EKG DURING COLONOSCOPY , WAS REFERRED TO CARDIOLOGY DR. Minus Breeding  (SEE EPIC ENCOUNTER 09-2017)       Past Surgical History:  Procedure Laterality Date  . ABDOMINAL HYSTERECTOMY    . BIOPSY  08/09/2017   Procedure: BIOPSY;  Surgeon: Danie Binder, MD;  [facility];  Service: Endoscopy;;  gastric biopsy for h pylori  . CARPAL TUNNEL RELEASE Right 12/07/2018  . CATARACT EXTRACTION, BILATERAL     DID 1 YEAR APART , LAST ONE WAS 12-2017  . COLONOSCOPY WITH ESOPHAGOGASTRODUODENOSCOPY (EGD)  2014   [place] PROPOFOL N/A 08/09/2017   3 simple adenomas, diverticulosis in recto-sigmoid, sigmoid, and descending colin, moderate external and internal hemorrhoids  . ESOPHAGOGASTRODUODENOSCOPY (EGD) WITH PROPOFOL N/A 08/09/2017   Low-grade narrowing Schatzki ring due to GERD; small hiatal hernia; mild gastritis and duodenitis due to NSAIDs; biopsy with gastritis  . KNEE ARTHROSCOPY Right 11/2017    WITH DR Wynelle Link  AT [place] Right   . POLYPECTOMY  08/09/2017   Procedure: POLYPECTOMY;  Surgeon: Danie Binder, MD;  [facility];  Service: Endoscopy;;  cecal polyp hs, transverse colon polyp hs, rectal polyp cs  . TOTAL KNEE REVISION Right 03/01/2018   Procedure: RIGHT TOTAL KNEE REVISION;  Surgeon: Gaynelle Arabian, MD;  [facility];  Service: Orthopedics;  Laterality: Right;  146min    There were no vitals filed for this visit.  Subjective Assessment - 03/15/19 1018    Subjective  Doing good. Bought a single point cane that came in yesterday. No falls.    Pertinent History  GBS (diagnosed 9/20) history of OSA, GERD, recent CTR right hand    How long can you walk comfortably?  10-15 minutes or 260'    Diagnostic tests  MRI - had imaging done on brain last week, waiting for results -Had  a nerve conduction test last week: 50% on L, 25% on R    Patient Stated Goals  wants to walk completely without a RW    Currently in Pain?  No/denies    Pain Onset  More than a month ago                   03/15/19 0001  Ambulation/Gait  Ambulation/Gait Yes  Ambulation/Gait Assistance 5: Supervision  Ambulation/Gait  Assistance Details 1 lap around gym with SPC in LUE UE, pt able to demonstrate proper strep through pattern. Ambulating weaving in and out of cones down and back with SPC 2 x 20'  With cues for larger step length. Educated pt on ambulating with SPC at home for smaller distances (10-15') when not fatigued and with husband present.   Ambulation Distance (Feet) 115 Feet  Assistive device Straight cane;Rolling walker  Gait Pattern Step-through pattern  Ambulation Surface Level;Indoor  High Level Balance  High Level Balance Comments Standing on rockerboard in // bars: A/P weight shifting with BUE support on bars progressing to no UE support approx. 1 x 15 reps. With holding board steady in the middle: alternating UE lifts 2 x 5 reps. Min  guard for safety. Breaks taken to monitor fatigue.   Therapeutic Activites   Other Therapeutic Activities With 2 boxes 34 cm from ground picking up cone and placing back down x5 reps. With proper squat technique with wide BOS, cues to push through heels to come up to stand and use BLE. With 2 boxes 30 cm from ground repeated exercise 2 x 5 reps. Seated rest breaks between each rep of 5.              PT Education - 03/15/19 1102    Education Details  gait at home with [facility] - small distances with husband present when pt not feeling fatigued.    Person(s) Educated  Patient    Methods  Explanation    Comprehension  Verbalized understanding       PT Short Term Goals - 02/27/19 1224      PT SHORT TERM GOAL #1   Title  Patient will be independent with ongoing HEP in order to build upon functional gains in therapy. ALL STGS DUE 03/27/19    Time  4    Period  Weeks    Status  On-going    Target Date  03/27/19      PT SHORT TERM GOAL #2   Title  Patient will increase BERG score to at least 40/56 in order to demo decr fall risk.    Baseline  37/56 on 02/27/19, previously 30/56    Time  4    Period  Weeks    Status  Revised      PT SHORT TERM GOAL #3   Title  Patient will decrease 5 times sit <> stand score to at least 22 seconds with single UE support in order to improve functional LE strength.    Baseline  23.44 seconds with BUE support from standard arm chair on 02/27/19    Time  4    Period  Weeks    Status  Revised      PT SHORT TERM GOAL #4   Title  Patient will ambulate at least 230' over outdoor surfaces with RW and supervision in order to improve endurance for gait and community mobility.    Time  4    Period  Weeks    Status  Revised      PT SHORT TERM GOAL #5   Title  Patient will increase gait speed to at least 2.3 ft/sec in order to decrease risk of falls.    Baseline  16.75 seconds = 1.96 ft/sec    Time  4    Period  Weeks    Status  Revised      Additional  Short Term Goals   Additional Short Term Goals  Yes  PT SHORT TERM GOAL #6   Title  Patient will demonstrate proper technique to pick up an object from the floor at least 5 reps with supervision and no LOB.    Time  4    Period  Weeks    Status  New        PT Long Term Goals - 01/29/19 2151      PT LONG TERM GOAL #1   Title  Patient will be independent with final HEP in order to build upon functional gains in therapy. ALL STGS DUE 04/23/19    Time  12    Period  Weeks    Status  New    Target Date  04/23/19      PT LONG TERM GOAL #2   Title  Patient will increase baseline BERG score by at least 10 points in order to demo decreased fall risk.    Time  12    Period  Weeks    Status  New      PT LONG TERM GOAL #3   Title  Patient will ambulate at least 300' outdoors over unlevel surfaces with LRAD and supervision in order to improve community mobility.    Time  12    Period  Weeks    Status  New      PT LONG TERM GOAL #4   Title  Patient will perform TUG in 18 seconds or less with LRAD in order to demo decreased fall risk.    Time  12    Period  Weeks    Status  New      PT LONG TERM GOAL #5   Title  Patient will perform 5x sit <> stand in 18 seconds or less without UE support in order to demo improved LE strength.    Time  12    Period  Weeks    Status  New      Additional Long Term Goals   Additional Long Term Goals  Yes      PT LONG TERM GOAL #6   Title  Patient will improve gait speed to at least 2.6 ft/sec using LRAD in order to demo decreased fall risk and improved community mobility.    Time  12    Period  Weeks    Status  New            Plan - 03/15/19 1107    Clinical Impression Statement  Cleared pt today to begin ambulating at home with [facility] with husband present and small distances only in the [place] (10-15') when not fatigued - pt able to verbalize understanding. Rest breaks taken frequent throughout sesion and reps to monitor fatigue. Pt  able to demonstrate proper squatting technique to pick up a cone off an elevated surface. Will continue to progress towards LTGs.    Personal Factors and Comorbidities  Comorbidity 2;Past/Current Experience    Comorbidities  OSA, GERD    Examination-Activity Limitations  Locomotion Level;Transfers;Stairs;Stand    Examination-Participation Restrictions  Community Activity    Stability/Clinical Decision Making  Evolving/Moderate complexity    Rehab Potential  Good    PT Frequency  3x / week   followed by 2x week for an additional 8 weeks   PT Duration  4 weeks   followed by 2x week for an additional 8 weeks   PT Treatment/Interventions  ADLs/Self Care Home Management;Electrical Stimulation;Aquatic Therapy;Gait training;DME Instruction;Stair training;Functional mobility training;Therapeutic activities;Therapeutic exercise;Patient/family education;Neuromuscular re-education;Balance training;Passive range of motion  PT Next Visit Plan  Continue gait with SPC.    NuStep/SciFit for LE strengthening and endurance. gait with RW, continue sit <> stand training, standing balance activites/dynamic gait in // bars. continue balance on foam. monitor fatigue.    PT Home Exercise Plan  [id number]    Consulted and Agree with Plan of Care  Patient;Family member/caregiver       Patient will benefit from skilled therapeutic intervention in order to improve the following deficits and impairments:  Abnormal gait, Decreased activity tolerance, Decreased coordination, Decreased balance, Decreased endurance, Decreased strength, Difficulty walking, Decreased mobility  Visit Diagnosis: Muscle weakness (generalized)  Other abnormalities of gait and mobility  Difficulty in walking, not elsewhere classified     Problem List Patient Active Problem List   Diagnosis Date Noted  . Guillain Barr syndrome ([place]) 01/19/2019  . Cervical myofascial pain syndrome 12/16/2018  . Carpal tunnel syndrome 12/16/2018  . Axonal  GBS (Guillain-Barre syndrome) ([place]) 12/15/2018  . Leg weakness, bilateral 12/08/2018  . IBS (irritable bowel syndrome) 12/08/2018  . GERD (gastroesophageal reflux disease) 09/14/2018  . 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 Abdominal pain, epigastric 06/03/2017  . Constipation 01/27/2016  . History of adenomatous polyp of colon 01/27/2016    Arliss Journey, PT, DPT  03/15/2019, 11:08 AM  [facility] [address] [place], [place], [address] Phone: [phone]   Fax:  [phone]  Name: RUBA SOBOTTA MRN: [id number] Date of Birth: Sep 04, 1955

## 2019-03-19 ENCOUNTER — Ambulatory Visit: Payer: 59 | Attending: Physical Medicine and Rehabilitation | Admitting: Physical Therapy

## 2019-03-19 ENCOUNTER — Other Ambulatory Visit: Payer: Self-pay

## 2019-03-19 ENCOUNTER — Encounter: Payer: Self-pay | Admitting: Physical Therapy

## 2019-03-19 DIAGNOSIS — R29818 Other symptoms and signs involving the nervous system: Secondary | ICD-10-CM | POA: Diagnosis present

## 2019-03-19 DIAGNOSIS — R262 Difficulty in walking, not elsewhere classified: Secondary | ICD-10-CM | POA: Diagnosis present

## 2019-03-19 DIAGNOSIS — M6281 Muscle weakness (generalized): Secondary | ICD-10-CM

## 2019-03-19 DIAGNOSIS — R2689 Other abnormalities of gait and mobility: Secondary | ICD-10-CM | POA: Insufficient documentation

## 2019-03-19 NOTE — Therapy (Signed)
[facility] [address] [place], [place], [address] Phone: [phone]   Fax:  [phone]  Physical Therapy Treatment  Patient Details  Name: Paula Sutton MRN: [id number] Date of Birth: Nov 15, 1955 Referring Provider (PT): Courtney Heys, MD   Encounter Date: 03/19/2019  PT End of Session - 03/19/19 1936    Visit Number  16    Number of Visits  28    Date for PT Re-Evaluation  04/26/19    Authorization Type  [employer], Medicare Secondary    PT Start Time  [id number]    PT Stop Time  1357    PT Time Calculation (min)  40 min    Equipment Utilized During Treatment  Gait belt    Activity Tolerance  Patient tolerated treatment well    Behavior During Therapy  [facility] for tasks assessed/performed       Past Medical History:  Diagnosis Date  . Arthritis    knee  . Dyslipidemia   . GERD (gastroesophageal reflux disease)   . Left anterior fascicular block    PATIENT SAYS THIS SHOWED ON EKG DURING COLONOSCOPY , WAS REFERRED TO CARDIOLOGY DR. Minus Breeding  (SEE EPIC ENCOUNTER 09-2017)       Past Surgical History:  Procedure Laterality Date  . ABDOMINAL HYSTERECTOMY    . BIOPSY  08/09/2017   Procedure: BIOPSY;  Surgeon: Danie Binder, MD;  [facility];  Service: Endoscopy;;  gastric biopsy for h pylori  . CARPAL TUNNEL RELEASE Right 12/07/2018  . CATARACT EXTRACTION, BILATERAL     DID 1 YEAR APART , LAST ONE WAS 12-2017  . COLONOSCOPY WITH ESOPHAGOGASTRODUODENOSCOPY (EGD)  2014   [place] PROPOFOL N/A 08/09/2017   3 simple adenomas, diverticulosis in recto-sigmoid, sigmoid, and descending colin, moderate external and internal hemorrhoids  . ESOPHAGOGASTRODUODENOSCOPY (EGD) WITH PROPOFOL N/A 08/09/2017   Low-grade narrowing Schatzki ring due to GERD; small hiatal hernia; mild gastritis and duodenitis due to NSAIDs; biopsy with gastritis  . KNEE ARTHROSCOPY Right 11/2017   WITH DR Wynelle Link  AT [facility] Right   . POLYPECTOMY  08/09/2017   Procedure: POLYPECTOMY;  Surgeon: Danie Binder, MD;  [facility];  Service: Endoscopy;;  cecal polyp hs, transverse colon polyp hs, rectal polyp cs  . TOTAL KNEE REVISION Right 03/01/2018   Procedure: RIGHT TOTAL KNEE REVISION;  Surgeon: Gaynelle Arabian, MD;  [facility]: [facility];  Service: Orthopedics;  Laterality: Right;  177min    There were no vitals filed for this visit.  Subjective Assessment - 03/19/19 1320    Subjective  Went to the chiropractor today. No falls. Has been trying to use the cane smaller distances in the house in the morning when not fatigued. Has been standing more at church. "yesterday and today is the best that I have felt since the beginning."    Pertinent History  GBS (diagnosed 9/20) history of OSA, GERD, recent CTR right hand    How long can you walk comfortably?  10-15 minutes or 260'    Diagnostic tests  MRI - had imaging done on brain last week, waiting for results -Had  a nerve conduction test last week: 50% on L, 25% on R    Patient Stated Goals  wants to walk completely without a RW    Currently in Pain?  No/denies    Pain Onset  More than a month ago  [facility] PT Treatment/Exercise - 03/19/19 0001      Ambulation/Gait   Ambulation/Gait  Yes    Ambulation/Gait Assistance  5: Supervision    Ambulation/Gait Assistance Details  1 lap around gym with SPC, no LOB, cues for posture. At end of session ambulated out to pt's car with RW    Ambulation Distance (Feet)  115 Feet   plus additional 130 with RW at end of session   Assistive device  Straight cane;Rolling walker    Gait Pattern  Step-through pattern;Decreased hip/knee flexion - right;Decreased hip/knee flexion - left    Ambulation Surface  Level;Indoor;Outdoor;Paved      Therapeutic Activites    Other Therapeutic Activities  Wide BOS at edge of mat picking up cone  from higher step, cues for technique 5 reps - pt needing to rest due to reports of R knee feeling like it would give out after last rep.        Exercises   Other Exercises   Seated hip ADD pillow squeezes 1 x 10 reps - added to HEP. Sidelying on mat table: active assisted hip flexion/extension 1 x 7 reps B, hip ABD 1 x 7 reps on L, attempted on R however pt unable to perform today without compensatory movement. On mat table with red physioball: ball pull ins and outs using heels and assist from therapist 2 x 7 reps with cues for breathing and core activation, 2 x 5 reps lower trunk rotations with BLE at 90 degrees over red physioball.              PT Education - 03/19/19 1943    Education Details  verbal addition of seated hip ADD pillow squeezes to HEP    Person(s) Educated  Patient    Methods  Explanation;Demonstration    Comprehension  Verbalized understanding;Returned demonstration       PT Short Term Goals - 02/27/19 1224      PT SHORT TERM GOAL #1   Title  Patient will be independent with ongoing HEP in order to build upon functional gains in therapy. ALL STGS DUE 03/27/19    Time  4    Period  Weeks    Status  On-going    Target Date  03/27/19      PT SHORT TERM GOAL #2   Title  Patient will increase BERG score to at least 40/56 in order to demo decr fall risk.    Baseline  37/56 on 02/27/19, previously 30/56    Time  4    Period  Weeks    Status  Revised      PT SHORT TERM GOAL #3   Title  Patient will decrease 5 times sit <> stand score to at least 22 seconds with single UE support in order to improve functional LE strength.    Baseline  23.44 seconds with BUE support from standard arm chair on 02/27/19    Time  4    Period  Weeks    Status  Revised      PT SHORT TERM GOAL #4   Title  Patient will ambulate at least 230' over outdoor surfaces with RW and supervision in order to improve endurance for gait and community mobility.    Time  4    Period  Weeks     Status  Revised      PT SHORT TERM GOAL #5   Title  Patient will increase gait speed to at least 2.3 ft/sec in  order to decrease risk of falls.    Baseline  16.75 seconds = 1.96 ft/sec    Time  4    Period  Weeks    Status  Revised      Additional Short Term Goals   Additional Short Term Goals  Yes      PT SHORT TERM GOAL #6   Title  Patient will demonstrate proper technique to pick up an object from the floor at least 5 reps with supervision and no LOB.    Time  4    Period  Weeks    Status  New        PT Long Term Goals - 01/29/19 2151      PT LONG TERM GOAL #1   Title  Patient will be independent with final HEP in order to build upon functional gains in therapy. ALL STGS DUE 04/23/19    Time  12    Period  Weeks    Status  New    Target Date  04/23/19      PT LONG TERM GOAL #2   Title  Patient will increase baseline BERG score by at least 10 points in order to demo decreased fall risk.    Time  12    Period  Weeks    Status  New      PT LONG TERM GOAL #3   Title  Patient will ambulate at least 300' outdoors over unlevel surfaces with LRAD and supervision in order to improve community mobility.    Time  12    Period  Weeks    Status  New      PT LONG TERM GOAL #4   Title  Patient will perform TUG in 18 seconds or less with LRAD in order to demo decreased fall risk.    Time  12    Period  Weeks    Status  New      PT LONG TERM GOAL #5   Title  Patient will perform 5x sit <> stand in 18 seconds or less without UE support in order to demo improved LE strength.    Time  12    Period  Weeks    Status  New      Additional Long Term Goals   Additional Long Term Goals  Yes      PT LONG TERM GOAL #6   Title  Patient will improve gait speed to at least 2.6 ft/sec using LRAD in order to demo decreased fall risk and improved community mobility.    Time  12    Period  Weeks    Status  New            Plan - 03/19/19 1937    Clinical Impression Statement  Focus  of today's session was gait training and LE/core strengthening. Pt unable to tolerate more than 5 reps of wide BOS squats in standing today to due to reports of R knee giving out/being easily fatigued. Discussed with pt about trialing knee brace that pt has at home to decr fear of R knee giving out. At end of session pt able to ambulate out of clinic with RW. Will continue to progress towards LTGs.    Personal Factors and Comorbidities  Comorbidity 2;Past/Current Experience    Comorbidities  OSA, GERD    Examination-Activity Limitations  Locomotion Level;Transfers;Stairs;Stand    Examination-Participation Restrictions  Community Activity    Stability/Clinical Decision Making  Evolving/Moderate complexity  Rehab Potential  Good    PT Frequency  3x / week   followed by 2x week for an additional 8 weeks   PT Duration  4 weeks   followed by 2x week for an additional 8 weeks   PT Treatment/Interventions  ADLs/Self Care Home Management;Electrical Stimulation;Aquatic Therapy;Gait training;DME Instruction;Stair training;Functional mobility training;Therapeutic activities;Therapeutic exercise;Patient/family education;Neuromuscular re-education;Balance training;Passive range of motion    PT Next Visit Plan  has pt trialed knee brace? gait into clinic with RW. Continue gait with SPC.   NuStep/SciFit for LE strengthening and endurance. continue sit <> stand training, standing balance activites/dynamic gait in // bars. continue balance on foam/compliant surfaces. monitor fatigue.    PT Home Exercise Plan  [id number] plus seated hip ADD pillow squeezes    Consulted and Agree with Plan of Care  Patient;Family member/caregiver       Patient will benefit from skilled therapeutic intervention in order to improve the following deficits and impairments:  Abnormal gait, Decreased activity tolerance, Decreased coordination, Decreased balance, Decreased endurance, Decreased strength, Difficulty walking, Decreased  mobility  Visit Diagnosis: Muscle weakness (generalized)  Other abnormalities of gait and mobility  Difficulty in walking, not elsewhere classified     Problem List Patient Active Problem List   Diagnosis Date Noted  . Guillain Barr syndrome ([place]) 01/19/2019  . Cervical myofascial pain syndrome 12/16/2018  . Carpal tunnel syndrome 12/16/2018  . Axonal GBS (Guillain-Barre syndrome) ([place]) 12/15/2018  . Leg weakness, bilateral 12/08/2018  . IBS (irritable bowel syndrome) 12/08/2018  . GERD (gastroesophageal reflux disease) 09/14/2018  . 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 Abdominal pain, epigastric 06/03/2017  . Constipation 01/27/2016  . History of adenomatous polyp of colon 01/27/2016    Arliss Journey, PT, DPT  03/19/2019, 7:44 PM  [facility] [address] [place], [place], [address] Phone: [phone]   Fax:  [phone]  Name: Paula Sutton MRN: [id number] Date of Birth: Jun 14, 1955

## 2019-03-21 ENCOUNTER — Encounter: Payer: Self-pay | Admitting: Physical Therapy

## 2019-03-21 ENCOUNTER — Ambulatory Visit: Payer: 59 | Admitting: Physical Therapy

## 2019-03-21 ENCOUNTER — Ambulatory Visit: Payer: 59 | Admitting: Physical Medicine and Rehabilitation

## 2019-03-21 ENCOUNTER — Other Ambulatory Visit: Payer: Self-pay

## 2019-03-21 DIAGNOSIS — R262 Difficulty in walking, not elsewhere classified: Secondary | ICD-10-CM

## 2019-03-21 DIAGNOSIS — M6281 Muscle weakness (generalized): Secondary | ICD-10-CM

## 2019-03-21 DIAGNOSIS — R29818 Other symptoms and signs involving the nervous system: Secondary | ICD-10-CM

## 2019-03-21 DIAGNOSIS — R2689 Other abnormalities of gait and mobility: Secondary | ICD-10-CM

## 2019-03-23 NOTE — Therapy (Signed)
[facility] [address] [place], [place], [address] Phone: [phone]   Fax:  [phone]  Physical Therapy Treatment  Patient Details  Name: Paula Sutton MRN: [id number] Date of Birth: [birth year] Referring Provider (PT): Courtney Heys, MD   Encounter Date: 03/21/2019    03/21/19 1410  PT Visits / Re-Eval  Visit Number 17  Number of Visits 28  Date for PT Re-Evaluation 04/26/19  Authorization  Authorization Type [employer], Medicare Secondary  PT Time Calculation  PT Start Time [id number]  PT Stop Time 1445  PT Time Calculation (min) 40 min  PT - End of Session  Equipment Utilized During Treatment Gait belt  Activity Tolerance Patient tolerated treatment well  Behavior During Therapy [facility] for tasks assessed/performed     Past Medical History:  Diagnosis Date  . Arthritis    knee  . Dyslipidemia   . GERD (gastroesophageal reflux disease)   . Left anterior fascicular block    PATIENT SAYS THIS SHOWED ON EKG DURING COLONOSCOPY , WAS REFERRED TO CARDIOLOGY DR. Minus Breeding  (SEE EPIC ENCOUNTER 09-2017)       Past Surgical History:  Procedure Laterality Date  . ABDOMINAL HYSTERECTOMY    . BIOPSY  08/09/2017   Procedure: BIOPSY;  Surgeon: Danie Binder, MD;  [facility];  Service: Endoscopy;;  gastric biopsy for h pylori  . CARPAL TUNNEL RELEASE Right 12/07/2018  . CATARACT EXTRACTION, BILATERAL     DID 1 YEAR APART , LAST ONE WAS 12-2017  . COLONOSCOPY WITH ESOPHAGOGASTRODUODENOSCOPY (EGD)  2014   [place] PROPOFOL N/A 08/09/2017   3 simple adenomas, diverticulosis in recto-sigmoid, sigmoid, and descending colin, moderate external and internal hemorrhoids  . ESOPHAGOGASTRODUODENOSCOPY (EGD) WITH PROPOFOL N/A 08/09/2017   Low-grade narrowing Schatzki ring due to GERD; small hiatal hernia; mild gastritis and duodenitis due to NSAIDs; biopsy with gastritis  .  KNEE ARTHROSCOPY Right 11/2017   WITH DR Wynelle Link  AT [facility] Right   . POLYPECTOMY  08/09/2017   Procedure: POLYPECTOMY;  Surgeon: Danie Binder, MD;  [facility];  Service: Endoscopy;;  cecal polyp hs, transverse colon polyp hs, rectal polyp cs  . TOTAL KNEE REVISION Right 03/01/2018   Procedure: RIGHT TOTAL KNEE REVISION;  Surgeon: Gaynelle Arabian, MD;  [facility];  Service: Orthopedics;  Laterality: Right;  175min    There were no vitals filed for this visit.     03/21/19 1408  Symptoms/Limitations  Subjective Going well with use of cane at home. Does report her spouse is going out of town and not sure how to get the band on for some of the ex's. No falls or pain to report.  Pertinent History GBS (diagnosed 9/20) history of OSA, GERD, recent CTR right hand  How long can you walk comfortably? 10-15 minutes or 260'  Diagnostic tests MRI - had imaging done on brain last week, waiting for results -Had  a nerve conduction test last week: 50% on L, 25% on R  Patient Stated Goals wants to walk completely without a RW  Pain Assessment  Currently in Pain? No/denies  Pain Score 0      03/21/19 1411  Transfers  Transfers Sit to Stand;Stand to Sit  Sit to Stand 6: Modified independent (Device/Increase time)  Stand to Sit 6: Modified independent (Device/Increase time)  Ambulation/Gait  Ambulation/Gait Yes  Ambulation/Gait Assistance 5: Supervision;4: Min guard  Ambulation/Gait Assistance Details 1st lap working on distance with cane with cues for posture, to relax right UE and for step length. second lap worked on scanning all directions randomly with no balance loss noted, decreased gait speed with scanning.   Ambulation Distance (Feet) 230 Feet (x1, 115 x1)  Assistive device Straight cane;Rolling walker  Gait Pattern Step-through pattern;Decreased hip/knee flexion - right;Decreased hip/knee flexion - left  Ambulation Surface Level;Indoor   Self-Care  Self-Care Other Self-Care Comments  Other Self-Care Comments  looked at the steppers we have in clinic. Found band resistance workeds best vs the ones with a knob to turn for resistance as they were too much on even the lowest settings. Looked at [facility] on options and discussed [employer], [employer] and other discount places that may have them; then had pt place band on LE's, lie down, and get up to practice these components for HEP at home without spouse assistance. Pt was able to do this without assistance.    Exercises  Exercises Other Exercises  Other Exercises  seated on edge of mat: sit<>stands while holding green band tight around knees for 10 reps, emphasis on tall posture with each stand and slow, controlled descent; sit<>stands while squeezing yoga block for 10 reps, cues for tall posture, controlled descent, min guard assist.         PT Short Term Goals - 02/27/19 1224      PT SHORT TERM GOAL #1   Title  Patient will be independent with ongoing HEP in order to build upon functional gains in therapy. ALL STGS DUE 03/27/19    Time  4    Period  Weeks    Status  On-going    Target Date  03/27/19      PT SHORT TERM GOAL #2   Title  Patient will increase BERG score to at least 40/56 in order to demo decr fall risk.    Baseline  37/56 on 02/27/19, previously 30/56    Time  4    Period  Weeks    Status  Revised      PT SHORT TERM GOAL #3   Title  Patient will decrease 5 times sit <> stand score to at least 22 seconds with single UE support in order to improve functional LE strength.    Baseline  23.44 seconds with BUE support from standard arm chair on 02/27/19    Time  4    Period  Weeks    Status  Revised      PT SHORT TERM GOAL #4   Title  Patient will ambulate at least 230' over outdoor surfaces with RW and supervision in order to improve endurance for gait and community mobility.    Time  4    Period  Weeks    Status  Revised      PT SHORT TERM GOAL #5   Title   Patient will increase gait speed to at least 2.3 ft/sec in order to decrease risk of falls.    Baseline  16.75 seconds = 1.96 ft/sec    Time  4    Period  Weeks    Status  Revised      Additional Short Term Goals   Additional Short Term Goals  Yes      PT SHORT TERM GOAL #6   Title  Patient will demonstrate proper technique to pick up an object from the floor at least 5 reps with supervision and no LOB.    Time  4    Period  Weeks    Status  New        PT Long Term Goals - 01/29/19 2151      PT LONG TERM GOAL #1   Title  Patient will be independent with final HEP in order to build upon functional gains in therapy. ALL STGS DUE 04/23/19    Time  12    Period  Weeks    Status  New    Target Date  04/23/19      PT LONG TERM GOAL #2   Title  Patient will increase baseline BERG score by at least 10 points in order to demo decreased fall risk.    Time  12    Period  Weeks    Status  New      PT LONG TERM GOAL #3   Title  Patient will ambulate at least 300' outdoors over unlevel surfaces with LRAD and supervision in order to improve community mobility.    Time  12    Period  Weeks    Status  New      PT LONG TERM GOAL #4   Title  Patient will perform TUG in 18 seconds or less with LRAD in order to demo decreased fall risk.    Time  12    Period  Weeks    Status  New      PT LONG TERM GOAL #5   Title  Patient will perform 5x sit <> stand in 18 seconds or less without UE support in order to demo improved LE strength.    Time  12    Period  Weeks    Status  New      Additional Long Term Goals   Additional Long Term Goals  Yes      PT LONG TERM GOAL #6   Title  Patient will improve gait speed to at least 2.6 ft/sec using LRAD in order to demo decreased fall risk and improved community mobility.    Time  12    Period  Weeks    Status  New         03/21/19 1410  Plan  Clinical Impression Statement Today's skilled session intially focused on addressing concerns for  home exercise with all concerns addressed. Remainder of session focused on gait with cane and strengthening. No issues reported in session. The pt is progressing toward goals and should benefit from continued PT to progress toward unmet goals.  Personal Factors and Comorbidities Comorbidity 2;Past/Current Experience  Comorbidities OSA, GERD  Examination-Activity Limitations Locomotion Level;Transfers;Stairs;Stand  Examination-Participation Restrictions Community Activity  Pt will benefit from skilled therapeutic intervention in order to improve on the following deficits Abnormal gait;Decreased activity tolerance;Decreased coordination;Decreased balance;Decreased endurance;Decreased strength;Difficulty walking;Decreased mobility  Stability/Clinical Decision Making Evolving/Moderate complexity  Rehab Potential Good  PT Frequency 3x / week (followed by 2x week for an additional 8 weeks)  PT Duration 4 weeks (followed by 2x week for an additional 8 weeks)  PT Treatment/Interventions ADLs/Self Care Home Management;Electrical Stimulation;Aquatic Therapy;Gait training;DME Instruction;Stair training;Functional mobility training;Therapeutic activities;Therapeutic exercise;Patient/family education;Neuromuscular re-education;Balance training;Passive range of motion  PT Next Visit Plan Continue gait with SPC. How did the knee brace work.   NuStep/SciFit for LE strengthening and endurance. continue sit <> stand training, standing balance activites/dynamic gait in // bars. continue balance on foam/compliant surfaces. monitor fatigue.  PT Home Exercise Plan [id number] plus seated hip ADD pillow squeezes  Consulted and Agree with Plan of Care Patient;Family  member/caregiver          Patient will benefit from skilled therapeutic intervention in order to improve the following deficits and impairments:  Abnormal gait, Decreased activity tolerance, Decreased coordination, Decreased balance, Decreased endurance,  Decreased strength, Difficulty walking, Decreased mobility  Visit Diagnosis: Muscle weakness (generalized)  Other abnormalities of gait and mobility  Difficulty in walking, not elsewhere classified  Other symptoms and signs involving the nervous system     Problem List Patient Active Problem List   Diagnosis Date Noted  . Guillain Barr syndrome ([place]) 01/19/2019  . Cervical myofascial pain syndrome 12/16/2018  . Carpal tunnel syndrome 12/16/2018  . Axonal GBS (Guillain-Barre syndrome) ([place]) 12/15/2018  . Leg weakness, bilateral 12/08/2018  . IBS (irritable bowel syndrome) 12/08/2018  . GERD (gastroesophageal reflux disease) 09/14/2018  . 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 Abdominal pain, epigastric 06/03/2017  . Constipation 01/27/2016  . History of adenomatous polyp of colon 01/27/2016    Willow Ora, PTA, [facility] Outpatient Neuro [facility] [address] [place], [place] [address] [phone] 03/23/19, 3:25 PM   Name: Paula Sutton MRN: [id number] Date of Birth: 04-Apr-1955

## 2019-03-26 ENCOUNTER — Ambulatory Visit: Payer: 59 | Admitting: Physical Therapy

## 2019-03-28 ENCOUNTER — Encounter: Payer: Self-pay | Admitting: Physical Medicine and Rehabilitation

## 2019-03-28 ENCOUNTER — Encounter: Payer: 59 | Attending: Physical Medicine & Rehabilitation | Admitting: Physical Medicine and Rehabilitation

## 2019-03-28 ENCOUNTER — Encounter: Payer: 59 | Admitting: Physical Medicine and Rehabilitation

## 2019-03-28 ENCOUNTER — Ambulatory Visit: Payer: 59 | Admitting: Physical Therapy

## 2019-03-28 ENCOUNTER — Encounter: Payer: Self-pay | Admitting: Physical Therapy

## 2019-03-28 ENCOUNTER — Other Ambulatory Visit: Payer: Self-pay

## 2019-03-28 VITALS — BP 134/78 | HR 61 | Temp 97.6°F | Ht 63.0 in | Wt 157.0 lb

## 2019-03-28 DIAGNOSIS — G610 Guillain-Barre syndrome: Secondary | ICD-10-CM | POA: Diagnosis present

## 2019-03-28 DIAGNOSIS — N3281 Overactive bladder: Secondary | ICD-10-CM

## 2019-03-28 DIAGNOSIS — G5601 Carpal tunnel syndrome, right upper limb: Secondary | ICD-10-CM | POA: Diagnosis present

## 2019-03-28 DIAGNOSIS — M6281 Muscle weakness (generalized): Secondary | ICD-10-CM

## 2019-03-28 DIAGNOSIS — R29898 Other symptoms and signs involving the musculoskeletal system: Secondary | ICD-10-CM

## 2019-03-28 DIAGNOSIS — R2689 Other abnormalities of gait and mobility: Secondary | ICD-10-CM

## 2019-03-28 DIAGNOSIS — R262 Difficulty in walking, not elsewhere classified: Secondary | ICD-10-CM

## 2019-03-28 NOTE — Progress Notes (Signed)
Subjective:    Patient ID: Paula Sutton, female    DOB: [birth year], 64 y.o.   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    Burning in feet not as bad overall, but still worse at night.   Overall, nerve pain is better esp abdominal pain.  Got on routine- with stretching, working out, etc.  Started wearing Depends esp at night- can't make it to bathroom.  The moment has to go, HAS to go.  Hasn't seen a Dealer.  Has actual PAIN when needs to go- then has urge to go, then has a few seconds, and starts going.  Esp at night.  Is a sharp pain, not clenching/muscle spasm- it actually burns like UTI, but doesn't think has UTI.  Has been Awhile, since started- Since November/December and has gotten worse.   However last night was better- didn't need to go in middle of night.   No bowel accidents- can hold it when needs to- but goes at least 1x/day.  Depends on what she eats.    Walking through house now- going to get Fit[employer]- more comfortable without cane than with cane; also uses RW still- wants pt to NOT use W/C anymore with PT.  Walks to [employer] with RW- end of driveway.  Does 15 different HEPs at home- some improvement.  When picks R hip up more, GBS pain  Acts up more.  Can't kick out with R knee out straight.    Pain Inventory Average Pain 1 Pain Right Now 1 My pain is burning and aching  In the last 24 hours, has pain interfered with the following? General activity 0 Relation with others 0 Enjoyment of life 0 What TIME of day is your pain at its worst? night Sleep (in general) Good  Pain is worse with: bending, sitting and inactivity Pain improves with: rest and therapy/exercise Relief from Meds: na  Mobility walk without assistance use a cane use a walker ability to climb steps?  no do you drive?  no  Function not employed: date last employed . I need assistance with the following:   shopping  Neuro/Psych bladder control problems spasms confusion  Prior Studies Any changes since last visit?  no  Physicians involved in your care Any changes since last visit?  no   Family History  Problem Relation Age of Onset  . Alcohol abuse Mother   . Colon cancer Neg Hx   . Breast cancer Neg Hx    Social History   Socioeconomic History  . Marital status: Married    Spouse name: Not on file  . Number of children: Not on file  . Years of education: Not on file  . Highest education level: Not on file  Occupational History  . Occupation: Retired     Comment: Glass blower/designer  Tobacco Use  . Smoking status: Former Smoker    Types: Cigarettes    Quit date: 01/27/1971    Years since quitting: 48.1  . Smokeless tobacco: Never Used  Substance and Sexual Activity  . Alcohol use: Yes    Comment: Occasional: 1-2 times/month  . Drug use: Yes    Frequency: 7.0 times per week    Types: Marijuana  . Sexual activity: Not on file  Other Topics Concern  . Not on file  Social History Narrative   Lives at home with husband.   Social Determinants of Radio broadcast assistant  Strain:   . Difficulty of Paying Living Expenses: Not on file  Food Insecurity:   . Worried About Charity fundraiser in the Last Year: Not on file  . Ran Out of Food in the Last Year: Not on file  Transportation Needs:   . Lack of Transportation (Medical): Not on file  . Lack of Transportation (Non-Medical): Not on file  Physical Activity:   . Days of Exercise per Week: Not on file  . Minutes of Exercise per Session: Not on file  Stress:   . Feeling of Stress : Not on file  Social Connections:   . Frequency of Communication with Friends and Family: Not on file  . Frequency of Social Gatherings with Friends and Family: Not on file  . Attends Religious Services: Not on file  . Active Member of Clubs or Organizations: Not on file  . Attends Archivist Meetings: Not on file  . Marital  Status: Not on file   Past Surgical History:  Procedure Laterality Date  . ABDOMINAL HYSTERECTOMY    . BIOPSY  08/09/2017   Procedure: BIOPSY;  Surgeon: Danie Binder, MD;  [facility];  Service: Endoscopy;;  gastric biopsy for h pylori  . CARPAL TUNNEL RELEASE Right 12/07/2018  . CATARACT EXTRACTION, BILATERAL     DID 1 YEAR APART , LAST ONE WAS 12-2017  . COLONOSCOPY WITH ESOPHAGOGASTRODUODENOSCOPY (EGD)  2014   [place] PROPOFOL N/A 08/09/2017   3 simple adenomas, diverticulosis in recto-sigmoid, sigmoid, and descending colin, moderate external and internal hemorrhoids  . ESOPHAGOGASTRODUODENOSCOPY (EGD) WITH PROPOFOL N/A 08/09/2017   Low-grade narrowing Schatzki ring due to GERD; small hiatal hernia; mild gastritis and duodenitis due to NSAIDs; biopsy with gastritis  . KNEE ARTHROSCOPY Right 11/2017   WITH DR Wynelle Link  AT [place] Right   . POLYPECTOMY  08/09/2017   Procedure: POLYPECTOMY;  Surgeon: Danie Binder, MD;  [facility]: [facility];  Service: Endoscopy;;  cecal polyp hs, transverse colon polyp hs, rectal polyp cs  . TOTAL KNEE REVISION Right 03/01/2018   Procedure: RIGHT TOTAL KNEE REVISION;  Surgeon: Gaynelle Arabian, MD;  Location: [facility];  Service: Orthopedics;  Laterality: Right;  167min   Past Medical History:  Diagnosis Date  . Arthritis    knee  . Dyslipidemia   . GERD (gastroesophageal reflux disease)   . Left anterior fascicular block    PATIENT SAYS THIS SHOWED ON EKG DURING COLONOSCOPY , WAS REFERRED TO CARDIOLOGY DR. Jeneen Rinks HOCHREIN  (SEE EPIC ENCOUNTER [phone])      BP 134/78   Pulse 61   Temp 97.6 F (36.4 C)   Ht 5\' 3"  (1.6 m)   Wt 157 lb (71.2 kg)   SpO2 95%   BMI 27.81 kg/m   Opioid Risk Score:   Fall Risk Score:  `1  Depression screen PHQ 2/9  No flowsheet data found.  Review of Systems  Genitourinary: Positive for dysuria.  Musculoskeletal: Positive for back pain and  gait problem.  All other systems reviewed and are negative.      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 And Overactive bladder Sx's   1. Urology referral to see if has to do with Guillian-Barre vs "overactive bladder". Will check U/A just to make sure, even though has been months, will check for UTI first.   2. Timed voiding- every 1-2 hours- no more than every 2 hours- go to bathroom, Whether need to or not". And see if helps.   3. Check with PCP to check U/A and Urine Cx for possible UTI- prior to going to Urology.   4. Make Diary of timed voiding to see if working all, working some or not working.   5. F/U in 3 months

## 2019-03-28 NOTE — Patient Instructions (Signed)
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 And Overactive bladder Sx's   1. Urology referral to see if has to do with Guillian-Barre vs "overactive bladder". Will check U/A just to make sure, even though has been months, will check for UTI first.   2. Timed voiding- every 1-2 hours- no more than every 2 hours- go to bathroom, Whether need to or not". And see if helps.   3. Check with PCP to check U/A and Urine Cx for possible UTI- prior to going to Urology.   4. Make Diary of timed voiding to see if working all, working some or not working.   5. F/U in 3 months

## 2019-03-29 NOTE — Therapy (Signed)
[facility] [address] [place], [place], [address] Phone: [phone]   Fax:  [phone]  Physical Therapy Treatment  Patient Details  Name: Paula Sutton MRN: [id number] Date of Birth: 19-Jan-1956 Referring Provider (PT): Courtney Heys, MD   Encounter Date: 03/28/2019  PT End of Session - 03/29/19 1442    Visit Number  18    Number of Visits  28    Date for PT Re-Evaluation  04/26/19    Authorization Type  [employer], Medicare Secondary    PT Start Time  1400    PT Stop Time  1444    PT Time Calculation (min)  44 min    Equipment Utilized During Treatment  Gait belt    Activity Tolerance  Patient tolerated treatment well    Behavior During Therapy  [facility] for tasks assessed/performed       Past Medical History:  Diagnosis Date  . Arthritis    knee  . Dyslipidemia   . GERD (gastroesophageal reflux disease)   . Left anterior fascicular block    PATIENT SAYS THIS SHOWED ON EKG DURING COLONOSCOPY , WAS REFERRED TO CARDIOLOGY DR. Minus Breeding  (SEE EPIC ENCOUNTER 09-2017)       Past Surgical History:  Procedure Laterality Date  . ABDOMINAL HYSTERECTOMY    . BIOPSY  08/09/2017   Procedure: BIOPSY;  Surgeon: Danie Binder, MD;  [facility];  Service: Endoscopy;;  gastric biopsy for h pylori  . CARPAL TUNNEL RELEASE Right 12/07/2018  . CATARACT EXTRACTION, BILATERAL     DID 1 YEAR APART , LAST ONE WAS 12-2017  . COLONOSCOPY WITH ESOPHAGOGASTRODUODENOSCOPY (EGD)  2014   [place] PROPOFOL N/A 08/09/2017   3 simple adenomas, diverticulosis in recto-sigmoid, sigmoid, and descending colin, moderate external and internal hemorrhoids  . ESOPHAGOGASTRODUODENOSCOPY (EGD) WITH PROPOFOL N/A 08/09/2017   Low-grade narrowing Schatzki ring due to GERD; small hiatal hernia; mild gastritis and duodenitis due to NSAIDs; biopsy with gastritis  . KNEE ARTHROSCOPY Right 11/2017   WITH DR Wynelle Link  AT [place] Right   . POLYPECTOMY  08/09/2017   Procedure: POLYPECTOMY;  Surgeon: Danie Binder, MD;  [facility];  Service: Endoscopy;;  cecal polyp hs, transverse colon polyp hs, rectal polyp cs  . TOTAL KNEE REVISION Right 03/01/2018   Procedure: RIGHT TOTAL KNEE REVISION;  Surgeon: Gaynelle Arabian, MD;  Location: [facility];  Service: Orthopedics;  Laterality: Right;  127mn    There were no vitals filed for this visit.  Subjective Assessment - 03/28/19 1403    Subjective  Saw Dr. LDagoberto Ligasregarding GBS and bladder symptoms. Feeling really good today. Walked in with RW. Exercises are still going well. Walking with the cane at the house and also without the cane at home. Feet still burning - worse with shoes on then no shoes.    Pertinent History  GBS (diagnosed 9/20) history of OSA, GERD, recent CTR right hand    How long can you walk comfortably?  10-15 minutes or 260'    Diagnostic tests  MRI - had imaging done on brain last week, waiting for results -Had  a nerve conduction test last week: 50% on L, 25% on R    Patient Stated Goals  wants to walk completely without a RW    Currently in Pain?  No/denies         [facility] -  03/28/19 1426      Berg Balance Test   Sit to Stand  Able to stand  independently using hands    Standing Unsupported  Able to stand safely 2 minutes    Sitting with Back Unsupported but Feet Supported on Floor or Stool  Able to sit safely and securely 2 minutes    Stand to Sit  Controls descent by using hands    Transfers  Able to transfer safely, definite need of hands    Standing Unsupported with Eyes Closed  Able to stand 10 seconds safely    Standing Unsupported with Feet Together  Able to place feet together independently and stand 1 minute safely    From Standing, Reach Forward with Outstretched Arm  Can reach confidently >25 cm (10")    From Standing Position, Pick up Object from Floor  Able to  pick up shoe, needs supervision    From Standing Position, Turn to Look Behind Over each Shoulder  Looks behind from both sides and weight shifts well    Turn 360 Degrees  Able to turn 360 degrees safely but slowly   10 seconds turning to R, 8 seconds turning to L   Standing Unsupported, Alternately Place Feet on Step/Stool  Needs assistance to keep from falling or unable to try    Standing Unsupported, One Foot in Front  Able to plae foot ahead of the other independently and hold 30 seconds    Standing on One Leg  Tries to lift leg/unable to hold 3 seconds but remains standing independently   standing on LLE   Total Score  42    Berg comment:  42/56               03/29/19 0001  Transfers  Five time sit to stand comments  22 seconds with BUE support from standard arm chair  Comments 3 reps wide BOS squatting to pick up object from floor - standing in RW, supervision, pt favors LLE for weight shift  Ambulation/Gait  Ambulation/Gait Yes  Ambulation/Gait Assistance 5: Supervision;4: Min guard;2: Max assist  Ambulation/Gait Assistance Details 1st lap with RW. 2nd lap with SPC. pt demonstrating improved heel > toe pattern with gait today, however pt with one instance of tripping over R foot/poor foot clearance causing pt to trip and lose balance anteriorly - requiring max A of therapist using therapist to help prevent/decelrate fall. pt fell reaching BUE outward to catch herself, immediate help of 2 additional therapists to help stand pt back up. Pt reporting no pain and did was not injured. Educated pt on use of SPC at home for only small distances with husband present and using RW for majority of ambulation.    Ambulation Distance (Feet) 230 Feet ((2 x 115'))  Assistive device Straight cane;Rolling walker  Gait Pattern Step-through pattern;Decreased hip/knee flexion - right;Decreased hip/knee flexion - left  Ambulation Surface Level;Indoor  Exercises  Other Exercises  2 x 10 reps seated  knee flexion curls with green theraband on R         [facility] Adult PT Treatment/Exercise - 03/29/19 0001      Transfers   Five time sit to stand comments   22 seconds with BUE support from standard arm chair    Comments  3 reps wide BOS squatting to pick up object from floor - standing in RW, supervision, pt favors LLE for weight shift      Ambulation/Gait   Ambulation/Gait  Yes    Ambulation/Gait  Assistance  5: Supervision;4: Min guard;2: Max assist    Ambulation/Gait Assistance Details  1st lap with RW. 2nd lap with SPC. pt demonstrating improved heel > toe pattern with gait today, however pt with one instance of tripping over R foot/poor foot clearance causing pt to trip and lose balance anteriorly - requiring max A of therapist using therapist to help prevent/decelrate fall. pt fell reaching BUE outward to catch herself, immediate help of 2 additional therapists to help stand pt back up. Pt reporting no pain and did was not injured. Educated pt on use of SPC at home for only small distances with husband present and using RW for majority of ambulation.      Ambulation Distance (Feet)  230 Feet   (2 x 115')   Assistive device  Straight cane;Rolling walker    Gait Pattern  Step-through pattern;Decreased hip/knee flexion - right;Decreased hip/knee flexion - left    Ambulation Surface  Level;Indoor      Exercises   Other Exercises   2 x 10 reps seated knee flexion curls with green theraband on R             PT Education - 03/29/19 1442    Education Details  progress towards goals    Person(s) Educated  Patient    Methods  Explanation    Comprehension  Verbalized understanding       PT Short Term Goals - 03/28/19 1408      PT SHORT TERM GOAL #1   Title  Patient will be independent with ongoing HEP in order to build upon functional gains in therapy. ALL STGS DUE 03/27/19    Baseline  pt is independent with HEP    Time  4    Period  Weeks    Status  Achieved    Target Date   03/27/19      PT SHORT TERM GOAL #2   Title  Patient will increase BERG score to at least 40/56 in order to demo decr fall risk.    Baseline  42/56 on 03/28/19    Time  4    Period  Weeks    Status  Achieved      PT SHORT TERM GOAL #3   Title  Patient will decrease 5 times sit <> stand score to at least 22 seconds with single UE support in order to improve functional LE strength.    Baseline  22 seconds with BUE support from standard arm chair on 03/28/19    Time  4    Period  Weeks    Status  Not Met      PT SHORT TERM GOAL #4   Title  Patient will ambulate at least 230' over outdoor surfaces with RW and supervision in order to improve endurance for gait and community mobility.    Baseline  pt ambulated into clinic from car with RW with mod I and out back to car with RW with supervision.    Time  4    Period  Weeks    Status  Achieved      PT SHORT TERM GOAL #5   Title  Patient will increase gait speed to at least 2.3 ft/sec in order to decrease risk of falls.    Baseline  1.75 ft/sec with SPC, 1.90 with RW on 03/28/19    Time  4    Period  Weeks    Status  Not Met      PT SHORT TERM GOAL #  6   Title  Patient will demonstrate proper technique to pick up an object from the floor at least 5 reps with supervision and no LOB.    Baseline  3 reps - pt dependent on LLE for weight shift    Time  4    Period  Weeks    Status  New        PT Long Term Goals - 03/29/19 1454      PT LONG TERM GOAL #1   Title  Patient will be independent with final HEP in order to build upon functional gains in therapy. ALL LTGS DUE 04/26/19    Time  12    Period  Weeks    Status  New      PT LONG TERM GOAL #2   Title  Patient will increase baseline BERG score to at least 44/56 in order to demo decr fall risk.    Baseline  42/56    Time  12    Period  Weeks    Status  Revised      PT LONG TERM GOAL #3   Title  Patient will ambulate at least 300' outdoors over unlevel surfaces with LRAD and  supervision in order to improve community mobility.    Time  12    Period  Weeks    Status  New      PT LONG TERM GOAL #4   Title  Patient will perform TUG in 18 seconds or less with LRAD in order to demo decreased fall risk.    Time  12    Period  Weeks    Status  New      PT LONG TERM GOAL #5   Title  Patient will perform 5x sit <> stand in 18 seconds or less without BUE support in order to demo improved LE strength.    Baseline  22 seconds with BUE support from arm chair    Time  12    Period  Weeks    Status  Revised      PT LONG TERM GOAL #6   Title  Patient will improve gait speed to at least 2.2 ft/sec using LRAD in order to demo decreased fall risk and improved community mobility.    Time  12    Period  Weeks    Status  New            Plan - 03/29/19 1453    Clinical Impression Statement  Focus of today's skilled session was assessing pt's STGs- pt has achieved 3 out of 5 STGs in regards to HEP, BERG score, and ambulating over outdoor surfaces with RW. Pt did not meet goal in regards to 5x sit <> stand score and gait speed. Pt's gait speed with SPC of 1.75 ft/sec indicates recurrent fall risk and gait with RW indicates limited community ambulator. Revised LTGs as appropriate.  During session when ambulating with SPC - pt tripped over R foot/had decr foot clearance, requiring max A of therapist with gait belt to prevent fall forward - with pt reaching BUE outward to help catch herself. Immediate help of 2 other therapists present to help stand pt back up and to sitting. Pt reporting not injured or hurt. Pt states that her RLE has been feeling weaker, especially her knee - is going to follow up with her orthopedic surgeon at the end of the month.    Personal Factors and Comorbidities  Comorbidity 2;Past/Current Experience  Comorbidities  OSA, GERD    Examination-Activity Limitations  Locomotion Level;Transfers;Stairs;Stand    Examination-Participation Restrictions   Community Activity    Stability/Clinical Decision Making  Evolving/Moderate complexity    Rehab Potential  Good    PT Frequency  3x / week   followed by 2x week for an additional 8 weeks   PT Duration  4 weeks   followed by 2x week for an additional 8 weeks   PT Treatment/Interventions  ADLs/Self Care Home Management;Electrical Stimulation;Aquatic Therapy;Gait training;DME Instruction;Stair training;Functional mobility training;Therapeutic activities;Therapeutic exercise;Patient/family education;Neuromuscular re-education;Balance training;Passive range of motion    PT Next Visit Plan  Continue gait with SPC. How did the knee brace work.   NuStep/SciFit for LE strengthening and endurance. continue sit <> stand training, standing balance activites/dynamic gait in // bars. continue balance on foam/compliant surfaces. monitor fatigue.    PT Home Exercise Plan  [id number] plus seated hip ADD pillow squeezes    Consulted and Agree with Plan of Care  Patient;Family member/caregiver       Patient will benefit from skilled therapeutic intervention in order to improve the following deficits and impairments:  Abnormal gait, Decreased activity tolerance, Decreased coordination, Decreased balance, Decreased endurance, Decreased strength, Difficulty walking, Decreased mobility  Visit Diagnosis: Muscle weakness (generalized)  Other abnormalities of gait and mobility  Difficulty in walking, not elsewhere classified     Problem List Patient Active Problem List   Diagnosis Date Noted  . Overactive bladder 03/28/2019  . Guillain Barr syndrome ([place]) 01/19/2019  . Cervical myofascial pain syndrome 12/16/2018  . Carpal tunnel syndrome 12/16/2018  . Axonal GBS (Guillain-Barre syndrome) ([place]) 12/15/2018  . Leg weakness, bilateral 12/08/2018  . IBS (irritable bowel syndrome) 12/08/2018  . GERD (gastroesophageal reflux disease) 09/14/2018  . 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 Abdominal  pain, epigastric 06/03/2017  . Constipation 01/27/2016  . History of adenomatous polyp of colon 01/27/2016    Arliss Journey, PT ,DPT  03/29/2019, 2:55 PM  [facility] [address] [place], [place], [address] Phone: [phone]   Fax:  [phone]  Name: Paula Sutton MRN: [id number] Date of Birth: 06/04/1955

## 2019-04-02 ENCOUNTER — Other Ambulatory Visit: Payer: Self-pay

## 2019-04-02 ENCOUNTER — Ambulatory Visit: Payer: 59 | Admitting: Physical Therapy

## 2019-04-02 DIAGNOSIS — M6281 Muscle weakness (generalized): Secondary | ICD-10-CM | POA: Diagnosis not present

## 2019-04-02 DIAGNOSIS — R2689 Other abnormalities of gait and mobility: Secondary | ICD-10-CM

## 2019-04-02 DIAGNOSIS — R262 Difficulty in walking, not elsewhere classified: Secondary | ICD-10-CM

## 2019-04-02 NOTE — Therapy (Signed)
[facility] [address] [place], [place], [address] Phone: [phone]   Fax:  [phone]  Physical Therapy Treatment  Patient Details  Name: Paula Sutton MRN: [id number] Date of Birth: December 07, 1955 Referring Provider (PT): Courtney Heys, MD   Encounter Date: 04/02/2019  PT End of Session - 04/02/19 1449    Visit Number  19    Number of Visits  28    Date for PT Re-Evaluation  04/26/19    Authorization Type  [employer], Medicare Secondary    PT Start Time  1400    PT Stop Time  1445    PT Time Calculation (min)  45 min    Equipment Utilized During Treatment  Gait belt    Activity Tolerance  Patient tolerated treatment well    Behavior During Therapy  [facility] for tasks assessed/performed       Past Medical History:  Diagnosis Date  . Arthritis    knee  . Dyslipidemia   . GERD (gastroesophageal reflux disease)   . Left anterior fascicular block    PATIENT SAYS THIS SHOWED ON EKG DURING COLONOSCOPY , WAS REFERRED TO CARDIOLOGY DR. Minus Breeding  (SEE EPIC ENCOUNTER 09-2017)       Past Surgical History:  Procedure Laterality Date  . ABDOMINAL HYSTERECTOMY    . BIOPSY  08/09/2017   Procedure: BIOPSY;  Surgeon: Danie Binder, MD;  [facility];  Service: Endoscopy;;  gastric biopsy for h pylori  . CARPAL TUNNEL RELEASE Right 12/07/2018  . CATARACT EXTRACTION, BILATERAL     DID 1 YEAR APART , LAST ONE WAS 12-2017  . COLONOSCOPY WITH ESOPHAGOGASTRODUODENOSCOPY (EGD)  2014   [place] PROPOFOL N/A 08/09/2017   3 simple adenomas, diverticulosis in recto-sigmoid, sigmoid, and descending colin, moderate external and internal hemorrhoids  . ESOPHAGOGASTRODUODENOSCOPY (EGD) WITH PROPOFOL N/A 08/09/2017   Low-grade narrowing Schatzki ring due to GERD; small hiatal hernia; mild gastritis and duodenitis due to NSAIDs; biopsy with gastritis  . KNEE ARTHROSCOPY Right 11/2017   WITH DR Wynelle Link  AT [place] Right   . POLYPECTOMY  08/09/2017   Procedure: POLYPECTOMY;  Surgeon: Danie Binder, MD;  [facility];  Service: Endoscopy;;  cecal polyp hs, transverse colon polyp hs, rectal polyp cs  . TOTAL KNEE REVISION Right 03/01/2018   Procedure: RIGHT TOTAL KNEE REVISION;  Surgeon: Gaynelle Arabian, MD;  [facility]: [facility];  Service: Orthopedics;  Laterality: Right;  140mn    There were no vitals filed for this visit.  Subjective Assessment - 04/02/19 1401    Subjective  Reports R hip is just feeling more tight today. Just been using RW at home, has not tried the cane. Stood for a while yesterday at [employer]    Pertinent History  GBS (diagnosed 9/20) history of OSA, GERD, recent CTR right hand    How long can you walk comfortably?  10-15 minutes or 260'    Diagnostic tests  MRI - had imaging done on brain last week, waiting for results -Had  a nerve conduction test last week: 50% on L, 25% on R    Patient Stated Goals  wants to walk completely without a RW    Currently in Pain?  Yes    Pain Score  2     Pain Location  Leg    Pain Orientation  Right    Pain Descriptors / Indicators  --  right thigh/hip                      [facility] Adult PT Treatment/Exercise - 04/02/19 0001      Transfers   Transfers  Sit to Stand;Stand to Sit    Sit to Stand  5: Supervision;With upper extremity assist;From elevated surface    Stand to Sit  5: Supervision;Without upper extremity assist;To elevated surface    Comments  Standing at edge of elevated mat: 2 x 10 reps sit <> stands with green theraband around distal thighs for cues for hip ABD, pushing off with BUE to stand, standing for balance without UE support, lowering down to mat table with no UE. At edge of mat table holding onto chair: 3 x 5 reps mini squats, cues for technique. Seated rest break between each rep.       Ambulation/Gait   Ambulation/Gait  Yes    Ambulation/Gait  Assistance  5: Supervision    Ambulation/Gait Assistance Details  into and out of clinic with RW    Ambulation Distance (Feet)  115 Feet   x2   Assistive device  Rolling walker    Gait Pattern  Step-through pattern;Decreased hip/knee flexion - right;Decreased hip/knee flexion - left    Ambulation Surface  Indoor;Level      Therapeutic Activites    Therapeutic Activities  Other Therapeutic Activities    Other Therapeutic Activities  Trialed steppers vs. seated peddle bicycle in clinic again. Pt preferred seated stepper with band resistance -printed out picture of stepper that worked well for pt, and discussed to look on [place]       Exercises   Exercises  Other Exercises    Other Exercises   In // bars with BUE support: 3 x 5 reps keeping RLE on 4" step and stepping up with LLE, same exercise 1 x  10 reps with LLE. Seated break between each set               PT Short Term Goals - 03/28/19 1408      PT SHORT TERM GOAL #1   Title  Patient will be independent with ongoing HEP in order to build upon functional gains in therapy. ALL STGS DUE 03/27/19    Baseline  pt is independent with HEP    Time  4    Period  Weeks    Status  Achieved    Target Date  03/27/19      PT SHORT TERM GOAL #2   Title  Patient will increase BERG score to at least 40/56 in order to demo decr fall risk.    Baseline  42/56 on 03/28/19    Time  4    Period  Weeks    Status  Achieved      PT SHORT TERM GOAL #3   Title  Patient will decrease 5 times sit <> stand score to at least 22 seconds with single UE support in order to improve functional LE strength.    Baseline  22 seconds with BUE support from standard arm chair on 03/28/19    Time  4    Period  Weeks    Status  Not Met      PT SHORT TERM GOAL #4   Title  Patient will ambulate at least 230' over outdoor surfaces with RW and supervision in order to improve endurance for gait and community mobility.    Baseline  pt ambulated into clinic from car  with RW with mod I and out back to car with RW with supervision.    Time  4    Period  Weeks    Status  Achieved      PT SHORT TERM GOAL #5   Title  Patient will increase gait speed to at least 2.3 ft/sec in order to decrease risk of falls.    Baseline  1.75 ft/sec with SPC, 1.90 with RW on 03/28/19    Time  4    Period  Weeks    Status  Not Met      PT SHORT TERM GOAL #6   Title  Patient will demonstrate proper technique to pick up an object from the floor at least 5 reps with supervision and no LOB.    Baseline  3 reps - pt dependent on LLE for weight shift    Time  4    Period  Weeks    Status  New        PT Long Term Goals - 03/29/19 1454      PT LONG TERM GOAL #1   Title  Patient will be independent with final HEP in order to build upon functional gains in therapy. ALL LTGS DUE 04/26/19    Time  12    Period  Weeks    Status  New      PT LONG TERM GOAL #2   Title  Patient will increase baseline BERG score to at least 44/56 in order to demo decr fall risk.    Baseline  42/56    Time  12    Period  Weeks    Status  Revised      PT LONG TERM GOAL #3   Title  Patient will ambulate at least 300' outdoors over unlevel surfaces with LRAD and supervision in order to improve community mobility.    Time  12    Period  Weeks    Status  New      PT LONG TERM GOAL #4   Title  Patient will perform TUG in 18 seconds or less with LRAD in order to demo decreased fall risk.    Time  12    Period  Weeks    Status  New      PT LONG TERM GOAL #5   Title  Patient will perform 5x sit <> stand in 18 seconds or less without BUE support in order to demo improved LE strength.    Baseline  22 seconds with BUE support from arm chair    Time  12    Period  Weeks    Status  Revised      PT LONG TERM GOAL #6   Title  Patient will improve gait speed to at least 2.2 ft/sec using LRAD in order to demo decreased fall risk and improved community mobility.    Time  12    Period  Weeks     Status  New            Plan - 04/02/19 1451    Clinical Impression Statement  Focus of today's session was LE strengthening - with sit <> stands and step ups. Pt tolerated session well - needed frequent seated rest breaks due to fatigue. Ambulated into session and throughout session with RW today. Will continue to progress towards LTGs.    Personal Factors and Comorbidities  Comorbidity 2;Past/Current Experience    Comorbidities  OSA, GERD    Examination-Activity Limitations  Locomotion Level;Transfers;Stairs;Stand    Examination-Participation Restrictions  Community Activity    Stability/Clinical Decision Making  Evolving/Moderate complexity    Rehab Potential  Good    PT Frequency  3x / week   followed by 2x week for an additional 8 weeks   PT Duration  4 weeks   followed by 2x week for an additional 8 weeks   PT Treatment/Interventions  ADLs/Self Care Home Management;Electrical Stimulation;Aquatic Therapy;Gait training;DME Instruction;Stair training;Functional mobility training;Therapeutic activities;Therapeutic exercise;Patient/family education;Neuromuscular re-education;Balance training;Passive range of motion    PT Next Visit Plan  knee brace for RLE? NuStep/SciFit for LE strengthening and endurance. supine strengthening. continue sit <> stand training, standing balance activites/dynamic gait in // bars. continue balance on foam/compliant surfaces. monitor fatigue.    PT Home Exercise Plan  [id number] plus seated hip ADD pillow squeezes    Consulted and Agree with Plan of Care  Patient;Family member/caregiver       Patient will benefit from skilled therapeutic intervention in order to improve the following deficits and impairments:  Abnormal gait, Decreased activity tolerance, Decreased coordination, Decreased balance, Decreased endurance, Decreased strength, Difficulty walking, Decreased mobility  Visit Diagnosis: Muscle weakness (generalized)  Other abnormalities of gait and  mobility  Difficulty in walking, not elsewhere classified     Problem List Patient Active Problem List   Diagnosis Date Noted  . Overactive bladder 03/28/2019  . Guillain Barr syndrome ([place]) 01/19/2019  . Cervical myofascial pain syndrome 12/16/2018  . Carpal tunnel syndrome 12/16/2018  . 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 Leg weakness, bilateral 12/08/2018  . IBS (irritable bowel syndrome) 12/08/2018  . GERD (gastroesophageal reflux disease) 09/14/2018  . 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 Abdominal pain, epigastric 06/03/2017  . Constipation 01/27/2016  . History of adenomatous polyp of colon 01/27/2016    Arliss Journey, PT, DPT  04/02/2019, 3:02 PM  [facility] [address] [place], [place], [address] Phone: [phone]   Fax:  [phone]  Name: CRYTAL PENSINGER MRN: [id number] Date of Birth: 06/23/1955

## 2019-04-04 ENCOUNTER — Ambulatory Visit: Payer: 59 | Admitting: Physical Therapy

## 2019-04-04 ENCOUNTER — Other Ambulatory Visit: Payer: Self-pay

## 2019-04-04 DIAGNOSIS — M6281 Muscle weakness (generalized): Secondary | ICD-10-CM | POA: Diagnosis not present

## 2019-04-04 DIAGNOSIS — R2689 Other abnormalities of gait and mobility: Secondary | ICD-10-CM

## 2019-04-04 DIAGNOSIS — R262 Difficulty in walking, not elsewhere classified: Secondary | ICD-10-CM

## 2019-04-04 DIAGNOSIS — R29818 Other symptoms and signs involving the nervous system: Secondary | ICD-10-CM

## 2019-04-04 NOTE — Therapy (Signed)
[facility] [address] [place], [place], [address] Phone: [phone]   Fax:  [phone]  Physical Therapy Treatment/10th Visit Progress Note  Patient Details  Name: Paula Sutton MRN: [id number] Date of Birth: [birth year] Referring Provider (PT): Courtney Heys, MD   Progress Note Reporting Period 03/05/19 to 04/04/19   See note below for Objective Data and Assessment of Progress/Goals.      Encounter Date: 04/04/2019  PT End of Session - 04/04/19 1551    Visit Number  20    Number of Visits  28    Date for PT Re-Evaluation  04/26/19    Authorization Type  [employer], Medicare Secondary    PT Start Time  8088    PT Stop Time  1446    PT Time Calculation (min)  41 min    Activity Tolerance  Patient tolerated treatment well    Behavior During Therapy  W[facility] for tasks assessed/performed       Past Medical History:  Diagnosis Date  . Arthritis    knee  . Dyslipidemia   . GERD (gastroesophageal reflux disease)   . Left anterior fascicular block    PATIENT SAYS THIS SHOWED ON EKG DURING COLONOSCOPY , WAS REFERRED TO CARDIOLOGY DR. Minus Breeding  (SEE EPIC ENCOUNTER 09-2017)       Past Surgical History:  Procedure Laterality Date  . ABDOMINAL HYSTERECTOMY    . BIOPSY  08/09/2017   Procedure: BIOPSY;  Surgeon: Danie Binder, MD;  [facility];  Service: Endoscopy;;  gastric biopsy for h pylori  . CARPAL TUNNEL RELEASE Right 12/07/2018  . CATARACT EXTRACTION, BILATERAL     DID 1 YEAR APART , LAST ONE WAS 12-2017  . COLONOSCOPY WITH ESOPHAGOGASTRODUODENOSCOPY (EGD)  2014   [place] PROPOFOL N/A 08/09/2017   3 simple adenomas, diverticulosis in recto-sigmoid, sigmoid, and descending colin, moderate external and internal hemorrhoids  . ESOPHAGOGASTRODUODENOSCOPY (EGD) WITH PROPOFOL N/A 08/09/2017   Low-grade narrowing Schatzki ring due to GERD; small hiatal  hernia; mild gastritis and duodenitis due to NSAIDs; biopsy with gastritis  . KNEE ARTHROSCOPY Right 11/2017   WITH DR Wynelle Link  AT [place] Right   . POLYPECTOMY  08/09/2017   Procedure: POLYPECTOMY;  Surgeon: Danie Binder, MD;  [facility];  Service: Endoscopy;;  cecal polyp hs, transverse colon polyp hs, rectal polyp cs  . TOTAL KNEE REVISION Right 03/01/2018   Procedure: RIGHT TOTAL KNEE REVISION;  Surgeon: Gaynelle Arabian, MD;  [facility];  Service: Orthopedics;  Laterality: Right;  181mn    There were no vitals filed for this visit.  Subjective Assessment - 04/04/19 1408    Subjective  Ordered a seated stepper from [place]. Was a little sore yesterday, felt good this morning when she woke up. Feels burning in the R hip, but the stretches are helping - worse in the morning from sleeping.    Pertinent History  GBS (diagnosed 9/20) history of OSA, GERD, recent CTR right hand    How long can you walk comfortably?  10-15 minutes or 260'    Diagnostic tests  MRI - had imaging done on brain last week, waiting for results -Had  a nerve conduction test last week: 50% on L, 25% on R    Patient Stated Goals  wants to walk completely without a RW    Currently in Pain?  Yes  Pain Score  1     Pain Location  Foot   burning B feet   Pain Orientation  Left;Right                       [facility] Adult PT Treatment/Exercise - 04/04/19 1602      Ambulation/Gait   Ambulation/Gait  Yes    Ambulation/Gait Assistance  5: Supervision    Ambulation/Gait Assistance Details  in and out of clinic with RW    Ambulation Distance (Feet)  115 Feet    Assistive device  Rolling walker    Gait Pattern  Step-through pattern;Decreased hip/knee flexion - right;Decreased hip/knee flexion - left      Exercises   Other Exercises   Gave pt print out of potential knee brace to purchase for RLE to help assist with stability before pt goes to see orthopedic surgeon.   Hooklying yoga block squeezes for hip ADD 1  x 10 reps - cues for technique, bridging with hip ADD block squeeze 2 x 5 reps - cues for breathing throughout, 1 x 10 reps quad sets on RLE, pt with increased difficulty performing and compensating with R hamstring and hip extensor activation, 2 x 7 reps B clamshells with green theraband - cues to prevent hips from rotating when performing, prone on mat: 2 x 7 reps hamstring curls with 1lb ankle weight               PT Short Term Goals - 03/28/19 1408      PT SHORT TERM GOAL #1   Title  Patient will be independent with ongoing HEP in order to build upon functional gains in therapy. ALL STGS DUE 03/27/19    Baseline  pt is independent with HEP    Time  4    Period  Weeks    Status  Achieved    Target Date  03/27/19      PT SHORT TERM GOAL #2   Title  Patient will increase BERG score to at least 40/56 in order to demo decr fall risk.    Baseline  42/56 on 03/28/19    Time  4    Period  Weeks    Status  Achieved      PT SHORT TERM GOAL #3   Title  Patient will decrease 5 times sit <> stand score to at least 22 seconds with single UE support in order to improve functional LE strength.    Baseline  22 seconds with BUE support from standard arm chair on 03/28/19    Time  4    Period  Weeks    Status  Not Met      PT SHORT TERM GOAL #4   Title  Patient will ambulate at least 230' over outdoor surfaces with RW and supervision in order to improve endurance for gait and community mobility.    Baseline  pt ambulated into clinic from car with RW with mod I and out back to car with RW with supervision.    Time  4    Period  Weeks    Status  Achieved      PT SHORT TERM GOAL #5   Title  Patient will increase gait speed to at least 2.3 ft/sec in order to decrease risk of falls.    Baseline  1.75 ft/sec with SPC, 1.90 with RW on 03/28/19    Time  4    Period  Weeks  Status  Not Met      PT SHORT TERM GOAL #6   Title  Patient will  demonstrate proper technique to pick up an object from the floor at least 5 reps with supervision and no LOB.    Baseline  3 reps - pt dependent on LLE for weight shift    Time  4    Period  Weeks    Status  New        PT Long Term Goals - 03/29/19 1454      PT LONG TERM GOAL #1   Title  Patient will be independent with final HEP in order to build upon functional gains in therapy. ALL LTGS DUE 04/26/19    Time  12    Period  Weeks    Status  New      PT LONG TERM GOAL #2   Title  Patient will increase baseline BERG score to at least 44/56 in order to demo decr fall risk.    Baseline  42/56    Time  12    Period  Weeks    Status  Revised      PT LONG TERM GOAL #3   Title  Patient will ambulate at least 300' outdoors over unlevel surfaces with LRAD and supervision in order to improve community mobility.    Time  12    Period  Weeks    Status  New      PT LONG TERM GOAL #4   Title  Patient will perform TUG in 18 seconds or less with LRAD in order to demo decreased fall risk.    Time  12    Period  Weeks    Status  New      PT LONG TERM GOAL #5   Title  Patient will perform 5x sit <> stand in 18 seconds or less without BUE support in order to demo improved LE strength.    Baseline  22 seconds with BUE support from arm chair    Time  12    Period  Weeks    Status  Revised      PT LONG TERM GOAL #6   Title  Patient will improve gait speed to at least 2.2 ft/sec using LRAD in order to demo decreased fall risk and improved community mobility.    Time  12    Period  Weeks    Status  New            Plan - 04/04/19 1557    Clinical Impression Statement  10th visit progress note: When assessed on 03/28/19 - pt has achieved 3 out of 5 STGs in regards to HEP, BERG score, and ambulating over outdoor surfaces with RW. Pt improved her BERG score to a 42/56 (previously was 37/56)- indicating pt is at a significant fall risk. Pt did not meet goal in regards to 5x sit <> stand  score and gait speed. Pt continues to demonstrated increased hip ADD when performing sit <> stands. Pt's gait speed with SPC of 1.75 ft/sec indicates recurrent fall risk and gait with RW indicates limited community ambulator. Focus of today's skilled session focused on LE strengthening - pt tolerated well. Rest breaks taken throughout session to monitor fatigue. Pt continues to have R hip flexor and quadriceps weakness. Pt is progressing well - will continue to progress towards LTGs.    Personal Factors and Comorbidities  Comorbidity 2;Past/Current Experience    Comorbidities  OSA, GERD  Examination-Activity Limitations  Locomotion Level;Transfers;Stairs;Stand    Examination-Participation Restrictions  Community Activity    Stability/Clinical Decision Making  Evolving/Moderate complexity    Rehab Potential  Good    PT Frequency  3x / week   followed by 2x week for an additional 8 weeks   PT Duration  4 weeks   followed by 2x week for an additional 8 weeks   PT Treatment/Interventions  ADLs/Self Care Home Management;Electrical Stimulation;Aquatic Therapy;Gait training;DME Instruction;Stair training;Functional mobility training;Therapeutic activities;Therapeutic exercise;Patient/family education;Neuromuscular re-education;Balance training;Passive range of motion    PT Next Visit Plan  step ups on small step. NuStep/SciFit for LE strengthening and endurance. supine strengthening. continue sit <> stand training, standing balance activites/dynamic gait in // bars. continue balance on foam/compliant surfaces. monitor fatigue.    PT Home Exercise Plan  [id number] plus seated hip ADD pillow squeezes    Consulted and Agree with Plan of Care  Patient       Patient will benefit from skilled therapeutic intervention in order to improve the following deficits and impairments:  Abnormal gait, Decreased activity tolerance, Decreased coordination, Decreased balance, Decreased endurance, Decreased strength,  Difficulty walking, Decreased mobility  Visit Diagnosis: Other abnormalities of gait and mobility  Muscle weakness (generalized)  Difficulty in walking, not elsewhere classified  Other symptoms and signs involving the nervous system     Problem List Patient Active Problem List   Diagnosis Date Noted  . Overactive bladder 03/28/2019  . Guillain Barr syndrome ([place]) 01/19/2019  . Cervical myofascial pain syndrome 12/16/2018  . Carpal tunnel syndrome 12/16/2018  . Axonal GBS (Guillain-Barre syndrome) ([place]) 12/15/2018  . Leg weakness, bilateral 12/08/2018  . IBS (irritable bowel syndrome) 12/08/2018  . GERD (gastroesophageal reflux disease) 09/14/2018  . 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 Abdominal pain, epigastric 06/03/2017  . Constipation 01/27/2016  . History of adenomatous polyp of colon 01/27/2016    Arliss Journey, PT, DPT  04/04/2019, 4:02 PM  [facility] [address] [place], [place], [address] Phone: [phone]   Fax:  [phone]  Name: LEAHANN LEMPKE MRN: [id number] Date of Birth: Feb 05, 1956

## 2019-04-09 ENCOUNTER — Other Ambulatory Visit: Payer: Self-pay

## 2019-04-09 ENCOUNTER — Ambulatory Visit: Payer: 59 | Admitting: Physical Therapy

## 2019-04-09 DIAGNOSIS — M6281 Muscle weakness (generalized): Secondary | ICD-10-CM | POA: Diagnosis not present

## 2019-04-09 NOTE — Therapy (Signed)
[facility] [address] [place], [place], [address] Phone: [phone]   Fax:  [phone]  Physical Therapy Treatment  Patient Details  Name: Paula Sutton MRN: [id number] Date of Birth: 1955-03-27 Referring Provider (PT): Courtney Heys, MD   Encounter Date: 04/09/2019  PT End of Session - 04/09/19 1505    Visit Number  21    Number of Visits  28    Date for PT Re-Evaluation  04/26/19    Authorization Type  [employer], Medicare Secondary    PT Start Time  1234    PT Stop Time  1314    PT Time Calculation (min)  40 min    Equipment Utilized During Treatment  Gait belt    Activity Tolerance  Patient tolerated treatment well    Behavior During Therapy  [facility] for tasks assessed/performed       Past Medical History:  Diagnosis Date  . Arthritis    knee  . Dyslipidemia   . GERD (gastroesophageal reflux disease)   . Left anterior fascicular block    PATIENT SAYS THIS SHOWED ON EKG DURING COLONOSCOPY , WAS REFERRED TO CARDIOLOGY DR. Minus Breeding  (SEE EPIC ENCOUNTER 09-2017)       Past Surgical History:  Procedure Laterality Date  . ABDOMINAL HYSTERECTOMY    . BIOPSY  08/09/2017   Procedure: BIOPSY;  Surgeon: Danie Binder, MD;  [facility];  Service: Endoscopy;;  gastric biopsy for h pylori  . CARPAL TUNNEL RELEASE Right 12/07/2018  . CATARACT EXTRACTION, BILATERAL     DID 1 YEAR APART , LAST ONE WAS 12-2017  . COLONOSCOPY WITH ESOPHAGOGASTRODUODENOSCOPY (EGD)  2014   [place] PROPOFOL N/A 08/09/2017   3 simple adenomas, diverticulosis in recto-sigmoid, sigmoid, and descending colin, moderate external and internal hemorrhoids  . ESOPHAGOGASTRODUODENOSCOPY (EGD) WITH PROPOFOL N/A 08/09/2017   Low-grade narrowing Schatzki ring due to GERD; small hiatal hernia; mild gastritis and duodenitis due to NSAIDs; biopsy with gastritis  . KNEE ARTHROSCOPY Right 11/2017   WITH DR Wynelle Link  AT [place] Right   . POLYPECTOMY  08/09/2017   Procedure: POLYPECTOMY;  Surgeon: Danie Binder, MD;  [facility];  Service: Endoscopy;;  cecal polyp hs, transverse colon polyp hs, rectal polyp cs  . TOTAL KNEE REVISION Right 03/01/2018   Procedure: RIGHT TOTAL KNEE REVISION;  Surgeon: Gaynelle Arabian, MD;  Location: [facility];  Service: Orthopedics;  Laterality: Right;  150mn    There were no vitals filed for this visit.  Subjective Assessment - 04/09/19 1500    Subjective  Just having a little of the normal Guillan-Barre pain in L hip, not too bad.  Liked doing the step ups last week.  Will see the orthopedic MD on Friday regarding R knee.    Pertinent History  GBS (diagnosed 9/20) history of OSA, GERD, recent CTR right hand    How long can you walk comfortably?  10-15 minutes or 260'    Diagnostic tests  MRI - had imaging done on brain last week, waiting for results -Had  a nerve conduction test last week: 50% on L, 25% on R    Patient Stated Goals  wants to walk completely without a RW    Currently in Pain?  No/denies                       [facility]  Adult PT Treatment/Exercise - 04/09/19 0001      High Level Balance   High Level Balance Activities  Side stepping;Backward walking    High Level Balance Comments  Forward/backward gait in parallel bars x 3 reps with BUE support, then sidestepping R and L x 3 reps each.      Knee/Hip Exercises: Standing   Forward Step Up  Right;Left;Hand Hold: 2;Step Height: 4"   L x 6 reps, R x 4 reps   Other Standing Knee Exercises  Alternating step taps to 4" block x 5 reps each leg.  Functional strengthening in SLS, with 1 foot propped on 4" block:  alternating UE lifts x 10 reps; then head turns/head nods x 5 reps each.  PT provides min guard support.       Lower extremity strengthening on mat:  -Hooklying yoga block squeezes for hip ADD 1  x 10 reps - cues for technique -bridging with  hip ADD block squeeze 2 x 5 reps - cues for breathing throughout -quad sets on RLE x 10 reps, pt with increased difficulty performing and compensating with R hamstring and hip extensor activation -Sidelying bilat clamshells with green theraband 2 sets x 10 reps with rest break between sets - cues to prevent hips from rotating when performing   Sit<>stand x 5 reps from mat surface, minimal/no UE support, difficulty with eccentric control to sit (pt reports she is fearful R knee will buckle) Sit<>stand x 5 reps from elevated mat surface, simulating bed height, with cues for forward lean and slowed squat to sit (easier from this height, but pt still needs cues)    Self Care:  PT Education - 04/09/19 1504    Education Details  Addition of aquatic therapy appointments, what to expect with aquatic therapy (pt to start aquatic therapy 04/11/2019)    Person(s) Educated  Patient    Methods  Explanation;Handout    Comprehension  Verbalized understanding       PT Short Term Goals - 03/28/19 1408      PT SHORT TERM GOAL #1   Title  Patient will be independent with ongoing HEP in order to build upon functional gains in therapy. ALL STGS DUE 03/27/19    Baseline  pt is independent with HEP    Time  4    Period  Weeks    Status  Achieved    Target Date  03/27/19      PT SHORT TERM GOAL #2   Title  Patient will increase BERG score to at least 40/56 in order to demo decr fall risk.    Baseline  42/56 on 03/28/19    Time  4    Period  Weeks    Status  Achieved      PT SHORT TERM GOAL #3   Title  Patient will decrease 5 times sit <> stand score to at least 22 seconds with single UE support in order to improve functional LE strength.    Baseline  22 seconds with BUE support from standard arm chair on 03/28/19    Time  4    Period  Weeks    Status  Not Met      PT SHORT TERM GOAL #4   Title  Patient will ambulate at least 230' over outdoor surfaces with RW and supervision in order to improve  endurance for gait and community mobility.    Baseline  pt ambulated into clinic from car with RW with mod I and  out back to car with RW with supervision.    Time  4    Period  Weeks    Status  Achieved      PT SHORT TERM GOAL #5   Title  Patient will increase gait speed to at least 2.3 ft/sec in order to decrease risk of falls.    Baseline  1.75 ft/sec with SPC, 1.90 with RW on 03/28/19    Time  4    Period  Weeks    Status  Not Met      PT SHORT TERM GOAL #6   Title  Patient will demonstrate proper technique to pick up an object from the floor at least 5 reps with supervision and no LOB.    Baseline  3 reps - pt dependent on LLE for weight shift    Time  4    Period  Weeks    Status  New        PT Long Term Goals - 03/29/19 1454      PT LONG TERM GOAL #1   Title  Patient will be independent with final HEP in order to build upon functional gains in therapy. ALL LTGS DUE 04/26/19    Time  12    Period  Weeks    Status  New      PT LONG TERM GOAL #2   Title  Patient will increase baseline BERG score to at least 44/56 in order to demo decr fall risk.    Baseline  42/56    Time  12    Period  Weeks    Status  Revised      PT LONG TERM GOAL #3   Title  Patient will ambulate at least 300' outdoors over unlevel surfaces with LRAD and supervision in order to improve community mobility.    Time  12    Period  Weeks    Status  New      PT LONG TERM GOAL #4   Title  Patient will perform TUG in 18 seconds or less with LRAD in order to demo decreased fall risk.    Time  12    Period  Weeks    Status  New      PT LONG TERM GOAL #5   Title  Patient will perform 5x sit <> stand in 18 seconds or less without BUE support in order to demo improved LE strength.    Baseline  22 seconds with BUE support from arm chair    Time  12    Period  Weeks    Status  Revised      PT LONG TERM GOAL #6   Title  Patient will improve gait speed to at least 2.2 ft/sec using LRAD in order to demo  decreased fall risk and improved community mobility.    Time  12    Period  Weeks    Status  New            Plan - 04/09/19 1505    Clinical Impression Statement  Pt performed/reviewed supine and sidelying strengthening exercises and progressed to standing strengthening/balance exercises in parallel bars.  Educated pt on plans for starting aquatic therapy this week, per primary therapist indication.  Pt will continue to benefit from skilled PT to address strenght, balance, gait deficits.    Personal Factors and Comorbidities  Comorbidity 2;Past/Current Experience    Comorbidities  OSA, GERD    Examination-Activity Limitations  Locomotion Level;Transfers;Stairs;Stand  Examination-Participation Restrictions  Community Activity    Stability/Clinical Decision Making  Evolving/Moderate complexity    Rehab Potential  Good    PT Frequency  3x / week   followed by 2x week for an additional 8 weeks   PT Duration  4 weeks   followed by 2x week for an additional 8 weeks   PT Treatment/Interventions  ADLs/Self Care Home Management;Electrical Stimulation;Aquatic Therapy;Gait training;DME Instruction;Stair training;Functional mobility training;Therapeutic activities;Therapeutic exercise;Patient/family education;Neuromuscular re-education;Balance training;Passive range of motion    PT Next Visit Plan  Pt to start aquatic therapy 04/11/2019.  Step ups/step taps on 4" step. NuStep/SciFit for LE strengthening and endurance. supine strengthening. continue sit <> stand training, standing balance activites/dynamic gait in // bars. continue balance on foam/compliant surfaces. monitor fatigue.    PT Home Exercise Plan  [id number] plus seated hip ADD pillow squeezes    Consulted and Agree with Plan of Care  Patient       Patient will benefit from skilled therapeutic intervention in order to improve the following deficits and impairments:  Abnormal gait, Decreased activity tolerance, Decreased coordination,  Decreased balance, Decreased endurance, Decreased strength, Difficulty walking, Decreased mobility  Visit Diagnosis: Muscle weakness (generalized)     Problem List Patient Active Problem List   Diagnosis Date Noted  . Overactive bladder 03/28/2019  . Guillain Barr syndrome ([place]) 01/19/2019  . Cervical myofascial pain syndrome 12/16/2018  . Carpal tunnel syndrome 12/16/2018  . 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 Leg weakness, bilateral 12/08/2018  . IBS (irritable bowel syndrome) 12/08/2018  . GERD (gastroesophageal reflux disease) 09/14/2018  . 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 Abdominal pain, epigastric 06/03/2017  . Constipation 01/27/2016  . History of adenomatous polyp of colon 01/27/2016    MARRIOTT,AMY W. 04/09/2019, 4:03 PM  Frazier Butt., PT   [facility] [address] [place], [place], [address] Phone: [phone]   Fax:  [phone]  Name: Paula Sutton MRN: [id number] Date of Birth: [birth year]

## 2019-04-11 ENCOUNTER — Encounter: Payer: Self-pay | Admitting: Physical Therapy

## 2019-04-11 ENCOUNTER — Ambulatory Visit: Payer: 59 | Admitting: Physical Therapy

## 2019-04-11 ENCOUNTER — Other Ambulatory Visit: Payer: Self-pay

## 2019-04-11 DIAGNOSIS — R262 Difficulty in walking, not elsewhere classified: Secondary | ICD-10-CM

## 2019-04-11 DIAGNOSIS — M6281 Muscle weakness (generalized): Secondary | ICD-10-CM | POA: Diagnosis not present

## 2019-04-11 DIAGNOSIS — R2689 Other abnormalities of gait and mobility: Secondary | ICD-10-CM

## 2019-04-11 NOTE — Therapy (Signed)
[facility] [address] [place], [place], [address] Phone: [phone]   Fax:  [phone]  Physical Therapy Treatment  Patient Details  Name: Paula Sutton MRN: [id number] Date of Birth: 02-11-1956 Referring Provider (PT): Courtney Heys, MD   Encounter Date: 04/11/2019  PT End of Session - 04/11/19 1428    Visit Number  22    Number of Visits  28    Date for PT Re-Evaluation  04/26/19    Authorization Type  [employer], Medicare Secondary    PT Start Time  1325    PT Stop Time  1410    PT Time Calculation (min)  45 min    Equipment Utilized During Treatment  Other (comment)   pool noodle   Activity Tolerance  Patient tolerated treatment well    Behavior During Therapy  [facility] for tasks assessed/performed       Past Medical History:  Diagnosis Date  . Arthritis    knee  . Dyslipidemia   . GERD (gastroesophageal reflux disease)   . Left anterior fascicular block    PATIENT SAYS THIS SHOWED ON EKG DURING COLONOSCOPY , WAS REFERRED TO CARDIOLOGY DR. Minus Breeding  (SEE EPIC ENCOUNTER 09-2017)       Past Surgical History:  Procedure Laterality Date  . ABDOMINAL HYSTERECTOMY    . BIOPSY  08/09/2017   Procedure: BIOPSY;  Surgeon: Danie Binder, MD;  [facility];  Service: Endoscopy;;  gastric biopsy for h pylori  . CARPAL TUNNEL RELEASE Right 12/07/2018  . CATARACT EXTRACTION, BILATERAL     DID 1 YEAR APART , LAST ONE WAS 12-2017  . COLONOSCOPY WITH ESOPHAGOGASTRODUODENOSCOPY (EGD)  2014   [place] PROPOFOL N/A 08/09/2017   3 simple adenomas, diverticulosis in recto-sigmoid, sigmoid, and descending colin, moderate external and internal hemorrhoids  . ESOPHAGOGASTRODUODENOSCOPY (EGD) WITH PROPOFOL N/A 08/09/2017   Low-grade narrowing Schatzki ring due to GERD; small hiatal hernia; mild gastritis and duodenitis due to NSAIDs; biopsy with gastritis  . KNEE  ARTHROSCOPY Right 11/2017   WITH DR Wynelle Link  AT [place] Right   . POLYPECTOMY  08/09/2017   Procedure: POLYPECTOMY;  Surgeon: Danie Binder, MD;  [facility];  Service: Endoscopy;;  cecal polyp hs, transverse colon polyp hs, rectal polyp cs  . TOTAL KNEE REVISION Right 03/01/2018   Procedure: RIGHT TOTAL KNEE REVISION;  Surgeon: Gaynelle Arabian, MD;  [facility];  Service: Orthopedics;  Laterality: Right;  126mn    There were no vitals filed for this visit.  Subjective Assessment - 04/11/19 1426    Subjective  Denies any pain or changes/falls.  Going to see MD Friday about her knee "clicking".    Patient is accompained by:  Family member   husband   Pertinent History  GBS (diagnosed 9/20) history of OSA, GERD, recent CTR right hand    How long can you walk comfortably?  10-15 minutes or 260'    Diagnostic tests  MRI - had imaging done on brain last week, waiting for results -Had  a nerve conduction test last week: 50% on L, 25% on R    Patient Stated Goals  wants to walk completely without a RW    Currently in Pain?  No/denies        [facility] temp 86.7 degrees  Patient seen for aquatic therapy today.  Treatment took  place in water 3.5-4 feet deep depending upon activity.  Pt entered and exited the pool via step negotiation step by step sequence with use of bil. hand rails with supervision:    Runner's stretch each leg 30 sec hold at beginning of session for hamstring & gastroc stretch; and feet up wall for 30 second for gastroc  Pt performed gait training in pool with bil. UE support on pool noodle forwards 46mx 2 reps with PTA stabilizing noodle then 236mith pt stabilizing noodle.  Performed side stepping holding pool noodle x 2575m2 with PTA assisting with noodle, 45m13mkwards with PTA stabilizing noodle.  Pt performed standing hip flexion, hamstring curl and abduction 10 reps each leg alternating LE's.  Pt  needing cues for technique and bil UE support of pool edge.  Squats 10 reps with pt holding onto side of pool; unilateral squats 5 reps each leg with pt holding onto pool edge with bil. UE support  Seated on pool bench for 10 reps sit<>stand without UE support with cues for forward lean and to avoid pushing legs back against pool seat.  Pt requires buoyancy for support with balance and viscosity for resistance for strengthening exercises; buoyancy is also needed for off loading body to assist with initiation of hip and knee flexion;    Discussed use of buoyancy cuffs next session as well as their purpose/benefit.  Also discussed adding additional aquatic appointments as per PT POC.    PT Education - 04/11/19 1427    Education Details  scheduling addiltional aquatic sessions    Person(s) Educated  Patient    Comprehension  Verbalized understanding       PT Short Term Goals - 03/28/19 1408      PT SHORT TERM GOAL #1   Title  Patient will be independent with ongoing HEP in order to build upon functional gains in therapy. ALL STGS DUE 03/27/19    Baseline  pt is independent with HEP    Time  4    Period  Weeks    Status  Achieved    Target Date  03/27/19      PT SHORT TERM GOAL #2   Title  Patient will increase BERG score to at least 40/56 in order to demo decr fall risk.    Baseline  42/56 on 03/28/19    Time  4    Period  Weeks    Status  Achieved      PT SHORT TERM GOAL #3   Title  Patient will decrease 5 times sit <> stand score to at least 22 seconds with single UE support in order to improve functional LE strength.    Baseline  22 seconds with BUE support from standard arm chair on 03/28/19    Time  4    Period  Weeks    Status  Not Met      PT SHORT TERM GOAL #4   Title  Patient will ambulate at least 230' over outdoor surfaces with RW and supervision in order to improve endurance for gait and community mobility.    Baseline  pt ambulated into clinic from car with RW  with mod I and out back to car with RW with supervision.    Time  4    Period  Weeks    Status  Achieved      PT SHORT TERM GOAL #5   Title  Patient will increase gait speed to at least 2.3 ft/sec  in order to decrease risk of falls.    Baseline  1.75 ft/sec with SPC, 1.90 with RW on 03/28/19    Time  4    Period  Weeks    Status  Not Met      PT SHORT TERM GOAL #6   Title  Patient will demonstrate proper technique to pick up an object from the floor at least 5 reps with supervision and no LOB.    Baseline  3 reps - pt dependent on LLE for weight shift    Time  4    Period  Weeks    Status  New        PT Long Term Goals - 03/29/19 1454      PT LONG TERM GOAL #1   Title  Patient will be independent with final HEP in order to build upon functional gains in therapy. ALL LTGS DUE 04/26/19    Time  12    Period  Weeks    Status  New      PT LONG TERM GOAL #2   Title  Patient will increase baseline BERG score to at least 44/56 in order to demo decr fall risk.    Baseline  42/56    Time  12    Period  Weeks    Status  Revised      PT LONG TERM GOAL #3   Title  Patient will ambulate at least 300' outdoors over unlevel surfaces with LRAD and supervision in order to improve community mobility.    Time  12    Period  Weeks    Status  New      PT LONG TERM GOAL #4   Title  Patient will perform TUG in 18 seconds or less with LRAD in order to demo decreased fall risk.    Time  12    Period  Weeks    Status  New      PT LONG TERM GOAL #5   Title  Patient will perform 5x sit <> stand in 18 seconds or less without BUE support in order to demo improved LE strength.    Baseline  22 seconds with BUE support from arm chair    Time  12    Period  Weeks    Status  Revised      PT LONG TERM GOAL #6   Title  Patient will improve gait speed to at least 2.2 ft/sec using LRAD in order to demo decreased fall risk and improved community mobility.    Time  12    Period  Weeks    Status  New             Plan - 04/11/19 1429    Clinical Impression Statement  Today was pt's first aquatic session.  Tolerated session well with minimal rest breaks needed.  Pt needing pool noodle/pool edge for support most of session.  Continue PT per POC.    Personal Factors and Comorbidities  Comorbidity 2;Past/Current Experience    Comorbidities  OSA, GERD    Examination-Activity Limitations  Locomotion Level;Transfers;Stairs;Stand    Examination-Participation Restrictions  Community Activity    Stability/Clinical Decision Making  Evolving/Moderate complexity    Rehab Potential  Good    PT Frequency  3x / week   followed by 2x week for an additional 8 weeks   PT Duration  4 weeks   followed by 2x week for an additional 8 weeks   PT Treatment/Interventions  ADLs/Self Care Home Management;Electrical Stimulation;Aquatic Therapy;Gait training;DME Instruction;Stair training;Functional mobility training;Therapeutic activities;Therapeutic exercise;Patient/family education;Neuromuscular re-education;Balance training;Passive range of motion    PT Next Visit Plan  Step ups/step taps on 4" step. NuStep/SciFit for LE strengthening and endurance. supine strengthening. continue sit <> stand training, standing balance activites/dynamic gait in // bars. continue balance on foam/compliant surfaces. monitor fatigue.    PT Home Exercise Plan  [id number] plus seated hip ADD pillow squeezes    Consulted and Agree with Plan of Care  Patient       Patient will benefit from skilled therapeutic intervention in order to improve the following deficits and impairments:  Abnormal gait, Decreased activity tolerance, Decreased coordination, Decreased balance, Decreased endurance, Decreased strength, Difficulty walking, Decreased mobility  Visit Diagnosis: Muscle weakness (generalized)  Other abnormalities of gait and mobility  Difficulty in walking, not elsewhere classified     Problem List Patient Active Problem  List   Diagnosis Date Noted  . Overactive bladder 03/28/2019  . Guillain Barr syndrome ([place]) 01/19/2019  . Cervical myofascial pain syndrome 12/16/2018  . Carpal tunnel syndrome 12/16/2018  . Axonal GBS (Guillain-Barre syndrome) ([place]) 12/15/2018  . Leg weakness, bilateral 12/08/2018  . IBS (irritable bowel syndrome) 12/08/2018  . GERD (gastroesophageal reflux disease) 09/14/2018  . 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 Abdominal pain, epigastric 06/03/2017  . Constipation 01/27/2016  . History of adenomatous polyp of colon 01/27/2016    Narda Bonds, PTA [facility] 04/11/19 2:39 PM Phone: [phone] Fax: [facility] [address] [place], [place], [address] Phone: [phone]   Fax:  [phone]  Name: Paula Sutton MRN: [id number] Date of Birth: [birth year]

## 2019-04-16 ENCOUNTER — Ambulatory Visit: Payer: Medicare Other | Attending: Physical Medicine and Rehabilitation | Admitting: Physical Therapy

## 2019-04-16 ENCOUNTER — Other Ambulatory Visit: Payer: Self-pay

## 2019-04-16 DIAGNOSIS — R262 Difficulty in walking, not elsewhere classified: Secondary | ICD-10-CM | POA: Diagnosis not present

## 2019-04-16 DIAGNOSIS — R29818 Other symptoms and signs involving the nervous system: Secondary | ICD-10-CM | POA: Diagnosis not present

## 2019-04-16 DIAGNOSIS — M6281 Muscle weakness (generalized): Secondary | ICD-10-CM | POA: Diagnosis not present

## 2019-04-16 DIAGNOSIS — R2689 Other abnormalities of gait and mobility: Secondary | ICD-10-CM | POA: Insufficient documentation

## 2019-04-16 NOTE — Therapy (Signed)
[facility] [address] [place], [place], [address] Phone: [phone]   Fax:  [phone]  Physical Therapy Treatment  Patient Details  Name: Paula Sutton MRN: [id number] Date of Birth: 29-Sep-1955 Referring Provider (PT): Courtney Heys, MD   Encounter Date: 04/16/2019  PT End of Session - 04/16/19 2151    Visit Number  23    Number of Visits  28    Date for PT Re-Evaluation  04/26/19    Authorization Type  [employer], Medicare Secondary    PT Start Time  1231    PT Stop Time  1313    PT Time Calculation (min)  42 min    Equipment Utilized During Treatment  Gait belt    Activity Tolerance  Patient tolerated treatment well    Behavior During Therapy  WFL for tasks assessed/performed       Past Medical History:  Diagnosis Date  . Arthritis    knee  . Dyslipidemia   . GERD (gastroesophageal reflux disease)   . Left anterior fascicular block    PATIENT SAYS THIS SHOWED ON EKG DURING COLONOSCOPY , WAS REFERRED TO CARDIOLOGY DR. Minus Breeding  (SEE EPIC ENCOUNTER 09-2017)       Past Surgical History:  Procedure Laterality Date  . ABDOMINAL HYSTERECTOMY    . BIOPSY  08/09/2017   Procedure: BIOPSY;  Surgeon: Danie Binder, MD;  [facility];  Service: Endoscopy;;  gastric biopsy for h pylori  . CARPAL TUNNEL RELEASE Right 12/07/2018  . CATARACT EXTRACTION, BILATERAL     DID 1 YEAR APART , LAST ONE WAS 12-2017  . COLONOSCOPY WITH ESOPHAGOGASTRODUODENOSCOPY (EGD)  2014   [place] PROPOFOL N/A 08/09/2017   3 simple adenomas, diverticulosis in recto-sigmoid, sigmoid, and descending colin, moderate external and internal hemorrhoids  . ESOPHAGOGASTRODUODENOSCOPY (EGD) WITH PROPOFOL N/A 08/09/2017   Low-grade narrowing Schatzki ring due to GERD; small hiatal hernia; mild gastritis and duodenitis due to NSAIDs; biopsy with gastritis  . KNEE ARTHROSCOPY Right 11/2017   WITH DR Wynelle Link  AT [place] Right   . POLYPECTOMY  08/09/2017   Procedure: POLYPECTOMY;  Surgeon: Danie Binder, MD;  [facility];  Service: Endoscopy;;  cecal polyp hs, transverse colon polyp hs, rectal polyp cs  . TOTAL KNEE REVISION Right 03/01/2018   Procedure: RIGHT TOTAL KNEE REVISION;  Surgeon: Gaynelle Arabian, MD;  Location: [facility];  Service: Orthopedics;  Laterality: Right;  170mn    There were no vitals filed for this visit.  Subjective Assessment - 04/16/19 1236    Subjective  Loved aquatic therapy. Went to see her MD on Friday - knee replacement is stable, the "clicking" is from weakness in her RLE, most notably her quadriceps and to follow back up in 2 years. Stood in the shower for the first time today and felt very balanced.    Patient is accompained by:  Family member   husband   Pertinent History  GBS (diagnosed 9/20) history of OSA, GERD, recent CTR right hand    How long can you walk comfortably?  10-15 minutes or 260'    Diagnostic tests  MRI - had imaging done on brain last week, waiting for results -Had  a nerve conduction test last week: 50% on L, 25% on R    Patient Stated Goals  wants to walk completely without a RW    Currently in  Pain?  No/denies                       [facility] Adult PT Treatment/Exercise - 04/16/19 2210      Transfers   Comments   blue mat table raised to 24.5 inches, 2 x 7 reps sit <> stands with no UE support, cues for glute activation in standing and sitting back down to mat table with proper technique and eccentric control. Standing at edge of mat with RW: 3 x 5 reps mini squats, cues for proper technique, pt able to perform first 2 reps with no UE support, but then needing to use BUE on RW for balance.       Exercises   Other Exercises   Seated on green physioball next to mat table: min guard for balance, attempted anterior/posterior pelvic tilts, however pt unable to coordinate movement.  Performed alternating UE lifts with cues for posture and core activation 1 x 10 reps, progressing to alternating UE lifts with contralateral heel lift for balance 1 x 10 reps.  In // bars: 1 x 10 reps step ups with LLE onto 4" step, 2 x 8 reps step ups with RLE onto 4" step, pt able to tolerate more reps with less rest breaks - need for BUE support. With red theraband around ankles: side stepping down and back 2 reps                PT Short Term Goals - 03/28/19 1408      PT SHORT TERM GOAL #1   Title  Patient will be independent with ongoing HEP in order to build upon functional gains in therapy. ALL STGS DUE 03/27/19    Baseline  pt is independent with HEP    Time  4    Period  Weeks    Status  Achieved    Target Date  03/27/19      PT SHORT TERM GOAL #2   Title  Patient will increase BERG score to at least 40/56 in order to demo decr fall risk.    Baseline  42/56 on 03/28/19    Time  4    Period  Weeks    Status  Achieved      PT SHORT TERM GOAL #3   Title  Patient will decrease 5 times sit <> stand score to at least 22 seconds with single UE support in order to improve functional LE strength.    Baseline  22 seconds with BUE support from standard arm chair on 03/28/19    Time  4    Period  Weeks    Status  Not Met      PT SHORT TERM GOAL #4   Title  Patient will ambulate at least 230' over outdoor surfaces with RW and supervision in order to improve endurance for gait and community mobility.    Baseline  pt ambulated into clinic from car with RW with mod I and out back to car with RW with supervision.    Time  4    Period  Weeks    Status  Achieved      PT SHORT TERM GOAL #5   Title  Patient will increase gait speed to at least 2.3 ft/sec in order to decrease risk of falls.    Baseline  1.75 ft/sec with SPC, 1.90 with RW on 03/28/19    Time  4    Period  Weeks    Status  Not Met      PT SHORT TERM GOAL #6   Title  Patient will demonstrate proper technique to pick up  an object from the floor at least 5 reps with supervision and no LOB.    Baseline  3 reps - pt dependent on LLE for weight shift    Time  4    Period  Weeks    Status  New        PT Long Term Goals - 03/29/19 1454      PT LONG TERM GOAL #1   Title  Patient will be independent with final HEP in order to build upon functional gains in therapy. ALL LTGS DUE 04/26/19    Time  12    Period  Weeks    Status  New      PT LONG TERM GOAL #2   Title  Patient will increase baseline BERG score to at least 44/56 in order to demo decr fall risk.    Baseline  42/56    Time  12    Period  Weeks    Status  Revised      PT LONG TERM GOAL #3   Title  Patient will ambulate at least 300' outdoors over unlevel surfaces with LRAD and supervision in order to improve community mobility.    Time  12    Period  Weeks    Status  New      PT LONG TERM GOAL #4   Title  Patient will perform TUG in 18 seconds or less with LRAD in order to demo decreased fall risk.    Time  12    Period  Weeks    Status  New      PT LONG TERM GOAL #5   Title  Patient will perform 5x sit <> stand in 18 seconds or less without BUE support in order to demo improved LE strength.    Baseline  22 seconds with BUE support from arm chair    Time  12    Period  Weeks    Status  Revised      PT LONG TERM GOAL #6   Title  Patient will improve gait speed to at least 2.2 ft/sec using LRAD in order to demo decreased fall risk and improved community mobility.    Time  12    Period  Weeks    Status  New            Plan - 04/16/19 2152    Clinical Impression Statement  Focus of today's skilled session was functional LE and core strengthening. Pt tolerated session well - able to perform sit <> stands today from elevated mat table with no UE support and perform increased reps of step ups onto 4" step with RLE. Pt reporting feeling stronger in RLE. Will continue to progress towards LTGs.    Personal Factors and Comorbidities   Comorbidity 2;Past/Current Experience    Comorbidities  OSA, GERD    Examination-Activity Limitations  Locomotion Level;Transfers;Stairs;Stand    Examination-Participation Restrictions  Community Activity    Stability/Clinical Decision Making  Evolving/Moderate complexity    Rehab Potential  Good    PT Frequency  3x / week   followed by 2x week for an additional 8 weeks   PT Duration  4 weeks   followed by 2x week for an additional 8 weeks   PT Treatment/Interventions  ADLs/Self Care Home Management;Electrical Stimulation;Aquatic Therapy;Gait training;DME Instruction;Stair training;Functional mobility training;Therapeutic activities;Therapeutic  exercise;Patient/family education;Neuromuscular re-education;Balance training;Passive range of motion    PT Next Visit Plan  hip ABD strengthening. Step ups/step taps on 4" step. NuStep/SciFit for LE strengthening and endurance. supine strengthening. continue sit <> stand training, standing balance activites/dynamic gait in // bars.  monitor fatigue.    PT Home Exercise Plan  [id number] plus seated hip ADD pillow squeezes    Consulted and Agree with Plan of Care  Patient       Patient will benefit from skilled therapeutic intervention in order to improve the following deficits and impairments:  Abnormal gait, Decreased activity tolerance, Decreased coordination, Decreased balance, Decreased endurance, Decreased strength, Difficulty walking, Decreased mobility  Visit Diagnosis: Muscle weakness (generalized)  Other abnormalities of gait and mobility  Difficulty in walking, not elsewhere classified  Other symptoms and signs involving the nervous system     Problem List Patient Active Problem List   Diagnosis Date Noted  . Overactive bladder 03/28/2019  . Guillain Barr syndrome ([place]) 01/19/2019  . Cervical myofascial pain syndrome 12/16/2018  . Carpal tunnel syndrome 12/16/2018  . 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 Leg  weakness, bilateral 12/08/2018  . IBS (irritable bowel syndrome) 12/08/2018  . GERD (gastroesophageal reflux disease) 09/14/2018  . 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 Abdominal pain, epigastric 06/03/2017  . Constipation 01/27/2016  . History of adenomatous polyp of colon 01/27/2016    Arliss Journey, PT, DPT 04/16/2019, 10:11 PM  [facility] [address] [place], [place], [address] Phone: [phone]   Fax:  [phone]  Name: KYE SILVERSTEIN MRN: [id number] Date of Birth: Aug 30, 1955

## 2019-04-18 ENCOUNTER — Other Ambulatory Visit: Payer: Self-pay

## 2019-04-18 ENCOUNTER — Encounter: Payer: Self-pay | Admitting: Physical Therapy

## 2019-04-18 ENCOUNTER — Ambulatory Visit: Payer: Medicare Other | Admitting: Physical Therapy

## 2019-04-18 DIAGNOSIS — R2689 Other abnormalities of gait and mobility: Secondary | ICD-10-CM | POA: Diagnosis not present

## 2019-04-18 DIAGNOSIS — R29818 Other symptoms and signs involving the nervous system: Secondary | ICD-10-CM

## 2019-04-18 DIAGNOSIS — M6281 Muscle weakness (generalized): Secondary | ICD-10-CM | POA: Diagnosis not present

## 2019-04-18 DIAGNOSIS — M9902 Segmental and somatic dysfunction of thoracic region: Secondary | ICD-10-CM | POA: Diagnosis not present

## 2019-04-18 DIAGNOSIS — M9903 Segmental and somatic dysfunction of lumbar region: Secondary | ICD-10-CM | POA: Diagnosis not present

## 2019-04-18 DIAGNOSIS — M47812 Spondylosis without myelopathy or radiculopathy, cervical region: Secondary | ICD-10-CM | POA: Diagnosis not present

## 2019-04-18 DIAGNOSIS — R262 Difficulty in walking, not elsewhere classified: Secondary | ICD-10-CM | POA: Diagnosis not present

## 2019-04-18 DIAGNOSIS — M546 Pain in thoracic spine: Secondary | ICD-10-CM | POA: Diagnosis not present

## 2019-04-18 DIAGNOSIS — M9901 Segmental and somatic dysfunction of cervical region: Secondary | ICD-10-CM | POA: Diagnosis not present

## 2019-04-18 NOTE — Therapy (Signed)
[facility] [address] [place], [place], [address] Phone: [phone]   Fax:  [phone]  Physical Therapy Treatment  Patient Details  Name: Paula Sutton MRN: [id number] Date of Birth: 1955/05/30 Referring Provider (PT): Courtney Heys, MD   Encounter Date: 04/18/2019  PT End of Session - 04/18/19 1810    Visit Number  24    Number of Visits  28    Date for PT Re-Evaluation  04/26/19    Authorization Type  [employer], Medicare Secondary    PT Start Time  1330    PT Stop Time  1415    PT Time Calculation (min)  45 min    Equipment Utilized During Treatment  --   pool noodle, buoyancy cuffs, neck noodle   Activity Tolerance  Patient tolerated treatment well    Behavior During Therapy  [facility] for tasks assessed/performed       Past Medical History:  Diagnosis Date  . Arthritis    knee  . Dyslipidemia   . GERD (gastroesophageal reflux disease)   . Left anterior fascicular block    PATIENT SAYS THIS SHOWED ON EKG DURING COLONOSCOPY , WAS REFERRED TO CARDIOLOGY DR. Minus Breeding  (SEE EPIC ENCOUNTER 09-2017)       Past Surgical History:  Procedure Laterality Date  . ABDOMINAL HYSTERECTOMY    . BIOPSY  08/09/2017   Procedure: BIOPSY;  Surgeon: Danie Binder, MD;  [facility];  Service: Endoscopy;;  gastric biopsy for h pylori  . CARPAL TUNNEL RELEASE Right 12/07/2018  . CATARACT EXTRACTION, BILATERAL     DID 1 YEAR APART , LAST ONE WAS 12-2017  . COLONOSCOPY WITH ESOPHAGOGASTRODUODENOSCOPY (EGD)  2014   [place] PROPOFOL N/A 08/09/2017   3 simple adenomas, diverticulosis in recto-sigmoid, sigmoid, and descending colin, moderate external and internal hemorrhoids  . ESOPHAGOGASTRODUODENOSCOPY (EGD) WITH PROPOFOL N/A 08/09/2017   Low-grade narrowing Schatzki ring due to GERD; small hiatal hernia; mild gastritis and duodenitis due to NSAIDs; biopsy with gastritis   . KNEE ARTHROSCOPY Right 11/2017   WITH DR Wynelle Link  AT [place] Right   . POLYPECTOMY  08/09/2017   Procedure: POLYPECTOMY;  Surgeon: Danie Binder, MD;  [facility];  Service: Endoscopy;;  cecal polyp hs, transverse colon polyp hs, rectal polyp cs  . TOTAL KNEE REVISION Right 03/01/2018   Procedure: RIGHT TOTAL KNEE REVISION;  Surgeon: Gaynelle Arabian, MD;  [facility];  Service: Orthopedics;  Laterality: Right;  1103mn    There were no vitals filed for this visit.  Subjective Assessment - 04/18/19 1809    Subjective  States her R knee gave way yesterday when she put weight thru R heel.  Did not fall. Going to see chiropractor today.    Patient is accompained by:  Family member   husband   Pertinent History  GBS (diagnosed 9/20) history of OSA, GERD, recent CTR right hand    How long can you walk comfortably?  10-15 minutes or 260'    Diagnostic tests  MRI - had imaging done on brain last week, waiting for results -Had  a nerve conduction test last week: 50% on L, 25% on R    Patient Stated Goals  wants to walk completely without a RW    Currently in Pain?  No/denies       [facility]- pool temp 86.7degrees  Patient seen for aquatic therapy today. Treatment took place in water 3.5-4 feet deep depending upon activity. Pt entered and exited the pool via step negotiation step by step sequence with use of bil. hand rails with supervision:   Runner's stretch each leg 30 sec hold at beginning of session for hamstring &gastroc stretch; and feet up wall for 30 second for gastroc  Pt performed gait training in pool with bil. UE support on pool noodle forwards 44mx 2 reps then 238m 2 reps at increase in cadence.  Performed side stepping holding pool noodle x 2548m2, 32m40m reps backwards holding pool noodle.  Pt performed standing hipflexion, hamstring curl, hip extension, straight leg flexion and abduction 10 reps x 2 sets on  LLE with buoyancy cuff and UE support of pool edge. Placed buoyancy cuff on RLE and performed several reps of each of previous LE exercises but pt unable to perform with correct technique and some movements unable to achieve AROM with cuff (knee extension, hamstring curl) so cuff removed and performed 10 reps of standing hipflexion, hamstring curl, hip extension, straight leg flexion and abduction without cuff and with UE support.  Squats 10 reps with pt holding onto side of pool; unilateral squats 10 reps each leg with pt holding onto pool edge with bil. UE support Pt in supine position with pool noodle behind back and neck noodle with PTA assisting to support.  Pt performed hip abd/add x 44m 13m 472m a31m72m of72mycling LE's.  In same position placed pt's feet on pool edge with PTA assisting pt into knee flexion then pt pushing off against pool wall.  Seated on pool bench for 10 reps sit<>stand without UE support with cues for forward lean and to avoid pushing legs back against pool seat.  Pt requires buoyancy for support with balance and viscosity for resistance for strengthening exercises; buoyancy is also needed for off loading body to assist with exercises.   PT Short Term Goals - 03/28/19 1408      PT SHORT TERM GOAL #1   Title  Patient will be independent with ongoing HEP in order to build upon functional gains in therapy. ALL STGS DUE 03/27/19    Baseline  pt is independent with HEP    Time  4    Period  Weeks    Status  Achieved    Target Date  03/27/19      PT SHORT TERM GOAL #2   Title  Patient will increase BERG score to at least 40/56 in order to demo decr fall risk.    Baseline  42/56 on 03/28/19    Time  4    Period  Weeks    Status  Achieved      PT SHORT TERM GOAL #3   Title  Patient will decrease 5 times sit <> stand score to at least 22 seconds with single UE support in order to improve functional LE strength.    Baseline  22 seconds with BUE support from  standard arm chair on 03/28/19    Time  4    Period  Weeks    Status  Not Met      PT SHORT TERM GOAL #4   Title  Patient will ambulate at least 230' over outdoor surfaces with RW and supervision in order to improve endurance for gait and community mobility.    Baseline  pt ambulated into clinic from car with RW with mod I  and out back to car with RW with supervision.    Time  4    Period  Weeks    Status  Achieved      PT SHORT TERM GOAL #5   Title  Patient will increase gait speed to at least 2.3 ft/sec in order to decrease risk of falls.    Baseline  1.75 ft/sec with SPC, 1.90 with RW on 03/28/19    Time  4    Period  Weeks    Status  Not Met      PT SHORT TERM GOAL #6   Title  Patient will demonstrate proper technique to pick up an object from the floor at least 5 reps with supervision and no LOB.    Baseline  3 reps - pt dependent on LLE for weight shift    Time  4    Period  Weeks    Status  New        PT Long Term Goals - 03/29/19 1454      PT LONG TERM GOAL #1   Title  Patient will be independent with final HEP in order to build upon functional gains in therapy. ALL LTGS DUE 04/26/19    Time  12    Period  Weeks    Status  New      PT LONG TERM GOAL #2   Title  Patient will increase baseline BERG score to at least 44/56 in order to demo decr fall risk.    Baseline  42/56    Time  12    Period  Weeks    Status  Revised      PT LONG TERM GOAL #3   Title  Patient will ambulate at least 300' outdoors over unlevel surfaces with LRAD and supervision in order to improve community mobility.    Time  12    Period  Weeks    Status  New      PT LONG TERM GOAL #4   Title  Patient will perform TUG in 18 seconds or less with LRAD in order to demo decreased fall risk.    Time  12    Period  Weeks    Status  New      PT LONG TERM GOAL #5   Title  Patient will perform 5x sit <> stand in 18 seconds or less without BUE support in order to demo improved LE strength.     Baseline  22 seconds with BUE support from arm chair    Time  12    Period  Weeks    Status  Revised      PT LONG TERM GOAL #6   Title  Patient will improve gait speed to at least 2.2 ft/sec using LRAD in order to demo decreased fall risk and improved community mobility.    Time  12    Period  Weeks    Status  New            Plan - 04/18/19 1811    Clinical Impression Statement  Pt unable to perform RLE exercises with buoyancy cuff due to weakness.  Worked hard during session and did not require rest breaks.  Continue to focus on strength, flexibility and balance.  Continue PT per POC.    Personal Factors and Comorbidities  Comorbidity 2;Past/Current Experience    Comorbidities  OSA, GERD    Examination-Activity Limitations  Locomotion Level;Transfers;Stairs;Stand    Examination-Participation Restrictions  Community Activity  Stability/Clinical Decision Making  Evolving/Moderate complexity    Rehab Potential  Good    PT Frequency  3x / week   followed by 2x week for an additional 8 weeks   PT Duration  4 weeks   followed by 2x week for an additional 8 weeks   PT Treatment/Interventions  ADLs/Self Care Home Management;Electrical Stimulation;Aquatic Therapy;Gait training;DME Instruction;Stair training;Functional mobility training;Therapeutic activities;Therapeutic exercise;Patient/family education;Neuromuscular re-education;Balance training;Passive range of motion    PT Next Visit Plan  hip ABD strengthening. Step ups/step taps on 4" step. NuStep/SciFit for LE strengthening and endurance. supine strengthening. continue sit <> stand training, standing balance activites/dynamic gait in // bars.  monitor fatigue.    PT Home Exercise Plan  [id number] plus seated hip ADD pillow squeezes    Consulted and Agree with Plan of Care  Patient       Patient will benefit from skilled therapeutic intervention in order to improve the following deficits and impairments:  Abnormal gait, Decreased  activity tolerance, Decreased coordination, Decreased balance, Decreased endurance, Decreased strength, Difficulty walking, Decreased mobility  Visit Diagnosis: Muscle weakness (generalized)  Other abnormalities of gait and mobility  Difficulty in walking, not elsewhere classified  Other symptoms and signs involving the nervous system     Problem List Patient Active Problem List   Diagnosis Date Noted  . Overactive bladder 03/28/2019  . Guillain Barr syndrome ([place]) 01/19/2019  . Cervical myofascial pain syndrome 12/16/2018  . Carpal tunnel syndrome 12/16/2018  . Axonal GBS (Guillain-Barre syndrome) ([place]) 12/15/2018  . Leg weakness, bilateral 12/08/2018  . IBS (irritable bowel syndrome) 12/08/2018  . GERD (gastroesophageal reflux disease) 09/14/2018  . 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 Abdominal pain, epigastric 06/03/2017  . Constipation 01/27/2016  . History of adenomatous polyp of colon 01/27/2016    Narda Bonds, PTA [place] 04/18/19 6:24 PM Phone: [phone] Fax: [facility] [address] [place], [place], [address] Phone: [phone]   Fax:  [phone]  Name: Paula Sutton MRN: [id number] Date of Birth: [birth year]

## 2019-04-23 ENCOUNTER — Ambulatory Visit: Payer: Medicare Other | Admitting: Physical Therapy

## 2019-04-25 ENCOUNTER — Other Ambulatory Visit: Payer: Self-pay

## 2019-04-25 ENCOUNTER — Encounter: Payer: Self-pay | Admitting: Physical Therapy

## 2019-04-25 ENCOUNTER — Ambulatory Visit: Payer: Medicare Other | Admitting: Physical Therapy

## 2019-04-25 DIAGNOSIS — M6281 Muscle weakness (generalized): Secondary | ICD-10-CM

## 2019-04-25 DIAGNOSIS — R262 Difficulty in walking, not elsewhere classified: Secondary | ICD-10-CM

## 2019-04-25 DIAGNOSIS — R29818 Other symptoms and signs involving the nervous system: Secondary | ICD-10-CM | POA: Diagnosis not present

## 2019-04-25 DIAGNOSIS — R2689 Other abnormalities of gait and mobility: Secondary | ICD-10-CM

## 2019-04-25 DIAGNOSIS — G610 Guillain-Barre syndrome: Secondary | ICD-10-CM | POA: Diagnosis not present

## 2019-04-25 NOTE — Therapy (Signed)
[facility] [address] [place], [place], [address] Phone: [phone]   Fax:  [phone]  Physical Therapy Treatment  Patient Details  Name: Paula Sutton MRN: [id number] Date of Birth: August 16, 1955 Referring Provider (PT): Courtney Heys, MD   Encounter Date: 04/25/2019  PT End of Session - 04/25/19 1731    Visit Number  25    Number of Visits  28    Date for PT Re-Evaluation  04/26/19    Authorization Type  [employer], Medicare Secondary    PT Start Time  1330    PT Stop Time  1415    PT Time Calculation (min)  45 min    Equipment Utilized During Treatment  Other (comment)   pool noodle, buoyancy cuffs, neck noodle, pool barbells   Activity Tolerance  Patient tolerated treatment well    Behavior During Therapy  [facility] for tasks assessed/performed       Past Medical History:  Diagnosis Date  . Arthritis    knee  . Dyslipidemia   . GERD (gastroesophageal reflux disease)   . Left anterior fascicular block    PATIENT SAYS THIS SHOWED ON EKG DURING COLONOSCOPY , WAS REFERRED TO CARDIOLOGY DR. Minus Breeding  (SEE EPIC ENCOUNTER 09-2017)       Past Surgical History:  Procedure Laterality Date  . ABDOMINAL HYSTERECTOMY    . BIOPSY  08/09/2017   Procedure: BIOPSY;  Surgeon: Danie Binder, MD;  Location: [facility];  Service: Endoscopy;;  gastric biopsy for h pylori  . CARPAL TUNNEL RELEASE Right 12/07/2018  . CATARACT EXTRACTION, BILATERAL     DID 1 YEAR APART , LAST ONE WAS 12-2017  . COLONOSCOPY WITH ESOPHAGOGASTRODUODENOSCOPY (EGD)  2014   [place] PROPOFOL N/A 08/09/2017   3 simple adenomas, diverticulosis in recto-sigmoid, sigmoid, and descending colin, moderate external and internal hemorrhoids  . ESOPHAGOGASTRODUODENOSCOPY (EGD) WITH PROPOFOL N/A 08/09/2017   Low-grade narrowing Schatzki ring due to GERD; small hiatal hernia; mild gastritis and duodenitis due to  NSAIDs; biopsy with gastritis  . KNEE ARTHROSCOPY Right 11/2017   WITH DR Wynelle Link  AT [place] Right   . POLYPECTOMY  08/09/2017   Procedure: POLYPECTOMY;  Surgeon: Danie Binder, MD;  Location: [facility];  Service: Endoscopy;;  cecal polyp hs, transverse colon polyp hs, rectal polyp cs  . TOTAL KNEE REVISION Right 03/01/2018   Procedure: RIGHT TOTAL KNEE REVISION;  Surgeon: Gaynelle Arabian, MD;  Location: [facility];  Service: Orthopedics;  Laterality: Right;  18mn    There were no vitals filed for this visit.  Subjective Assessment - 04/25/19 1729    Subjective  Pt reports fall in the bathroom since last visit.  Slipped on rug in bathroom.  Has since removed rugs.    Patient is accompained by:  Family member   husband   Pertinent History  GBS (diagnosed 9/20) history of OSA, GERD, recent CTR right hand    How long can you walk comfortably?  10-15 minutes or 260'    Diagnostic tests  MRI - had imaging done on brain last week, waiting for results -Had  a nerve conduction test last week: 50% on L, 25% on R    Patient Stated Goals  wants to walk completely without a RW    Currently in Pain?  No/denies       [facility]- pool temp 86.7degrees  Patient seen for aquatic therapy today. Treatment took place in water 3.5-4 feet deep depending upon activity. Pt entered and exited the pool via step negotiation step by step sequence with use of bil. hand rails with supervision:   Pt performed gait training in pool with bil. UE support on white pool noodle forwards 61mx 2 reps then 257m 2 reps at increase in cadence. Performed side stepping holding pool noodle x 2533m2, 67m37m reps backwards holding pool noodle. Attempted side stepping squat with support of bil pool barbells but not enough support for side stepping.  Performed forward gait 67m 68m backward 67m w29mpool barbells.  Pt performed standing hipflexion,hamstring curl, hip extension,  straight leg flexion and abduction 10 reps x 2 sets on LLE with buoyancy cuff and RLE without cuff and UE support of pool edge through out. First set pt performed single leg then switched to other leg and second set had pt do alternating LE's.   Squats 10 reps with pt holding onto side of pool; unilateral squats10reps each leg with pt holding onto pool edge with bil. UE support. Holding onto railing for side stepping with squat x 63m ea57mirection. Pt with decreased reliance on rail as repetitions increased.  Through out session focused on equal weight bearing and weight shifting.  Tends to rely on LLE more.  Pt requires buoyancy for support with balance and viscosity for resistance for strengthening exercises; buoyancy is also needed for off loading body to assist with exercises.      PT Short Term Goals - 03/28/19 1408      PT SHORT TERM GOAL #1   Title  Patient will be independent with ongoing HEP in order to build upon functional gains in therapy. ALL STGS DUE 03/27/19    Baseline  pt is independent with HEP    Time  4    Period  Weeks    Status  Achieved    Target Date  03/27/19      PT SHORT TERM GOAL #2   Title  Patient will increase BERG score to at least 40/56 in order to demo decr fall risk.    Baseline  42/56 on 03/28/19    Time  4    Period  Weeks    Status  Achieved      PT SHORT TERM GOAL #3   Title  Patient will decrease 5 times sit <> stand score to at least 22 seconds with single UE support in order to improve functional LE strength.    Baseline  22 seconds with BUE support from standard arm chair on 03/28/19    Time  4    Period  Weeks    Status  Not Met      PT SHORT TERM GOAL #4   Title  Patient will ambulate at least 230' over outdoor surfaces with RW and supervision in order to improve endurance for gait and community mobility.    Baseline  pt ambulated into clinic from car with RW with mod I and out back to car with RW with supervision.    Time  4     Period  Weeks    Status  Achieved      PT SHORT TERM GOAL #5   Title  Patient will increase gait speed to at least 2.3 ft/sec in order to decrease risk of falls.    Baseline  1.75 ft/sec with SPC, 1.90 with RW on 03/28/19  Time  4    Period  Weeks    Status  Not Met      PT SHORT TERM GOAL #6   Title  Patient will demonstrate proper technique to pick up an object from the floor at least 5 reps with supervision and no LOB.    Baseline  3 reps - pt dependent on LLE for weight shift    Time  4    Period  Weeks    Status  New        PT Long Term Goals - 03/29/19 1454      PT LONG TERM GOAL #1   Title  Patient will be independent with final HEP in order to build upon functional gains in therapy. ALL LTGS DUE 04/26/19    Time  12    Period  Weeks    Status  New      PT LONG TERM GOAL #2   Title  Patient will increase baseline BERG score to at least 44/56 in order to demo decr fall risk.    Baseline  42/56    Time  12    Period  Weeks    Status  Revised      PT LONG TERM GOAL #3   Title  Patient will ambulate at least 300' outdoors over unlevel surfaces with LRAD and supervision in order to improve community mobility.    Time  12    Period  Weeks    Status  New      PT LONG TERM GOAL #4   Title  Patient will perform TUG in 18 seconds or less with LRAD in order to demo decreased fall risk.    Time  12    Period  Weeks    Status  New      PT LONG TERM GOAL #5   Title  Patient will perform 5x sit <> stand in 18 seconds or less without BUE support in order to demo improved LE strength.    Baseline  22 seconds with BUE support from arm chair    Time  12    Period  Weeks    Status  Revised      PT LONG TERM GOAL #6   Title  Patient will improve gait speed to at least 2.2 ft/sec using LRAD in order to demo decreased fall risk and improved community mobility.    Time  12    Period  Weeks    Status  New            Plan - 04/25/19 1732    Clinical Impression  Statement  Pt continues to have R knee weakness and decreased balance with gait and standing.  Pt tends to have decreased weight shift/weight bearing on RLE with activities.  Continue PT per POC.    Personal Factors and Comorbidities  Comorbidity 2;Past/Current Experience    Comorbidities  OSA, GERD    Examination-Activity Limitations  Locomotion Level;Transfers;Stairs;Stand    Examination-Participation Restrictions  Community Activity    Stability/Clinical Decision Making  Evolving/Moderate complexity    Rehab Potential  Good    PT Frequency  3x / week   followed by 2x week for an additional 8 weeks   PT Duration  4 weeks   followed by 2x week for an additional 8 weeks   PT Treatment/Interventions  ADLs/Self Care Home Management;Electrical Stimulation;Aquatic Therapy;Gait training;DME Instruction;Stair training;Functional mobility training;Therapeutic activities;Therapeutic exercise;Patient/family education;Neuromuscular re-education;Balance training;Passive range of motion  PT Next Visit Plan  hip ABD strengthening. Step ups/step taps on 4" step. NuStep/SciFit for LE strengthening and endurance. supine strengthening. continue sit <> stand training, standing balance activites/dynamic gait in // bars.  monitor fatigue.    PT Home Exercise Plan  [id number] plus seated hip ADD pillow squeezes    Consulted and Agree with Plan of Care  Patient       Patient will benefit from skilled therapeutic intervention in order to improve the following deficits and impairments:  Abnormal gait, Decreased activity tolerance, Decreased coordination, Decreased balance, Decreased endurance, Decreased strength, Difficulty walking, Decreased mobility  Visit Diagnosis: Muscle weakness (generalized)  Other abnormalities of gait and mobility  Difficulty in walking, not elsewhere classified     Problem List Patient Active Problem List   Diagnosis Date Noted  . Overactive bladder 03/28/2019  . Guillain Barr  syndrome ([place]) 01/19/2019  . Cervical myofascial pain syndrome 12/16/2018  . Carpal tunnel syndrome 12/16/2018  . Axonal GBS (Guillain-Barre syndrome) ([place]) 12/15/2018  . Leg weakness, bilateral 12/08/2018  . IBS (irritable bowel syndrome) 12/08/2018  . GERD (gastroesophageal reflux disease) 09/14/2018  . 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 Abdominal pain, epigastric 06/03/2017  . Constipation 01/27/2016  . History of adenomatous polyp of colon 01/27/2016    Narda Bonds, PTA [place] 04/25/19 5:39 PM Phone: [phone] Fax: [facility] [address] [place], [place], [address] Phone: [phone]   Fax:  [phone]  Name: Paula Sutton MRN: [id number] Date of Birth: May 27, 1955

## 2019-04-30 ENCOUNTER — Other Ambulatory Visit: Payer: Self-pay

## 2019-04-30 ENCOUNTER — Encounter: Payer: Self-pay | Admitting: Physical Therapy

## 2019-04-30 ENCOUNTER — Ambulatory Visit: Payer: Medicare Other | Admitting: Physical Therapy

## 2019-04-30 DIAGNOSIS — R2689 Other abnormalities of gait and mobility: Secondary | ICD-10-CM

## 2019-04-30 DIAGNOSIS — R262 Difficulty in walking, not elsewhere classified: Secondary | ICD-10-CM

## 2019-04-30 DIAGNOSIS — M6281 Muscle weakness (generalized): Secondary | ICD-10-CM | POA: Diagnosis not present

## 2019-04-30 DIAGNOSIS — R29818 Other symptoms and signs involving the nervous system: Secondary | ICD-10-CM

## 2019-04-30 NOTE — Therapy (Signed)
[facility] [address] [place], [place], [address] Phone: [phone]   Fax:  [phone]  Physical Therapy Treatment/Re-Cert  Patient Details  Name: Paula Sutton MRN: [id number] Date of Birth: 09/15/1955 Referring Provider (PT): Courtney Heys, MD   Encounter Date: 04/30/2019  PT End of Session - 04/30/19 1452    Visit Number  26    Number of Visits  42    Date for PT Re-Evaluation  07/29/19    Authorization Type  [employer], Medicare Secondary    PT Start Time  1318    PT Stop Time  1400    PT Time Calculation (min)  42 min    Equipment Utilized During Treatment  Gait belt    Activity Tolerance  Patient tolerated treatment well    Behavior During Therapy  WFL for tasks assessed/performed       Past Medical History:  Diagnosis Date  . Arthritis    knee  . Dyslipidemia   . GERD (gastroesophageal reflux disease)   . Left anterior fascicular block    PATIENT SAYS THIS SHOWED ON EKG DURING COLONOSCOPY , WAS REFERRED TO CARDIOLOGY DR. Minus Breeding  (SEE EPIC ENCOUNTER 09-2017)       Past Surgical History:  Procedure Laterality Date  . ABDOMINAL HYSTERECTOMY    . BIOPSY  08/09/2017   Procedure: BIOPSY;  Surgeon: Danie Binder, MD;  Location: [facility];  Service: Endoscopy;;  gastric biopsy for h pylori  . CARPAL TUNNEL RELEASE Right 12/07/2018  . CATARACT EXTRACTION, BILATERAL     DID 1 YEAR APART , LAST ONE WAS 12-2017  . COLONOSCOPY WITH ESOPHAGOGASTRODUODENOSCOPY (EGD)  2014   [place] PROPOFOL N/A 08/09/2017   3 simple adenomas, diverticulosis in recto-sigmoid, sigmoid, and descending colin, moderate external and internal hemorrhoids  . ESOPHAGOGASTRODUODENOSCOPY (EGD) WITH PROPOFOL N/A 08/09/2017   Low-grade narrowing Schatzki ring due to GERD; small hiatal hernia; mild gastritis and duodenitis due to NSAIDs; biopsy with gastritis  . KNEE ARTHROSCOPY Right  11/2017   WITH DR Wynelle Link  AT [place] Right   . POLYPECTOMY  08/09/2017   Procedure: POLYPECTOMY;  Surgeon: Danie Binder, MD;  Location: [facility];  Service: Endoscopy;;  cecal polyp hs, transverse colon polyp hs, rectal polyp cs  . TOTAL KNEE REVISION Right 03/01/2018   Procedure: RIGHT TOTAL KNEE REVISION;  Surgeon: Gaynelle Arabian, MD;  Location: [facility];  Service: Orthopedics;  Laterality: Right;  185mn    There were no vitals filed for this visit.  Subjective Assessment - 04/30/19 1322    Subjective  Has increased pain in the L lateral part of her leg, thinks it is a GBS flare up. Is enjoying aquatic therapy.    Patient is accompained by:  Family member   husband   Pertinent History  GBS (diagnosed 9/20) history of OSA, GERD, recent CTR right hand    How long can you walk comfortably?  10-15 minutes or 260'    Diagnostic tests  MRI - had imaging done on brain last week, waiting for results -Had  a nerve conduction test last week: 50% on L, 25% on R    Patient Stated Goals  wants to strengthen R leg, wants to get well enough to go dinner and dancing.    Currently in Pain?  Yes    Pain Score  5     Pain Location  Leg    Pain Orientation  Left    Pain Descriptors / Indicators  Aching    Pain Type  Neuropathic pain    Aggravating Factors   stretching makes it worse,but also makes it better.         [facility] PT Assessment - 04/30/19 1335      Berg Balance Test   Sit to Stand  Able to stand  independently using hands    Standing Unsupported  Able to stand safely 2 minutes    Sitting with Back Unsupported but Feet Supported on Floor or Stool  Able to sit safely and securely 2 minutes    Stand to Sit  Sits safely with minimal use of hands    Transfers  Able to transfer safely, minor use of hands    Standing Unsupported with Eyes Closed  Able to stand 10 seconds safely    Standing Unsupported with Feet Together  Able to place feet together independently  and stand 1 minute safely    From Standing, Reach Forward with Outstretched Arm  Can reach confidently >25 cm (10")    From Standing Position, Pick up Object from Floor  Able to pick up shoe safely and easily    From Standing Position, Turn to Look Behind Over each Shoulder  Looks behind from both sides and weight shifts well    Turn 360 Degrees  Able to turn 360 degrees safely but slowly   6.81 to R, 7.53 to L   Standing Unsupported, Alternately Place Feet on Step/Stool  Needs assistance to keep from falling or unable to try    Standing Unsupported, One Foot in Front  Able to plae foot ahead of the other independently and hold 30 seconds    Standing on One Leg  Able to lift leg independently and hold equal to or more than 3 seconds    Total Score  46    Berg comment:  46/56      Timed Up and Go Test   Normal TUG (seconds)  18.4   with RW                  [facility] Adult PT Treatment/Exercise - 04/30/19 1335      Transfers   Transfers  Sit to Stand;Stand to Sit    Sit to Stand  5: Supervision;Without upper extremity assist    Sit to Stand Details  Verbal cues for sequencing;Verbal cues for technique;Visual cues/gestures for precautions/safety;Visual cues/gestures for sequencing    Stand to Sit  5: Supervision;Without upper extremity assist    Comments  with no UE support: from elevated mat table with pt sitting in high perch on wedge for increased quad activation 1 x 10 reps, additional 1 x 10 reps performed with 2" block under LLE for increased weight bearing and strengthening through RLE, cues for wider BOS as pt demonstrates incr B genu valgum       Ambulation/Gait   Ambulation/Gait  Yes    Ambulation/Gait Assistance  5: Supervision    Ambulation/Gait Assistance Details  around clinic with RW    Ambulation Distance (Feet)  115 Feet    Assistive device  Rolling walker    Gait Pattern  Step-through pattern;Decreased hip/knee flexion - right;Decreased hip/knee flexion - left     Ambulation Surface  Level;Indoor    Gait velocity  17.47 seconds = 1.87 ft/sec with RW             PT Education - 04/30/19  58    Education Details  POC going forwards, progress towards goals    Person(s) Educated  Patient    Methods  Explanation    Comprehension  Verbalized understanding       PT Short Term Goals - 03/28/19 1408      PT SHORT TERM GOAL #1   Title  Patient will be independent with ongoing HEP in order to build upon functional gains in therapy. ALL STGS DUE 03/27/19    Baseline  pt is independent with HEP    Time  4    Period  Weeks    Status  Achieved    Target Date  03/27/19      PT SHORT TERM GOAL #2   Title  Patient will increase BERG score to at least 40/56 in order to demo decr fall risk.    Baseline  42/56 on 03/28/19    Time  4    Period  Weeks    Status  Achieved      PT SHORT TERM GOAL #3   Title  Patient will decrease 5 times sit <> stand score to at least 22 seconds with single UE support in order to improve functional LE strength.    Baseline  22 seconds with BUE support from standard arm chair on 03/28/19    Time  4    Period  Weeks    Status  Not Met      PT SHORT TERM GOAL #4   Title  Patient will ambulate at least 230' over outdoor surfaces with RW and supervision in order to improve endurance for gait and community mobility.    Baseline  pt ambulated into clinic from car with RW with mod I and out back to car with RW with supervision.    Time  4    Period  Weeks    Status  Achieved      PT SHORT TERM GOAL #5   Title  Patient will increase gait speed to at least 2.3 ft/sec in order to decrease risk of falls.    Baseline  1.75 ft/sec with SPC, 1.90 with RW on 03/28/19    Time  4    Period  Weeks    Status  Not Met      PT SHORT TERM GOAL #6   Title  Patient will demonstrate proper technique to pick up an object from the floor at least 5 reps with supervision and no LOB.    Baseline  3 reps - pt dependent on LLE for weight  shift    Time  4    Period  Weeks    Status  New      Revised STGs:   PT Short Term Goals - 04/30/19 1506      PT SHORT TERM GOAL #1   Title  Patient will ambulate at least 300' outdoors over unlevel surfaces with LRAD as well as perform a curb with supervision in order to improve community mobility. ALL STGS DUE 05/28/19.    Time  4    Period  Weeks    Status  New    Target Date  05/28/19      PT SHORT TERM GOAL #2   Title  Patient will increase gait speed to at least 2.2 ft/sec with RW in order to decrease risk of falls.    Baseline  1.87 ft/sec with RW    Time  4    Period  Weeks  Status  New      PT SHORT TERM GOAL #3   Title  Patient will decrease 5 times sit <> stand score to 17 seconds or less with BUE support in order to improve functional LE strength.    Baseline  18.84 seconds on 04/30/19    Time  4    Period  Weeks    Status  New        PT Long Term Goals - 04/30/19 1327      PT LONG TERM GOAL #1   Title  Patient will be independent with final HEP in order to build upon functional gains in therapy. ALL LTGS DUE 04/26/19    Baseline  pt reports performing her HEP independently.    Time  12    Period  Weeks    Status  Achieved      PT LONG TERM GOAL #2   Title  Patient will increase baseline BERG score to at least 44/56 in order to demo decr fall risk.    Baseline  46/56 on 04/30/19    Time  12    Period  Weeks    Status  Achieved      PT LONG TERM GOAL #3   Title  Patient will ambulate at least 300' outdoors over unlevel surfaces with LRAD and supervision in order to improve community mobility.    Baseline  deferred due to cold and rainy weather    Time  12    Period  Weeks    Status  Deferred      PT LONG TERM GOAL #4   Title  Patient will perform TUG in 18 seconds or less with LRAD in order to demo decreased fall risk.    Baseline  18.4 seconds with RW    Time  12    Period  Weeks    Status  Not Met      PT LONG TERM GOAL #5   Title  Patient  will perform 5x sit <> stand in 18 seconds or less without BUE support in order to demo improved LE strength.    Baseline  18.94 seconds with BUE support from standard arm chair.    Time  12    Period  Weeks    Status  Not Met      PT LONG TERM GOAL #6   Title  Patient will improve gait speed to at least 2.2 ft/sec using LRAD in order to demo decreased fall risk and improved community mobility.    Baseline  17.47 seconds = 1.87 ft/sec with RW    Time  12    Period  Weeks    Status  Not Met       Revised LTGs: PT Long Term Goals - 04/30/19 1509      PT LONG TERM GOAL #1   Title  Patient will be independent with final HEP in order to build upon functional gains in therapy. ALL LTGS DUE 06/25/19    Baseline  pt will benefit from on-going additions to HEP    Time  8    Period  Weeks    Status  On-going    Target Date  06/25/19      PT LONG TERM GOAL #2   Title  Patient will increase baseline BERG score to at least 48/56 in order to demo decr fall risk.    Baseline  46/56 on 04/30/19    Time  8    Period  Weeks    Status  New      PT LONG TERM GOAL #3   Title  Patient will ambulate at least 115' indoors with LRAD with supervision in order to improve household mobility.    Time  8    Period  Weeks    Status  New      PT LONG TERM GOAL #4   Title  Patient will perform TUG in 17 seconds or less with LRAD in order to demo decreased fall risk.    Baseline  18.4 seconds with RW    Time  8    Period  Weeks    Status  Revised      PT LONG TERM GOAL #5   Title  Pt will perform at least 2 out of 5 sit <> stands with no UE support from standard chair to demo improved functional LE strength.    Time  8    Period  Weeks    Status  New      PT LONG TERM GOAL #6   Title  Patient will improve gait speed to at least 2.5 ft/sec using LRAD in order to demo decreased fall risk and improved community mobility.    Baseline  17.47 seconds = 1.87 ft/sec with RW    Time  8    Period  Weeks     Status  On-going           Plan - 04/30/19 1504    Clinical Impression Statement  Focus of today's skilled session was assessing pt's LTGs for re-cert. Pt has met LTG #1 and #2 in regards to performing HEP and BERG score. Pt has increased her BERG score to a 46/56 (previously 42/56) and is now in the moderate fall risk category. Deferred LTG #3 in regards to gait outdoors due to bad weather conditions. Pt did not meet LTGs #4 - #6 in regards to TUG with RW, 5x sit <> stand, or gait speed. However, pt did show improvements in TUG and 5x sit <> stand, but not to goal. Pt's gait speed of 1.87 ft/sec with RW indicates  that pt is a limited community ambulator. Will re-cert for 2x week for an additional 8 weeks to focus on LE and core strengthening, balance, gait, ROM, posture, in order to improve functional mobility independence and decr pt's fall risk. STGs/ LTGs revised/ongoing as appropriate.    Personal Factors and Comorbidities  Comorbidity 2;Past/Current Experience    Comorbidities  OSA, GERD    Examination-Activity Limitations  Locomotion Level;Transfers;Stairs;Stand    Examination-Participation Restrictions  Community Activity    Stability/Clinical Decision Making  Evolving/Moderate complexity    Rehab Potential  Good    PT Frequency  2x / week    PT Duration  8 weeks    PT Treatment/Interventions  ADLs/Self Care Home Management;Electrical Stimulation;Aquatic Therapy;Gait training;DME Instruction;Stair training;Functional mobility training;Therapeutic activities;Therapeutic exercise;Patient/family education;Neuromuscular re-education;Balance training;Passive range of motion    PT Next Visit Plan  go over pt's HEP - add/remove as necessary. hip ABD strengthening. Step ups/step taps on 4" step. NuStep/SciFit for LE strengthening and endurance. supine strengthening. continue sit <> stand training, standing balance activites/dynamic gait in // bars.  monitor fatigue.    PT Home Exercise Plan   [id number] plus seated hip ADD pillow squeezes    Consulted and Agree with Plan of Care  Patient       Patient will benefit from skilled therapeutic intervention in order to improve the following deficits  and impairments:  Abnormal gait, Decreased activity tolerance, Decreased coordination, Decreased balance, Decreased endurance, Decreased strength, Difficulty walking, Decreased mobility  Visit Diagnosis: Muscle weakness (generalized)  Other abnormalities of gait and mobility  Difficulty in walking, not elsewhere classified  Other symptoms and signs involving the nervous system     Problem List Patient Active Problem List   Diagnosis Date Noted  . Overactive bladder 03/28/2019  . Guillain Barr syndrome ([place]) 01/19/2019  . Cervical myofascial pain syndrome 12/16/2018  . Carpal tunnel syndrome 12/16/2018  . 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 Leg weakness, bilateral 12/08/2018  . IBS (irritable bowel syndrome) 12/08/2018  . GERD (gastroesophageal reflux disease) 09/14/2018  . 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 Abdominal pain, epigastric 06/03/2017  . Constipation 01/27/2016  . History of adenomatous polyp of colon 01/27/2016    Arliss Journey, PT, DPT  04/30/2019, 3:05 PM  [facility] [address] [place], [place], [address] Phone: [phone]   Fax:  [phone]  Name: Paula Sutton MRN: [id number] Date of Birth: 1955/08/17

## 2019-05-01 ENCOUNTER — Other Ambulatory Visit: Payer: Self-pay | Admitting: Physical Medicine and Rehabilitation

## 2019-05-02 ENCOUNTER — Other Ambulatory Visit: Payer: Self-pay

## 2019-05-02 ENCOUNTER — Encounter: Payer: Self-pay | Admitting: Physical Therapy

## 2019-05-02 ENCOUNTER — Ambulatory Visit: Payer: Medicare Other | Admitting: Physical Therapy

## 2019-05-02 DIAGNOSIS — R262 Difficulty in walking, not elsewhere classified: Secondary | ICD-10-CM

## 2019-05-02 DIAGNOSIS — M6281 Muscle weakness (generalized): Secondary | ICD-10-CM | POA: Diagnosis not present

## 2019-05-02 DIAGNOSIS — R29818 Other symptoms and signs involving the nervous system: Secondary | ICD-10-CM | POA: Diagnosis not present

## 2019-05-02 DIAGNOSIS — R2689 Other abnormalities of gait and mobility: Secondary | ICD-10-CM | POA: Diagnosis not present

## 2019-05-02 NOTE — Telephone Encounter (Signed)
Recieved electronic medication refill request for linzess medication.  No mention of this medication in previous note.  Unsure if ok to refill. Please advise.

## 2019-05-02 NOTE — Therapy (Signed)
[facility] [address] [place], [place], [address] Phone: [phone]   Fax:  [phone]  Physical Therapy Treatment  Patient Details  Name: Paula Sutton MRN: [id number] Date of Birth: [birth year] Referring Provider (PT): Courtney Heys, MD   Encounter Date: 05/02/2019  PT End of Session - 05/02/19 1431    Visit Number  27    Number of Visits  42    Date for PT Re-Evaluation  07/29/19    Authorization Type  [employer], Medicare Secondary    PT Start Time  1340    PT Stop Time  1420    PT Time Calculation (min)  40 min    Equipment Utilized During Treatment  Other (comment)   pool bar bells, pool noodle, neck noodle, buoyancy cuffs   Activity Tolerance  Patient tolerated treatment well    Behavior During Therapy  [facility] for tasks assessed/performed       Past Medical History:  Diagnosis Date  . Arthritis    knee  . Dyslipidemia   . GERD (gastroesophageal reflux disease)   . Left anterior fascicular block    PATIENT SAYS THIS SHOWED ON EKG DURING COLONOSCOPY , WAS REFERRED TO CARDIOLOGY DR. Minus Breeding  (SEE EPIC ENCOUNTER 09-2017)       Past Surgical History:  Procedure Laterality Date  . ABDOMINAL HYSTERECTOMY    . BIOPSY  08/09/2017   Procedure: BIOPSY;  Surgeon: Danie Binder, MD;  [facility];  Service: Endoscopy;;  gastric biopsy for h pylori  . CARPAL TUNNEL RELEASE Right 12/07/2018  . CATARACT EXTRACTION, BILATERAL     DID 1 YEAR APART , LAST ONE WAS 12-2017  . COLONOSCOPY WITH ESOPHAGOGASTRODUODENOSCOPY (EGD)  2014   [place] PROPOFOL N/A 08/09/2017   3 simple adenomas, diverticulosis in recto-sigmoid, sigmoid, and descending colin, moderate external and internal hemorrhoids  . ESOPHAGOGASTRODUODENOSCOPY (EGD) WITH PROPOFOL N/A 08/09/2017   Low-grade narrowing Schatzki ring due to GERD; small hiatal hernia; mild gastritis and duodenitis due to  NSAIDs; biopsy with gastritis  . KNEE ARTHROSCOPY Right 11/2017   WITH DR Wynelle Link  AT [place] Right   . POLYPECTOMY  08/09/2017   Procedure: POLYPECTOMY;  Surgeon: Danie Binder, MD;  [facility];  Service: Endoscopy;;  cecal polyp hs, transverse colon polyp hs, rectal polyp cs  . TOTAL KNEE REVISION Right 03/01/2018   Procedure: RIGHT TOTAL KNEE REVISION;  Surgeon: Gaynelle Arabian, MD;  [facility]: [facility];  Service: Orthopedics;  Laterality: Right;  179min    There were no vitals filed for this visit.  Subjective Assessment - 05/02/19 1430    Subjective  Pt continues with R hip pain.  Denies falls or changes.    Patient is accompained by:  Family member   husband   Pertinent History  GBS (diagnosed 9/20) history of OSA, GERD, recent CTR right hand    How long can you walk comfortably?  10-15 minutes or 260'    Diagnostic tests  MRI - had imaging done on brain last week, waiting for results -Had  a nerve conduction test last week: 50% on L, 25% on R    Patient Stated Goals  wants to strengthen R leg, wants to get well enough to go dinner and dancing.    Currently in Pain?  Yes    Pain Score  4     Pain Location  Hip  hip/groin area   Pain Orientation  Right    Pain Descriptors / Indicators  Aching    Pain Type  Chronic pain    Pain Onset  More than a month ago    Pain Frequency  Constant       Aquatic therapy at [facility] - pool temp 86.7degrees  Patient seen for aquatic therapy today. Treatment took place in water 3.5-4 feet deep depending upon activity. Pt entered and exited the pool via step negotiation step by step sequence with use of bil. hand rails with supervision:   Pt performed gait training in pool with bil. UE support on pool bar bells forwards 31m x 2 repsthen 73m x 2 reps backwards. Performed side stepping holding pool bar bells x 81m x 2, Holding onto metal railing in pool for side stepping with exaggerated hip abduction x 80m x  2 reps working on hip flexibility and strength.   Seated on pool bench and performed sit<>stand x 15 reps bil LE's then single leg 10 reps each side.  Pt able to perform bil LE's with intermittent UE support but requires 1 UE support for single leg.  Has difficulty time keeping opposite leg off pool floor to stand on only other leg.  Seated for bil LE LAQ and hip flexion x 15 reps each with buoyancy cuffs.   Standing for hip circle bil directions x 10 and 1 UE support of pool edge. Supine with pool noodle behind back and neck noodle and PTA supporting pt for bil LE bicycling x 66m, hip abd/add x 74m then bicycling again x 40m.  Pt requires buoyancy for support with balance and viscosity for resistance for strengthening exercises; buoyancy is also needed for off loading body to assist with exercises.    PT Short Term Goals - 04/30/19 1506      PT SHORT TERM GOAL #1   Title  Patient will ambulate at least 300' outdoors over unlevel surfaces with LRAD as well as perform a curb with supervision in order to improve community mobility. ALL STGS DUE 05/28/19.    Time  4    Period  Weeks    Status  New    Target Date  05/28/19      PT SHORT TERM GOAL #2   Title  Patient will increase gait speed to at least 2.2 ft/sec with RW in order to decrease risk of falls.    Baseline  1.87 ft/sec with RW    Time  4    Period  Weeks    Status  New      PT SHORT TERM GOAL #3   Title  Patient will decrease 5 times sit <> stand score to 17 seconds or less with BUE support in order to improve functional LE strength.    Baseline  18.84 seconds on 04/30/19    Time  4    Period  Weeks    Status  New        PT Long Term Goals - 04/30/19 1509      PT LONG TERM GOAL #1   Title  Patient will be independent with final HEP in order to build upon functional gains in therapy. ALL LTGS DUE 06/25/19    Baseline  pt will benefit from on-going additions to HEP    Time  8    Period  Weeks    Status  On-going     Target Date  06/25/19      PT LONG  TERM GOAL #2   Title  Patient will increase baseline BERG score to at least 48/56 in order to demo decr fall risk.    Baseline  46/56 on 04/30/19    Time  8    Period  Weeks    Status  New      PT LONG TERM GOAL #3   Title  Patient will ambulate at least 115' indoors with LRAD with supervision in order to improve household mobility.    Time  8    Period  Weeks    Status  New      PT LONG TERM GOAL #4   Title  Patient will perform TUG in 17 seconds or less with LRAD in order to demo decreased fall risk.    Baseline  18.4 seconds with RW    Time  8    Period  Weeks    Status  Revised      PT LONG TERM GOAL #5   Title  Pt will perform at least 2 out of 5 sit <> stands with no UE support from standard chair to demo improved functional LE strength.    Time  8    Period  Weeks    Status  New      PT LONG TERM GOAL #6   Title  Patient will improve gait speed to at least 2.5 ft/sec using LRAD in order to demo decreased fall risk and improved community mobility.    Baseline  17.47 seconds = 1.87 ft/sec with RW    Time  8    Period  Weeks    Status  On-going            Plan - 05/02/19 1432    Clinical Impression Statement  Aquatic session focused on strengthening and flexibility along with balance.  Pt continues to have bil LE weakness and needs UE support with gait in pool.  Continue PT per POC.    Personal Factors and Comorbidities  Comorbidity 2;Past/Current Experience    Comorbidities  OSA, GERD    Examination-Activity Limitations  Locomotion Level;Transfers;Stairs;Stand    Examination-Participation Restrictions  Community Activity    Stability/Clinical Decision Making  Evolving/Moderate complexity    Rehab Potential  Good    PT Frequency  2x / week    PT Duration  8 weeks    PT Treatment/Interventions  ADLs/Self Care Home Management;Electrical Stimulation;Aquatic Therapy;Gait training;DME Instruction;Stair training;Functional  mobility training;Therapeutic activities;Therapeutic exercise;Patient/family education;Neuromuscular re-education;Balance training;Passive range of motion    PT Next Visit Plan  go over pt's HEP - add/remove as necessary. hip ABD strengthening. Step ups/step taps on 4" step. NuStep/SciFit for LE strengthening and endurance. supine strengthening. continue sit <> stand training, standing balance activites/dynamic gait in // bars.  monitor fatigue.    PT Home Exercise Plan  [id number] plus seated hip ADD pillow squeezes    Consulted and Agree with Plan of Care  Patient       Patient will benefit from skilled therapeutic intervention in order to improve the following deficits and impairments:  Abnormal gait, Decreased activity tolerance, Decreased coordination, Decreased balance, Decreased endurance, Decreased strength, Difficulty walking, Decreased mobility  Visit Diagnosis: Muscle weakness (generalized)  Other abnormalities of gait and mobility  Difficulty in walking, not elsewhere classified     Problem List Patient Active Problem List   Diagnosis Date Noted  . Overactive bladder 03/28/2019  . Guillain Barr syndrome ([place]) 01/19/2019  . Cervical myofascial pain syndrome 12/16/2018  . Carpal  tunnel syndrome 12/16/2018  . 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 Leg weakness, bilateral 12/08/2018  . IBS (irritable bowel syndrome) 12/08/2018  . GERD (gastroesophageal reflux disease) 09/14/2018  . 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 Abdominal pain, epigastric 06/03/2017  . Constipation 01/27/2016  . History of adenomatous polyp of colon 01/27/2016    Narda Bonds, PTA [place] 05/02/19 2:41 PM Phone: [phone] Fax: [facility] [address] [place], [place], [address] Phone: [phone]   Fax:  [phone]  Name: Paula Sutton MRN: [id number] Date of Birth: Sep 20, 1955

## 2019-05-04 ENCOUNTER — Telehealth: Payer: Self-pay

## 2019-05-04 MED ORDER — TRAMADOL HCL 50 MG PO TABS: 50.0000 mg | ORAL_TABLET | Freq: Four times a day (QID) | ORAL | 1 refills | Status: DC | PRN

## 2019-05-04 NOTE — Telephone Encounter (Signed)
Pharmacy sent a refill request for Tramadol, last filled 01/15/2019

## 2019-05-04 NOTE — Telephone Encounter (Signed)
Refilled tramadol 120 tabs with 1 RF

## 2019-05-07 ENCOUNTER — Ambulatory Visit: Payer: Medicare Other | Admitting: Physical Therapy

## 2019-05-07 ENCOUNTER — Other Ambulatory Visit: Payer: Self-pay

## 2019-05-07 DIAGNOSIS — M6281 Muscle weakness (generalized): Secondary | ICD-10-CM | POA: Diagnosis not present

## 2019-05-07 DIAGNOSIS — R29818 Other symptoms and signs involving the nervous system: Secondary | ICD-10-CM | POA: Diagnosis not present

## 2019-05-07 DIAGNOSIS — R262 Difficulty in walking, not elsewhere classified: Secondary | ICD-10-CM | POA: Diagnosis not present

## 2019-05-07 DIAGNOSIS — R2689 Other abnormalities of gait and mobility: Secondary | ICD-10-CM

## 2019-05-08 DIAGNOSIS — R5383 Other fatigue: Secondary | ICD-10-CM | POA: Diagnosis not present

## 2019-05-08 DIAGNOSIS — G4459 Other complicated headache syndrome: Secondary | ICD-10-CM | POA: Diagnosis not present

## 2019-05-08 DIAGNOSIS — G8222 Paraplegia, incomplete: Secondary | ICD-10-CM | POA: Diagnosis not present

## 2019-05-08 DIAGNOSIS — G610 Guillain-Barre syndrome: Secondary | ICD-10-CM | POA: Diagnosis not present

## 2019-05-08 NOTE — Therapy (Signed)
[facility] [address] [place], [place], [address] Phone: [phone]   Fax:  [phone]  Physical Therapy Treatment  Patient Details  Name: Paula Sutton MRN: [id number] Date of Birth: 07/25/1955 Referring Provider (PT): Courtney Heys, MD   Encounter Date: 05/07/2019  PT End of Session - 05/08/19 1604    Visit Number  28    Number of Visits  42    Date for PT Re-Evaluation  07/29/19    Authorization Type  [employer], Medicare Secondary    PT Start Time  1318    PT Stop Time  1403   5 minutes no charge due to pt using restroom   PT Time Calculation (min)  45 min    Equipment Utilized During Treatment  Gait belt    Activity Tolerance  Patient tolerated treatment well    Behavior During Therapy  [facility] for tasks assessed/performed       Past Medical History:  Diagnosis Date  . Arthritis    knee  . Dyslipidemia   . GERD (gastroesophageal reflux disease)   . Left anterior fascicular block    PATIENT SAYS THIS SHOWED ON EKG DURING COLONOSCOPY , WAS REFERRED TO CARDIOLOGY DR. Minus Breeding  (SEE EPIC ENCOUNTER 09-2017)       Past Surgical History:  Procedure Laterality Date  . ABDOMINAL HYSTERECTOMY    . BIOPSY  08/09/2017   Procedure: BIOPSY;  Surgeon: Danie Binder, MD;  [facility];  Service: Endoscopy;;  gastric biopsy for h pylori  . CARPAL TUNNEL RELEASE Right 12/07/2018  . CATARACT EXTRACTION, BILATERAL     DID 1 YEAR APART , LAST ONE WAS 12-2017  . COLONOSCOPY WITH ESOPHAGOGASTRODUODENOSCOPY (EGD)  2014   [place] PROPOFOL N/A 08/09/2017   3 simple adenomas, diverticulosis in recto-sigmoid, sigmoid, and descending colin, moderate external and internal hemorrhoids  . ESOPHAGOGASTRODUODENOSCOPY (EGD) WITH PROPOFOL N/A 08/09/2017   Low-grade narrowing Schatzki ring due to GERD; small hiatal hernia; mild gastritis and duodenitis due to NSAIDs; biopsy with  gastritis  . KNEE ARTHROSCOPY Right 11/2017   WITH DR Wynelle Link  AT [place] Right   . POLYPECTOMY  08/09/2017   Procedure: POLYPECTOMY;  Surgeon: Danie Binder, MD;  [facility];  Service: Endoscopy;;  cecal polyp hs, transverse colon polyp hs, rectal polyp cs  . TOTAL KNEE REVISION Right 03/01/2018   Procedure: RIGHT TOTAL KNEE REVISION;  Surgeon: Gaynelle Arabian, MD;  [facility];  Service: Orthopedics;  Laterality: Right;  123min    There were no vitals filed for this visit.                    [facility] Adult PT Treatment/Exercise - 05/08/19 0001      Transfers   Transfers  Sit to Stand;Stand to Sit    Sit to Stand  5: Supervision;With upper extremity assist    Sit to Stand Details  Verbal cues for sequencing;Verbal cues for technique;Visual cues/gestures for precautions/safety;Visual cues/gestures for sequencing    Sit to Stand Details (indicate cue type and reason)  from mat table, cues for hip ABD when performing       Ambulation/Gait   Ambulation/Gait  Yes    Ambulation/Gait Assistance  5: Supervision    Ambulation/Gait Assistance Details  around clinic with RW    Ambulation Distance (Feet)  115 Feet    Assistive device  Rolling walker    Gait Pattern  Step-through pattern;Decreased hip/knee flexion - right;Decreased hip/knee flexion - left    Ambulation Surface  Level;Indoor      Exercises   Exercises  Other Exercises    Other Exercises   Showed pt how to perform prone quad step with strap for additional stretch, demonstrated how pt's husband can help loop belt around pt's foot and then pt on her elbows to gently pull for desired stretch - 3 x 30 seconds B. Pt reporting most relief from this stretch in anterior hips (the pain that has been most bothersome recently). Educated to perform this stretch daily.       Knee/Hip Exercises: Aerobic   Other Aerobic  SciFit level 2.0 with BLE and BUE for 6 minutes for strengthening,  activity tolerance, and ROM.       Knee/Hip Exercises: Standing   Forward Step Up  Both;1 set;10 reps;Hand Hold: 2;Step Height: 4"    Forward Step Up Limitations  with keeping each foot on the step - tapping other foot to the step, cues for slowed and controlled    Other Standing Knee Exercises  in // bars with BUE support at 4" block: 1 x 10 reps RLE step ups/step downs with LLE, pt able to demo improved ability to step RLE on step      Knee/Hip Exercises: Supine   Bridges  2 sets   8 reps   Bridges Limitations  pt with incr hip ADD when performing, therapist asking pt to gently push out into therapist's hands to activate hip ABD, progressed to use of red theraband as cue for hip ABD, provided as HEP             PT Education - 05/08/19 1604    Education Details  bridging with hip ABD with red theraband and prone quad stretch with strap addition to HEP    Person(s) Educated  Patient    Methods  Explanation;Demonstration    Comprehension  Verbalized understanding;Returned demonstration       PT Short Term Goals - 04/30/19 1506      PT SHORT TERM GOAL #1   Title  Patient will ambulate at least 300' outdoors over unlevel surfaces with LRAD as well as perform a curb with supervision in order to improve community mobility. ALL STGS DUE 05/28/19.    Time  4    Period  Weeks    Status  New    Target Date  05/28/19      PT SHORT TERM GOAL #2   Title  Patient will increase gait speed to at least 2.2 ft/sec with RW in order to decrease risk of falls.    Baseline  1.87 ft/sec with RW    Time  4    Period  Weeks    Status  New      PT SHORT TERM GOAL #3   Title  Patient will decrease 5 times sit <> stand score to 17 seconds or less with BUE support in order to improve functional LE strength.    Baseline  18.84 seconds on 04/30/19    Time  4    Period  Weeks    Status  New        PT Long Term Goals - 04/30/19 1509      PT LONG TERM GOAL #1   Title  Patient will be  independent with final HEP in order to build upon functional gains in therapy. ALL LTGS  DUE 06/25/19    Baseline  pt will benefit from on-going additions to HEP    Time  8    Period  Weeks    Status  On-going    Target Date  06/25/19      PT LONG TERM GOAL #2   Title  Patient will increase baseline BERG score to at least 48/56 in order to demo decr fall risk.    Baseline  46/56 on 04/30/19    Time  8    Period  Weeks    Status  New      PT LONG TERM GOAL #3   Title  Patient will ambulate at least 115' indoors with LRAD with supervision in order to improve household mobility.    Time  8    Period  Weeks    Status  New      PT LONG TERM GOAL #4   Title  Patient will perform TUG in 17 seconds or less with LRAD in order to demo decreased fall risk.    Baseline  18.4 seconds with RW    Time  8    Period  Weeks    Status  Revised      PT LONG TERM GOAL #5   Title  Pt will perform at least 2 out of 5 sit <> stands with no UE support from standard chair to demo improved functional LE strength.    Time  8    Period  Weeks    Status  New      PT LONG TERM GOAL #6   Title  Patient will improve gait speed to at least 2.5 ft/sec using LRAD in order to demo decreased fall risk and improved community mobility.    Baseline  17.47 seconds = 1.87 ft/sec with RW    Time  8    Period  Weeks    Status  On-going            Plan - 05/08/19 1611    Clinical Impression Statement  Focus of today's skilled session was LE strengthening, and prone quad/hip flexor stretching. Pt demonstrating improvements in LE strength from bridges and step ups. Able to perform 4" step up with less compensatory circumduction to raise lower extremity up on step. Pt reported relief after prone quad stretch with strap - had decrease pain at end of session. Educated for pt to perform at home with pt's spouse to help get strap on foot and assist, pt verbalized understanding. Will continue to progress towards LTGs.     Personal Factors and Comorbidities  Comorbidity 2;Past/Current Experience    Comorbidities  OSA, GERD    Examination-Activity Limitations  Locomotion Level;Transfers;Stairs;Stand    Examination-Participation Restrictions  Community Activity    Stability/Clinical Decision Making  Evolving/Moderate complexity    Rehab Potential  Good    PT Frequency  2x / week    PT Duration  8 weeks    PT Treatment/Interventions  ADLs/Self Care Home Management;Electrical Stimulation;Aquatic Therapy;Gait training;DME Instruction;Stair training;Functional mobility training;Therapeutic activities;Therapeutic exercise;Patient/family education;Neuromuscular re-education;Balance training;Passive range of motion    PT Next Visit Plan  go over pt's HEP - add/remove as necessary. hip ABD strengthening. Step ups/step taps on 4" step. NuStep/SciFit for LE strengthening and endurance. supine strengthening. continue sit <> stand training, standing balance activites/dynamic gait in // bars.  monitor fatigue.    PT Home Exercise Plan  [id number] plus seated hip ADD pillow squeezes    Consulted and Agree with Plan  of Care  Patient       Patient will benefit from skilled therapeutic intervention in order to improve the following deficits and impairments:  Abnormal gait, Decreased activity tolerance, Decreased coordination, Decreased balance, Decreased endurance, Decreased strength, Difficulty walking, Decreased mobility  Visit Diagnosis: Muscle weakness (generalized)  Other abnormalities of gait and mobility  Difficulty in walking, not elsewhere classified  Other symptoms and signs involving the nervous system     Problem List Patient Active Problem List   Diagnosis Date Noted  . Overactive bladder 03/28/2019  . Guillain Barr syndrome ([place]) 01/19/2019  . Cervical myofascial pain syndrome 12/16/2018  . Carpal tunnel syndrome 12/16/2018  . Axonal GBS (Guillain-Barre syndrome) ([place]) 12/15/2018  . Leg weakness,  bilateral 12/08/2018  . IBS (irritable bowel syndrome) 12/08/2018  . GERD (gastroesophageal reflux disease) 09/14/2018  . 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 Abdominal pain, epigastric 06/03/2017  . Constipation 01/27/2016  . History of adenomatous polyp of colon 01/27/2016    Arliss Journey, PT, DPT  05/08/2019, 10:23 PM  [facility] [address] [place], [place], [address] Phone: [phone]   Fax:  [phone]  Name: MACLAREN VAYNSHTEYN MRN: [id number] Date of Birth: 1956-01-09

## 2019-05-09 ENCOUNTER — Ambulatory Visit: Payer: Medicare Other | Admitting: Physical Therapy

## 2019-05-09 DIAGNOSIS — M6281 Muscle weakness (generalized): Secondary | ICD-10-CM

## 2019-05-09 DIAGNOSIS — R2689 Other abnormalities of gait and mobility: Secondary | ICD-10-CM | POA: Diagnosis not present

## 2019-05-09 DIAGNOSIS — R262 Difficulty in walking, not elsewhere classified: Secondary | ICD-10-CM | POA: Diagnosis not present

## 2019-05-09 DIAGNOSIS — R29818 Other symptoms and signs involving the nervous system: Secondary | ICD-10-CM | POA: Diagnosis not present

## 2019-05-10 ENCOUNTER — Encounter: Payer: Self-pay | Admitting: Physical Therapy

## 2019-05-10 ENCOUNTER — Other Ambulatory Visit: Payer: Self-pay

## 2019-05-10 NOTE — Therapy (Signed)
[facility] [address] [place], [place], [address] Phone: [phone]   Fax:  [phone]  Physical Therapy Treatment  Patient Details  Name: Paula Sutton MRN: [id number] Date of Birth: 09-02-1955 Referring Provider (PT): Courtney Heys, MD   Encounter Date: 05/09/2019  PT End of Session - 05/10/19 1854    Visit Number  29    Number of Visits  42    Date for PT Re-Evaluation  07/29/19    Authorization Type  [employer], Medicare Secondary    PT Start Time  1330    PT Stop Time  1415    PT Time Calculation (min)  45 min    Equipment Utilized During Treatment  Other (comment)   pool noodle, ankle buoyancy cuffs   Activity Tolerance  Patient tolerated treatment well    Behavior During Therapy  [facility] for tasks assessed/performed       Past Medical History:  Diagnosis Date  . Arthritis    knee  . Dyslipidemia   . GERD (gastroesophageal reflux disease)   . Left anterior fascicular block    PATIENT SAYS THIS SHOWED ON EKG DURING COLONOSCOPY , WAS REFERRED TO CARDIOLOGY DR. Minus Breeding  (SEE EPIC ENCOUNTER 09-2017)       Past Surgical History:  Procedure Laterality Date  . ABDOMINAL HYSTERECTOMY    . BIOPSY  08/09/2017   Procedure: BIOPSY;  Surgeon: Danie Binder, MD;  [facility];  Service: Endoscopy;;  gastric biopsy for h pylori  . CARPAL TUNNEL RELEASE Right 12/07/2018  . CATARACT EXTRACTION, BILATERAL     DID 1 YEAR APART , LAST ONE WAS 12-2017  . COLONOSCOPY WITH ESOPHAGOGASTRODUODENOSCOPY (EGD)  2014   [place] PROPOFOL N/A 08/09/2017   3 simple adenomas, diverticulosis in recto-sigmoid, sigmoid, and descending colin, moderate external and internal hemorrhoids  . ESOPHAGOGASTRODUODENOSCOPY (EGD) WITH PROPOFOL N/A 08/09/2017   Low-grade narrowing Schatzki ring due to GERD; small hiatal hernia; mild gastritis and duodenitis due to NSAIDs; biopsy with  gastritis  . KNEE ARTHROSCOPY Right 11/2017   WITH DR Wynelle Link  AT [place] Right   . POLYPECTOMY  08/09/2017   Procedure: POLYPECTOMY;  Surgeon: Danie Binder, MD;  [facility];  Service: Endoscopy;;  cecal polyp hs, transverse colon polyp hs, rectal polyp cs  . TOTAL KNEE REVISION Right 03/01/2018   Procedure: RIGHT TOTAL KNEE REVISION;  Surgeon: Gaynelle Arabian, MD;  Location: [facility];  Service: Orthopedics;  Laterality: Right;  134min    There were no vitals filed for this visit.  Subjective Assessment - 05/10/19 1853    Subjective  Denies any falls or changes.    Patient is accompained by:  Family member   husband   Pertinent History  GBS (diagnosed 9/20) history of OSA, GERD, recent CTR right hand    How long can you walk comfortably?  10-15 minutes or 260'    Diagnostic tests  MRI - had imaging done on brain last week, waiting for results -Had  a nerve conduction test last week: 50% on L, 25% on R    Patient Stated Goals  wants to strengthen R leg, wants to get well enough to go dinner and dancing.    Currently in Pain?  No/denies    Pain Onset  More than a month ago       Aquatic therapy at [facility] - pool temp 86.7degrees  Patient seen for aquatic therapy today. Treatment took place in water 3.5-4 feet deep depending upon activity. Pt entered and exited the pool via step negotiation step by step sequence with use of bil. hand rails with supervision:   Pt performed gait training in pool forwards 37m x 2 repswithout UE support then 55m x 2 reps backwards with pool noodle for UE support. Performed side steppingsquat holding bar bells x 51m x 2. Standing with UE support of pool edge for bil LE marching, hip extension, hip abd, hamstring curl.  Pt able to perform 15 reps each side.  Tolerated buoyancy cuff on L for all exercises.  Able to perform with buoyancy cuff on right for Hip extension, marching and abd but unable to extend knee with  hamstring curl so removed cuff. Seated on pool bench and performed sit<>stand x 15 reps bil LE's.     Seated for bil LE LAQ and hip flexion x 15 reps each with buoyancy cuffs. Difficulty with end range for knee extension.   Standing at pool edge for UE support x 10 reps then single leg x 10 reps each side.  Pt requires buoyancy for support with balance and viscosity for resistance for strengthening exercises; buoyancy is also needed for off loading body to assist with exercises.     PT Short Term Goals - 04/30/19 1506      PT SHORT TERM GOAL #1   Title  Patient will ambulate at least 300' outdoors over unlevel surfaces with LRAD as well as perform a curb with supervision in order to improve community mobility. ALL STGS DUE 05/28/19.    Time  4    Period  Weeks    Status  New    Target Date  05/28/19      PT SHORT TERM GOAL #2   Title  Patient will increase gait speed to at least 2.2 ft/sec with RW in order to decrease risk of falls.    Baseline  1.87 ft/sec with RW    Time  4    Period  Weeks    Status  New      PT SHORT TERM GOAL #3   Title  Patient will decrease 5 times sit <> stand score to 17 seconds or less with BUE support in order to improve functional LE strength.    Baseline  18.84 seconds on 04/30/19    Time  4    Period  Weeks    Status  New        PT Long Term Goals - 04/30/19 1509      PT LONG TERM GOAL #1   Title  Patient will be independent with final HEP in order to build upon functional gains in therapy. ALL LTGS DUE 06/25/19    Baseline  pt will benefit from on-going additions to HEP    Time  8    Period  Weeks    Status  On-going    Target Date  06/25/19      PT LONG TERM GOAL #2   Title  Patient will increase baseline BERG score to at least 48/56 in order to demo decr fall risk.    Baseline  46/56 on 04/30/19    Time  8    Period  Weeks    Status  New      PT LONG TERM GOAL #3   Title  Patient will ambulate at least 115' indoors with LRAD  with supervision in order to improve  household mobility.    Time  8    Period  Weeks    Status  New      PT LONG TERM GOAL #4   Title  Patient will perform TUG in 17 seconds or less with LRAD in order to demo decreased fall risk.    Baseline  18.4 seconds with RW    Time  8    Period  Weeks    Status  Revised      PT LONG TERM GOAL #5   Title  Pt will perform at least 2 out of 5 sit <> stands with no UE support from standard chair to demo improved functional LE strength.    Time  8    Period  Weeks    Status  New      PT LONG TERM GOAL #6   Title  Patient will improve gait speed to at least 2.5 ft/sec using LRAD in order to demo decreased fall risk and improved community mobility.    Baseline  17.47 seconds = 1.87 ft/sec with RW    Time  8    Period  Weeks    Status  On-going            Plan - 05/10/19 1854    Clinical Impression Statement  Pt progressing well with aquatic therapy program working on balance, strength and endurance.  Pt able to perform side step squats today with decreased floatation assistance.  Continue PT per POC.    Personal Factors and Comorbidities  Comorbidity 2;Past/Current Experience    Comorbidities  OSA, GERD    Examination-Activity Limitations  Locomotion Level;Transfers;Stairs;Stand    Examination-Participation Restrictions  Community Activity    Stability/Clinical Decision Making  Evolving/Moderate complexity    Rehab Potential  Good    PT Frequency  2x / week    PT Duration  8 weeks    PT Treatment/Interventions  ADLs/Self Care Home Management;Electrical Stimulation;Aquatic Therapy;Gait training;DME Instruction;Stair training;Functional mobility training;Therapeutic activities;Therapeutic exercise;Patient/family education;Neuromuscular re-education;Balance training;Passive range of motion    PT Next Visit Plan  go over pt's HEP - add/remove as necessary. hip ABD strengthening. Step ups/step taps on 4" step. NuStep/SciFit for LE strengthening  and endurance. supine strengthening. continue sit <> stand training, standing balance activites/dynamic gait in // bars.  monitor fatigue.    PT Home Exercise Plan  [id number] plus seated hip ADD pillow squeezes    Consulted and Agree with Plan of Care  Patient       Patient will benefit from skilled therapeutic intervention in order to improve the following deficits and impairments:  Abnormal gait, Decreased activity tolerance, Decreased coordination, Decreased balance, Decreased endurance, Decreased strength, Difficulty walking, Decreased mobility  Visit Diagnosis: Muscle weakness (generalized)  Other abnormalities of gait and mobility  Difficulty in walking, not elsewhere classified     Problem List Patient Active Problem List   Diagnosis Date Noted  . Overactive bladder 03/28/2019  . Guillain Barr syndrome ([place]) 01/19/2019  . Cervical myofascial pain syndrome 12/16/2018  . Carpal tunnel syndrome 12/16/2018  . 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 Leg weakness, bilateral 12/08/2018  . IBS (irritable bowel syndrome) 12/08/2018  . GERD (gastroesophageal reflux disease) 09/14/2018  . 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 Abdominal pain, epigastric 06/03/2017  . Constipation 01/27/2016  . History of adenomatous polyp of colon 01/27/2016    Narda Bonds, PTA [place] 05/10/19 7:02 PM Phone: [phone] Fax: [phone]   [facility] [address] [place], [place], [address] Phone: [phone]   Fax:  [phone]  Name: MONDA BELVEAL MRN: [id number] Date of Birth: 12/28/1955

## 2019-05-16 ENCOUNTER — Ambulatory Visit: Payer: Medicare Other | Admitting: Physical Therapy

## 2019-05-16 ENCOUNTER — Other Ambulatory Visit: Payer: Self-pay

## 2019-05-16 ENCOUNTER — Ambulatory Visit: Payer: Medicare Other | Attending: Physical Medicine and Rehabilitation | Admitting: Physical Therapy

## 2019-05-16 DIAGNOSIS — M6281 Muscle weakness (generalized): Secondary | ICD-10-CM | POA: Insufficient documentation

## 2019-05-16 DIAGNOSIS — R2689 Other abnormalities of gait and mobility: Secondary | ICD-10-CM | POA: Insufficient documentation

## 2019-05-16 DIAGNOSIS — R29818 Other symptoms and signs involving the nervous system: Secondary | ICD-10-CM | POA: Insufficient documentation

## 2019-05-16 DIAGNOSIS — R262 Difficulty in walking, not elsewhere classified: Secondary | ICD-10-CM | POA: Insufficient documentation

## 2019-05-17 ENCOUNTER — Ambulatory Visit: Payer: Medicare Other | Admitting: Physical Therapy

## 2019-05-17 DIAGNOSIS — M9902 Segmental and somatic dysfunction of thoracic region: Secondary | ICD-10-CM | POA: Diagnosis not present

## 2019-05-17 DIAGNOSIS — M546 Pain in thoracic spine: Secondary | ICD-10-CM | POA: Diagnosis not present

## 2019-05-17 DIAGNOSIS — M9901 Segmental and somatic dysfunction of cervical region: Secondary | ICD-10-CM | POA: Diagnosis not present

## 2019-05-17 DIAGNOSIS — M9903 Segmental and somatic dysfunction of lumbar region: Secondary | ICD-10-CM | POA: Diagnosis not present

## 2019-05-17 DIAGNOSIS — M47812 Spondylosis without myelopathy or radiculopathy, cervical region: Secondary | ICD-10-CM | POA: Diagnosis not present

## 2019-05-18 ENCOUNTER — Encounter: Payer: Self-pay | Admitting: Physical Therapy

## 2019-05-18 NOTE — Therapy (Signed)
[facility] [address] [place], [place], [address] Phone: [phone]   Fax:  [phone]  Physical Therapy Treatment  Patient Details  Name: Paula Sutton MRN: [id number] Date of Birth: [birth year] Referring Provider (PT): Courtney Heys, MD   Encounter Date: 05/16/2019  PT End of Session - 05/18/19 0913    Visit Number  30    Number of Visits  42    Date for PT Re-Evaluation  07/29/19    Authorization Type  [employer], Medicare Secondary    PT Start Time  1330    PT Stop Time  1415    PT Time Calculation (min)  45 min    Equipment Utilized During Treatment  Other (comment)   pool noodle, ankle buoyancy cuffs   Activity Tolerance  Patient tolerated treatment well    Behavior During Therapy  [facility] for tasks assessed/performed       Past Medical History:  Diagnosis Date  . Arthritis    knee  . Dyslipidemia   . GERD (gastroesophageal reflux disease)   . Left anterior fascicular block    PATIENT SAYS THIS SHOWED ON EKG DURING COLONOSCOPY , WAS REFERRED TO CARDIOLOGY DR. Minus Breeding  (SEE EPIC ENCOUNTER 09-2017)       Past Surgical History:  Procedure Laterality Date  . ABDOMINAL HYSTERECTOMY    . BIOPSY  08/09/2017   Procedure: BIOPSY;  Surgeon: Danie Binder, MD;  [facility];  Service: Endoscopy;;  gastric biopsy for h pylori  . CARPAL TUNNEL RELEASE Right 12/07/2018  . CATARACT EXTRACTION, BILATERAL     DID 1 YEAR APART , LAST ONE WAS 12-2017  . COLONOSCOPY WITH ESOPHAGOGASTRODUODENOSCOPY (EGD)  2014   [place] PROPOFOL N/A 08/09/2017   3 simple adenomas, diverticulosis in recto-sigmoid, sigmoid, and descending colin, moderate external and internal hemorrhoids  . ESOPHAGOGASTRODUODENOSCOPY (EGD) WITH PROPOFOL N/A 08/09/2017   Low-grade narrowing Schatzki ring due to GERD; small hiatal hernia; mild gastritis and duodenitis due to NSAIDs; biopsy with  gastritis  . KNEE ARTHROSCOPY Right 11/2017   WITH DR Wynelle Link  AT [place] Right   . POLYPECTOMY  08/09/2017   Procedure: POLYPECTOMY;  Surgeon: Danie Binder, MD;  [facility];  Service: Endoscopy;;  cecal polyp hs, transverse colon polyp hs, rectal polyp cs  . TOTAL KNEE REVISION Right 03/01/2018   Procedure: RIGHT TOTAL KNEE REVISION;  Surgeon: Gaynelle Arabian, MD;  Location[facility];  Service: Orthopedics;  Laterality: Right;  145min    There were no vitals filed for this visit.  Subjective Assessment - 05/18/19 0912    Subjective  Denies any falls or changes.  Has been walking around the house some without walker.    Patient is accompained by:  Family member   husband   Pertinent History  GBS (diagnosed 9/20) history of OSA, GERD, recent CTR right hand    How long can you walk comfortably?  10-15 minutes or 260'    Diagnostic tests  MRI - had imaging done on brain last week, waiting for results -Had  a nerve conduction test last week: 50% on L, 25% on R    Patient Stated Goals  wants to strengthen R leg, wants to get well enough to go dinner and dancing.    Currently in Pain?  No/denies       Aquatic therapy at [facility] - pool temp 86.7degrees  Patient seen for aquatic therapy today. Treatment took place in water 3.5-4 feet deep depending upon activity. Pt entered and exited the pool via step negotiation step by step sequence with use of bil. hand rails with supervision: Pt with improved control/strength on steps descending today.  Still has to use hand to lift LLE onto step with accending to be able to clear step.  Runners stretch bil LE's and toes/feet up edge of pool x 30 seconds each bil LE's.  Pt performed gait training in pool forwards 49m x 2 repswithout UE support then 22m x 2 repsbackwards with pool noodle for UE support. Performed side stepping squat holdingbar bellsx 57m x 2.  Standing with UE support of pool edge for bil LE  marching, hip extension, hip abd, hamstring curl.  Pt able to perform 15 reps each side.  Tolerated buoyancy cuff on L for all exercises.  Able to perform with buoyancy cuff on right for Hip extension, marching and abd but unable to extend knee with hamstring curl so removed cuff.  Squats while standing at pool edge for UE support x 10 reps then 10 reps without UE assist.  Performed single leg squats x 10 reps each side with UE support.  Forward step weight shifting x 10 reps each side without UE support for balance  Pt requires buoyancy for support with balance and viscosity for resistance for strengthening exercises; buoyancy is also needed for off loading body to assist with exercises.     PT Short Term Goals - 04/30/19 1506      PT SHORT TERM GOAL #1   Title  Patient will ambulate at least 300' outdoors over unlevel surfaces with LRAD as well as perform a curb with supervision in order to improve community mobility. ALL STGS DUE 05/28/19.    Time  4    Period  Weeks    Status  New    Target Date  05/28/19      PT SHORT TERM GOAL #2   Title  Patient will increase gait speed to at least 2.2 ft/sec with RW in order to decrease risk of falls.    Baseline  1.87 ft/sec with RW    Time  4    Period  Weeks    Status  New      PT SHORT TERM GOAL #3   Title  Patient will decrease 5 times sit <> stand score to 17 seconds or less with BUE support in order to improve functional LE strength.    Baseline  18.84 seconds on 04/30/19    Time  4    Period  Weeks    Status  New        PT Long Term Goals - 04/30/19 1509      PT LONG TERM GOAL #1   Title  Patient will be independent with final HEP in order to build upon functional gains in therapy. ALL LTGS DUE 06/25/19    Baseline  pt will benefit from on-going additions to HEP    Time  8    Period  Weeks    Status  On-going    Target Date  06/25/19      PT LONG TERM GOAL #2   Title  Patient will increase baseline BERG score to at  least 48/56 in order to demo decr fall risk.    Baseline  46/56 on 04/30/19    Time  8    Period  Weeks    Status  New      PT LONG TERM GOAL #3   Title  Patient will ambulate at least 115' indoors with LRAD with supervision in order to improve household mobility.    Time  8    Period  Weeks    Status  New      PT LONG TERM GOAL #4   Title  Patient will perform TUG in 17 seconds or less with LRAD in order to demo decreased fall risk.    Baseline  18.4 seconds with RW    Time  8    Period  Weeks    Status  Revised      PT LONG TERM GOAL #5   Title  Pt will perform at least 2 out of 5 sit <> stands with no UE support from standard chair to demo improved functional LE strength.    Time  8    Period  Weeks    Status  New      PT LONG TERM GOAL #6   Title  Patient will improve gait speed to at least 2.5 ft/sec using LRAD in order to demo decreased fall risk and improved community mobility.    Baseline  17.47 seconds = 1.87 ft/sec with RW    Time  8    Period  Weeks    Status  On-going            Plan - 05/18/19 0913    Clinical Impression Statement  Pt continues to progress well with aquatic program. Increased balance and strength in pool along with increased endurance.  Pt still has difficulty with R knee extension and unable to perform some exercises on R with buoyancy cuff.  Continue PT per POC.    Personal Factors and Comorbidities  Comorbidity 2;Past/Current Experience    Comorbidities  OSA, GERD    Examination-Activity Limitations  Locomotion Level;Transfers;Stairs;Stand    Examination-Participation Restrictions  Community Activity    Stability/Clinical Decision Making  Evolving/Moderate complexity    Rehab Potential  Good    PT Frequency  2x / week    PT Duration  8 weeks    PT Treatment/Interventions  ADLs/Self Care Home Management;Electrical Stimulation;Aquatic Therapy;Gait training;DME Instruction;Stair training;Functional mobility training;Therapeutic  activities;Therapeutic exercise;Patient/family education;Neuromuscular re-education;Balance training;Passive range of motion    PT Next Visit Plan  go over pt's HEP - add/remove as necessary. hip ABD strengthening. Step ups/step taps on 4" step. NuStep/SciFit for LE strengthening and endurance. supine strengthening. continue sit <> stand training, standing balance activites/dynamic gait in // bars.  monitor fatigue.    PT Home Exercise Plan  [id number] plus seated hip ADD pillow squeezes    Consulted and Agree with Plan of Care  Patient       Patient will benefit from skilled therapeutic intervention in order to improve the following deficits and impairments:  Abnormal gait, Decreased activity tolerance, Decreased coordination, Decreased balance, Decreased endurance, Decreased strength, Difficulty walking, Decreased mobility  Visit Diagnosis: Muscle weakness (generalized)  Other abnormalities of gait and mobility  Difficulty in walking, not elsewhere classified  Other symptoms and signs involving the nervous system     Problem List Patient Active Problem List   Diagnosis Date Noted  . Overactive bladder 03/28/2019  . Guillain Barr syndrome ([place]) 01/19/2019  . Cervical myofascial pain syndrome 12/16/2018  . Carpal tunnel syndrome 12/16/2018  . 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 Leg weakness, bilateral 12/08/2018  . IBS (irritable bowel syndrome) 12/08/2018  . GERD (gastroesophageal  reflux disease) 09/14/2018  . 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 Abdominal pain, epigastric 06/03/2017  . Constipation 01/27/2016  . History of adenomatous polyp of colon 01/27/2016    Narda Bonds, PTA [place] 05/18/19 9:20 AM Phone: [phone] Fax: [facility] [address] [place], [place], [address] Phone: [phone]   Fax:   [phone]  Name: Paula Sutton MRN: [id number] Date of Birth: 1955/12/08

## 2019-05-23 ENCOUNTER — Ambulatory Visit: Payer: Medicare Other | Admitting: Physical Therapy

## 2019-05-25 ENCOUNTER — Other Ambulatory Visit: Payer: Self-pay

## 2019-05-25 ENCOUNTER — Ambulatory Visit: Payer: Medicare Other | Admitting: Physical Therapy

## 2019-05-25 DIAGNOSIS — R29818 Other symptoms and signs involving the nervous system: Secondary | ICD-10-CM | POA: Diagnosis not present

## 2019-05-25 DIAGNOSIS — R262 Difficulty in walking, not elsewhere classified: Secondary | ICD-10-CM

## 2019-05-25 DIAGNOSIS — M6281 Muscle weakness (generalized): Secondary | ICD-10-CM

## 2019-05-25 DIAGNOSIS — R2689 Other abnormalities of gait and mobility: Secondary | ICD-10-CM | POA: Diagnosis not present

## 2019-05-25 NOTE — Therapy (Signed)
[facility] [address] [place], [place], [address] Phone: [phone]   Fax:  [phone]  Physical Therapy Treatment  Patient Details  Name: Paula Sutton MRN: [id number] Date of Birth: 10/11/1955 Referring Provider (PT): Courtney Heys, MD   Encounter Date: 05/25/2019  PT End of Session - 05/25/19 1534    Visit Number  31    Number of Visits  42    Date for PT Re-Evaluation  07/29/19    Authorization Type  [employer], Medicare Secondary    PT Start Time  1448    PT Stop Time  1530    PT Time Calculation (min)  42 min    Equipment Utilized During Treatment  Gait belt    Activity Tolerance  Patient tolerated treatment well    Behavior During Therapy  WFL for tasks assessed/performed       Past Medical History:  Diagnosis Date  . Arthritis    knee  . Dyslipidemia   . GERD (gastroesophageal reflux disease)   . Left anterior fascicular block    PATIENT SAYS THIS SHOWED ON EKG DURING COLONOSCOPY , WAS REFERRED TO CARDIOLOGY DR. Minus Breeding  (SEE EPIC ENCOUNTER 09-2017)       Past Surgical History:  Procedure Laterality Date  . ABDOMINAL HYSTERECTOMY    . BIOPSY  08/09/2017   Procedure: BIOPSY;  Surgeon: Danie Binder, MD;  [facility];  Service: Endoscopy;;  gastric biopsy for h pylori  . CARPAL TUNNEL RELEASE Right 12/07/2018  . CATARACT EXTRACTION, BILATERAL     DID 1 YEAR APART , LAST ONE WAS 12-2017  . COLONOSCOPY WITH ESOPHAGOGASTRODUODENOSCOPY (EGD)  2014   [place] PROPOFOL N/A 08/09/2017   3 simple adenomas, diverticulosis in recto-sigmoid, sigmoid, and descending colin, moderate external and internal hemorrhoids  . ESOPHAGOGASTRODUODENOSCOPY (EGD) WITH PROPOFOL N/A 08/09/2017   Low-grade narrowing Schatzki ring due to GERD; small hiatal hernia; mild gastritis and duodenitis due to NSAIDs; biopsy with gastritis  . KNEE ARTHROSCOPY Right 11/2017   WITH DR Wynelle Link  AT [place] Right   . POLYPECTOMY  08/09/2017   Procedure: POLYPECTOMY;  Surgeon: Danie Binder, MD;  [facility];  Service: Endoscopy;;  cecal polyp hs, transverse colon polyp hs, rectal polyp cs  . TOTAL KNEE REVISION Right 03/01/2018   Procedure: RIGHT TOTAL KNEE REVISION;  Surgeon: Gaynelle Arabian, MD;  Location: [facility];  Service: Orthopedics;  Laterality: Right;  123min    There were no vitals filed for this visit.  Subjective Assessment - 05/25/19 1452    Subjective  Had a bad fall on tuesday. stood up after her exercises/stretching and fell on her right side. has been trying to TENS unit and some ice. is still hurting. going to her PCP on monday.    Patient is accompained by:  Family member   husband   Pertinent History  GBS (diagnosed 9/20) history of OSA, GERD, recent CTR right hand    How long can you walk comfortably?  10-15 minutes or 260'    Diagnostic tests  MRI - had imaging done on brain last week, waiting for results -Had  a nerve conduction test last week: 50% on L, 25% on R    Patient Stated Goals  wants to strengthen R leg, wants to get well enough to go dinner and dancing.    Currently in Pain?  Yes  Pain Score  5     Pain Location  Hip    Pain Orientation  Right    Pain Descriptors / Indicators  Sore   very sore   Pain Type  Acute pain    Aggravating Factors   moving, bending over    Pain Relieving Factors  getting off feet/sitting down, ice                       [facility] Adult PT Treatment/Exercise - 05/25/19 0001      Transfers   Transfers  Sit to Stand;Stand to Sit    Sit to Stand  5: Supervision;From elevated surface;Without upper extremity assist      Exercises   Exercises  Other Exercises    Other Exercises   Verbally reviewed pt's HEP and proper reps/sets for each. discussed taking breaks between exercises and sitting before standing and making sure pt has RW near by (due to pt's  recent fall after standing up too quickly after performing stretches/exercises). At edge of mat table: red theraband around distal thighs for hip ABD activation: 1 x 10 sit <> stands with no UE support from bolster for incr extension activation of BLE, 1 x 10 reps sit <> stands with no UE support (from bolster) with 2" block under LLE for increased weight shifting/weight bearing through RLE, 1 x  10 reps mini squats with BUE support on RW, cues for proper technique. Standing with BUE support on chair: 1 x 10 reps side steps R and L, cues for technique. Standing in // bars: 1 x 10 reps marching with single UE support, 1 x 10 reps alternating SLS taps to 4" step with single UE support.       Knee/Hip Exercises: Standing   Forward Step Up  Both;1 set;10 reps;Hand Hold: 2;Step Height: 6";2 sets;5 sets    Forward Step Up Limitations  1 x 10 reps LLE, 2 x 5 reps RLE          Balance Exercises - 05/25/19 1736      Balance Exercises: Standing   Stepping Strategy  Lateral;Foam/compliant surface;10 reps;Limitations    Stepping Strategy Limitations  10 reps B with single UE support, pt with incr difficulty with LLE foot clearance on and off foam    Other Standing Exercises  standing on blue foam with single UE support: 1 x 10 reps heel > toe raises          PT Short Term Goals - 04/30/19 1506      PT SHORT TERM GOAL #1   Title  Patient will ambulate at least 300' outdoors over unlevel surfaces with LRAD as well as perform a curb with supervision in order to improve community mobility. ALL STGS DUE 05/28/19.    Time  4    Period  Weeks    Status  New    Target Date  05/28/19      PT SHORT TERM GOAL #2   Title  Patient will increase gait speed to at least 2.2 ft/sec with RW in order to decrease risk of falls.    Baseline  1.87 ft/sec with RW    Time  4    Period  Weeks    Status  New      PT SHORT TERM GOAL #3   Title  Patient will decrease 5 times sit <> stand score to 17 seconds or less  with BUE support in order to improve functional LE  strength.    Baseline  18.84 seconds on 04/30/19    Time  4    Period  Weeks    Status  New        PT Long Term Goals - 04/30/19 1509      PT LONG TERM GOAL #1   Title  Patient will be independent with final HEP in order to build upon functional gains in therapy. ALL LTGS DUE 06/25/19    Baseline  pt will benefit from on-going additions to HEP    Time  8    Period  Weeks    Status  On-going    Target Date  06/25/19      PT LONG TERM GOAL #2   Title  Patient will increase baseline BERG score to at least 48/56 in order to demo decr fall risk.    Baseline  46/56 on 04/30/19    Time  8    Period  Weeks    Status  New      PT LONG TERM GOAL #3   Title  Patient will ambulate at least 115' indoors with LRAD with supervision in order to improve household mobility.    Time  8    Period  Weeks    Status  New      PT LONG TERM GOAL #4   Title  Patient will perform TUG in 17 seconds or less with LRAD in order to demo decreased fall risk.    Baseline  18.4 seconds with RW    Time  8    Period  Weeks    Status  Revised      PT LONG TERM GOAL #5   Title  Pt will perform at least 2 out of 5 sit <> stands with no UE support from standard chair to demo improved functional LE strength.    Time  8    Period  Weeks    Status  New      PT LONG TERM GOAL #6   Title  Patient will improve gait speed to at least 2.5 ft/sec using LRAD in order to demo decreased fall risk and improved community mobility.    Baseline  17.47 seconds = 1.87 ft/sec with RW    Time  8    Period  Weeks    Status  On-going            Plan - 05/25/19 1536    Clinical Impression Statement  focus of today's session was LE strengthening. Reviewed pt's HEP and importance of taking rest breaks between each exercise as well as decr sets/reps (as pt reports overdoing some exercises). Pt needing intermittent rest breaks throughout session due to fatigue, especially  after step ups on RLE. Will continue to progress towards LTGs.    Personal Factors and Comorbidities  Comorbidity 2;Past/Current Experience    Comorbidities  OSA, GERD    Examination-Activity Limitations  Locomotion Level;Transfers;Stairs;Stand    Examination-Participation Restrictions  Community Activity    Stability/Clinical Decision Making  Evolving/Moderate complexity    Rehab Potential  Good    PT Frequency  2x / week    PT Duration  8 weeks    PT Treatment/Interventions  ADLs/Self Care Home Management;Electrical Stimulation;Aquatic Therapy;Gait training;DME Instruction;Stair training;Functional mobility training;Therapeutic activities;Therapeutic exercise;Patient/family education;Neuromuscular re-education;Balance training;Passive range of motion    PT Next Visit Plan  check STGs. hip ABD strengthening. Step ups/step taps on 4" step. NuStep/SciFit for LE strengthening and endurance. supine strengthening. continue sit <>  stand training, standing balance activites/dynamic gait in // bars.  monitor fatigue.    PT Home Exercise Plan  [id number] plus seated hip ADD pillow squeezes    Consulted and Agree with Plan of Care  Patient       Patient will benefit from skilled therapeutic intervention in order to improve the following deficits and impairments:  Abnormal gait, Decreased activity tolerance, Decreased coordination, Decreased balance, Decreased endurance, Decreased strength, Difficulty walking, Decreased mobility  Visit Diagnosis: Muscle weakness (generalized)  Other abnormalities of gait and mobility  Other symptoms and signs involving the nervous system  Difficulty in walking, not elsewhere classified     Problem List Patient Active Problem List   Diagnosis Date Noted  . Overactive bladder 03/28/2019  . Guillain Barr syndrome ([place]) 01/19/2019  . Cervical myofascial pain syndrome 12/16/2018  . Carpal tunnel syndrome 12/16/2018  . Axonal GBS (Guillain-Barre syndrome) ([place])  12/15/2018  . Leg weakness, bilateral 12/08/2018  . IBS (irritable bowel syndrome) 12/08/2018  . GERD (gastroesophageal reflux disease) 09/14/2018  . 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 Abdominal pain, epigastric 06/03/2017  . Constipation 01/27/2016  . History of adenomatous polyp of colon 01/27/2016    Arliss Journey, PT, DPT  05/25/2019, 5:37 PM  [facility] [address] [place], [place], [address] Phone: [phone]   Fax:  [phone]  Name: HADASSA LOFTON MRN: [id number] Date of Birth: 1956-02-13

## 2019-05-28 DIAGNOSIS — G610 Guillain-Barre syndrome: Secondary | ICD-10-CM | POA: Diagnosis not present

## 2019-05-28 DIAGNOSIS — M9903 Segmental and somatic dysfunction of lumbar region: Secondary | ICD-10-CM | POA: Diagnosis not present

## 2019-05-28 DIAGNOSIS — K581 Irritable bowel syndrome with constipation: Secondary | ICD-10-CM | POA: Diagnosis not present

## 2019-05-28 DIAGNOSIS — N951 Menopausal and female climacteric states: Secondary | ICD-10-CM | POA: Diagnosis not present

## 2019-05-28 DIAGNOSIS — H9201 Otalgia, right ear: Secondary | ICD-10-CM | POA: Diagnosis not present

## 2019-05-28 DIAGNOSIS — R519 Headache, unspecified: Secondary | ICD-10-CM | POA: Diagnosis not present

## 2019-05-28 DIAGNOSIS — M546 Pain in thoracic spine: Secondary | ICD-10-CM | POA: Diagnosis not present

## 2019-05-28 DIAGNOSIS — R5383 Other fatigue: Secondary | ICD-10-CM | POA: Diagnosis not present

## 2019-05-28 DIAGNOSIS — M47812 Spondylosis without myelopathy or radiculopathy, cervical region: Secondary | ICD-10-CM | POA: Diagnosis not present

## 2019-05-28 DIAGNOSIS — M9901 Segmental and somatic dysfunction of cervical region: Secondary | ICD-10-CM | POA: Diagnosis not present

## 2019-05-28 DIAGNOSIS — M9902 Segmental and somatic dysfunction of thoracic region: Secondary | ICD-10-CM | POA: Diagnosis not present

## 2019-05-28 DIAGNOSIS — R944 Abnormal results of kidney function studies: Secondary | ICD-10-CM | POA: Diagnosis not present

## 2019-05-28 DIAGNOSIS — E782 Mixed hyperlipidemia: Secondary | ICD-10-CM | POA: Diagnosis not present

## 2019-05-28 DIAGNOSIS — K5900 Constipation, unspecified: Secondary | ICD-10-CM | POA: Diagnosis not present

## 2019-05-29 ENCOUNTER — Other Ambulatory Visit: Payer: Self-pay

## 2019-05-29 ENCOUNTER — Ambulatory Visit: Payer: Medicare Other | Admitting: Physical Therapy

## 2019-05-29 DIAGNOSIS — R29818 Other symptoms and signs involving the nervous system: Secondary | ICD-10-CM

## 2019-05-29 DIAGNOSIS — R2689 Other abnormalities of gait and mobility: Secondary | ICD-10-CM | POA: Diagnosis not present

## 2019-05-29 DIAGNOSIS — M6281 Muscle weakness (generalized): Secondary | ICD-10-CM | POA: Diagnosis not present

## 2019-05-29 DIAGNOSIS — R262 Difficulty in walking, not elsewhere classified: Secondary | ICD-10-CM

## 2019-05-29 NOTE — Therapy (Signed)
[facility] [address] [place], [place], [address] Phone: [phone]   Fax:  [phone]  Physical Therapy Treatment  Patient Details  Name: Paula Sutton MRN: [id number] Date of Birth: 07-06-1955 Referring Provider (PT): Courtney Heys, MD   Encounter Date: 05/29/2019  PT End of Session - 05/29/19 1430    Visit Number  32    Number of Visits  42    Date for PT Re-Evaluation  07/29/19    Authorization Type  [employer], Medicare Secondary    PT Start Time  1315    PT Stop Time  1358    PT Time Calculation (min)  43 min    Equipment Utilized During Treatment  Gait belt    Activity Tolerance  Patient tolerated treatment well    Behavior During Therapy  WFL for tasks assessed/performed       Past Medical History:  Diagnosis Date  . Arthritis    knee  . Dyslipidemia   . GERD (gastroesophageal reflux disease)   . Left anterior fascicular block    PATIENT SAYS THIS SHOWED ON EKG DURING COLONOSCOPY , WAS REFERRED TO CARDIOLOGY DR. Minus Breeding  (SEE EPIC ENCOUNTER 09-2017)       Past Surgical History:  Procedure Laterality Date  . ABDOMINAL HYSTERECTOMY    . BIOPSY  08/09/2017   Procedure: BIOPSY;  Surgeon: Danie Binder, MD;  [facility];  Service: Endoscopy;;  gastric biopsy for h pylori  . CARPAL TUNNEL RELEASE Right 12/07/2018  . CATARACT EXTRACTION, BILATERAL     DID 1 YEAR APART , LAST ONE WAS 12-2017  . COLONOSCOPY WITH ESOPHAGOGASTRODUODENOSCOPY (EGD)  2014   [place] PROPOFOL N/A 08/09/2017   3 simple adenomas, diverticulosis in recto-sigmoid, sigmoid, and descending colin, moderate external and internal hemorrhoids  . ESOPHAGOGASTRODUODENOSCOPY (EGD) WITH PROPOFOL N/A 08/09/2017   Low-grade narrowing Schatzki ring due to GERD; small hiatal hernia; mild gastritis and duodenitis due to NSAIDs; biopsy with gastritis  . KNEE ARTHROSCOPY Right 11/2017    WITH DR Wynelle Link  AT [place] Right   . POLYPECTOMY  08/09/2017   Procedure: POLYPECTOMY;  Surgeon: Danie Binder, MD;  [facility];  Service: Endoscopy;;  cecal polyp hs, transverse colon polyp hs, rectal polyp cs  . TOTAL KNEE REVISION Right 03/01/2018   Procedure: RIGHT TOTAL KNEE REVISION;  Surgeon: Gaynelle Arabian, MD;  [facility];  Service: Orthopedics;  Laterality: Right;  161mn    There were no vitals filed for this visit.  Subjective Assessment - 05/29/19 1320    Subjective  went to the chiropractor yesterday, feeling much better. went to see her primary doctor, looking into getting a new neurologist.    Patient is accompained by:  Family member   husband   Pertinent History  GBS (diagnosed 9/20) history of OSA, GERD, recent CTR right hand    How long can you walk comfortably?  10-15 minutes or 260'    Diagnostic tests  MRI - had imaging done on brain last week, waiting for results -Had  a nerve conduction test last week: 50% on L, 25% on R    Patient Stated Goals  wants to strengthen R leg, wants to get well enough to go dinner and dancing.    Currently in Pain?  Yes    Pain Score  2     Pain Location  Hip  Pain Orientation  Right;Left    Pain Descriptors / Indicators  --   sore                      [facility] Adult PT Treatment/Exercise - 05/29/19 1326      Transfers   Transfers  Sit to Stand;Stand to Sit    Sit to Stand  5: Supervision;From chair/3-in-1;With upper extremity assist    Five time sit to stand comments   15.56 seconds on 05/29/19 with BUE support    Stand to Sit  Without upper extremity assist;With upper extremity assist;To chair/3-in-1    Comments  30 second chair stand = 9 sit <> stands with BUE support on standard arm chair       Ambulation/Gait   Ambulation/Gait  Yes    Ambulation/Gait Assistance  5: Supervision;4: Min guard    Ambulation/Gait Assistance Details  ambulates in and out of clinic with  mod I with RW, min guard with small distances during session with SPC with quad tip     Ambulation Distance (Feet)  30 Feet   x2   Assistive device  Rolling walker;Straight cane;Other (Comment)   with quad tip   Gait Pattern  Step-through pattern;Decreased hip/knee flexion - right;Decreased hip/knee flexion - left    Ambulation Surface  Level;Indoor    Gait velocity  12.97 seconds = 2.53 ft/sec      High Level Balance   High Level Balance Comments  In // bars with BUE support: forward reciprocal stepping down and back 5 reps over 4 obstacles (lower height beams on floor), cues for heel strike and incr hip/knee flexion when clearing obstacles (especially with RLE). Side stepping down and back over 3 floor beam obstacles down and back 5 reps progressing to single UE support, min guard for balance and cues for weight shift/foot clearance.       Neuro Re-ed    Neuro Re-ed Details          Exercises   Exercises  Other Exercises    Other Exercises   discussed safety when performing mini squats at home, pt currently performing without any chair support posteriorly, pt demonstated 1 rep of mini squats in // bars with BUE support and needed min A for balance due to squatting too low, therapist instructing pt to always perform with chair/or wheelchair posteriorly and to have BUE support at home for safety, pt verbalized understanding       Knee/Hip Exercises: Stretches   Other Knee/Hip Stretches  standing hip flexor lunge stretch with BUE support: cues for technique, 2 x  20 seconds B      Knee/Hip Exercises: Standing   Other Standing Knee Exercises  in // bars with BUE support: down and back 4 reps with red theraband around distal thighs for hip ABD strengthening, cues for step width           Balance Exercises - 05/29/19 1428      Balance Exercises: Standing   Rockerboard  EO;Lateral;Intermittent UE support;Limitations    Rockerboard Limitations  weight shifting R/L, cues for technique,  with additional manual assist at pelvis x10 reps     Other Standing Exercises  on rockerboard in M/L direction: 2 x 5 reps head turns, 1 x 5 reps head nods with intermittent UE support. on rockerboard in A/P direction: multi-directional reaching to grab bean bags from therapist and tossing into crate, with intermittent UE support, no overt LOB  PT Short Term Goals - 05/29/19 1324      PT SHORT TERM GOAL #1   Title  Patient will ambulate at least 300' outdoors over unlevel surfaces with LRAD as well as perform a curb with supervision in order to improve community mobility. ALL STGS DUE 05/28/19.    Baseline  deferred today due to weather.    Time  4    Period  Weeks    Status  Deferred    Target Date  05/28/19      PT SHORT TERM GOAL #2   Title  Patient will increase gait speed to at least 2.2 ft/sec with RW in order to decrease risk of falls.    Baseline  12.97 seconds = 2.53 ft/sec    Time  4    Period  Weeks    Status  Achieved      PT SHORT TERM GOAL #3   Title  Patient will decrease 5 times sit <> stand score to 17 seconds or less with BUE support in order to improve functional LE strength.    Baseline  15.56 seconds on 05/29/19 with BUE support    Time  4    Period  Weeks    Status  Achieved        PT Long Term Goals - 05/29/19 1435      PT LONG TERM GOAL #1   Title  Patient will be independent with final HEP in order to build upon functional gains in therapy. ALL LTGS DUE 06/25/19    Baseline  pt will benefit from on-going additions to HEP    Time  8    Period  Weeks    Status  On-going      PT LONG TERM GOAL #2   Title  Patient will increase baseline BERG score to at least 48/56 in order to demo decr fall risk.    Baseline  46/56 on 04/30/19    Time  8    Period  Weeks    Status  New      PT LONG TERM GOAL #3   Title  Patient will ambulate at least 115' indoors with LRAD with supervision in order to improve household mobility.    Time  8    Period   Weeks    Status  New      PT LONG TERM GOAL #4   Title  Patient will perform TUG in 17 seconds or less with LRAD in order to demo decreased fall risk.    Baseline  18.4 seconds with RW    Time  8    Period  Weeks    Status  Revised      PT LONG TERM GOAL #5   Title  Pt will perform at least 11 sit <> stands in 30 seconds with BUE support from standard arm chair in order to demo improved endurance and functional LE strength.    Baseline  9 on 05/29/19    Time  8    Period  Weeks    Status  Revised      PT LONG TERM GOAL #6   Title  Patient will improve gait speed to at least 2.2 ft/sec using SPC in order to demo decreased fall risk and improved household mobility.    Time  8    Period  Weeks    Status  Revised            Plan - 05/29/19 1434  Clinical Impression Statement  Focus of today's session was assessing pt's STGs. Pt has met 2 out of 3 STGs. Unable to assess STG #1 due to weather. Pt improved her gait speed with RW to 2.53 ft/sec (previously was 1.87 ft/sec) -decr pt's risk of falls. Pt improved 5x sit <> stand time with BUE support to 15.56 seconds (previously was 18.84 seconds). Pt able to perform 9 sit <> stands with 30 second chair stand today. Pt demonstrating improved eccentric control with no UE support when sitting in standard arm chair. Practiced gait again today with SPC with quad tip, had not practiced in a couple of months due to pt having a fall during gait with a cane and had increased fear. Pt performed well today and reported feeling stronger and more balance with gait today. Remainder of session focused on BLE strengthening,weight shifting, and balance reactions. Seated/standing rest breaks provided throughout due to fatigue. LTGs updated/revised as appropriate.    Personal Factors and Comorbidities  Comorbidity 2;Past/Current Experience    Comorbidities  OSA, GERD    Examination-Activity Limitations  Locomotion Level;Transfers;Stairs;Stand     Examination-Participation Restrictions  Community Activity    Stability/Clinical Decision Making  Evolving/Moderate complexity    Rehab Potential  Good    PT Frequency  2x / week    PT Duration  8 weeks    PT Treatment/Interventions  ADLs/Self Care Home Management;Electrical Stimulation;Aquatic Therapy;Gait training;DME Instruction;Stair training;Functional mobility training;Therapeutic activities;Therapeutic exercise;Patient/family education;Neuromuscular re-education;Balance training;Passive range of motion    PT Next Visit Plan  gait training with SPC vs. SPC with quad tip. hip ABD strengthening. Step ups/step taps on 6" step. NuStep/SciFit for LE strengthening and endurance. continue sit <> stand training, standing balance activites/dynamic gait in // bars with decr UE support.  monitor fatigue.    PT Home Exercise Plan  [id number] plus seated hip ADD pillow squeezes    Consulted and Agree with Plan of Care  Patient       Patient will benefit from skilled therapeutic intervention in order to improve the following deficits and impairments:  Abnormal gait, Decreased activity tolerance, Decreased coordination, Decreased balance, Decreased endurance, Decreased strength, Difficulty walking, Decreased mobility  Visit Diagnosis: Muscle weakness (generalized)  Other abnormalities of gait and mobility  Other symptoms and signs involving the nervous system  Difficulty in walking, not elsewhere classified     Problem List Patient Active Problem List   Diagnosis Date Noted  . Overactive bladder 03/28/2019  . Guillain Barr syndrome ([place]) 01/19/2019  . Cervical myofascial pain syndrome 12/16/2018  . Carpal tunnel syndrome 12/16/2018  . Axonal GBS (Guillain-Barre syndrome) ([place]) 12/15/2018  . Leg weakness, bilateral 12/08/2018  . IBS (irritable bowel syndrome) 12/08/2018  . GERD (gastroesophageal reflux disease) 09/14/2018  . 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 Abdominal  pain, epigastric 06/03/2017  . Constipation 01/27/2016  . History of adenomatous polyp of colon 01/27/2016    Arliss Journey, PT, DPT  05/29/2019, 2:38 PM  [facility] [address] [place], [place], [address] Phone: [phone]   Fax:  [phone]  Name: JETTE LEWAN MRN: [id number] Date of Birth: [birth year]

## 2019-05-30 ENCOUNTER — Encounter: Payer: Self-pay | Admitting: Physical Therapy

## 2019-05-30 ENCOUNTER — Ambulatory Visit: Payer: Medicare Other | Admitting: Physical Therapy

## 2019-05-30 DIAGNOSIS — M6281 Muscle weakness (generalized): Secondary | ICD-10-CM | POA: Diagnosis not present

## 2019-05-30 DIAGNOSIS — R262 Difficulty in walking, not elsewhere classified: Secondary | ICD-10-CM | POA: Diagnosis not present

## 2019-05-30 DIAGNOSIS — R29818 Other symptoms and signs involving the nervous system: Secondary | ICD-10-CM | POA: Diagnosis not present

## 2019-05-30 DIAGNOSIS — R2689 Other abnormalities of gait and mobility: Secondary | ICD-10-CM

## 2019-05-31 NOTE — Therapy (Signed)
[facility] [address] [place], [place], [address] Phone: [phone]   Fax:  [phone]  Physical Therapy Treatment  Patient Details  Name: LAVANYA MINCE MRN: [id number] Date of Birth: 1955-09-27 Referring Provider (PT): Courtney Heys, MD   Encounter Date: 05/30/2019  PT End of Session - 05/30/19 1236    Visit Number  33    Number of Visits  42    Date for PT Re-Evaluation  07/29/19    Authorization Type  [employer], Medicare Secondary    PT Start Time  1233    PT Stop Time  1315    PT Time Calculation (min)  42 min    Equipment Utilized During Treatment  Gait belt    Activity Tolerance  Patient tolerated treatment well    Behavior During Therapy  WFL for tasks assessed/performed       Past Medical History:  Diagnosis Date  . Arthritis    knee  . Dyslipidemia   . GERD (gastroesophageal reflux disease)   . Left anterior fascicular block    PATIENT SAYS THIS SHOWED ON EKG DURING COLONOSCOPY , WAS REFERRED TO CARDIOLOGY DR. Minus Breeding  (SEE EPIC ENCOUNTER 09-2017)       Past Surgical History:  Procedure Laterality Date  . ABDOMINAL HYSTERECTOMY    . BIOPSY  08/09/2017   Procedure: BIOPSY;  Surgeon: Danie Binder, MD;  [facility];  Service: Endoscopy;;  gastric biopsy for h pylori  . CARPAL TUNNEL RELEASE Right 12/07/2018  . CATARACT EXTRACTION, BILATERAL     DID 1 YEAR APART , LAST ONE WAS 12-2017  . COLONOSCOPY WITH ESOPHAGOGASTRODUODENOSCOPY (EGD)  2014   [place] PROPOFOL N/A 08/09/2017   3 simple adenomas, diverticulosis in recto-sigmoid, sigmoid, and descending colin, moderate external and internal hemorrhoids  . ESOPHAGOGASTRODUODENOSCOPY (EGD) WITH PROPOFOL N/A 08/09/2017   Low-grade narrowing Schatzki ring due to GERD; small hiatal hernia; mild gastritis and duodenitis due to NSAIDs; biopsy with gastritis  . KNEE ARTHROSCOPY Right 11/2017   WITH DR Wynelle Link  AT [facility] Right   . POLYPECTOMY  08/09/2017   Procedure: POLYPECTOMY;  Surgeon: Danie Binder, MD;  [facility];  Service: Endoscopy;;  cecal polyp hs, transverse colon polyp hs, rectal polyp cs  . TOTAL KNEE REVISION Right 03/01/2018   Procedure: RIGHT TOTAL KNEE REVISION;  Surgeon: Gaynelle Arabian, MD;  Location: [facility];  Service: Orthopedics;  Laterality: Right;  172min    There were no vitals filed for this visit.  Subjective Assessment - 05/30/19 1235    Subjective  No new complaints. Knee is better today.    Pertinent History  GBS (diagnosed 9/20) history of OSA, GERD, recent CTR right hand    How long can you walk comfortably?  10-15 minutes or 260'    Diagnostic tests  MRI - had imaging done on brain last week, waiting for results -Had  a nerve conduction test last week: 50% on L, 25% on R    Patient Stated Goals  wants to strengthen R leg, wants to get well enough to go dinner and dancing.    Currently in Pain?  No/denies    Pain Score  0-No pain            [facility] Adult PT Treatment/Exercise - 05/30/19 1239      Transfers   Transfers  Sit to Stand;Stand to Sit  Sit to Stand  5: Supervision;With upper extremity assist;From bed;From chair/3-in-1    Stand to Sit  5: Supervision;With upper extremity assist;To bed;To chair/3-in-1      Ambulation/Gait   Ambulation/Gait  Yes    Ambulation/Gait Assistance  5: Supervision;4: Min guard    Ambulation/Gait Assistance Details  no episodes of knee buckling or balance loss with gait today. cues for posture and cane placement at times.     Ambulation Distance (Feet)  42 Feet   x1, 40 x1, 44 x1   Assistive device  Rolling walker;Straight cane;Other (Comment)   cane with rubber quad tip   Gait Pattern  Step-through pattern;Decreased hip/knee flexion - right;Decreased hip/knee flexion - left    Ambulation Surface  Level;Indoor      Knee/Hip Exercises: Aerobic   Nustep  Level 5  with LE's mostly, occasional UE assist for 6 minutes with goal >/-45 steps per minute for strengthening and activity tolerance      Knee/Hip Exercises: Standing   Lateral Step Up  Both;1 set;10 reps;Hand Hold: 2;Step Height: 6";Limitations    Lateral Step Up Limitations  at bottom of stairs with bil UE support with guarding for right knee when performing step ups with it for safety. increased time and effort needed with use of right LE for step ups.     Forward Step Up  Both;1 set;10 reps;Hand Hold: 2;Step Height: 6";Limitations    Forward Step Up Limitations  at bottom of stairs with bil UE support- guarding of right knee with stepping up for safety with increased time and effort needed.           Balance Exercises - 05/30/19 1300      Balance Exercises: Standing   Standing Eyes Closed  Narrow base of support (BOS);Head turns;Foam/compliant surface;Other reps (comment);Limitations;Wide (BOA)    Standing Eyes Closed Limitations  standing on 2 one inch foams: feet together for EC no head movements, progressing to feet hip width apart for EC head movements left<>right, up<>down, and diagonals both ways for 10 reps each. min guard to min assist for balance with no UE support. cues on posture and weight shifting to assist with regaining balance.            PT Short Term Goals - 05/29/19 1324      PT SHORT TERM GOAL #1   Title  Patient will ambulate at least 300' outdoors over unlevel surfaces with LRAD as well as perform a curb with supervision in order to improve community mobility. ALL STGS DUE 05/28/19.    Baseline  deferred today due to weather.    Time  4    Period  Weeks    Status  Deferred    Target Date  05/28/19      PT SHORT TERM GOAL #2   Title  Patient will increase gait speed to at least 2.2 ft/sec with RW in order to decrease risk of falls.    Baseline  12.97 seconds = 2.53 ft/sec    Time  4    Period  Weeks    Status  Achieved      PT SHORT TERM GOAL #3   Title   Patient will decrease 5 times sit <> stand score to 17 seconds or less with BUE support in order to improve functional LE strength.    Baseline  15.56 seconds on 05/29/19 with BUE support    Time  4    Period  Weeks    Status  Achieved  PT Long Term Goals - 05/29/19 1435      PT LONG TERM GOAL #1   Title  Patient will be independent with final HEP in order to build upon functional gains in therapy. ALL LTGS DUE 06/25/19    Baseline  pt will benefit from on-going additions to HEP    Time  8    Period  Weeks    Status  On-going      PT LONG TERM GOAL #2   Title  Patient will increase baseline BERG score to at least 48/56 in order to demo decr fall risk.    Baseline  46/56 on 04/30/19    Time  8    Period  Weeks    Status  New      PT LONG TERM GOAL #3   Title  Patient will ambulate at least 115' indoors with LRAD with supervision in order to improve household mobility.    Time  8    Period  Weeks    Status  New      PT LONG TERM GOAL #4   Title  Patient will perform TUG in 17 seconds or less with LRAD in order to demo decreased fall risk.    Baseline  18.4 seconds with RW    Time  8    Period  Weeks    Status  Revised      PT LONG TERM GOAL #5   Title  Pt will perform at least 11 sit <> stands in 30 seconds with BUE support from standard arm chair in order to demo improved endurance and functional LE strength.    Baseline  9 on 05/29/19    Time  8    Period  Weeks    Status  Revised      PT LONG TERM GOAL #6   Title  Patient will improve gait speed to at least 2.2 ft/sec using SPC in order to demo decreased fall risk and improved household mobility.    Time  8    Period  Weeks    Status  Revised            Plan - 05/30/19 1236    Clinical Impression Statement  Today's skilled session continued to focus on gait with cane, LE strengthening and balance reaction training on compliant surfaces. No knee buckling with session today or increase in pain reported. The  pt is making steady progress toward goals and should benefit from continued PT to progress toward unmet goals.    Personal Factors and Comorbidities  Comorbidity 2;Past/Current Experience    Comorbidities  OSA, GERD    Examination-Activity Limitations  Locomotion Level;Transfers;Stairs;Stand    Examination-Participation Restrictions  Community Activity    Stability/Clinical Decision Making  Evolving/Moderate complexity    Rehab Potential  Good    PT Frequency  2x / week    PT Duration  8 weeks    PT Treatment/Interventions  ADLs/Self Care Home Management;Electrical Stimulation;Aquatic Therapy;Gait training;DME Instruction;Stair training;Functional mobility training;Therapeutic activities;Therapeutic exercise;Patient/family education;Neuromuscular re-education;Balance training;Passive range of motion    PT Next Visit Plan  gait training with SPC vs. SPC with quad tip. hip ABD strengthening. Step ups/step taps on 6" step. NuStep/SciFit for LE strengthening and endurance. continue sit <> stand training, standing balance activites/dynamic gait in // bars with decr UE support.  monitor fatigue.    PT Home Exercise Plan  [id number] plus seated hip ADD pillow squeezes    Consulted and Agree with Plan of  Care  Patient       Patient will benefit from skilled therapeutic intervention in order to improve the following deficits and impairments:  Abnormal gait, Decreased activity tolerance, Decreased coordination, Decreased balance, Decreased endurance, Decreased strength, Difficulty walking, Decreased mobility  Visit Diagnosis: Muscle weakness (generalized)  Other abnormalities of gait and mobility  Other symptoms and signs involving the nervous system  Difficulty in walking, not elsewhere classified     Problem List Patient Active Problem List   Diagnosis Date Noted  . Overactive bladder 03/28/2019  . Guillain Barr syndrome ([place]) 01/19/2019  . Cervical myofascial pain syndrome 12/16/2018  .  Carpal tunnel syndrome 12/16/2018  . 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 Leg weakness, bilateral 12/08/2018  . IBS (irritable bowel syndrome) 12/08/2018  . GERD (gastroesophageal reflux disease) 09/14/2018  . 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 Abdominal pain, epigastric 06/03/2017  . Constipation 01/27/2016  . History of adenomatous polyp of colon 01/27/2016    Willow Ora, PTA, [facility] [address] [place], [place] [address] [phone] 05/31/19, 10:21 AM   Name: JOHNAY LEBOWITZ MRN: [id number] Date of Birth: 05-18-1955

## 2019-06-01 ENCOUNTER — Ambulatory Visit: Payer: Medicare Other | Admitting: Physical Therapy

## 2019-06-05 ENCOUNTER — Ambulatory Visit: Payer: Medicare Other | Admitting: Physical Therapy

## 2019-06-05 ENCOUNTER — Other Ambulatory Visit: Payer: Self-pay

## 2019-06-05 DIAGNOSIS — M6281 Muscle weakness (generalized): Secondary | ICD-10-CM

## 2019-06-05 DIAGNOSIS — R2689 Other abnormalities of gait and mobility: Secondary | ICD-10-CM

## 2019-06-05 DIAGNOSIS — R262 Difficulty in walking, not elsewhere classified: Secondary | ICD-10-CM | POA: Diagnosis not present

## 2019-06-05 DIAGNOSIS — R29818 Other symptoms and signs involving the nervous system: Secondary | ICD-10-CM

## 2019-06-05 NOTE — Therapy (Signed)
[facility] [address] [place], [place], [address] Phone: [phone]   Fax:  [phone]  Physical Therapy Treatment  Patient Details  Name: Paula Sutton MRN: [id number] Date of Birth: Feb 22, 1956 Referring Provider (PT): Courtney Heys, MD   Encounter Date: 06/05/2019  PT End of Session - 06/05/19 1642    Visit Number  34    Number of Visits  42    Date for PT Re-Evaluation  07/29/19    Authorization Type  [employer], Medicare Secondary    PT Start Time  1316    PT Stop Time  1358    PT Time Calculation (min)  42 min    Equipment Utilized During Treatment  Gait belt    Activity Tolerance  Patient tolerated treatment well    Behavior During Therapy  WFL for tasks assessed/performed       Past Medical History:  Diagnosis Date  . Arthritis    knee  . Dyslipidemia   . GERD (gastroesophageal reflux disease)   . Left anterior fascicular block    PATIENT SAYS THIS SHOWED ON EKG DURING COLONOSCOPY , WAS REFERRED TO CARDIOLOGY DR. Minus Breeding  (SEE EPIC ENCOUNTER 09-2017)       Past Surgical History:  Procedure Laterality Date  . ABDOMINAL HYSTERECTOMY    . BIOPSY  08/09/2017   Procedure: BIOPSY;  Surgeon: Danie Binder, MD;  [facility];  Service: Endoscopy;;  gastric biopsy for h pylori  . CARPAL TUNNEL RELEASE Right 12/07/2018  . CATARACT EXTRACTION, BILATERAL     DID 1 YEAR APART , LAST ONE WAS 12-2017  . COLONOSCOPY WITH ESOPHAGOGASTRODUODENOSCOPY (EGD)  2014   [place] PROPOFOL N/A 08/09/2017   3 simple adenomas, diverticulosis in recto-sigmoid, sigmoid, and descending colin, moderate external and internal hemorrhoids  . ESOPHAGOGASTRODUODENOSCOPY (EGD) WITH PROPOFOL N/A 08/09/2017   Low-grade narrowing Schatzki ring due to GERD; small hiatal hernia; mild gastritis and duodenitis due to NSAIDs; biopsy with gastritis  . KNEE ARTHROSCOPY Right 11/2017   WITH DR Wynelle Link  AT [facility] Right   . POLYPECTOMY  08/09/2017   Procedure: POLYPECTOMY;  Surgeon: Danie Binder, MD;  [facility];  Service: Endoscopy;;  cecal polyp hs, transverse colon polyp hs, rectal polyp cs  . TOTAL KNEE REVISION Right 03/01/2018   Procedure: RIGHT TOTAL KNEE REVISION;  Surgeon: Gaynelle Arabian, MD;  [facility];  Service: Orthopedics;  Laterality: Right;  18min    There were no vitals filed for this visit.  Subjective Assessment - 06/05/19 1319    Subjective  Started taking Wellbutrin. No falls - not even almost falls. R hip has been hurting a lot - feels like it is full of knots. Tried using her knuckles to rub it out and reports it felt good. Tried both heat and ice and they provide temporary relief.    Pertinent History  GBS (diagnosed 9/20) history of OSA, GERD, recent CTR right hand    How long can you walk comfortably?  10-15 minutes or 260'    Diagnostic tests  MRI - had imaging done on brain last week, waiting for results -Had  a nerve conduction test last week: 50% on L, 25% on R    Patient Stated Goals  wants to strengthen R leg, wants to get well enough to go dinner and dancing.    Currently in Pain?  No/denies  [facility] PT Assessment - 06/05/19 0001      Palpation   Palpation comment  pt reporting increased pain/tenderness over R hip  that started approx. 2 weeks ago after her fall and has been rubbing it with her knuckles at home and it has been feeling better. therapist palpated with increased TTP to proximal IT band, therapist performed massage to proximal IT band (with hands and with tennis ball) and sidelying IT band stretch, pt reporting relief to area after stretches and exercises, provided pt with tennis ball for home to provide self massage.  Educated pt that if pain gets worse vs. better then to let her doctor be aware, pt verbalized understanding.                    [facility] Adult PT  Treatment/Exercise - 06/05/19 0001      Ambulation/Gait   Ambulation/Gait  Yes    Ambulation/Gait Assistance  5: Supervision;4: Min guard    Ambulation/Gait Assistance Details  no episodes of LOB or knee instability during during gait during smaller distances between activities. pt ambulates with mod I with RW into session      Ambulation Distance (Feet)  30 Feet   x2   Assistive device  Rolling walker;Straight cane;Other (Comment)    Gait Pattern  Step-through pattern;Decreased hip/knee flexion - right;Decreased hip/knee flexion - left    Ambulation Surface  Level;Indoor      Exercises   Exercises  Other Exercises    Other Exercises   Sit <> stands from elevated mat table with 2" block under LLE for weight shifting towards R, green theraband around distal thighs for cues for hip ABD 1 x 10 reps - no UE support, with 2" block under LLE and green theraband buttocks raises off mat table and holds for 2-3 seconds before slowly lowering 1 x 10 reps. With BUE support: while holding mini squat side steps R x10 reps and then side steps L x10 reps with green band. Standing in // bars: with intermittent UE support and 2# ankle weight: lateral step taps to 4" step for strengthening and foot clearance 1 x 10 reps B.           Balance Exercises - 06/05/19 1649      Balance Exercises: Standing   SLS  Intermittent upper extremity support;Eyes open;Solid surface;Limitations    SLS Limitations  in // bars: progressing from single UE support to no UE support: colorful floor bubble alternating R/L SLS taps 1 x 10 reps    Stepping Strategy  Anterior;Posterior;Foam/compliant surface;10 reps;UE support    Stepping Strategy Limitations  10 reps alternating anterior steps, 10 reps alternating posterior steps - cues for foot clerance     Sidestepping  3 reps;Foam/compliant support;Limitations    Sidestepping Limitations  down and back 3 reps with intermittent UE support, cues for foot clearance and weight shift           PT Short Term Goals - 05/29/19 1324      PT SHORT TERM GOAL #1   Title  Patient will ambulate at least 300' outdoors over unlevel surfaces with LRAD as well as perform a curb with supervision in order to improve community mobility. ALL STGS DUE 05/28/19.    Baseline  deferred today due to weather.    Time  4    Period  Weeks    Status  Deferred    Target Date  05/28/19      PT SHORT TERM  GOAL #2   Title  Patient will increase gait speed to at least 2.2 ft/sec with RW in order to decrease risk of falls.    Baseline  12.97 seconds = 2.53 ft/sec    Time  4    Period  Weeks    Status  Achieved      PT SHORT TERM GOAL #3   Title  Patient will decrease 5 times sit <> stand score to 17 seconds or less with BUE support in order to improve functional LE strength.    Baseline  15.56 seconds on 05/29/19 with BUE support    Time  4    Period  Weeks    Status  Achieved        PT Long Term Goals - 05/29/19 1435      PT LONG TERM GOAL #1   Title  Patient will be independent with final HEP in order to build upon functional gains in therapy. ALL LTGS DUE 06/25/19    Baseline  pt will benefit from on-going additions to HEP    Time  8    Period  Weeks    Status  On-going      PT LONG TERM GOAL #2   Title  Patient will increase baseline BERG score to at least 48/56 in order to demo decr fall risk.    Baseline  46/56 on 04/30/19    Time  8    Period  Weeks    Status  New      PT LONG TERM GOAL #3   Title  Patient will ambulate at least 115' indoors with LRAD with supervision in order to improve household mobility.    Time  8    Period  Weeks    Status  New      PT LONG TERM GOAL #4   Title  Patient will perform TUG in 17 seconds or less with LRAD in order to demo decreased fall risk.    Baseline  18.4 seconds with RW    Time  8    Period  Weeks    Status  Revised      PT LONG TERM GOAL #5   Title  Pt will perform at least 11 sit <> stands in 30 seconds with BUE support  from standard arm chair in order to demo improved endurance and functional LE strength.    Baseline  9 on 05/29/19    Time  8    Period  Weeks    Status  Revised      PT LONG TERM GOAL #6   Title  Patient will improve gait speed to at least 2.2 ft/sec using SPC in order to demo decreased fall risk and improved household mobility.    Time  8    Period  Weeks    Status  Revised            Plan - 06/05/19 1651    Clinical Impression Statement  Today's skilled session continued to focus on balance strategies and BLE strengthening. Pt TTP over proximal IT band - however at end of session after massage, stretches, and exercises pt reporting her R lateral hip felt much better. With smaller distances with cane pt with no knee instability. Pt able to progress SLS and could perform a couple of reps of alternating taps without UE support. Brief intermittent rest breaks taken throughout. Will continue to progress towards LTGs.    Personal Factors and Comorbidities  Comorbidity 2;Past/Current  Experience    Comorbidities  OSA, GERD    Examination-Activity Limitations  Locomotion Level;Transfers;Stairs;Stand    Examination-Participation Restrictions  Community Activity    Stability/Clinical Decision Making  Evolving/Moderate complexity    Rehab Potential  Good    PT Frequency  2x / week    PT Duration  8 weeks    PT Treatment/Interventions  ADLs/Self Care Home Management;Electrical Stimulation;Aquatic Therapy;Gait training;DME Instruction;Stair training;Functional mobility training;Therapeutic activities;Therapeutic exercise;Patient/family education;Neuromuscular re-education;Balance training;Passive range of motion    PT Next Visit Plan  gait training with SPC with quad tip. hip ABD strengthening. Step ups/step taps on 6" step. NuStep/SciFit for LE strengthening and endurance. continue sit <> stand training, standing balance activites/dynamic gait in // bars with decr UE support.  monitor fatigue.     PT Home Exercise Plan  [id number] plus seated hip ADD pillow squeezes    Consulted and Agree with Plan of Care  Patient       Patient will benefit from skilled therapeutic intervention in order to improve the following deficits and impairments:  Abnormal gait, Decreased activity tolerance, Decreased coordination, Decreased balance, Decreased endurance, Decreased strength, Difficulty walking, Decreased mobility  Visit Diagnosis: Other abnormalities of gait and mobility  Muscle weakness (generalized)  Other symptoms and signs involving the nervous system  Difficulty in walking, not elsewhere classified     Problem List Patient Active Problem List   Diagnosis Date Noted  . Overactive bladder 03/28/2019  . Guillain Barr syndrome ([place]) 01/19/2019  . Cervical myofascial pain syndrome 12/16/2018  . Carpal tunnel syndrome 12/16/2018  . Axonal GBS (Guillain-Barre syndrome) ([place]) 12/15/2018  . Leg weakness, bilateral 12/08/2018  . IBS (irritable bowel syndrome) 12/08/2018  . GERD (gastroesophageal reflux disease) 09/14/2018  . 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 Abdominal pain, epigastric 06/03/2017  . Constipation 01/27/2016  . History of adenomatous polyp of colon 01/27/2016    Arliss Journey , PT, DPT  06/05/2019, 4:53 PM  [facility] [address] [place], [place], [address] Phone: [phone]   Fax:  [phone]  Name: ROBERTHA FICARRA MRN: [id number] Date of Birth: [birth year]

## 2019-06-08 ENCOUNTER — Ambulatory Visit: Payer: Medicare Other | Admitting: Physical Therapy

## 2019-06-08 ENCOUNTER — Other Ambulatory Visit: Payer: Self-pay

## 2019-06-08 DIAGNOSIS — R2689 Other abnormalities of gait and mobility: Secondary | ICD-10-CM

## 2019-06-08 DIAGNOSIS — R262 Difficulty in walking, not elsewhere classified: Secondary | ICD-10-CM | POA: Diagnosis not present

## 2019-06-08 DIAGNOSIS — R29818 Other symptoms and signs involving the nervous system: Secondary | ICD-10-CM | POA: Diagnosis not present

## 2019-06-08 DIAGNOSIS — M6281 Muscle weakness (generalized): Secondary | ICD-10-CM | POA: Diagnosis not present

## 2019-06-08 NOTE — Therapy (Signed)
[facility] [address] [place], [place], [address] Phone: [phone]   Fax:  [phone]  Physical Therapy Treatment  Patient Details  Name: Paula Sutton MRN: [id number] Date of Birth: 12/28/1955 Referring Provider (PT): Courtney Heys, MD   Encounter Date: 06/08/2019  PT End of Session - 06/08/19 1606    Visit Number  35    Number of Visits  42    Date for PT Re-Evaluation  07/29/19    Authorization Type  [employer], Medicare Secondary    PT Start Time  [id number]    PT Stop Time  1358    PT Time Calculation (min)  41 min    Equipment Utilized During Treatment  Gait belt    Activity Tolerance  Patient tolerated treatment well    Behavior During Therapy  WFL for tasks assessed/performed       Past Medical History:  Diagnosis Date  . Arthritis    knee  . Dyslipidemia   . GERD (gastroesophageal reflux disease)   . Left anterior fascicular block    PATIENT SAYS THIS SHOWED ON EKG DURING COLONOSCOPY , WAS REFERRED TO CARDIOLOGY DR. Minus Breeding  (SEE EPIC ENCOUNTER 09-2017)       Past Surgical History:  Procedure Laterality Date  . ABDOMINAL HYSTERECTOMY    . BIOPSY  08/09/2017   Procedure: BIOPSY;  Surgeon: Danie Binder, MD;  [facility];  Service: Endoscopy;;  gastric biopsy for h pylori  . CARPAL TUNNEL RELEASE Right 12/07/2018  . CATARACT EXTRACTION, BILATERAL     DID 1 YEAR APART , LAST ONE WAS 12-2017  . COLONOSCOPY WITH ESOPHAGOGASTRODUODENOSCOPY (EGD)  2014   [place] PROPOFOL N/A 08/09/2017   3 simple adenomas, diverticulosis in recto-sigmoid, sigmoid, and descending colin, moderate external and internal hemorrhoids  . ESOPHAGOGASTRODUODENOSCOPY (EGD) WITH PROPOFOL N/A 08/09/2017   Low-grade narrowing Schatzki ring due to GERD; small hiatal hernia; mild gastritis and duodenitis due to NSAIDs; biopsy with gastritis  . KNEE ARTHROSCOPY Right 11/2017    WITH DR Wynelle Link  AT [place] Right   . POLYPECTOMY  08/09/2017   Procedure: POLYPECTOMY;  Surgeon: Danie Binder, MD;  [facility];  Service: Endoscopy;;  cecal polyp hs, transverse colon polyp hs, rectal polyp cs  . TOTAL KNEE REVISION Right 03/01/2018   Procedure: RIGHT TOTAL KNEE REVISION;  Surgeon: Gaynelle Arabian, MD;  Location: [facility];  Service: Orthopedics;  Laterality: Right;  168min    There were no vitals filed for this visit.  Subjective Assessment - 06/08/19 1319    Subjective  R hip is no longer hurting. Thinks it might be caused by pushing the pedal when she is quilting. Having a good week. No falls.    Pertinent History  GBS (diagnosed 9/20) history of OSA, GERD, recent CTR right hand    How long can you walk comfortably?  10-15 minutes or 260'    Diagnostic tests  MRI - had imaging done on brain last week, waiting for results -Had  a nerve conduction test last week: 50% on L, 25% on R    Patient Stated Goals  wants to strengthen R leg, wants to get well enough to go dinner and dancing.    Currently in Pain?  No/denies                       [facility]  Adult PT Treatment/Exercise - 06/08/19 0001      Ambulation/Gait   Ambulation/Gait  Yes    Ambulation/Gait Assistance  5: Supervision;4: Min guard    Ambulation/Gait Assistance Details  pt brought in her own 3 prong cane from home today, therapist adjusted the height as pt had height of cane too low. performed gait within session with pt having no LOB of knee stability with cane     Ambulation Distance (Feet)  100 Feet   2 x 50'   Assistive device  Straight cane   with 3 prong tip   Gait Pattern  Step-through pattern;Decreased hip/knee flexion - right;Decreased hip/knee flexion - left    Ambulation Surface  Level;Indoor      Neuro Re-ed    Neuro Re-ed Details   Standing on blue mat in // bars: with 2 floor bubbles anteriorly, alternating SLS taps 1 x 10 reps B, progressing to  1 x 6 reps B forward and cross taps with pt needing intermittent UE support, pt turning more body when cross tapping. Forward marching and backwards walking with alternating UE lift 3 reps - min guard for balance. On rockerboard in M/L direction: ball toss with PT tech approx. 20 reps, min guard for balance.       Exercises   Exercises  Other Exercises    Other Exercises   Supine on mat table: with knees flexed, bridging over red ball for extensor and core strengthening, bridging with 1 x 10 reps hip ABD/bent knee fall outs at the top of range with green band, 1 x 5 reps bridging with 3 reps of bent knee fall outs at top of range of motion before lowering.       Knee/Hip Exercises: Standing   Lateral Step Up  Both;Hand Hold: 1;Hand Hold: 0;1 set;10 reps;Step Height: 4"    Lateral Step Up Limitations  single UE support when performing on RLE, no UE support on L    Other Standing Knee Exercises  Attempted step ups on RLE on 6" step with BUE support, however pt heavily reliant on BUE to perform vs. R leg. Instead perform 2 x 10 reps of RLE step ups on 4" step with fingertip/single UE support and 1 x 10 reps on LLE with no UE support.                PT Short Term Goals - 05/29/19 1324      PT SHORT TERM GOAL #1   Title  Patient will ambulate at least 300' outdoors over unlevel surfaces with LRAD as well as perform a curb with supervision in order to improve community mobility. ALL STGS DUE 05/28/19.    Baseline  deferred today due to weather.    Time  4    Period  Weeks    Status  Deferred    Target Date  05/28/19      PT SHORT TERM GOAL #2   Title  Patient will increase gait speed to at least 2.2 ft/sec with RW in order to decrease risk of falls.    Baseline  12.97 seconds = 2.53 ft/sec    Time  4    Period  Weeks    Status  Achieved      PT SHORT TERM GOAL #3   Title  Patient will decrease 5 times sit <> stand score to 17 seconds or less with BUE support in order to improve  functional LE strength.    Baseline  15.56 seconds  on 05/29/19 with BUE support    Time  4    Period  Weeks    Status  Achieved        PT Long Term Goals - 05/29/19 1435      PT LONG TERM GOAL #1   Title  Patient will be independent with final HEP in order to build upon functional gains in therapy. ALL LTGS DUE 06/25/19    Baseline  pt will benefit from on-going additions to HEP    Time  8    Period  Weeks    Status  On-going      PT LONG TERM GOAL #2   Title  Patient will increase baseline BERG score to at least 48/56 in order to demo decr fall risk.    Baseline  46/56 on 04/30/19    Time  8    Period  Weeks    Status  New      PT LONG TERM GOAL #3   Title  Patient will ambulate at least 115' indoors with LRAD with supervision in order to improve household mobility.    Time  8    Period  Weeks    Status  New      PT LONG TERM GOAL #4   Title  Patient will perform TUG in 17 seconds or less with LRAD in order to demo decreased fall risk.    Baseline  18.4 seconds with RW    Time  8    Period  Weeks    Status  Revised      PT LONG TERM GOAL #5   Title  Pt will perform at least 11 sit <> stands in 30 seconds with BUE support from standard arm chair in order to demo improved endurance and functional LE strength.    Baseline  9 on 05/29/19    Time  8    Period  Weeks    Status  Revised      PT LONG TERM GOAL #6   Title  Patient will improve gait speed to at least 2.2 ft/sec using SPC in order to demo decreased fall risk and improved household mobility.    Time  8    Period  Weeks    Status  Revised            Plan - 06/08/19 1608    Clinical Impression Statement  Today's skilled session focused on BLE strengthening, balance strategies, and gait training with SPC. Attempted 6" step ups on RLE with BUE, however pt heavily reliant on BUE for step up vs. using RLE, pt now able to perform RLE step ups to 4" step with fingertip and single UE support. Pt able to progress  SLS activites on a compliant surface with no UE support. With incr reps with ball toss on rockerboard, pt able to utilize hip/ankle balance strategies to maintain balance vs. grabbing onto bars, Brief intermittent seated rest breaks throughout due to fatigue. Will continue to progress towards LTGs.    Personal Factors and Comorbidities  Comorbidity 2;Past/Current Experience    Comorbidities  OSA, GERD    Examination-Activity Limitations  Locomotion Level;Transfers;Stairs;Stand    Examination-Participation Restrictions  Community Activity    Stability/Clinical Decision Making  Evolving/Moderate complexity    Rehab Potential  Good    PT Frequency  2x / week    PT Duration  8 weeks    PT Treatment/Interventions  ADLs/Self Care Home Management;Electrical Stimulation;Aquatic Therapy;Gait training;DME Instruction;Stair training;Functional mobility training;Therapeutic  activities;Therapeutic exercise;Patient/family education;Neuromuscular re-education;Balance training;Passive range of motion    PT Next Visit Plan  gait training with SPC with quad tip. hip ABD strengthening. Step ups/step taps on 4"/6" step. NuStep/SciFit for LE strengthening and endurance. continue sit <> stand training, standing balance activites/dynamic gait in // bars with decr UE support.  monitor fatigue.    PT Home Exercise Plan  [id number] plus seated hip ADD pillow squeezes    Consulted and Agree with Plan of Care  Patient       Patient will benefit from skilled therapeutic intervention in order to improve the following deficits and impairments:  Abnormal gait, Decreased activity tolerance, Decreased coordination, Decreased balance, Decreased endurance, Decreased strength, Difficulty walking, Decreased mobility  Visit Diagnosis: Other abnormalities of gait and mobility  Muscle weakness (generalized)  Difficulty in walking, not elsewhere classified  Other symptoms and signs involving the nervous system     Problem  List Patient Active Problem List   Diagnosis Date Noted  . Overactive bladder 03/28/2019  . Guillain Barr syndrome ([place]) 01/19/2019  . Cervical myofascial pain syndrome 12/16/2018  . Carpal tunnel syndrome 12/16/2018  . 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 Leg weakness, bilateral 12/08/2018  . IBS (irritable bowel syndrome) 12/08/2018  . GERD (gastroesophageal reflux disease) 09/14/2018  . 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 Abdominal pain, epigastric 06/03/2017  . Constipation 01/27/2016  . History of adenomatous polyp of colon 01/27/2016    Arliss Journey, PT, DPT 06/08/2019, 4:16 PM  [facility] [address] [place], [place], [address] Phone: [phone]   Fax:  [phone]  Name: Paula Sutton MRN: [id number] Date of Birth: 06-29-1955

## 2019-06-11 ENCOUNTER — Other Ambulatory Visit: Payer: Self-pay

## 2019-06-11 ENCOUNTER — Ambulatory Visit: Payer: Medicare Other | Admitting: Physical Therapy

## 2019-06-11 DIAGNOSIS — R29818 Other symptoms and signs involving the nervous system: Secondary | ICD-10-CM | POA: Diagnosis not present

## 2019-06-11 DIAGNOSIS — R262 Difficulty in walking, not elsewhere classified: Secondary | ICD-10-CM

## 2019-06-11 DIAGNOSIS — R2689 Other abnormalities of gait and mobility: Secondary | ICD-10-CM | POA: Diagnosis not present

## 2019-06-11 DIAGNOSIS — M6281 Muscle weakness (generalized): Secondary | ICD-10-CM | POA: Diagnosis not present

## 2019-06-11 NOTE — Therapy (Signed)
[facility] [address] [place], [place], [address] Phone: [phone]   Fax:  [phone]  Physical Therapy Treatment  Patient Details  Name: Paula Sutton MRN: [id number] Date of Birth: [birth year] Referring Provider (PT): Courtney Heys, MD   Encounter Date: 06/11/2019  PT End of Session - 06/11/19 1540    Visit Number  36    Number of Visits  42    Date for PT Re-Evaluation  07/29/19    Authorization Type  [employer], Medicare Secondary    PT Start Time  1405    PT Stop Time  1445    PT Time Calculation (min)  40 min    Equipment Utilized During Treatment  Gait belt    Activity Tolerance  Patient tolerated treatment well    Behavior During Therapy  [facility] for tasks assessed/performed       Past Medical History:  Diagnosis Date  . Arthritis    knee  . Dyslipidemia   . GERD (gastroesophageal reflux disease)   . Left anterior fascicular block    PATIENT SAYS THIS SHOWED ON EKG DURING COLONOSCOPY , WAS REFERRED TO CARDIOLOGY DR. Minus Breeding  (SEE EPIC ENCOUNTER 09-2017)       Past Surgical History:  Procedure Laterality Date  . ABDOMINAL HYSTERECTOMY    . BIOPSY  08/09/2017   Procedure: BIOPSY;  Surgeon: Danie Binder, MD;  [facility];  Service: Endoscopy;;  gastric biopsy for h pylori  . CARPAL TUNNEL RELEASE Right 12/07/2018  . CATARACT EXTRACTION, BILATERAL     DID 1 YEAR APART , LAST ONE WAS 12-2017  . COLONOSCOPY WITH ESOPHAGOGASTRODUODENOSCOPY (EGD)  2014   [place] PROPOFOL N/A 08/09/2017   3 simple adenomas, diverticulosis in recto-sigmoid, sigmoid, and descending colin, moderate external and internal hemorrhoids  . ESOPHAGOGASTRODUODENOSCOPY (EGD) WITH PROPOFOL N/A 08/09/2017   Low-grade narrowing Schatzki ring due to GERD; small hiatal hernia; mild gastritis and duodenitis due to NSAIDs; biopsy with gastritis  . KNEE ARTHROSCOPY Right 11/2017   WITH DR Wynelle Link  AT [place] Right   . POLYPECTOMY  08/09/2017   Procedure: POLYPECTOMY;  Surgeon: Danie Binder, MD;  [facility];  Service: Endoscopy;;  cecal polyp hs, transverse colon polyp hs, rectal polyp cs  . TOTAL KNEE REVISION Right 03/01/2018   Procedure: RIGHT TOTAL KNEE REVISION;  Surgeon: Gaynelle Arabian, MD;  Location: [facility];  Service: Orthopedics;  Laterality: Right;  156min    There were no vitals filed for this visit.  Subjective Assessment - 06/11/19 1408    Subjective  Had to work out his R hip again this morning - but still way better. No falls.    Pertinent History  GBS (diagnosed 9/20) history of OSA, GERD, recent CTR right hand    How long can you walk comfortably?  10-15 minutes or 260'    Diagnostic tests  MRI - had imaging done on brain last week, waiting for results -Had  a nerve conduction test last week: 50% on L, 25% on R    Patient Stated Goals  wants to strengthen R leg, wants to get well enough to go dinner and dancing.    Currently in Pain?  No/denies                       [facility] Adult PT Treatment/Exercise - 06/11/19 2059  Exercises   Exercises  Other Exercises    Other Exercises   Pt stating that she has been trying standing strengthening exercises at home with 1.5 lb ankle weights at home and is wondering if this is why her back/hip is more sore. With 1 lb ankleweight: B hip ABD 2 x 7 reps B, hip extension 1 x 8 reps B. Needed verbal, demonstrative, and tactile cues for proper technique for exercises as pt with tendency to compensate with low back. Pt able to demo proper technique. Discussed with pt performing at home with no ankle weights and less reps - pt verbalized understanding. In / /bars with BUE support: Side steps with green theraband down and back 2 reps - cues for mini squat, backwards stepping with green band down and back 2 reps - cues for wider step width.      Knee/Hip Exercises:  Aerobic   Nustep  Level 5 with BLE and BUE for 6 minutes with goal >/-55 steps per minute for strengthening, ROM and activity tolerance          Balance Exercises - 06/11/19 2106      Balance Exercises: Standing   Other Standing Exercises  In corner on rockerboard with chair anteriorly: 1 x 12 reps hip strategy progressing from BUE support to no UE support, holding board with eyes closed, multiple reps for approx.. 15 seconds         PT Education - 06/11/19 1539    Education Details  reviewed HEP technique for standing hip extension and hip ABD.    Person(s) Educated  Patient    Methods  Explanation;Demonstration    Comprehension  Verbalized understanding;Returned demonstration       PT Short Term Goals - 05/29/19 1324      PT SHORT TERM GOAL #1   Title  Patient will ambulate at least 300' outdoors over unlevel surfaces with LRAD as well as perform a curb with supervision in order to improve community mobility. ALL STGS DUE 05/28/19.    Baseline  deferred today due to weather.    Time  4    Period  Weeks    Status  Deferred    Target Date  05/28/19      PT SHORT TERM GOAL #2   Title  Patient will increase gait speed to at least 2.2 ft/sec with RW in order to decrease risk of falls.    Baseline  12.97 seconds = 2.53 ft/sec    Time  4    Period  Weeks    Status  Achieved      PT SHORT TERM GOAL #3   Title  Patient will decrease 5 times sit <> stand score to 17 seconds or less with BUE support in order to improve functional LE strength.    Baseline  15.56 seconds on 05/29/19 with BUE support    Time  4    Period  Weeks    Status  Achieved        PT Long Term Goals - 05/29/19 1435      PT LONG TERM GOAL #1   Title  Patient will be independent with final HEP in order to build upon functional gains in therapy. ALL LTGS DUE 06/25/19    Baseline  pt will benefit from on-going additions to HEP    Time  8    Period  Weeks    Status  On-going      PT LONG TERM GOAL #2  Title  Patient will increase baseline BERG score to at least 48/56 in order to demo decr fall risk.    Baseline  46/56 on 04/30/19    Time  8    Period  Weeks    Status  New      PT LONG TERM GOAL #3   Title  Patient will ambulate at least 115' indoors with LRAD with supervision in order to improve household mobility.    Time  8    Period  Weeks    Status  New      PT LONG TERM GOAL #4   Title  Patient will perform TUG in 17 seconds or less with LRAD in order to demo decreased fall risk.    Baseline  18.4 seconds with RW    Time  8    Period  Weeks    Status  Revised      PT LONG TERM GOAL #5   Title  Pt will perform at least 11 sit <> stands in 30 seconds with BUE support from standard arm chair in order to demo improved endurance and functional LE strength.    Baseline  9 on 05/29/19    Time  8    Period  Weeks    Status  Revised      PT LONG TERM GOAL #6   Title  Patient will improve gait speed to at least 2.2 ft/sec using SPC in order to demo decreased fall risk and improved household mobility.    Time  8    Period  Weeks    Status  Revised            Plan - 06/11/19 2107    Clinical Impression Statement  Reviewed technique for standing strengthening exercises for hip ABD and hip extension, as pt needed verbal, tactile and demonstrative cues to perform correctly to avoid compensatory movement. Pt able to demo proper technique. With increased reps, pt able to progress hip strategy balance on rockerboard without need for UE support. Will continue to progress towards LTGs.    Personal Factors and Comorbidities  Comorbidity 2;Past/Current Experience    Comorbidities  OSA, GERD    Examination-Activity Limitations  Locomotion Level;Transfers;Stairs;Stand    Examination-Participation Restrictions  Community Activity    Stability/Clinical Decision Making  Evolving/Moderate complexity    Rehab Potential  Good    PT Frequency  2x / week    PT Duration  8 weeks    PT  Treatment/Interventions  ADLs/Self Care Home Management;Electrical Stimulation;Aquatic Therapy;Gait training;DME Instruction;Stair training;Functional mobility training;Therapeutic activities;Therapeutic exercise;Patient/family education;Neuromuscular re-education;Balance training;Passive range of motion    PT Next Visit Plan  gait training with SPC with quad tip. hip ABD strengthening. Step ups/step taps on 4"/6" step. NuStep/SciFit for LE strengthening and endurance. continue sit <> stand training, standing balance activites/dynamic gait in // bars with decr UE support.  monitor fatigue.    PT Home Exercise Plan  [id number] plus seated hip ADD pillow squeezes    Consulted and Agree with Plan of Care  Patient       Patient will benefit from skilled therapeutic intervention in order to improve the following deficits and impairments:  Abnormal gait, Decreased activity tolerance, Decreased coordination, Decreased balance, Decreased endurance, Decreased strength, Difficulty walking, Decreased mobility  Visit Diagnosis: Other symptoms and signs involving the nervous system  Difficulty in walking, not elsewhere classified  Muscle weakness (generalized)  Other abnormalities of gait and mobility     Problem List Patient  Active Problem List   Diagnosis Date Noted  . Overactive bladder 03/28/2019  . Guillain Barr syndrome ([place]) 01/19/2019  . Cervical myofascial pain syndrome 12/16/2018  . Carpal tunnel syndrome 12/16/2018  . Axonal GBS (Guillain-Barre syndrome) ([place]) 12/15/2018  . Leg weakness, bilateral 12/08/2018  . IBS (irritable bowel syndrome) 12/08/2018  . GERD (gastroesophageal reflux disease) 09/14/2018  . 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 Abdominal pain, epigastric 06/03/2017  . Constipation 01/27/2016  . History of adenomatous polyp of colon 01/27/2016    Arliss Journey, PT, DPT  06/11/2019, 9:09 PM  [facility] [address] [place], [place], [address] Phone: [phone]   Fax:  [phone]  Name: Paula Sutton MRN: [id number] Date of Birth: November 21, 1955

## 2019-06-13 ENCOUNTER — Other Ambulatory Visit: Payer: Self-pay

## 2019-06-13 ENCOUNTER — Ambulatory Visit: Payer: Medicare Other | Admitting: Physical Therapy

## 2019-06-13 ENCOUNTER — Encounter: Payer: Self-pay | Admitting: Physical Therapy

## 2019-06-13 DIAGNOSIS — R262 Difficulty in walking, not elsewhere classified: Secondary | ICD-10-CM

## 2019-06-13 DIAGNOSIS — R2689 Other abnormalities of gait and mobility: Secondary | ICD-10-CM | POA: Diagnosis not present

## 2019-06-13 DIAGNOSIS — M6281 Muscle weakness (generalized): Secondary | ICD-10-CM | POA: Diagnosis not present

## 2019-06-13 DIAGNOSIS — R29818 Other symptoms and signs involving the nervous system: Secondary | ICD-10-CM | POA: Diagnosis not present

## 2019-06-13 NOTE — Therapy (Signed)
[facility] [address] [place], [place], [address] Phone: [phone]   Fax:  [phone]  Physical Therapy Treatment  Patient Details  Name: Paula Sutton MRN: [id number] Date of Birth: 1956/03/05 Referring Provider (PT): Courtney Heys, MD   Encounter Date: 06/13/2019  PT End of Session - 06/13/19 1809    Visit Number  37    Number of Visits  42    Date for PT Re-Evaluation  07/29/19    Authorization Type  [employer], Medicare Secondary    PT Start Time  1335    PT Stop Time  1415    PT Time Calculation (min)  40 min    Equipment Utilized During Treatment  Other (comment)   pool noodle, floatation bar bells   Activity Tolerance  Patient tolerated treatment well    Behavior During Therapy  [facility] for tasks assessed/performed       Past Medical History:  Diagnosis Date  . Arthritis    knee  . Dyslipidemia   . GERD (gastroesophageal reflux disease)   . Left anterior fascicular block    PATIENT SAYS THIS SHOWED ON EKG DURING COLONOSCOPY , WAS REFERRED TO CARDIOLOGY DR. Minus Breeding  (SEE EPIC ENCOUNTER 09-2017)       Past Surgical History:  Procedure Laterality Date  . ABDOMINAL HYSTERECTOMY    . BIOPSY  08/09/2017   Procedure: BIOPSY;  Surgeon: Danie Binder, MD;  [facility];  Service: Endoscopy;;  gastric biopsy for h pylori  . CARPAL TUNNEL RELEASE Right 12/07/2018  . CATARACT EXTRACTION, BILATERAL     DID 1 YEAR APART , LAST ONE WAS 12-2017  . COLONOSCOPY WITH ESOPHAGOGASTRODUODENOSCOPY (EGD)  2014   [place] PROPOFOL N/A 08/09/2017   3 simple adenomas, diverticulosis in recto-sigmoid, sigmoid, and descending colin, moderate external and internal hemorrhoids  . ESOPHAGOGASTRODUODENOSCOPY (EGD) WITH PROPOFOL N/A 08/09/2017   Low-grade narrowing Schatzki ring due to GERD; small hiatal hernia; mild gastritis and duodenitis due to NSAIDs; biopsy with  gastritis  . KNEE ARTHROSCOPY Right 11/2017   WITH DR Wynelle Link  AT [place] Right   . POLYPECTOMY  08/09/2017   Procedure: POLYPECTOMY;  Surgeon: Danie Binder, MD;  [facility];  Service: Endoscopy;;  cecal polyp hs, transverse colon polyp hs, rectal polyp cs  . TOTAL KNEE REVISION Right 03/01/2018   Procedure: RIGHT TOTAL KNEE REVISION;  Surgeon: Gaynelle Arabian, MD;  Location: [facility];  Service: Orthopedics;  Laterality: Right;  166min    There were no vitals filed for this visit.  Subjective Assessment - 06/13/19 1807    Subjective  Pt denies any falls or changes.  Hip is feeling better.    Patient is accompained by:  Family member    Pertinent History  GBS (diagnosed 9/20) history of OSA, GERD, recent CTR right hand    How long can you walk comfortably?  10-15 minutes or 260'    Diagnostic tests  MRI - had imaging done on brain last week, waiting for results -Had  a nerve conduction test last week: 50% on L, 25% on R    Patient Stated Goals  wants to strengthen R leg, wants to get well enough to go dinner and dancing.    Currently in Pain?  No/denies       Aquatic therapy at [facility] - pool temp 86.7degrees  Patient seen for aquatic therapy today.  Treatment took place in water 3.5-4 feet deep depending upon activity. Pt entered and exited the pool via step negotiation step by step sequence with use of bil. hand rails with supervision: Pt with improved control/strength on steps descending today.  Did not have to use hand to lift LLE onto step today but does circumduct to clear step.  Runners stretch bil LE's and toes/feet up edge of pool x 30 seconds each bil LE's.  Pt performed gait training in pool forwards 40m x 2 repswithout UE supportthen 75m x 2 repsbackwardswith pool noodle for UE support. Performed side stepping squatholdingbar bellsx 30m x 2.  Cues for technique/sequence.  Standing with UE support of pool edge for bil LE  marching, hip extension, hip abd, hamstring curl. Pt able to perform 15 reps each side.   Squats while standing at pool edge for UE support x 10 reps then 10 reps without UE assist.  Performed single leg squats x 10 reps each side with UE support.  Ai Chi posture of soothing and enclosing x 15 reps each.  Pt able to maintain balance without UE support.  Seated on pool bench for LAQ x 15 bil sides and sit<>stand x 10 without UE assist.  Pt requires buoyancy for support with balance and viscosity for resistance for strengthening exercises; buoyancy is also needed for off loading body to assist with exercises.                    PT Short Term Goals - 05/29/19 1324      PT SHORT TERM GOAL #1   Title  Patient will ambulate at least 300' outdoors over unlevel surfaces with LRAD as well as perform a curb with supervision in order to improve community mobility. ALL STGS DUE 05/28/19.    Baseline  deferred today due to weather.    Time  4    Period  Weeks    Status  Deferred    Target Date  05/28/19      PT SHORT TERM GOAL #2   Title  Patient will increase gait speed to at least 2.2 ft/sec with RW in order to decrease risk of falls.    Baseline  12.97 seconds = 2.53 ft/sec    Time  4    Period  Weeks    Status  Achieved      PT SHORT TERM GOAL #3   Title  Patient will decrease 5 times sit <> stand score to 17 seconds or less with BUE support in order to improve functional LE strength.    Baseline  15.56 seconds on 05/29/19 with BUE support    Time  4    Period  Weeks    Status  Achieved        PT Long Term Goals - 05/29/19 1435      PT LONG TERM GOAL #1   Title  Patient will be independent with final HEP in order to build upon functional gains in therapy. ALL LTGS DUE 06/25/19    Baseline  pt will benefit from on-going additions to HEP    Time  8    Period  Weeks    Status  On-going      PT LONG TERM GOAL #2   Title  Patient will increase baseline BERG score  to at least 48/56 in order to demo decr fall risk.    Baseline  46/56 on 04/30/19    Time  8    Period  Weeks    Status  New      PT LONG TERM GOAL #3   Title  Patient will ambulate at least 115' indoors with LRAD with supervision in order to improve household mobility.    Time  8    Period  Weeks    Status  New      PT LONG TERM GOAL #4   Title  Patient will perform TUG in 17 seconds or less with LRAD in order to demo decreased fall risk.    Baseline  18.4 seconds with RW    Time  8    Period  Weeks    Status  Revised      PT LONG TERM GOAL #5   Title  Pt will perform at least 11 sit <> stands in 30 seconds with BUE support from standard arm chair in order to demo improved endurance and functional LE strength.    Baseline  9 on 05/29/19    Time  8    Period  Weeks    Status  Revised      PT LONG TERM GOAL #6   Title  Patient will improve gait speed to at least 2.2 ft/sec using SPC in order to demo decreased fall risk and improved household mobility.    Time  8    Period  Weeks    Status  Revised            Plan - 06/13/19 1810    Clinical Impression Statement  Skilled aquatic session continues to focus on strengthening, balance and gait.  Pt with improved knee control/strength during session today leading to increased stability with activities.  Continue PT per POC,    Personal Factors and Comorbidities  Comorbidity 2;Past/Current Experience    Comorbidities  OSA, GERD    Examination-Activity Limitations  Locomotion Level;Transfers;Stairs;Stand    Examination-Participation Restrictions  Community Activity    Stability/Clinical Decision Making  Evolving/Moderate complexity    Rehab Potential  Good    PT Frequency  2x / week    PT Duration  8 weeks    PT Treatment/Interventions  ADLs/Self Care Home Management;Electrical Stimulation;Aquatic Therapy;Gait training;DME Instruction;Stair training;Functional mobility training;Therapeutic activities;Therapeutic  exercise;Patient/family education;Neuromuscular re-education;Balance training;Passive range of motion    PT Next Visit Plan  gait training with SPC with quad tip. hip ABD strengthening. Step ups/step taps on 4"/6" step. NuStep/SciFit for LE strengthening and endurance. continue sit <> stand training, standing balance activites/dynamic gait in // bars with decr UE support.  monitor fatigue.    PT Home Exercise Plan  [id number] plus seated hip ADD pillow squeezes    Consulted and Agree with Plan of Care  Patient       Patient will benefit from skilled therapeutic intervention in order to improve the following deficits and impairments:  Abnormal gait, Decreased activity tolerance, Decreased coordination, Decreased balance, Decreased endurance, Decreased strength, Difficulty walking, Decreased mobility  Visit Diagnosis: Difficulty in walking, not elsewhere classified  Muscle weakness (generalized)  Other abnormalities of gait and mobility     Problem List Patient Active Problem List   Diagnosis Date Noted  . Overactive bladder 03/28/2019  . Guillain Barr syndrome ([place]) 01/19/2019  . Cervical myofascial pain syndrome 12/16/2018  . Carpal tunnel syndrome 12/16/2018  . 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 Leg weakness, bilateral 12/08/2018  . IBS (irritable bowel syndrome) 12/08/2018  . GERD (gastroesophageal reflux disease) 09/14/2018  . 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 Abdominal  pain, epigastric 06/03/2017  . Constipation 01/27/2016  . History of adenomatous polyp of colon 01/27/2016    Narda Bonds, PTA [place] 06/13/19 6:17 PM Phone: [phone] Fax: [facility] [address] [place], [place], [address] Phone: [phone]   Fax:  [phone]  Name: Paula Sutton MRN: [id number] Date of Birth: [birth year]

## 2019-06-19 ENCOUNTER — Ambulatory Visit: Payer: Medicare Other | Admitting: Physical Therapy

## 2019-06-20 ENCOUNTER — Ambulatory Visit: Payer: Medicare Other | Attending: Physical Medicine and Rehabilitation | Admitting: Physical Therapy

## 2019-06-20 ENCOUNTER — Other Ambulatory Visit: Payer: Self-pay

## 2019-06-20 DIAGNOSIS — M6281 Muscle weakness (generalized): Secondary | ICD-10-CM | POA: Diagnosis not present

## 2019-06-20 DIAGNOSIS — R262 Difficulty in walking, not elsewhere classified: Secondary | ICD-10-CM | POA: Insufficient documentation

## 2019-06-20 DIAGNOSIS — R29818 Other symptoms and signs involving the nervous system: Secondary | ICD-10-CM | POA: Diagnosis not present

## 2019-06-20 DIAGNOSIS — R2689 Other abnormalities of gait and mobility: Secondary | ICD-10-CM | POA: Insufficient documentation

## 2019-06-21 ENCOUNTER — Ambulatory Visit: Payer: Medicare Other | Admitting: Physical Therapy

## 2019-06-21 ENCOUNTER — Encounter: Payer: Self-pay | Admitting: Physical Therapy

## 2019-06-21 NOTE — Therapy (Signed)
[facility] [address] [place], [place], [address] Phone: [phone]   Fax:  [phone]  Physical Therapy Treatment  Patient Details  Name: Paula Sutton MRN: [id number] Date of Birth: Feb 20, 1956 Referring Provider (PT): Courtney Heys, MD   Encounter Date: 06/20/2019  PT End of Session - 06/21/19 1117    Visit Number  38    Number of Visits  42    Date for PT Re-Evaluation  07/29/19    Authorization Type  [employer], Medicare Secondary    PT Start Time  1330    PT Stop Time  1415    PT Time Calculation (min)  45 min    Equipment Utilized During Treatment  Other (comment)   pool noodle, floatation bar bells   Activity Tolerance  Patient tolerated treatment well    Behavior During Therapy  [facility] for tasks assessed/performed       Past Medical History:  Diagnosis Date  . Arthritis    knee  . Dyslipidemia   . GERD (gastroesophageal reflux disease)   . Left anterior fascicular block    PATIENT SAYS THIS SHOWED ON EKG DURING COLONOSCOPY , WAS REFERRED TO CARDIOLOGY DR. Minus Breeding  (SEE EPIC ENCOUNTER 09-2017)       Past Surgical History:  Procedure Laterality Date  . ABDOMINAL HYSTERECTOMY    . BIOPSY  08/09/2017   Procedure: BIOPSY;  Surgeon: Danie Binder, MD;  [facility];  Service: Endoscopy;;  gastric biopsy for h pylori  . CARPAL TUNNEL RELEASE Right 12/07/2018  . CATARACT EXTRACTION, BILATERAL     DID 1 YEAR APART , LAST ONE WAS 12-2017  . COLONOSCOPY WITH ESOPHAGOGASTRODUODENOSCOPY (EGD)  2014   [place] PROPOFOL N/A 08/09/2017   3 simple adenomas, diverticulosis in recto-sigmoid, sigmoid, and descending colin, moderate external and internal hemorrhoids  . ESOPHAGOGASTRODUODENOSCOPY (EGD) WITH PROPOFOL N/A 08/09/2017   Low-grade narrowing Schatzki ring due to GERD; small hiatal hernia; mild gastritis and duodenitis due to NSAIDs; biopsy with  gastritis  . KNEE ARTHROSCOPY Right 11/2017   WITH DR Wynelle Link  AT [place] Right   . POLYPECTOMY  08/09/2017   Procedure: POLYPECTOMY;  Surgeon: Danie Binder, MD;  [facility];  Service: Endoscopy;;  cecal polyp hs, transverse colon polyp hs, rectal polyp cs  . TOTAL KNEE REVISION Right 03/01/2018   Procedure: RIGHT TOTAL KNEE REVISION;  Surgeon: Gaynelle Arabian, MD;  Location: [facility];  Service: Orthopedics;  Laterality: Right;  15min    There were no vitals filed for this visit.  Subjective Assessment - 06/21/19 1116    Subjective  Pt states she had a fall while walking in house.  Thinks she wasnt picking her feet up enough in new shoes.  Denies injury other than sore.    Patient is accompained by:  Family member    Pertinent History  GBS (diagnosed 9/20) history of OSA, GERD, recent CTR right hand    How long can you walk comfortably?  10-15 minutes or 260'    Diagnostic tests  MRI - had imaging done on brain last week, waiting for results -Had  a nerve conduction test last week: 50% on L, 25% on R    Patient Stated Goals  wants to strengthen R leg, wants to get well enough to go dinner and dancing.    Currently in Pain?  No/denies  Aquatic therapy at [facility] - pool temp 86.7degrees  Patient seen for aquatic therapy today. Treatment took place in water 3.5-4 feet deep depending upon activity. Pt entered and exited the pool via step negotiation step by step sequence with use of bil. hand rails with supervision: Pt with improved control/strength on steps descending today. Did not have to use hand to lift LLE onto step today but does circumduct to clear step.  Runners stretch bil LE's and toes/feet up edge of pool x 30 seconds each bil LE's.  Pt performed gait training in pool forwards 22m x 2 repswithout UE supportthen 49m x 2 repsbackwards. Performed side stepping 74m x 2 then side stepping squatsx 16m x 2.  Cues for  technique/sequence.  Standing with 1-0 UE support of pool edge for bil LE marching, hip extension, hip abd, hamstring curl. Pt able to perform 15 reps each side.   Squats while standing at pool edge for UE support x 10 repsthen 10 reps without UE assist. Performed single leg squats x 10 reps each sidewith UE support.  Ai Chi posture of soothing and enclosing x 15 reps each.  Pt able to maintain balance without UE support.  Gait at ramp railing in pool working on heel to strike and step thru gait pattern.  Pt initially tending to perform step to pattern but able to perform step thru with practice.  Forward<>backward step weight shifting without UE support.  Pt requires buoyancy for support with balance and viscosity for resistance for strengthening exercises; buoyancy is also needed for off loading body to assist with exercises.       PT Short Term Goals - 05/29/19 1324      PT SHORT TERM GOAL #1   Title  Patient will ambulate at least 300' outdoors over unlevel surfaces with LRAD as well as perform a curb with supervision in order to improve community mobility. ALL STGS DUE 05/28/19.    Baseline  deferred today due to weather.    Time  4    Period  Weeks    Status  Deferred    Target Date  05/28/19      PT SHORT TERM GOAL #2   Title  Patient will increase gait speed to at least 2.2 ft/sec with RW in order to decrease risk of falls.    Baseline  12.97 seconds = 2.53 ft/sec    Time  4    Period  Weeks    Status  Achieved      PT SHORT TERM GOAL #3   Title  Patient will decrease 5 times sit <> stand score to 17 seconds or less with BUE support in order to improve functional LE strength.    Baseline  15.56 seconds on 05/29/19 with BUE support    Time  4    Period  Weeks    Status  Achieved        PT Long Term Goals - 05/29/19 1435      PT LONG TERM GOAL #1   Title  Patient will be independent with final HEP in order to build upon functional gains in therapy. ALL  LTGS DUE 06/25/19    Baseline  pt will benefit from on-going additions to HEP    Time  8    Period  Weeks    Status  On-going      PT LONG TERM GOAL #2   Title  Patient will increase baseline BERG score to at least 48/56 in order  to demo decr fall risk.    Baseline  46/56 on 04/30/19    Time  8    Period  Weeks    Status  New      PT LONG TERM GOAL #3   Title  Patient will ambulate at least 115' indoors with LRAD with supervision in order to improve household mobility.    Time  8    Period  Weeks    Status  New      PT LONG TERM GOAL #4   Title  Patient will perform TUG in 17 seconds or less with LRAD in order to demo decreased fall risk.    Baseline  18.4 seconds with RW    Time  8    Period  Weeks    Status  Revised      PT LONG TERM GOAL #5   Title  Pt will perform at least 11 sit <> stands in 30 seconds with BUE support from standard arm chair in order to demo improved endurance and functional LE strength.    Baseline  9 on 05/29/19    Time  8    Period  Weeks    Status  Revised      PT LONG TERM GOAL #6   Title  Patient will improve gait speed to at least 2.2 ft/sec using SPC in order to demo decreased fall risk and improved household mobility.    Time  8    Period  Weeks    Status  Revised            Plan - 06/21/19 1118    Clinical Impression Statement  Pt's strength continues to improve along with balance in session.  Pt with improved gait pattern after session today.  continue PT per POC.    Personal Factors and Comorbidities  Comorbidity 2;Past/Current Experience    Comorbidities  OSA, GERD    Examination-Activity Limitations  Locomotion Level;Transfers;Stairs;Stand    Examination-Participation Restrictions  Community Activity    Stability/Clinical Decision Making  Evolving/Moderate complexity    Rehab Potential  Good    PT Frequency  2x / week    PT Duration  8 weeks    PT Treatment/Interventions  ADLs/Self Care Home Management;Electrical  Stimulation;Aquatic Therapy;Gait training;DME Instruction;Stair training;Functional mobility training;Therapeutic activities;Therapeutic exercise;Patient/family education;Neuromuscular re-education;Balance training;Passive range of motion    PT Next Visit Plan  gait training with SPC with quad tip. hip ABD strengthening. Step ups/step taps on 4"/6" step. NuStep/SciFit for LE strengthening and endurance. continue sit <> stand training, standing balance activites/dynamic gait in // bars with decr UE support.  monitor fatigue.    PT Home Exercise Plan  [id number] plus seated hip ADD pillow squeezes    Consulted and Agree with Plan of Care  Patient       Patient will benefit from skilled therapeutic intervention in order to improve the following deficits and impairments:  Abnormal gait, Decreased activity tolerance, Decreased coordination, Decreased balance, Decreased endurance, Decreased strength, Difficulty walking, Decreased mobility  Visit Diagnosis: Muscle weakness (generalized)  Difficulty in walking, not elsewhere classified  Other abnormalities of gait and mobility  Other symptoms and signs involving the nervous system     Problem List Patient Active Problem List   Diagnosis Date Noted  . Overactive bladder 03/28/2019  . Guillain Barr syndrome ([place]) 01/19/2019  . Cervical myofascial pain syndrome 12/16/2018  . Carpal tunnel syndrome 12/16/2018  . Axonal GBS (Guillain-Barre syndrome) ([place]) 12/15/2018  . Leg weakness, bilateral  12/08/2018  . IBS (irritable bowel syndrome) 12/08/2018  . GERD (gastroesophageal reflux disease) 09/14/2018  . 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 Abdominal pain, epigastric 06/03/2017  . Constipation 01/27/2016  . History of adenomatous polyp of colon 01/27/2016    Narda Bonds, PTA [place] 06/21/19 11:23 AM Phone: [phone] Fax: [facility] [address] [place], [place], [address] Phone: [phone]   Fax:  [phone]  Name: INDU GOYETTE MRN: [id number] Date of Birth: April 23, 1955

## 2019-06-26 ENCOUNTER — Ambulatory Visit: Payer: Medicare Other | Admitting: Physical Therapy

## 2019-06-26 ENCOUNTER — Other Ambulatory Visit (INDEPENDENT_AMBULATORY_CARE_PROVIDER_SITE_OTHER): Payer: Self-pay

## 2019-06-26 ENCOUNTER — Other Ambulatory Visit: Payer: Self-pay

## 2019-06-26 DIAGNOSIS — M6281 Muscle weakness (generalized): Secondary | ICD-10-CM | POA: Diagnosis not present

## 2019-06-26 DIAGNOSIS — R2689 Other abnormalities of gait and mobility: Secondary | ICD-10-CM | POA: Diagnosis not present

## 2019-06-26 DIAGNOSIS — R29818 Other symptoms and signs involving the nervous system: Secondary | ICD-10-CM | POA: Diagnosis not present

## 2019-06-26 DIAGNOSIS — R262 Difficulty in walking, not elsewhere classified: Secondary | ICD-10-CM | POA: Diagnosis not present

## 2019-06-26 NOTE — Therapy (Signed)
[facility] [address] [place], [place], [address] Phone: [phone]   Fax:  [phone]  Physical Therapy Treatment  Patient Details  Name: Paula Sutton MRN: [id number] Date of Birth: Nov 03, 1955 Referring Provider (PT): Courtney Heys, MD   Encounter Date: 06/26/2019  PT End of Session - 06/26/19 1413    Visit Number  39    Number of Visits  42    Date for PT Re-Evaluation  07/29/19    Authorization Type  [employer], Medicare Secondary    PT Start Time  1318    PT Stop Time  1358    PT Time Calculation (min)  40 min    Equipment Utilized During Treatment  Gait belt    Activity Tolerance  Patient tolerated treatment well    Behavior During Therapy  WFL for tasks assessed/performed       Past Medical History:  Diagnosis Date  . Arthritis    knee  . Dyslipidemia   . GERD (gastroesophageal reflux disease)   . Left anterior fascicular block    PATIENT SAYS THIS SHOWED ON EKG DURING COLONOSCOPY , WAS REFERRED TO CARDIOLOGY DR. Minus Breeding  (SEE EPIC ENCOUNTER 09-2017)       Past Surgical History:  Procedure Laterality Date  . ABDOMINAL HYSTERECTOMY    . BIOPSY  08/09/2017   Procedure: BIOPSY;  Surgeon: Danie Binder, MD;  [facility];  Service: Endoscopy;;  gastric biopsy for h pylori  . CARPAL TUNNEL RELEASE Right 12/07/2018  . CATARACT EXTRACTION, BILATERAL     DID 1 YEAR APART , LAST ONE WAS 12-2017  . COLONOSCOPY WITH ESOPHAGOGASTRODUODENOSCOPY (EGD)  2014   [place] PROPOFOL N/A 08/09/2017   3 simple adenomas, diverticulosis in recto-sigmoid, sigmoid, and descending colin, moderate external and internal hemorrhoids  . ESOPHAGOGASTRODUODENOSCOPY (EGD) WITH PROPOFOL N/A 08/09/2017   Low-grade narrowing Schatzki ring due to GERD; small hiatal hernia; mild gastritis and duodenitis due to NSAIDs; biopsy with gastritis  . KNEE ARTHROSCOPY Right 11/2017   WITH DR Wynelle Link  AT [place] Right   . POLYPECTOMY  08/09/2017   Procedure: POLYPECTOMY;  Surgeon: Danie Binder, MD;  [facility];  Service: Endoscopy;;  cecal polyp hs, transverse colon polyp hs, rectal polyp cs  . TOTAL KNEE REVISION Right 03/01/2018   Procedure: RIGHT TOTAL KNEE REVISION;  Surgeon: Gaynelle Arabian, MD;  Location: [facility];  Service: Orthopedics;  Laterality: Right;  124mn    There were no vitals filed for this visit.  Subjective Assessment - 06/26/19 1321    Subjective  Leaving for vacation on Thursday - will be gone for about a month. Sees Dr. LDagoberto Ligastomorrow. Sunday was the best day that she has had ever had - was not sore and could move better. Legs feel stronger - especially knees. Is gonna also try to go see a podiatrist.    Patient is accompained by:  Family member    Pertinent History  GBS (diagnosed 9/20) history of OSA, GERD, recent CTR right hand    How long can you walk comfortably?  10-15 minutes or 260'    Diagnostic tests  MRI - had imaging done on brain last week, waiting for results -Had  a nerve conduction test last week: 50% on L, 25% on R    Patient Stated Goals  wants to strengthen R leg, wants to get well enough to  go dinner and dancing.    Currently in Pain?  No/denies         [facility] PT Assessment - 06/26/19 1337      Berg Balance Test   Sit to Stand  Able to stand  independently using hands    Standing Unsupported  Able to stand safely 2 minutes    Sitting with Back Unsupported but Feet Supported on Floor or Stool  Able to sit safely and securely 2 minutes    Stand to Sit  Sits safely with minimal use of hands    Transfers  Able to transfer safely, minor use of hands    Standing Unsupported with Eyes Closed  Able to stand 10 seconds safely    Standing Unsupported with Feet Together  Able to place feet together independently and stand 1 minute safely    From Standing, Reach Forward with Outstretched Arm  Can  reach confidently >25 cm (10")    From Standing Position, Pick up Object from Floor  Able to pick up shoe safely and easily    From Standing Position, Turn to Look Behind Over each Shoulder  Looks behind from both sides and weight shifts well    Turn 360 Degrees  Able to turn 360 degrees safely but slowly   4.32 to R,  5.16 to L   Standing Unsupported, Alternately Place Feet on Step/Stool  Able to complete >2 steps/needs minimal assist    Standing Unsupported, One Foot in Front  Able to plae foot ahead of the other independently and hold 30 seconds    Standing on One Leg  Able to lift leg independently and hold 5-10 seconds   7 SECONDS   Total Score  48    Berg comment:  48/56                   [facility] Adult PT Treatment/Exercise - 06/26/19 1337      Transfers   Transfers  Sit to Stand;Stand to Sit    Sit to Stand  With upper extremity assist;From chair/3-in-1;From bed    Stand to Sit  5: Supervision;With upper extremity assist;To bed;To chair/3-in-1    Comments  30 second chair stand: 11 sit to stands from standard height chair with BUE support      Ambulation/Gait   Ambulation/Gait  Yes    Ambulation/Gait Assistance  5: Supervision    Ambulation Distance (Feet)  115 Feet    Assistive device  Straight cane   with quad tip   Gait Pattern  Step-through pattern;Decreased hip/knee flexion - right;Decreased hip/knee flexion - left    Ambulation Surface  Level;Indoor    Gait velocity  16.47 seconds with SPC with quad tip = 2.0 ft/sec    Gait Comments  put in a small heel lift into pt's L shoe due to pt reporting a leg length discrepancy after her knee surgeries and has had one in the past, with pt reporting feeling more balanced with gait with RW and SPC, provided pt with one until she follows up with podiatry to be further assessed      Exercises   Exercises  Other Exercises    Other Exercises   At edge of mat table: Sit to stands with green band around distal thighs for incr  hip ABD activation 1 x 10 reps - need for BUE support to push up, 1 x 8 reps mini squats with BUE support on chair and 2" block under LLE for incr weight  shift and weight bearing to R       Knee/Hip Exercises: Supine   Bridges  Strengthening;1 set;10 reps    Bridges Limitations  with green theraband for hip ABD             PT Education - 06/26/19 1413    Education Details  progress towards goals, continuing with HEP    Person(s) Educated  Patient    Methods  Explanation    Comprehension  Verbalized understanding       PT Short Term Goals - 05/29/19 1324      PT SHORT TERM GOAL #1   Title  Patient will ambulate at least 300' outdoors over unlevel surfaces with LRAD as well as perform a curb with supervision in order to improve community mobility. ALL STGS DUE 05/28/19.    Baseline  deferred today due to weather.    Time  4    Period  Weeks    Status  Deferred    Target Date  05/28/19      PT SHORT TERM GOAL #2   Title  Patient will increase gait speed to at least 2.2 ft/sec with RW in order to decrease risk of falls.    Baseline  12.97 seconds = 2.53 ft/sec    Time  4    Period  Weeks    Status  Achieved      PT SHORT TERM GOAL #3   Title  Patient will decrease 5 times sit <> stand score to 17 seconds or less with BUE support in order to improve functional LE strength.    Baseline  15.56 seconds on 05/29/19 with BUE support    Time  4    Period  Weeks    Status  Achieved        PT Long Term Goals - 06/26/19 1337      PT LONG TERM GOAL #1   Title  Patient will be independent with final HEP in order to build upon functional gains in therapy. ALL LTGS DUE 06/25/19    Baseline  pt will benefit from on-going additions to HEP    Time  8    Period  Weeks    Status  Achieved      PT LONG TERM GOAL #2   Title  Patient will increase baseline BERG score to at least 48/56 in order to demo decr fall risk.    Baseline  48/56 on 06/26/19    Time  8    Period  Weeks     Status  Achieved      PT LONG TERM GOAL #3   Title  Patient will ambulate at least 115' indoors with LRAD with supervision in order to improve household mobility.    Baseline  met on 06/26/19 with SPC with quad tip    Time  8    Period  Weeks    Status  Achieved      PT LONG TERM GOAL #4   Title  Patient will perform TUG in 17 seconds or less with LRAD in order to demo decreased fall risk.    Baseline  18.4 seconds with SPC with quad tip    Time  8    Period  Weeks    Status  Not Met      PT LONG TERM GOAL #5   Title  Pt will perform at least 11 sit <> stands in 30 seconds with BUE support from standard arm chair in  order to demo improved endurance and functional LE strength.    Baseline  11 on 06/26/19    Time  8    Period  Weeks    Status  Achieved      PT LONG TERM GOAL #6   Title  Patient will improve gait speed to at least 2.2 ft/sec using SPC in order to demo decreased fall risk and improved household mobility.    Baseline  2.0 ft/sec with SPC on 06/26/19    Time  8    Period  Weeks    Status  Not Met            Plan - 06/26/19 1453    Clinical Impression Statement  Focus of today's skilled session was assessing pt's LTGs with pt achieving 4 out of 6 LTGs. Pt incr BERG score to a 48/56 indicating a moderate risk of falls. Improved 30 second chair stand to performing 11 sit <> stands with BUE support (previously was 9). Pt's gait speed with SPC with quad tip improved to 2.0 ft/sec, but not quite to goal level. Pt performed the TUG in 18.4 seconds with SPC with quad tip indicating a higher risk for falls. Provided pt a small heel lift in L foot due to leg length discrepancy from previous knee surgeries with pt reporting feeling more stable and balanced - pt also to make appointment with podiatry for further assessment. Pt will be traveling for the next 3-4 weeks and will be put on hold at this time. Pt to call and make appointment when she knows she will be back, will perform  re-cert and update goals at that time. Pt will continue to benefit from skilled PT to address BLE strength, balance, and gait in order to incr functional independence and decr risk of falls.    Personal Factors and Comorbidities  Comorbidity 2;Past/Current Experience    Comorbidities  OSA, GERD    Examination-Activity Limitations  Locomotion Level;Transfers;Stairs;Stand    Examination-Participation Restrictions  Community Activity    Stability/Clinical Decision Making  Evolving/Moderate complexity    Rehab Potential  Good    PT Frequency  2x / week    PT Duration  8 weeks    PT Treatment/Interventions  ADLs/Self Care Home Management;Electrical Stimulation;Aquatic Therapy;Gait training;DME Instruction;Stair training;Functional mobility training;Therapeutic activities;Therapeutic exercise;Patient/family education;Neuromuscular re-education;Balance training;Passive range of motion    PT Next Visit Plan  update goals, will need re-cert. gait training with SPC with quad tip. hip ABD strengthening. Step ups/step taps on 4"/6" step. NuStep/SciFit for LE strengthening and endurance. continue sit <> stand training, standing balance activites/dynamic gait in // bars with decr UE support.  monitor fatigue.    PT Home Exercise Plan  [id number] plus seated hip ADD pillow squeezes    Consulted and Agree with Plan of Care  Patient       Patient will benefit from skilled therapeutic intervention in order to improve the following deficits and impairments:  Abnormal gait, Decreased activity tolerance, Decreased coordination, Decreased balance, Decreased endurance, Decreased strength, Difficulty walking, Decreased mobility  Visit Diagnosis: Muscle weakness (generalized)  Difficulty in walking, not elsewhere classified  Other abnormalities of gait and mobility  Other symptoms and signs involving the nervous system     Problem List Patient Active Problem List   Diagnosis Date Noted  . Overactive bladder  03/28/2019  . Guillain Barr syndrome ([place]) 01/19/2019  . Cervical myofascial pain syndrome 12/16/2018  . Carpal tunnel syndrome 12/16/2018  . Axonal GBS (Guillain-Barre syndrome) (  [place]) 12/15/2018  . Leg weakness, bilateral 12/08/2018  . IBS (irritable bowel syndrome) 12/08/2018  . GERD (gastroesophageal reflux disease) 09/14/2018  . 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 Abdominal pain, epigastric 06/03/2017  . Constipation 01/27/2016  . History of adenomatous polyp of colon 01/27/2016    Arliss Journey, PT, DPT  06/26/2019, 2:55 PM  [facility] [address] [place], [place], [address] Phone: [phone]   Fax:  [phone]  Name: SHANQUITA RONNING MRN: [id number] Date of Birth: 03/18/1955

## 2019-06-27 ENCOUNTER — Encounter: Payer: Medicare Other | Attending: Physical Medicine & Rehabilitation | Admitting: Physical Medicine and Rehabilitation

## 2019-06-27 ENCOUNTER — Ambulatory Visit: Payer: Medicare Other | Admitting: Physical Therapy

## 2019-06-27 ENCOUNTER — Encounter: Payer: Self-pay | Admitting: Physical Medicine and Rehabilitation

## 2019-06-27 ENCOUNTER — Encounter: Payer: Self-pay | Admitting: Physical Therapy

## 2019-06-27 ENCOUNTER — Other Ambulatory Visit: Payer: Self-pay

## 2019-06-27 VITALS — BP 148/78 | HR 69 | Temp 96.6°F | Ht 63.0 in | Wt 154.2 lb

## 2019-06-27 DIAGNOSIS — G5601 Carpal tunnel syndrome, right upper limb: Secondary | ICD-10-CM | POA: Insufficient documentation

## 2019-06-27 DIAGNOSIS — R29898 Other symptoms and signs involving the musculoskeletal system: Secondary | ICD-10-CM | POA: Diagnosis not present

## 2019-06-27 DIAGNOSIS — N3281 Overactive bladder: Secondary | ICD-10-CM | POA: Insufficient documentation

## 2019-06-27 DIAGNOSIS — G610 Guillain-Barre syndrome: Secondary | ICD-10-CM | POA: Diagnosis not present

## 2019-06-27 DIAGNOSIS — R29818 Other symptoms and signs involving the nervous system: Secondary | ICD-10-CM

## 2019-06-27 DIAGNOSIS — R262 Difficulty in walking, not elsewhere classified: Secondary | ICD-10-CM | POA: Diagnosis not present

## 2019-06-27 DIAGNOSIS — M6281 Muscle weakness (generalized): Secondary | ICD-10-CM

## 2019-06-27 DIAGNOSIS — R2689 Other abnormalities of gait and mobility: Secondary | ICD-10-CM | POA: Diagnosis not present

## 2019-06-27 DIAGNOSIS — R296 Repeated falls: Secondary | ICD-10-CM | POA: Diagnosis not present

## 2019-06-27 NOTE — Patient Instructions (Signed)
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 And Overactive bladder Sx's which is resolved   1. Try to wean Tramadol -  So can break in half with pill cutter and do 1/2 pill daily x 2 weeks- then stop- can take 1/2 pill as needed occ, but not daily.  Wait until back from [place]  2. Con't Duloxetine at least until next visit- until she has no more nerve pain, then can wean.  3. Can replace tramadol with tylenol as needed- if pain IS a problem coming off tramadol, let me know.   4. Filled out handicapped parking permit- needs to be renewed- gave for 5 years.   5.  Discussed prognosis. - AIDP- not CIDP. Still think 12-18 months  6. Since no meds anymore from Neuro perspective- suggest no Neuro anymore.   7. Referred to podiatry Dr Jacqualyn Posey. Like him a lot. Reminded her to use RW to walk!  8. F/U in 3 months

## 2019-06-27 NOTE — Therapy (Signed)
[facility] [address] [place], [place], [address] Phone: [phone]   Fax:  [phone]  Physical Therapy Treatment  Patient Details  Name: Paula Sutton MRN: [id number] Date of Birth: 1955/06/27 Referring Provider (PT): Courtney Heys, MD   Encounter Date: 06/27/2019  PT End of Session - 06/27/19 1930    Visit Number  40    Number of Visits  42    Date for PT Re-Evaluation  07/29/19    Authorization Type  [employer], Medicare Secondary    PT Start Time  2458    PT Stop Time  1500    PT Time Calculation (min)  45 min    Equipment Utilized During Treatment  Other (comment)   ankle buoyancy cuffs   Activity Tolerance  Patient tolerated treatment well    Behavior During Therapy  [facility] for tasks assessed/performed       Past Medical History:  Diagnosis Date  . Arthritis    knee  . Dyslipidemia   . GERD (gastroesophageal reflux disease)   . Left anterior fascicular block    PATIENT SAYS THIS SHOWED ON EKG DURING COLONOSCOPY , WAS REFERRED TO CARDIOLOGY DR. Minus Breeding  (SEE EPIC ENCOUNTER 09-2017)       Past Surgical History:  Procedure Laterality Date  . ABDOMINAL HYSTERECTOMY    . BIOPSY  08/09/2017   Procedure: BIOPSY;  Surgeon: Danie Binder, MD;  [facility];  Service: Endoscopy;;  gastric biopsy for h pylori  . CARPAL TUNNEL RELEASE Right 12/07/2018  . CATARACT EXTRACTION, BILATERAL     DID 1 YEAR APART , LAST ONE WAS 12-2017  . COLONOSCOPY WITH ESOPHAGOGASTRODUODENOSCOPY (EGD)  2014   [place] PROPOFOL N/A 08/09/2017   3 simple adenomas, diverticulosis in recto-sigmoid, sigmoid, and descending colin, moderate external and internal hemorrhoids  . ESOPHAGOGASTRODUODENOSCOPY (EGD) WITH PROPOFOL N/A 08/09/2017   Low-grade narrowing Schatzki ring due to GERD; small hiatal hernia; mild gastritis and duodenitis due to NSAIDs; biopsy with gastritis  . KNEE  ARTHROSCOPY Right 11/2017   WITH DR Wynelle Link  AT [place] Right   . POLYPECTOMY  08/09/2017   Procedure: POLYPECTOMY;  Surgeon: Danie Binder, MD;  [facility];  Service: Endoscopy;;  cecal polyp hs, transverse colon polyp hs, rectal polyp cs  . TOTAL KNEE REVISION Right 03/01/2018   Procedure: RIGHT TOTAL KNEE REVISION;  Surgeon: Gaynelle Arabian, MD;  [facility];  Service: Orthopedics;  Laterality: Right;  134mn    There were no vitals filed for this visit.  Subjective Assessment - 06/27/19 1928    Subjective  Denies any falls or changes.  Has had several great days over the past week with no pain and increased strength.    Patient is accompained by:  Family member    Pertinent History  GBS (diagnosed 9/20) history of OSA, GERD, recent CTR right hand    How long can you walk comfortably?  10-15 minutes or 260'    Diagnostic tests  MRI - had imaging done on brain last week, waiting for results -Had  a nerve conduction test last week: 50% on L, 25% on R    Patient Stated Goals  wants to strengthen R leg, wants to get well enough to go dinner and dancing.    Currently in Pain?  No/denies       [facility] temp  86.7degrees  Patient seen for aquatic therapy today. Treatment took place in water 3.5-4 feet deep depending upon activity. Pt entered and exited the pool via ramp with use of 1 rail and supervision:   Runners stretch bil LE's and toes/feet up edge of pool x 30 seconds each bil LE's.  Pt performed gait training in pool forwards 72mx 2 reps, 288m 2 working on increased arm swing and 203m2 repsbackwards. Performed side stepping 73m7m then side stepping squatsx 73m 52mCues for technique/sequence.  Standing with ankle buoyancy cuffs with 1-0 UE support of pool edge for bil LE marching, SLR hip flexionhip extension, hip abd, hamstring curl. Pt able to perform 15 reps each side. Pt able to tolerate cuff for  resistance with knee extension following hamstring curl today and has been unable to perform previously.  Also less UE support needed today.  Cues for knee extension with hip extension and SLR hip flexion.  Squats while standing at pool edge for UE support x 10 repsthen 10 reps without UE assist. Performed single leg squats x 10 reps each sidewith UE support.  Forward<>backward step weight shifting without UE support.  Pt requires buoyancy for support with balance and viscosity for resistance for strengthening exercises; buoyancy is also needed for off loading body to assist with exercises.  Pt with improved endurance today and no rest breaks needed despite increased reps.   PT Short Term Goals - 05/29/19 1324      PT SHORT TERM GOAL #1   Title  Patient will ambulate at least 300' outdoors over unlevel surfaces with LRAD as well as perform a curb with supervision in order to improve community mobility. ALL STGS DUE 05/28/19.    Baseline  deferred today due to weather.    Time  4    Period  Weeks    Status  Deferred    Target Date  05/28/19      PT SHORT TERM GOAL #2   Title  Patient will increase gait speed to at least 2.2 ft/sec with RW in order to decrease risk of falls.    Baseline  12.97 seconds = 2.53 ft/sec    Time  4    Period  Weeks    Status  Achieved      PT SHORT TERM GOAL #3   Title  Patient will decrease 5 times sit <> stand score to 17 seconds or less with BUE support in order to improve functional LE strength.    Baseline  15.56 seconds on 05/29/19 with BUE support    Time  4    Period  Weeks    Status  Achieved        PT Long Term Goals - 06/26/19 1337      PT LONG TERM GOAL #1   Title  Patient will be independent with final HEP in order to build upon functional gains in therapy. ALL LTGS DUE 06/25/19    Baseline  pt will benefit from on-going additions to HEP    Time  8    Period  Weeks    Status  Achieved      PT LONG TERM GOAL #2   Title   Patient will increase baseline BERG score to at least 48/56 in order to demo decr fall risk.    Baseline  48/56 on 06/26/19    Time  8    Period  Weeks    Status  Achieved  PT LONG TERM GOAL #3   Title  Patient will ambulate at least 115' indoors with LRAD with supervision in order to improve household mobility.    Baseline  met on 06/26/19 with SPC with quad tip    Time  8    Period  Weeks    Status  Achieved      PT LONG TERM GOAL #4   Title  Patient will perform TUG in 17 seconds or less with LRAD in order to demo decreased fall risk.    Baseline  18.4 seconds with SPC with quad tip    Time  8    Period  Weeks    Status  Not Met      PT LONG TERM GOAL #5   Title  Pt will perform at least 11 sit <> stands in 30 seconds with BUE support from standard arm chair in order to demo improved endurance and functional LE strength.    Baseline  11 on 06/26/19    Time  8    Period  Weeks    Status  Achieved      PT LONG TERM GOAL #6   Title  Patient will improve gait speed to at least 2.2 ft/sec using SPC in order to demo decreased fall risk and improved household mobility.    Baseline  2.0 ft/sec with SPC on 06/26/19    Time  8    Period  Weeks    Status  Not Met            Plan - 06/27/19 1931    Clinical Impression Statement  Pt with improved LE strength and able to perform LE hamstring curl/extension with buoyancy cuffs today.  Pt going out of town for 3-4 weeks and will contact primary MD upon return for recertification and resuming PT.    Personal Factors and Comorbidities  Comorbidity 2;Past/Current Experience    Comorbidities  OSA, GERD    Examination-Activity Limitations  Locomotion Level;Transfers;Stairs;Stand    Examination-Participation Restrictions  Community Activity    Stability/Clinical Decision Making  Evolving/Moderate complexity    Rehab Potential  Good    PT Frequency  2x / week    PT Duration  8 weeks    PT Treatment/Interventions  ADLs/Self Care Home  Management;Electrical Stimulation;Aquatic Therapy;Gait training;DME Instruction;Stair training;Functional mobility training;Therapeutic activities;Therapeutic exercise;Patient/family education;Neuromuscular re-education;Balance training;Passive range of motion    PT Next Visit Plan  update goals, will need re-cert. gait training with SPC with quad tip. hip ABD strengthening. Step ups/step taps on 4"/6" step. NuStep/SciFit for LE strengthening and endurance. continue sit <> stand training, standing balance activites/dynamic gait in // bars with decr UE support.  monitor fatigue.    PT Home Exercise Plan  [id number] plus seated hip ADD pillow squeezes    Consulted and Agree with Plan of Care  Patient       Patient will benefit from skilled therapeutic intervention in order to improve the following deficits and impairments:  Abnormal gait, Decreased activity tolerance, Decreased coordination, Decreased balance, Decreased endurance, Decreased strength, Difficulty walking, Decreased mobility  Visit Diagnosis: Muscle weakness (generalized)  Difficulty in walking, not elsewhere classified  Other abnormalities of gait and mobility  Other symptoms and signs involving the nervous system     Problem List Patient Active Problem List   Diagnosis Date Noted  . Frequent falls 06/27/2019  . Overactive bladder 03/28/2019  . Guillain Barr syndrome ([place]) 01/19/2019  . Cervical myofascial pain syndrome 12/16/2018  . Carpal  tunnel syndrome 12/16/2018  . Axonal GBS (Guillain-Barre syndrome) ([place]) 12/15/2018  . Leg weakness, bilateral 12/08/2018  . IBS (irritable bowel syndrome) 12/08/2018  . GERD (gastroesophageal reflux disease) 09/14/2018  . 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 Abdominal pain, epigastric 06/03/2017  . Constipation 01/27/2016  . History of adenomatous polyp of colon 01/27/2016    Narda Bonds, PTA [facility] 06/27/19 7:38  PM Phone: [phone] Fax: [facility] [address] [place], [place], [address] Phone: [phone]   Fax:  [phone]  Name: Paula Sutton MRN: [id number] Date of Birth: 06/05/1955

## 2019-06-27 NOTE — Progress Notes (Signed)
Subjective:    Patient ID: Paula Sutton, female    DOB: January 25, 1956, 64 y.o.   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 And Overactive bladder Sx's   Had been feeling good since Sunday.  Doing more stretches during day due to being sore and tight. That's working.   Enjoys water therapy.   Still has pain in L calf and thigh- anterior When exercises, feet burns a little.   Doing good overall.   Hasn't weaned from toilet seat because so high up. Using [facility] over toilet seat.   Just using w/c for a chair.   Going a trip - to [place]- 3 days to get there- taking w/c just in case- then [place] after that.    Overactive bladder subsided pretty much- except occ at night.  Kidney function has improved drastically.   Bowels doing well- now has complete control of bowels.   Put on Wellbutrin- since as so tired.   PCP was wondering if could wean Duloxetine- but still having burinng in feet Still taking tramadol- 1x/day- went through 'withdrawal" from 1 day running out of tramadol-  So worried about coming off it.   Sometimes L knee with spasm and kick out- weird feeling, but not painful.    Is falling a lot- when not using rolling walker-    Pain Inventory Average Pain 1 Pain Right Now 1 My pain is burning and aching  In the last 24 hours, has pain interfered with the following? General activity 0 Relation with others 0 Enjoyment of life 0 What TIME of day is your pain at its worst? night Sleep (in general) Fair  Pain is worse with: some activites Pain improves with: medication Relief from Meds: na  Mobility walk without assistance use a cane use a walker ability to climb steps?  no do you drive?  no  Function not employed: date last employed . I need assistance with the following:  shopping  Neuro/Psych bladder control problems spasms confusion  Prior Studies Any changes since last visit?  no  Physicians  involved in your care Any changes since last visit?  no   Family History  Problem Relation Age of Onset  . Alcohol abuse Mother   . Colon cancer Neg Hx   . Breast cancer Neg Hx    Social History   Socioeconomic History  . Marital status: Married    Spouse name: Not on file  . Number of children: Not on file  . Years of education: Not on file  . Highest education level: Not on file  Occupational History  . Occupation: Retired     Comment: Glass blower/designer  Tobacco Use  . Smoking status: Former Smoker    Types: Cigarettes    Quit date: 01/27/1971    Years since quitting: 48.4  . Smokeless tobacco: Never Used  Substance and Sexual Activity  . Alcohol use: Yes    Comment: Occasional: 1-2 times/month  . Drug use: Yes    Frequency: 7.0 times per week    Types: Marijuana  . Sexual activity: Not on file  Other Topics Concern  . Not on file  Social History Narrative   Lives at home with husband.   Social Determinants of Health   Financial Resource Strain:   . Difficulty of Paying Living Expenses:   Food Insecurity:   . Worried About Charity fundraiser in the Last Year:   .  Ran Out of Food in the Last Year:   Transportation Needs:   . Film/video editor (Medical):   Marland Kitchen Lack of Transportation (Non-Medical):   Physical Activity:   . Days of Exercise per Week:   . Minutes of Exercise per Session:   Stress:   . Feeling of Stress :   Social Connections:   . Frequency of Communication with Friends and Family:   . Frequency of Social Gatherings with Friends and Family:   . Attends Religious Services:   . Active Member of Clubs or Organizations:   . Attends Archivist Meetings:   Marland Kitchen Marital Status:    Past Surgical History:  Procedure Laterality Date  . ABDOMINAL HYSTERECTOMY    . BIOPSY  08/09/2017   Procedure: BIOPSY;  Surgeon: Danie Binder, MD;  [facility];  Service: Endoscopy;;  gastric biopsy for h pylori  . CARPAL TUNNEL RELEASE Right  12/07/2018  . CATARACT EXTRACTION, BILATERAL     DID 1 YEAR APART , LAST ONE WAS 12-2017  . COLONOSCOPY WITH ESOPHAGOGASTRODUODENOSCOPY (EGD)  2014   [place] PROPOFOL N/A 08/09/2017   3 simple adenomas, diverticulosis in recto-sigmoid, sigmoid, and descending colin, moderate external and internal hemorrhoids  . ESOPHAGOGASTRODUODENOSCOPY (EGD) WITH PROPOFOL N/A 08/09/2017   Low-grade narrowing Schatzki ring due to GERD; small hiatal hernia; mild gastritis and duodenitis due to NSAIDs; biopsy with gastritis  . KNEE ARTHROSCOPY Right 11/2017   WITH DR Wynelle Link  AT [place] Right   . POLYPECTOMY  08/09/2017   Procedure: POLYPECTOMY;  Surgeon: Danie Binder, MD;  [facility];  Service: Endoscopy;;  cecal polyp hs, transverse colon polyp hs, rectal polyp cs  . TOTAL KNEE REVISION Right 03/01/2018   Procedure: RIGHT TOTAL KNEE REVISION;  Surgeon: Gaynelle Arabian, MD;  Location: [facility];  Service: Orthopedics;  Laterality: Right;  115min   Past Medical History:  Diagnosis Date  . Arthritis    knee  . Dyslipidemia   . GERD (gastroesophageal reflux disease)   . Left anterior fascicular block    PATIENT SAYS THIS SHOWED ON EKG DURING COLONOSCOPY , WAS REFERRED TO CARDIOLOGY DR. Jeneen Rinks HOCHREIN  (SEE EPIC ENCOUNTER 09-2017)      BP (!) 148/78   Pulse 69   Temp (!) 96.6 F (35.9 C)   Ht 5\' 3"  (1.6 m)   Wt 154 lb 3.2 oz (69.9 kg)   SpO2 98%   BMI 27.32 kg/m   Opioid Risk Score:   Fall Risk Score:  `1  Depression screen PHQ 2/9  Depression screen PHQ 2/9 06/27/2019  Decreased Interest 0  Down, Depressed, Hopeless 0  PHQ - 2 Score 0    Review of Systems  Constitutional: Negative.   HENT: Negative.   Eyes: Negative.   Respiratory: Negative.   Cardiovascular: Negative.   Gastrointestinal: Positive for nausea.  Endocrine: Negative.   Genitourinary:       Bladder control  Musculoskeletal:       Spasms  Skin:  Negative.   Allergic/Immunologic: Negative.   Neurological: Negative.   Hematological: Negative.   Psychiatric/Behavioral: Positive for confusion.  All other systems reviewed and are negative.      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 And Overactive bladder Sx's which is resolved   1. Try to wean Tramadol -  So can break in half with pill cutter and do 1/2 pill daily x 2 weeks- then stop- can take 1/2 pill as needed occ, but not daily.  Wait until back from [place]  2. Con't Duloxetine at least until next visit- until she has no more nerve pain, then can wean.  3. Can replace tramadol with tylenol as needed- if pain IS a problem coming off tramadol, let me know.   4. Filled out handicapped parking permit- needs to be renewed- gave for 5 years.   5.  Discussed prognosis. - AIDP- not CIDP. Still think 12-18 months  6. Since no meds anymore from Neuro perspective- suggest no Neuro anymore.   7. Referred to podiatry Dr Jacqualyn Posey. Like him a lot. Reminded her to use RW to walk!  8. F/U in 3 months   I spent a total of 35 minutes on appointment- more than 20 minutes spent on discussing prognosis and pain control

## 2019-06-29 ENCOUNTER — Ambulatory Visit: Payer: Medicare Other | Admitting: Physical Therapy

## 2019-07-03 ENCOUNTER — Ambulatory Visit: Payer: Medicare Other | Admitting: Physical Therapy

## 2019-07-06 ENCOUNTER — Ambulatory Visit: Payer: Medicare Other | Admitting: Physical Therapy

## 2019-07-10 ENCOUNTER — Ambulatory Visit: Payer: Medicare Other | Admitting: Physical Therapy

## 2019-07-13 ENCOUNTER — Ambulatory Visit: Payer: Medicare Other | Admitting: Physical Therapy

## 2019-08-06 DIAGNOSIS — M47812 Spondylosis without myelopathy or radiculopathy, cervical region: Secondary | ICD-10-CM | POA: Diagnosis not present

## 2019-08-06 DIAGNOSIS — M9902 Segmental and somatic dysfunction of thoracic region: Secondary | ICD-10-CM | POA: Diagnosis not present

## 2019-08-06 DIAGNOSIS — M546 Pain in thoracic spine: Secondary | ICD-10-CM | POA: Diagnosis not present

## 2019-08-06 DIAGNOSIS — M9901 Segmental and somatic dysfunction of cervical region: Secondary | ICD-10-CM | POA: Diagnosis not present

## 2019-08-06 DIAGNOSIS — M9903 Segmental and somatic dysfunction of lumbar region: Secondary | ICD-10-CM | POA: Diagnosis not present

## 2019-08-07 ENCOUNTER — Encounter: Payer: Self-pay | Admitting: Gastroenterology

## 2019-08-14 DIAGNOSIS — H43813 Vitreous degeneration, bilateral: Secondary | ICD-10-CM | POA: Diagnosis not present

## 2019-08-14 DIAGNOSIS — H26492 Other secondary cataract, left eye: Secondary | ICD-10-CM | POA: Diagnosis not present

## 2019-08-14 DIAGNOSIS — H04123 Dry eye syndrome of bilateral lacrimal glands: Secondary | ICD-10-CM | POA: Diagnosis not present

## 2019-08-14 DIAGNOSIS — H524 Presbyopia: Secondary | ICD-10-CM | POA: Diagnosis not present

## 2019-08-16 ENCOUNTER — Telehealth: Payer: Self-pay

## 2019-08-16 NOTE — Telephone Encounter (Signed)
Patient called and states she has weaned herself off Tramadol - you recommended a nonnarcotic once she was off - can you Rx to [facility] her number is [phone]

## 2019-08-17 MED ORDER — DICLOFENAC SODIUM 50 MG PO TBEC: 50.0000 mg | DELAYED_RELEASE_TABLET | Freq: Two times a day (BID) | ORAL | 5 refills | Status: DC | PRN

## 2019-08-17 NOTE — Telephone Encounter (Signed)
Came off tramadol- wasn't bad at all.   Tylenol not enough when trying to exercise/walk for 25 minutes.   Having inflammation in joints and thinks NSAIDs sounds good for pain.   Will call in Diclofenac 50 mg 2x/day as needed for pain- can take NSAIDs per pt.   Discussed plan with pt.

## 2019-09-12 DIAGNOSIS — M9903 Segmental and somatic dysfunction of lumbar region: Secondary | ICD-10-CM | POA: Diagnosis not present

## 2019-09-12 DIAGNOSIS — M9901 Segmental and somatic dysfunction of cervical region: Secondary | ICD-10-CM | POA: Diagnosis not present

## 2019-09-12 DIAGNOSIS — M9902 Segmental and somatic dysfunction of thoracic region: Secondary | ICD-10-CM | POA: Diagnosis not present

## 2019-09-12 DIAGNOSIS — M47812 Spondylosis without myelopathy or radiculopathy, cervical region: Secondary | ICD-10-CM | POA: Diagnosis not present

## 2019-09-12 DIAGNOSIS — M546 Pain in thoracic spine: Secondary | ICD-10-CM | POA: Diagnosis not present

## 2019-09-24 ENCOUNTER — Encounter: Payer: Medicare Other | Attending: Physical Medicine & Rehabilitation | Admitting: Physical Medicine and Rehabilitation

## 2019-09-24 ENCOUNTER — Other Ambulatory Visit: Payer: Self-pay

## 2019-09-24 ENCOUNTER — Encounter: Payer: Self-pay | Admitting: Physical Medicine and Rehabilitation

## 2019-09-24 VITALS — BP 113/76 | HR 65 | Temp 98.4°F | Ht 63.0 in | Wt 153.4 lb

## 2019-09-24 DIAGNOSIS — R269 Unspecified abnormalities of gait and mobility: Secondary | ICD-10-CM | POA: Insufficient documentation

## 2019-09-24 DIAGNOSIS — G5601 Carpal tunnel syndrome, right upper limb: Secondary | ICD-10-CM | POA: Insufficient documentation

## 2019-09-24 DIAGNOSIS — G610 Guillain-Barre syndrome: Secondary | ICD-10-CM | POA: Diagnosis not present

## 2019-09-24 DIAGNOSIS — N3281 Overactive bladder: Secondary | ICD-10-CM

## 2019-09-24 DIAGNOSIS — R29898 Other symptoms and signs involving the musculoskeletal system: Secondary | ICD-10-CM | POA: Insufficient documentation

## 2019-09-24 NOTE — Patient Instructions (Signed)
1. Suggested Dr Jacqualyn Posey- podiatry per pt request for podiatry referral  2. Needs to do marching in place, type exercises to work on hip flexion- which is still weak! So much better than before.   3. Con't Diclofenac as needed.  Usually 1x/day- has 4 RFs left on Rx.    4. Can take tylenol PM if needs to go to sleep.    5. To see Urology August 2021.    6. F/U in 3 months.

## 2019-09-24 NOTE — Progress Notes (Signed)
Subjective:    Patient ID: Paula Sutton, female    DOB: 04/27/1955, 64 y.o.   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   No side effects, "DT's " from coming off tramadol.   Usually taking 1`x/day Diclofenac- very seldom, will do 2 tabs.  Still having burning in feet and L leg- not as bad as it was- esp on back of heels- hates wearing shoes.  So doesn't want to come off Duloxetine until pain resolved.   Lost the note/remarks and lost the form to know who to see for podiatry-   Went on 5 week vacation- was scheduled to do 3 weeks; went 5 weeks- went to [place], and to [place], and then [place] and [place] and saw family-  Travelled 6 hrs/day- and stayed with family most places.    Walking going well- not using walker in house anymore- confident to walk in home, but uses outside house.   No problems getting in and out of bed anymore.  Cleans house now Sleeps a little bit longer than used to.  Going shopping and doing lunch with friends now.  Got rid of shower chair Got a little bike/pedals- and does 20 minutes QOD and walks outside QOD- 3000-4000 miles/day usually.  Can do stairs now- does step-to, not step over step now.  Can get in and out of big vehicles now.  Doing quilting- back to normal now. Some nights doesn't sleep well- but takes a extra strength tylenol and can go to sleep usually.   A little soreness in groin. Does lunges and helps.  Wants to drive again. Wearing weights on ankles- 1.5 lbs 30 minutes at a time.   Having nerve pain in R buttock- like being shocked- like has TENS unit on there.  Can feel sensation/strength coming back.   Seeing Urology in August, 2021- some leakage still.  Had some leakage prior to GBS- a little harder to control than before BGS.    Pain Inventory Average Pain 2 Pain Right Now 0 My pain  is burning  In the last 24 hours, has pain interfered with the following? General activity 0 Relation with others 0 Enjoyment of life 0 What TIME of day is your pain at its worst? morning night Sleep (in general) Fair  Pain is worse with: standing Pain improves with: rest, therapy/exercise, medication and TENS Relief from Meds: na  Mobility walk without assistance use a walker how many minutes can you walk? 25 ability to climb steps?  yes do you drive?  no  Function Do you have any goals in this area?  no  Neuro/Psych bladder control problems spasms  Prior Studies Any changes since last visit?  no  Physicians involved in your care Any changes since last visit?  no   Family History  Problem Relation Age of Onset  . Alcohol abuse Mother   . Colon cancer Neg Hx   . Breast cancer Neg Hx    Social History   Socioeconomic History  . Marital status: Married    Spouse name: Not on file  . Number of children: Not on file  . Years of education: Not on file  . Highest education level: Not on file  Occupational History  . Occupation: Retired     Comment: Glass blower/designer  Tobacco Use  . Smoking status: Former  Smoker    Types: Cigarettes    Quit date: 01/27/1971    Years since quitting: 48.6  . Smokeless tobacco: Never Used  Vaping Use  . Vaping Use: Never used  Substance and Sexual Activity  . Alcohol use: Yes    Comment: Occasional: 1-2 times/month  . Drug use: Yes    Frequency: 7.0 times per week    Types: Marijuana  . Sexual activity: Not on file  Other Topics Concern  . Not on file  Social History Narrative   Lives at home with husband.   Social Determinants of Health   Financial Resource Strain:   . Difficulty of Paying Living Expenses:   Food Insecurity:   . Worried About Charity fundraiser in the Last Year:   . Arboriculturist in the Last Year:   Transportation Needs:   . Film/video editor (Medical):   Marland Kitchen Lack of Transportation  (Non-Medical):   Physical Activity:   . Days of Exercise per Week:   . Minutes of Exercise per Session:   Stress:   . Feeling of Stress :   Social Connections:   . Frequency of Communication with Friends and Family:   . Frequency of Social Gatherings with Friends and Family:   . Attends Religious Services:   . Active Member of Clubs or Organizations:   . Attends Archivist Meetings:   Marland Kitchen Marital Status:    Past Surgical History:  Procedure Laterality Date  . ABDOMINAL HYSTERECTOMY    . BIOPSY  08/09/2017   Procedure: BIOPSY;  Surgeon: Danie Binder, MD;  [facility];  Service: Endoscopy;;  gastric biopsy for h pylori  . CARPAL TUNNEL RELEASE Right 12/07/2018  . CATARACT EXTRACTION, BILATERAL     DID 1 YEAR APART , LAST ONE WAS 12-2017  . COLONOSCOPY WITH ESOPHAGOGASTRODUODENOSCOPY (EGD)  2014   [place] PROPOFOL N/A 08/09/2017   3 simple adenomas, diverticulosis in recto-sigmoid, sigmoid, and descending Paula, moderate external and internal hemorrhoids  . ESOPHAGOGASTRODUODENOSCOPY (EGD) WITH PROPOFOL N/A 08/09/2017   Low-grade narrowing Schatzki ring due to GERD; small hiatal hernia; mild gastritis and duodenitis due to NSAIDs; biopsy with gastritis  . KNEE ARTHROSCOPY Right 11/2017   WITH DR Wynelle Link  AT [place] Right   . POLYPECTOMY  08/09/2017   Procedure: POLYPECTOMY;  Surgeon: Danie Binder, MD;  [facility];  Service: Endoscopy;;  cecal polyp hs, transverse colon polyp hs, rectal polyp cs  . TOTAL KNEE REVISION Right 03/01/2018   Procedure: RIGHT TOTAL KNEE REVISION;  Surgeon: Gaynelle Arabian, MD;  [facility];  Service: Orthopedics;  Laterality: Right;  172min   Past Medical History:  Diagnosis Date  . Arthritis    knee  . Dyslipidemia   . GERD (gastroesophageal reflux disease)   . Left anterior fascicular block    PATIENT SAYS THIS SHOWED ON EKG DURING COLONOSCOPY , WAS  REFERRED TO CARDIOLOGY DR. Jeneen Rinks HOCHREIN  (SEE EPIC ENCOUNTER [phone])      BP 113/76   Pulse 65   Temp 98.4 F (36.9 C)   Ht 5\' 3"  (1.6 m)   Wt 153 lb 6.4 oz (69.6 kg)   SpO2 98%   BMI 27.17 kg/m   Opioid Risk Score:   Fall Risk Score:  `1  Depression screen PHQ 2/9  Depression screen [facility] 2/9 09/24/2019 06/27/2019  Decreased Interest 0 0  Down,  Depressed, Hopeless 0 0  PHQ - 2 Score 0 0   Review of Systems  All other systems reviewed and are negative.      Objective:   Physical Exam Awake, alert, appropriate, has rollator, NAD MS: UEs deltoid, biceps, triceps, WE, grip and finger abd all 5/5 in UEs B/L LEs- HF 2/5, KE 5-/5, KF 5-/5, DF 5-/5, PF 5/5        Assessment & Plan:   1. Suggested Dr Jacqualyn Posey- podiatry per pt request for podiatry referral  2. Needs to do marching in place, type exercises to work on hip flexion- which is still weak! So much better than before.   3. Con't Diclofenac as needed.  Usually 1x/day- has 4 RFs left on Rx.    4. Can take tylenol PM if needs to go to sleep.    5. To see Urology August 2021.    6. F/U in 3 months.   I spent a total of 22 minutes on appointment- as detailed above.

## 2019-09-26 ENCOUNTER — Ambulatory Visit: Payer: Medicare Other | Admitting: Physical Medicine and Rehabilitation

## 2019-10-01 ENCOUNTER — Ambulatory Visit: Payer: Medicare Other | Admitting: Gastroenterology

## 2019-10-11 DIAGNOSIS — M9901 Segmental and somatic dysfunction of cervical region: Secondary | ICD-10-CM | POA: Diagnosis not present

## 2019-10-11 DIAGNOSIS — M9903 Segmental and somatic dysfunction of lumbar region: Secondary | ICD-10-CM | POA: Diagnosis not present

## 2019-10-11 DIAGNOSIS — M9902 Segmental and somatic dysfunction of thoracic region: Secondary | ICD-10-CM | POA: Diagnosis not present

## 2019-10-11 DIAGNOSIS — M546 Pain in thoracic spine: Secondary | ICD-10-CM | POA: Diagnosis not present

## 2019-10-11 DIAGNOSIS — M47812 Spondylosis without myelopathy or radiculopathy, cervical region: Secondary | ICD-10-CM | POA: Diagnosis not present

## 2019-10-14 DIAGNOSIS — U071 COVID-19: Secondary | ICD-10-CM

## 2019-10-14 HISTORY — DX: COVID-19: U07.1

## 2019-11-01 ENCOUNTER — Ambulatory Visit: Payer: Medicare Other | Admitting: Gastroenterology

## 2019-11-05 ENCOUNTER — Telehealth: Payer: Self-pay | Admitting: *Deleted

## 2019-11-05 MED ORDER — DULOXETINE HCL 30 MG PO CPEP: 30.0000 mg | ORAL_CAPSULE | Freq: Every day | ORAL | 0 refills | Status: DC

## 2019-11-05 NOTE — Telephone Encounter (Signed)
Paula Sutton called and said she needs to speak with Dr Dagoberto Ligas.  She feels like her gillian barre is worse.  I have tried to reach her x 2  For more detail but no answer and does not have VM set up.

## 2019-11-05 NOTE — Telephone Encounter (Signed)
Patient called you back.

## 2019-11-05 NOTE — Telephone Encounter (Signed)
Duloxetine making her nauseated- still- on 60 mg daily.   Can stop after 1 week- 30 mg daily- for nerve pain- decrease dose- if needs to, can stop at 1 week, or continue and let me know, if nausea better

## 2019-11-09 DIAGNOSIS — U071 COVID-19: Secondary | ICD-10-CM | POA: Diagnosis not present

## 2019-11-10 ENCOUNTER — Other Ambulatory Visit: Payer: Self-pay | Admitting: Unknown Physician Specialty

## 2019-11-10 ENCOUNTER — Telehealth: Payer: Self-pay | Admitting: Unknown Physician Specialty

## 2019-11-10 DIAGNOSIS — G610 Guillain-Barre syndrome: Secondary | ICD-10-CM

## 2019-11-10 DIAGNOSIS — U071 COVID-19: Secondary | ICD-10-CM

## 2019-11-10 NOTE — Telephone Encounter (Signed)
I connected by phone with Paula Sutton on 11/10/2019 at 10:43 AM to discuss the potential use of a new treatment for mild to moderate COVID-19 viral infection in non-hospitalized patients.  This patient is a 64 y.o. female that meets the FDA criteria for Emergency Use Authorization of COVID monoclonal antibody casirivimab/imdevimab.  Has a (+) direct SARS-CoV-2 viral test result  Has mild or moderate COVID-19   Is NOT hospitalized due to COVID-19  Is within 10 days of symptom onset  Has at least one of the high risk factor(s) for progression to severe COVID-19 and/or hospitalization as defined in EUA.  Specific high risk criteria : Older age (>/= 66 yo)   I have spoken and communicated the following to the patient or parent/caregiver regarding COVID monoclonal antibody treatment:  1. FDA has authorized the emergency use for the treatment of mild to moderate COVID-19 in adults and pediatric patients with positive results of direct SARS-CoV-2 viral testing who are 86 years of age and older weighing at least 40 kg, and who are at high risk for progressing to severe COVID-19 and/or hospitalization.  2. The significant known and potential risks and benefits of COVID monoclonal antibody, and the extent to which such potential risks and benefits are unknown.  3. Information on available alternative treatments and the risks and benefits of those alternatives, including clinical trials.  4. Patients treated with COVID monoclonal antibody should continue to self-isolate and use infection control measures (e.g., wear mask, isolate, social distance, avoid sharing personal items, clean and disinfect "high touch" surfaces, and frequent handwashing) according to CDC guidelines.   5. The patient or parent/caregiver has the option to accept or refuse COVID monoclonal antibody treatment.  After reviewing this information with the patient, The patient agreed to proceed with receiving casirivimab\imdevimab  infusion and will be provided a copy of the Fact sheet prior to receiving the infusion. [name] 11/10/2019 10:43 AM  Sx onset 8/24

## 2019-11-11 ENCOUNTER — Ambulatory Visit (HOSPITAL_COMMUNITY)
Admission: RE | Admit: 2019-11-11 | Discharge: 2019-11-11 | Disposition: A | Discharge status: Home / Self Care | Payer: Medicare Other | Source: Ambulatory Visit | Attending: Pulmonary Disease | Admitting: Pulmonary Disease

## 2019-11-11 DIAGNOSIS — U071 COVID-19: Secondary | ICD-10-CM

## 2019-11-11 DIAGNOSIS — G610 Guillain-Barre syndrome: Secondary | ICD-10-CM | POA: Diagnosis not present

## 2019-11-11 DIAGNOSIS — Z23 Encounter for immunization: Secondary | ICD-10-CM | POA: Insufficient documentation

## 2019-11-11 MED ORDER — EPINEPHRINE 0.3 MG/0.3ML IJ SOAJ: 0.3000 mg | Freq: Once | INTRAMUSCULAR | Status: DC | PRN

## 2019-11-11 MED ORDER — FAMOTIDINE IN NACL 20-0.9 MG/50ML-% IV SOLN: 20.0000 mg | Freq: Once | INTRAVENOUS | Status: DC | PRN

## 2019-11-11 MED ORDER — DIPHENHYDRAMINE HCL 50 MG/ML IJ SOLN: 50.0000 mg | Freq: Once | INTRAMUSCULAR | Status: DC | PRN

## 2019-11-11 MED ORDER — METHYLPREDNISOLONE SODIUM SUCC 125 MG IJ SOLR: 125.0000 mg | Freq: Once | INTRAMUSCULAR | Status: DC | PRN

## 2019-11-11 MED ORDER — ALBUTEROL SULFATE HFA 108 (90 BASE) MCG/ACT IN AERS: 2.0000 | INHALATION_SPRAY | Freq: Once | RESPIRATORY_TRACT | Status: DC | PRN

## 2019-11-11 MED ORDER — SODIUM CHLORIDE 0.9 % IV SOLN
1200.0000 mg | Freq: Once | INTRAVENOUS | Status: AC
  Administered 2019-11-11: 1200 mg via INTRAVENOUS
  Filled 2019-11-11: qty 10

## 2019-11-11 MED ORDER — SODIUM CHLORIDE 0.9 % IV SOLN: INTRAVENOUS | Status: DC | PRN

## 2019-11-11 NOTE — Discharge Instructions (Signed)

## 2019-11-11 NOTE — Progress Notes (Signed)
  Diagnosis: COVID-19  Physician: Wright, MD  Procedure: [facility] Med: casirivimab\imdevimab infusion - Provided patient with casirivimab\imdevimab fact sheet for patients, parents and caregivers prior to infusion.  Complications: No immediate complications noted.  Discharge: Discharged home   Paula Sutton 11/11/2019  

## 2019-11-26 DIAGNOSIS — M9903 Segmental and somatic dysfunction of lumbar region: Secondary | ICD-10-CM | POA: Diagnosis not present

## 2019-11-29 ENCOUNTER — Other Ambulatory Visit: Payer: Self-pay | Admitting: Internal Medicine

## 2019-11-29 DIAGNOSIS — Z1231 Encounter for screening mammogram for malignant neoplasm of breast: Secondary | ICD-10-CM

## 2019-12-12 DIAGNOSIS — M9903 Segmental and somatic dysfunction of lumbar region: Secondary | ICD-10-CM | POA: Diagnosis not present

## 2019-12-12 DIAGNOSIS — M47816 Spondylosis without myelopathy or radiculopathy, lumbar region: Secondary | ICD-10-CM | POA: Diagnosis not present

## 2019-12-18 ENCOUNTER — Ambulatory Visit
Admission: RE | Admit: 2019-12-18 | Discharge: 2019-12-18 | Disposition: A | Discharge status: Home / Self Care | Payer: Medicare Other | Source: Ambulatory Visit | Attending: Internal Medicine | Admitting: Internal Medicine

## 2019-12-18 ENCOUNTER — Other Ambulatory Visit: Payer: Self-pay

## 2019-12-18 DIAGNOSIS — Z1231 Encounter for screening mammogram for malignant neoplasm of breast: Secondary | ICD-10-CM | POA: Diagnosis not present

## 2019-12-18 NOTE — Progress Notes (Signed)
Referring Provider: Celene Squibb, MD Primary Care Physician:  Celene Squibb, MD Primary GI Physician: Dr. Abbey Chatters  Chief Complaint  Patient presents with  . Encopresis    not having any accidents now. Bowels are painful d/t nerves    HPI:   Paula Sutton is a 64 y.o. female presenting today for follow-up and with question regarding pain with BMs.  GI history significant for constipation and GERD.  Colonoscopy 08/09/17 with 3 simple adenomas, diverticulosis in recto-sigmoid, sigmoid, and descending colon, moderate external and internal hemorrhoids. Due for surveillance in 2022. EGD on 08/09/17 with low-grade narrowing Schatzki ring due to GERD; small hiatal hernia; mild gastritis and duodenitis due to NSAIDs.  Last seen in our office in July 2020.  Constipation was well managed on Linzess or MiraLAX daily.  MiraLAX did not give her urgency like Linzess.  No alarm symptoms.  GERD was well controlled on Prevacid once daily and only taking a second dose if she was going to eat something that would trigger GERD symptoms.  Advise she continue her current medications and follow-up in 1 year.  Patient canceled follow-up appointment in July and August 2021.  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  She was treated with IVIG infusions.  At her follow-up with physical medicine rehabilitation on 09/24/2019, she reported mild leakage symptoms prior to GBS and bowels have become a little harder to control since diagnosis.  She also tested positive for COVID on 11/09/19. Received monoclonal antibodies  Today:  Initially lost all bowel and bladder control at the onset of Guillain Barr.  She now has complete control of her bowel movements but continues with urinary incontinence.  Also with pain/stiffness in her legs and numbness from the waist down. Falls frequently on the right side.   Since  [name], she has noticed occasional rectal pain/discomfort that she describes as a "nerve pain" if she passes hard stools.  This occurs maybe once a week to once every 10 days.  Pain resolves within a few minutes and does not return unless she passes another hard stool.  She takes Linzess daily and has 1-2 BMs daily with no rectal pain.  No BRBPR or melena.  If she has an episode of harder stools, she will take a dose of MiraLAX.  Feels constipation may be triggered by eating too much chocolate.   GERD well controlled with Prevacid. No dysphagia. No abdominal pain. No nausea or vomiting.   Past Medical History:  Diagnosis Date  . Arthritis    knee  . COVID-19 10/2019  . Dyslipidemia   . GERD (gastroesophageal reflux disease)   . Guillain Barr syndrome ([place]) 11/2018  . Left anterior fascicular block    PATIENT SAYS THIS SHOWED ON EKG DURING COLONOSCOPY , WAS REFERRED TO CARDIOLOGY DR. Minus Breeding  (SEE EPIC ENCOUNTER 09-2017)       Past Surgical History:  Procedure Laterality Date  . ABDOMINAL HYSTERECTOMY    . BIOPSY  08/09/2017   Procedure: BIOPSY;  Surgeon: Danie Binder, MD;  [facility];  Service: Endoscopy;;  gastric biopsy for h pylori  . CARPAL TUNNEL RELEASE Right 12/07/2018  . CATARACT EXTRACTION, BILATERAL     DID 1 YEAR APART , LAST ONE WAS 12-2017  . COLONOSCOPY WITH ESOPHAGOGASTRODUODENOSCOPY (EGD)  2014   [place] PROPOFOL N/A 08/09/2017  3 simple adenomas, diverticulosis in recto-sigmoid, sigmoid, and descending colin, moderate external and internal hemorrhoids  . ESOPHAGOGASTRODUODENOSCOPY (EGD) WITH PROPOFOL N/A 08/09/2017   Low-grade narrowing Schatzki ring due to GERD; small hiatal hernia; mild gastritis and duodenitis due to NSAIDs; biopsy with gastritis  . KNEE ARTHROSCOPY Right 11/2017   WITH DR Wynelle Link  AT [place] Right   . POLYPECTOMY  08/09/2017   Procedure: POLYPECTOMY;   Surgeon: Danie Binder, MD;  [facility];  Service: Endoscopy;;  cecal polyp hs, transverse colon polyp hs, rectal polyp cs  . TOTAL KNEE REVISION Right 03/01/2018   Procedure: RIGHT TOTAL KNEE REVISION;  Surgeon: Gaynelle Arabian, MD;  Location: [facility];  Service: Orthopedics;  Laterality: Right;  130min    Current Outpatient Medications  Medication Sig Dispense Refill  . acetaminophen (TYLENOL) 325 MG tablet Take 1-2 tablets (325-650 mg total) by mouth every 4 (four) hours as needed for mild pain.    . Alpha-Lipoic Acid 600 MG CAPS Take by mouth daily.    Marland Kitchen CALCIUM PO Take 1 tablet by mouth 2 (two) times daily.     . Cholecalciferol (VITAMIN D3) 125 MCG (5000 UT) CAPS Take 5,000 Units by mouth 2 (two) times daily.    Marland Kitchen estradiol (CLIMARA - DOSED IN MG/24 HR) 0.1 mg/24hr patch Place 0.1 mg onto the skin once a week.     . L-ARGININE PO Take by mouth daily. + l-citrulline    . lansoprazole (PREVACID) 30 MG capsule 1 po 30 mins prior to first meal AND LAST MEAL 60 capsule 11  . linaclotide (LINZESS) 72 MCG capsule Take 1 capsule (72 mcg total) by mouth daily before breakfast. 30 capsule 5  . loratadine (CLARITIN) 10 MG tablet Take 1 tablet (10 mg total) by mouth daily. (Patient taking differently: Take 10 mg by mouth daily as needed. )    . Magnesium Hydroxide (MAGNESIA PO) Take by mouth daily.    . Multiple Vitamin (MULTIVITAMIN) tablet Take 1 tablet by mouth daily.    . Multiple Vitamins-Minerals (ZINC PO) Take by mouth daily.    . Omega-3 Fatty Acids (PRO NUTRIENTS OMEGA 3 PO) Take by mouth daily.    . polyethylene glycol (MIRALAX / GLYCOLAX) 17 g packet Take 17 g by mouth daily as needed.    . TURMERIC PO Take by mouth daily.     No current facility-administered medications for this visit.    Allergies as of 12/19/2019 - Review Complete 12/19/2019  Allergen Reaction Noted  . Sulfa antibiotics Anaphylaxis and Other (See Comments) 01/27/2016  . Oxycodone Hives 12/15/2018     Family History  Problem Relation Age of Onset  . Alcohol abuse Mother   . Colon cancer Neg Hx   . Breast cancer Neg Hx     Social History   Socioeconomic History  . Marital status: Married    Spouse name: Not on file  . Number of children: Not on file  . Years of education: Not on file  . Highest education level: Not on file  Occupational History  . Occupation: Retired     Comment: Glass blower/designer  Tobacco Use  . Smoking status: Former Smoker    Types: Cigarettes    Quit date: 01/27/1971    Years since quitting: 48.9  . Smokeless tobacco: Never Used  Vaping Use  . Vaping Use: Never used  Substance and Sexual Activity  . Alcohol use: Not Currently    Comment:  Occasional: 1-2 times/month  . Drug use: Yes    Frequency: 7.0 times per week    Types: Marijuana  . Sexual activity: Not on file  Other Topics Concern  . Not on file  Social History Narrative   Lives at home with husband.   Social Determinants of Health   Financial Resource Strain:   . Difficulty of Paying Living Expenses: Not on file  Food Insecurity:   . Worried About Charity fundraiser in the Last Year: Not on file  . Ran Out of Food in the Last Year: Not on file  Transportation Needs:   . Lack of Transportation (Medical): Not on file  . Lack of Transportation (Non-Medical): Not on file  Physical Activity:   . Days of Exercise per Week: Not on file  . Minutes of Exercise per Session: Not on file  Stress:   . Feeling of Stress : Not on file  Social Connections:   . Frequency of Communication with Friends and Family: Not on file  . Frequency of Social Gatherings with Friends and Family: Not on file  . Attends Religious Services: Not on file  . Active Member of Clubs or Organizations: Not on file  . Attends Archivist Meetings: Not on file  . Marital Status: Not on file    Review of Systems: Gen: Denies fever, chills, cold or flulike symptoms, presyncope, syncope CV: Denies chest  pain or heart palpitations.. Resp: Denies dyspnea or cough GI: See HPI  Heme: See HPI  Physical Exam: BP 109/64   Pulse 66   Temp (!) 97.1 F (36.2 C) (Oral)   Ht 5\' 3"  (1.6 m)   Wt 159 lb 1.3 oz (72.2 kg)   BMI 28.18 kg/m  General:   Alert and oriented. No distress noted. Pleasant and cooperative.  Walking with a cane.  Very careful with walking and has increased effort when trying to step up on exam table.  She was able to complete this independently. Head:  Normocephalic and atraumatic. Eyes:  Conjuctiva clear without scleral icterus. Heart:  S1, S2 present without murmurs appreciated. Lungs:  Clear to auscultation bilaterally. No wheezes, rales, or rhonchi. No distress.  Abdomen:  +BS, soft, non-tender and non-distended. No rebound or guarding. No HSM or masses noted. Msk:  Symmetrical without gross deformities. Normal posture. Extremities:  Without edema. Neurologic:  Alert and  oriented x4 Psych: Normal mood and affect.

## 2019-12-19 ENCOUNTER — Encounter: Payer: Self-pay | Admitting: Gastroenterology

## 2019-12-19 ENCOUNTER — Ambulatory Visit: Payer: Medicare Other | Admitting: Gastroenterology

## 2019-12-19 VITALS — BP 109/64 | HR 66 | Temp 97.1°F | Ht 63.0 in | Wt 159.1 lb

## 2019-12-19 DIAGNOSIS — K5900 Constipation, unspecified: Secondary | ICD-10-CM

## 2019-12-19 DIAGNOSIS — K219 Gastro-esophageal reflux disease without esophagitis: Secondary | ICD-10-CM

## 2019-12-19 DIAGNOSIS — K6289 Other specified diseases of anus and rectum: Secondary | ICD-10-CM

## 2019-12-19 MED ORDER — LINACLOTIDE 72 MCG PO CAPS
72.0000 ug | ORAL_CAPSULE | Freq: Every day | ORAL | 5 refills | Status: AC
Start: 2019-12-19 — End: ?

## 2019-12-19 NOTE — Assessment & Plan Note (Signed)
Addressed under constipation.

## 2019-12-19 NOTE — Assessment & Plan Note (Signed)
Well controlled on Prevacid 30 mg BID. Advised she continue her current medications and follow-up in 6 months.

## 2019-12-19 NOTE — Patient Instructions (Signed)
Continue taking Linzess 72 mcg daily 30 minutes before first meal.  Be sure to eat plenty of fruits, vegetables, and whole grains to maintain adequate fiber intake.   You may also try adding benefiber or metamucil daily to prevent breakthrough constipation.   Continue to monitor your occasional rectal pain and let me know if this worsens. It may be related to occasional constipation and nerve sensitivity in the setting of Guillain Barr.   Continue Prevacid 30 mg before breakfast and dinner.   We will see you back in 6 months. Please call with questions or concerns prior.   Aliene Altes, PA-C [facility]

## 2019-12-19 NOTE — Assessment & Plan Note (Addendum)
History of constipation well managed with Linzess and MiraLAX as needed.  Occasional hard stool which is associated with rectal pain/discomfort that resolves within a few minutes. No regular rectal pain with BMs. Rectal pain has only started since she was diagnosed with Sharlyn Bologna in September 2020.  She initially lost all bowel and bladder control and had severe pain from the waist down. She is improving and has complete control of her BMs but continues with urinary incontinence, pain/numbness/stiffness in lower extremities, and the occasional rectal pain. Offered rectal exam today, but patient declined. She prefers to monitor for now.  No brbpr or melena. TCS up to date in 08/09/17 with 3 simple adenomas, diverticulosis in recto-sigmoid, sigmoid, and descending colon, moderate external and internal hemorrhoids. Due for surveillance in 2022.   I suspect intermittent rectal pain may be secondary to nerve hypersensitivity in the setting of Guillain Barre and intermittent constipation. Symptoms do not seem classic for anal fissure or hemorrhoids.   Plan:  Continue Linzess 72 mcg daily 30 minutes before first meal.  Increase daily fiber intake with fruits, vegetables, and whole graines.  May also add benefiber or metamucil daily to prevent breakthrough constipation.  Advise she monitor for any worsening and let me know.  Follow-up in 6 months.

## 2019-12-27 DIAGNOSIS — E782 Mixed hyperlipidemia: Secondary | ICD-10-CM | POA: Diagnosis not present

## 2019-12-27 DIAGNOSIS — K5900 Constipation, unspecified: Secondary | ICD-10-CM | POA: Diagnosis not present

## 2019-12-27 DIAGNOSIS — H9201 Otalgia, right ear: Secondary | ICD-10-CM | POA: Diagnosis not present

## 2019-12-28 ENCOUNTER — Encounter: Payer: Self-pay | Admitting: Physical Medicine and Rehabilitation

## 2019-12-28 ENCOUNTER — Other Ambulatory Visit: Payer: Self-pay

## 2019-12-28 ENCOUNTER — Encounter: Payer: Medicare Other | Attending: Physical Medicine & Rehabilitation | Admitting: Physical Medicine and Rehabilitation

## 2019-12-28 VITALS — BP 108/71 | HR 73 | Temp 98.2°F | Ht 63.0 in | Wt 155.8 lb

## 2019-12-28 DIAGNOSIS — R269 Unspecified abnormalities of gait and mobility: Secondary | ICD-10-CM | POA: Diagnosis not present

## 2019-12-28 DIAGNOSIS — R29898 Other symptoms and signs involving the musculoskeletal system: Secondary | ICD-10-CM | POA: Diagnosis not present

## 2019-12-28 DIAGNOSIS — N3281 Overactive bladder: Secondary | ICD-10-CM | POA: Diagnosis not present

## 2019-12-28 DIAGNOSIS — R296 Repeated falls: Secondary | ICD-10-CM | POA: Diagnosis not present

## 2019-12-28 DIAGNOSIS — G610 Guillain-Barre syndrome: Secondary | ICD-10-CM

## 2019-12-28 NOTE — Progress Notes (Signed)
Subjective:    Patient ID: Paula Sutton, female    DOB: 1955-06-20, 64 y.o.   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    Had COVID in August 2021 Taking time to recover and had COVID antibodies/infusion given. At the time.  Also given Prednisone,etc.  And gained 10 lbs in 1 month- came off both.   DT'd couldn't take anything- was off everything-  Neuropathy was bad in feet-  Lyrica tried and didn't work well severe LE edema.   Cymbalta was making her nauseated/sick prior to COVID.   [place]ous[place] - seeing NP- for PCP Martin Majestic to GI- for Guillain Barre- no answers about bowels for Guillain Barre.   Still having "overactive bladder" issues, due ot Guillain barre- having leaking, dripping, etc esp at night. Wearing pads. And sometimes don't make it out of the bed.   When about to have BM, can feel in pelvis- and in anus. Wondering when would improve.  Explained is the signal - which still needs the signal.   On nothing for nerve pain now Taking supplements now- tumeric, LArginine and L-Citrilline, and Alpha Lipoic Acid 600 mg   2 tylenol in afternoon and then 2 at night.  Stayed off Tramadol.   When falls, always falls on R hip- has a whole bottle of Diclofenac.   Getting a lot of "knots' in back and sides- very painful.   Got 1 hour massage- hurt for 3 days.  Walking in neighborhood 25 minutes with RW; then do recumbent bike.   Pain Inventory Average Pain 5 Pain Right Now 5 My pain is burning and aching  In the last 24 hours, has pain interfered with the following? General activity 4 Relation with others 4 Enjoyment of life 4 What TIME of day is your pain at its worst? evening and night Sleep (in general) Fair  Pain is worse with: inactivity and standing Pain improves with: rest, heat/ice, therapy/exercise, pacing activities, medication, TENS and  massage Relief from Meds: 7  Family History  Problem Relation Age of Onset  . Alcohol abuse Mother   . Colon cancer Neg Hx   . Breast cancer Neg Hx    Social History   Socioeconomic History  . Marital status: Married    Spouse name: Not on file  . Number of children: Not on file  . Years of education: Not on file  . Highest education level: Not on file  Occupational History  . Occupation: Retired     Comment: Glass blower/designer  Tobacco Use  . Smoking status: Former Smoker    Types: Cigarettes    Quit date: 01/27/1971    Years since quitting: 48.9  . Smokeless tobacco: Never Used  Vaping Use  . Vaping Use: Never used  Substance and Sexual Activity  . Alcohol use: Not Currently    Comment: Occasional: 1-2 times/month  . Drug use: Yes    Frequency: 7.0 times per week    Types: Marijuana  . Sexual activity: Not on file  Other Topics Concern  . Not on file  Social History Narrative   Lives at home with husband.   Social Determinants of Health   Financial Resource Strain:   . Difficulty of Paying Living Expenses: Not on file  Food Insecurity:   . Worried About Charity fundraiser in the Last Year: Not on file  .  Ran Out of Food in the Last Year: Not on file  Transportation Needs:   . Lack of Transportation (Medical): Not on file  . Lack of Transportation (Non-Medical): Not on file  Physical Activity:   . Days of Exercise per Week: Not on file  . Minutes of Exercise per Session: Not on file  Stress:   . Feeling of Stress : Not on file  Social Connections:   . Frequency of Communication with Friends and Family: Not on file  . Frequency of Social Gatherings with Friends and Family: Not on file  . Attends Religious Services: Not on file  . Active Member of Clubs or Organizations: Not on file  . Attends Archivist Meetings: Not on file  . Marital Status: Not on file   Past Surgical History:  Procedure Laterality Date  . ABDOMINAL HYSTERECTOMY    . BIOPSY   08/09/2017   Procedure: BIOPSY;  Surgeon: Danie Binder, MD;  [facility];  Service: Endoscopy;;  gastric biopsy for h pylori  . CARPAL TUNNEL RELEASE Right 12/07/2018  . CATARACT EXTRACTION, BILATERAL     DID 1 YEAR APART , LAST ONE WAS 12-2017  . COLONOSCOPY WITH ESOPHAGOGASTRODUODENOSCOPY (EGD)  2014   [place] PROPOFOL N/A 08/09/2017   3 simple adenomas, diverticulosis in recto-sigmoid, sigmoid, and descending colin, moderate external and internal hemorrhoids  . ESOPHAGOGASTRODUODENOSCOPY (EGD) WITH PROPOFOL N/A 08/09/2017   Low-grade narrowing Schatzki ring due to GERD; small hiatal hernia; mild gastritis and duodenitis due to NSAIDs; biopsy with gastritis  . KNEE ARTHROSCOPY Right 11/2017   WITH DR Wynelle Link  AT [place] Right   . POLYPECTOMY  08/09/2017   Procedure: POLYPECTOMY;  Surgeon: Danie Binder, MD;  [facility];  Service: Endoscopy;;  cecal polyp hs, transverse colon polyp hs, rectal polyp cs  . TOTAL KNEE REVISION Right 03/01/2018   Procedure: RIGHT TOTAL KNEE REVISION;  Surgeon: Gaynelle Arabian, MD;  [facility]: [facility];  Service: Orthopedics;  Laterality: Right;  110min   Past Surgical History:  Procedure Laterality Date  . ABDOMINAL HYSTERECTOMY    . BIOPSY  08/09/2017   Procedure: BIOPSY;  Surgeon: Danie Binder, MD;  [facility];  Service: Endoscopy;;  gastric biopsy for h pylori  . CARPAL TUNNEL RELEASE Right 12/07/2018  . CATARACT EXTRACTION, BILATERAL     DID 1 YEAR APART , LAST ONE WAS 12-2017  . COLONOSCOPY WITH ESOPHAGOGASTRODUODENOSCOPY (EGD)  2014   [place] PROPOFOL N/A 08/09/2017   3 simple adenomas, diverticulosis in recto-sigmoid, sigmoid, and descending colin, moderate external and internal hemorrhoids  . ESOPHAGOGASTRODUODENOSCOPY (EGD) WITH PROPOFOL N/A 08/09/2017   Low-grade narrowing Schatzki ring due to GERD; small hiatal  hernia; mild gastritis and duodenitis due to NSAIDs; biopsy with gastritis  . KNEE ARTHROSCOPY Right 11/2017   WITH DR Wynelle Link  AT [place] Right   . POLYPECTOMY  08/09/2017   Procedure: POLYPECTOMY;  Surgeon: Danie Binder, MD;  [facility];  Service: Endoscopy;;  cecal polyp hs, transverse colon polyp hs, rectal polyp cs  . TOTAL KNEE REVISION Right 03/01/2018   Procedure: RIGHT TOTAL KNEE REVISION;  Surgeon: Gaynelle Arabian, MD;  [facility];  Service: Orthopedics;  Laterality: Right;  141min   Past Medical History:  Diagnosis Date  . Arthritis    knee  . COVID-19  10/2019  . Dyslipidemia   . GERD (gastroesophageal reflux disease)   . Guillain Barr syndrome ([place]) 11/2018  . Left anterior fascicular block    PATIENT SAYS THIS SHOWED ON EKG DURING COLONOSCOPY , WAS REFERRED TO CARDIOLOGY DR. Jeneen Rinks HOCHREIN  (SEE EPIC ENCOUNTER [phone])      BP 108/71   Pulse 73   Temp 98.2 F (36.8 C)   Ht 5\' 3"  (1.6 m)   Wt 155 lb 12.8 oz (70.7 kg)   SpO2 96%   BMI 27.60 kg/m   Opioid Risk Score:   Fall Risk Score:  `1  Depression screen PHQ 2/9  Depression screen [facility] 2/9 09/24/2019 06/27/2019  Decreased Interest 0 0  Down, Depressed, Hopeless 0 0  PHQ - 2 Score 0 0    Review of Systems  Musculoskeletal:       Hip pain  All other systems reviewed and are negative.      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    1. Will send a referral to Dr Matilde Sprang- however  [facility] is down and not taking referrals- so pt to call back to remind [place] to put in Neuro-Urology referral.   2. Has R trochanteric bursitis- Can take Diclofenac  - suggest 1 tab nightly, before bed. Can help R trochanteric bursitis. Wait on xray- call back for 75 mg diclofenac if need be.    3. Increase Magnesium - to 400 or so milligrams up to 3x/day. Only side effect is loose stools.  Have to take scheduled/regularly.   4. Will wait on PT for stairs until new year to make sure gets covered.    5. Trigger points at next visit if pt interested  6. F/U in 2 months- double visit due to GBS  I spent a total of 45 minutes on appointment- as detailed above.

## 2019-12-28 NOTE — Patient Instructions (Signed)
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    1. Will send a referral to Dr Matilde Sprang- however [facility] is down and not taking referrals- so pt to call back to remind [place] to put in Neuro-Urology referral.   2. Has R trochanteric bursitis- Can take Diclofenac  - suggest 1 tab nightly, before bed. Can help R trochanteric bursitis. Wait on xray. Can call back for 75 mg if need be.   3. Increase Magnesium - to 400 or so milligrams up to 3x/day. Only side effect is loose stools.  Have to take scheduled/regularly.   4. Will wait on PT for stairs until new year to make sure gets covered.    5. Trigger points at next visit if pt interested  6. F/U in 2 months- double visit due to GBS

## 2020-01-01 DIAGNOSIS — Z0001 Encounter for general adult medical examination with abnormal findings: Secondary | ICD-10-CM | POA: Diagnosis not present

## 2020-01-01 DIAGNOSIS — M25551 Pain in right hip: Secondary | ICD-10-CM | POA: Diagnosis not present

## 2020-01-09 DIAGNOSIS — M47816 Spondylosis without myelopathy or radiculopathy, lumbar region: Secondary | ICD-10-CM | POA: Diagnosis not present

## 2020-01-09 DIAGNOSIS — M9903 Segmental and somatic dysfunction of lumbar region: Secondary | ICD-10-CM | POA: Diagnosis not present

## 2020-01-28 DIAGNOSIS — G610 Guillain-Barre syndrome: Secondary | ICD-10-CM | POA: Diagnosis not present

## 2020-01-28 DIAGNOSIS — M25551 Pain in right hip: Secondary | ICD-10-CM | POA: Diagnosis not present

## 2020-01-28 DIAGNOSIS — M7061 Trochanteric bursitis, right hip: Secondary | ICD-10-CM | POA: Diagnosis not present

## 2020-02-06 DIAGNOSIS — M47816 Spondylosis without myelopathy or radiculopathy, lumbar region: Secondary | ICD-10-CM | POA: Diagnosis not present

## 2020-02-06 DIAGNOSIS — M9903 Segmental and somatic dysfunction of lumbar region: Secondary | ICD-10-CM | POA: Diagnosis not present

## 2020-02-22 ENCOUNTER — Encounter: Payer: Self-pay | Admitting: Physical Medicine and Rehabilitation

## 2020-02-22 ENCOUNTER — Other Ambulatory Visit: Payer: Self-pay

## 2020-02-22 ENCOUNTER — Encounter: Payer: Medicare Other | Attending: Physical Medicine & Rehabilitation | Admitting: Physical Medicine and Rehabilitation

## 2020-02-22 VITALS — BP 123/79 | HR 68 | Temp 97.7°F | Ht 63.0 in | Wt 158.4 lb

## 2020-02-22 DIAGNOSIS — R296 Repeated falls: Secondary | ICD-10-CM | POA: Diagnosis not present

## 2020-02-22 DIAGNOSIS — G610 Guillain-Barre syndrome: Secondary | ICD-10-CM

## 2020-02-22 DIAGNOSIS — R269 Unspecified abnormalities of gait and mobility: Secondary | ICD-10-CM | POA: Diagnosis not present

## 2020-02-22 NOTE — Progress Notes (Signed)
Subjective:    Patient ID: Paula Sutton, female    DOB: Feb 07, 1956, 64 y.o.   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    Using Magnesium and  2 other supplements and tumeric- started after last visit- Bladder issues have resolved!  Getting massages- can tell her what areas to work on.  It's not like it was- so doing much better with pain/myofascial pain.   Trochanteric bursitis- is also resolved with tumeric and magnesium-  Taking 1200 mg/day- - not Mg citrate- or oxide, some other type- doesn't need to poop so often.   Still having issues with stairs and balance.   Is driving a little bit now- using cruise control with speed a lot.   Saw family doctor- increased to 75 mg and taking every night now- and if hurts during day- takes tylenol for that.   Does 30 minutes/day- walking.  Some days sleep great- some day, has loud neighbors and doesn't sleep well.    Done with Therapy- until January.  When walks outside, holds onto walker.  If doesn't walk, does floor bike for 30 minutes.   Has had a few near falls- nicked knee last week- 2-3x/since last seen.    Pain Inventory Average Pain 3-5 Pain Right Now 2 My pain is constant, sharp, burning and aching  LOCATION OF PAIN  Joint & muscles  BOWEL Number of stools per week: 7 Oral laxative use Yes  Type of laxative Linzess & Miralax Enema or suppository use No  History of colostomy No  Incontinent No   BLADDER Normal In and out cath, frequency N/A Able to self cath N/A Bladder incontinence Yes  Frequent urination Yes  Leakage with coughing Yes  Difficulty starting stream No  Incomplete bladder emptying No    Mobility use a cane how many minutes can you walk? 30 MINS ability to climb steps?  yes do you drive?  yes Do you have any goals in this area?  yes  Function retired Do you  have any goals in this area?  yes  Neuro/Psych bladder control problems weakness numbness trouble walking  Prior Studies Any changes since last visit?  no  Physicians involved in your care Any changes since last visit?  no   Family History  Problem Relation Age of Onset  . Alcohol abuse Mother   . Colon cancer Neg Hx   . Breast cancer Neg Hx    Social History   Socioeconomic History  . Marital status: Married    Spouse name: Not on file  . Number of children: Not on file  . Years of education: Not on file  . Highest education level: Not on file  Occupational History  . Occupation: Retired     Comment: Glass blower/designer  Tobacco Use  . Smoking status: Former Smoker    Types: Cigarettes    Quit date: 01/27/1971    Years since quitting: 49.1  . Smokeless tobacco: Never Used  Vaping Use  . Vaping Use: Never used  Substance and Sexual Activity  . Alcohol use: Not Currently    Comment: Occasional: 1-2 times/month  . Drug use: Yes    Frequency: 7.0 times per week    Types: Marijuana  . Sexual activity: Not on file  Other Topics Concern  . Not on file  Social History Narrative   Lives at home with  husband.   Social Determinants of Health   Financial Resource Strain: Not on file  Food Insecurity: Not on file  Transportation Needs: Not on file  Physical Activity: Not on file  Stress: Not on file  Social Connections: Not on file   Past Surgical History:  Procedure Laterality Date  . ABDOMINAL HYSTERECTOMY    . BIOPSY  08/09/2017   Procedure: BIOPSY;  Surgeon: Danie Binder, MD;  [facility];  Service: Endoscopy;;  gastric biopsy for h pylori  . CARPAL TUNNEL RELEASE Right 12/07/2018  . CATARACT EXTRACTION, BILATERAL     DID 1 YEAR APART , LAST ONE WAS 12-2017  . COLONOSCOPY WITH ESOPHAGOGASTRODUODENOSCOPY (EGD)  2014   [place] PROPOFOL N/A 08/09/2017   3 simple adenomas, diverticulosis in recto-sigmoid, sigmoid,  and descending colin, moderate external and internal hemorrhoids  . ESOPHAGOGASTRODUODENOSCOPY (EGD) WITH PROPOFOL N/A 08/09/2017   Low-grade narrowing Schatzki ring due to GERD; small hiatal hernia; mild gastritis and duodenitis due to NSAIDs; biopsy with gastritis  . KNEE ARTHROSCOPY Right 11/2017   WITH DR Wynelle Link  AT [place] Right   . POLYPECTOMY  08/09/2017   Procedure: POLYPECTOMY;  Surgeon: Danie Binder, MD;  [facility];  Service: Endoscopy;;  cecal polyp hs, transverse colon polyp hs, rectal polyp cs  . TOTAL KNEE REVISION Right 03/01/2018   Procedure: RIGHT TOTAL KNEE REVISION;  Surgeon: Gaynelle Arabian, MD;  Location: [facility];  Service: Orthopedics;  Laterality: Right;  155min   Past Medical History:  Diagnosis Date  . Arthritis    knee  . COVID-19 10/2019  . Dyslipidemia   . GERD (gastroesophageal reflux disease)   . Guillain Barr syndrome ([place]) 11/2018  . Left anterior fascicular block    PATIENT SAYS THIS SHOWED ON EKG DURING COLONOSCOPY , WAS REFERRED TO CARDIOLOGY DR. Jeneen Rinks HOCHREIN  (SEE EPIC ENCOUNTER [phone])      BP 123/79   Pulse 68   Temp 97.7 F (36.5 C)   Ht 5\' 3"  (1.6 m)   Wt 158 lb 6.4 oz (71.8 kg)   SpO2 98%   BMI 28.06 kg/m   Opioid Risk Score:   Fall Risk Score:  `1  Depression screen PHQ 2/9  Depression screen [facility] 2/9 09/24/2019 06/27/2019  Decreased Interest 0 0  Down, Depressed, Hopeless 0 0  PHQ - 2 Score 0 0   Review of Systems  Genitourinary: Positive for pelvic pain.  Musculoskeletal: Positive for back pain and gait problem.       LEFT KNEE  All other systems reviewed and are negative.      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    1. Needs exercise ball- knees at 90 degrees.  Sit on ball-  Minimum 30 minutes/day- goal 60+ minutes/day.   2. Once it's easy, increase challenges to your balance.   3. Can do crunches as well as using kickball sized ball and throw against  wall- keeping close to wall, and can throw to husband- but keep it between shoulders and waist.    4. Chair yoga youtube videos. To start getting into yoga.   5. F/U- 3 months   I spent a total of 22 minutes on visit- as detailed above.

## 2020-02-22 NOTE — Patient Instructions (Addendum)
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    1. Needs exercise ball- knees at 90 degrees.  Sit on ball-  Minimum 30 minutes/day- goal 60+ minutes/day.   2. Once it's easy, increase challenges to your balance.   3. Can do crunches as well as using kickball sized ball and throw against wall- keeping close to wall, and can throw to husband- but keep it between shoulders and waist.    4. Chair yoga youtube videos. To start getting into yoga.   5. F/U- 3 months- call if any issues

## 2020-03-20 DIAGNOSIS — M9903 Segmental and somatic dysfunction of lumbar region: Secondary | ICD-10-CM | POA: Diagnosis not present

## 2020-03-20 DIAGNOSIS — M47816 Spondylosis without myelopathy or radiculopathy, lumbar region: Secondary | ICD-10-CM | POA: Diagnosis not present

## 2020-04-21 DIAGNOSIS — M47816 Spondylosis without myelopathy or radiculopathy, lumbar region: Secondary | ICD-10-CM | POA: Diagnosis not present

## 2020-04-21 DIAGNOSIS — M9903 Segmental and somatic dysfunction of lumbar region: Secondary | ICD-10-CM | POA: Diagnosis not present

## 2020-05-19 ENCOUNTER — Other Ambulatory Visit: Payer: Self-pay

## 2020-05-19 ENCOUNTER — Encounter: Payer: Self-pay | Admitting: Physical Medicine and Rehabilitation

## 2020-05-19 ENCOUNTER — Encounter: Payer: Medicare Other | Attending: Physical Medicine & Rehabilitation | Admitting: Physical Medicine and Rehabilitation

## 2020-05-19 VITALS — BP 113/76 | HR 77 | Temp 98.2°F | Ht 63.0 in | Wt 161.0 lb

## 2020-05-19 DIAGNOSIS — M7918 Myalgia, other site: Secondary | ICD-10-CM | POA: Insufficient documentation

## 2020-05-19 DIAGNOSIS — M25562 Pain in left knee: Secondary | ICD-10-CM | POA: Insufficient documentation

## 2020-05-19 DIAGNOSIS — R269 Unspecified abnormalities of gait and mobility: Secondary | ICD-10-CM | POA: Diagnosis not present

## 2020-05-19 DIAGNOSIS — G610 Guillain-Barre syndrome: Secondary | ICD-10-CM | POA: Diagnosis not present

## 2020-05-19 MED ORDER — DICLOFENAC SODIUM 75 MG PO TBEC
75.0000 mg | DELAYED_RELEASE_TABLET | Freq: Two times a day (BID) | ORAL | 5 refills | Status: DC
Start: 2020-05-19 — End: 2020-07-07

## 2020-05-19 MED ORDER — CYCLOBENZAPRINE HCL 10 MG PO TABS: 10.0000 mg | ORAL_TABLET | Freq: Three times a day (TID) | ORAL | 5 refills | Status: DC | PRN

## 2020-05-19 NOTE — Progress Notes (Signed)
Subjective:    Patient ID: Paula Sutton, female    DOB: 1956-02-11, 65 y.o.   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    Has been doing [employer]- 2 weeks now.  Going every day so far.  20 minutes on bike and 20 minutes on treadmill.   Chiropractor- had her start new exercises on exercise ball.   Usually sitting on ball 30 minutes per day.  Stabilizer board also - 5 minutes/day.   Hasn't found a new yoga- studio yet.    Can now bend down and pick something off floor.   Still hurts in lower part of back Massage helps a lot. - only gets 1x/month- is $70.  Lidoderm patches help some. Uses at night.     Also hurting on L knee. Hurts medially- esp joint line. Aching/throbbing.   On average , takes Diclofenac 1x/day with 1 extra strength tylenol at night. Because night hurts more.  Doesn't wake up from back pain; wakes up from L knee pain.   Sitting up bike (can't use recumbent) also makes her have pain in R groin.   Taking Mg- 800 mg/day   Pain Inventory Average Pain 3 Pain Right Now 3 My pain is constant, sharp, burning and aching  In the last 24 hours, has pain interfered with the following? General activity 0 Relation with others 0 Enjoyment of life 0 What TIME of day is your pain at its worst? evening and night Sleep (in general) Good  Pain is worse with: walking and sitting Pain improves with: rest, heat/ice, therapy/exercise, medication and TENS Relief from Meds: 7  Family History  Problem Relation Age of Onset   Alcohol abuse Mother    Colon cancer Neg Hx    Breast cancer Neg Hx    Social History   Socioeconomic History   Marital status: Married    Spouse name: Not on file   Number of children: Not on file   Years of education: Not on file   Highest education level: Not on file  Occupational History    Occupation: Retired     Comment: Glass blower/designer  Tobacco Use   Smoking status: Former Smoker    Types: Cigarettes    Quit date: 01/27/1971    Years since quitting: 49.3   Smokeless tobacco: Never Used  Vaping Use   Vaping Use: Never used  Substance and Sexual Activity   Alcohol use: Not Currently    Comment: Occasional: 1-2 times/month   Drug use: Yes    Frequency: 7.0 times per week    Types: Marijuana   Sexual activity: Not on file  Other Topics Concern   Not on file  Social History Narrative   Lives at home with husband.   Social Determinants of Health   Financial Resource Strain: Not on file  Food Insecurity: Not on file  Transportation Needs: Not on file  Physical Activity: Not on file  Stress: Not on file  Social Connections: Not on file   Past Surgical History:  Procedure Laterality Date   ABDOMINAL HYSTERECTOMY     BIOPSY  08/09/2017   Procedure: BIOPSY;  Surgeon: Danie Binder, MD;  [facility];  Service: Endoscopy;;  gastric biopsy for h pylori   CARPAL TUNNEL RELEASE Right 12/07/2018   CATARACT EXTRACTION, BILATERAL     DID 1 YEAR APART ,  LAST ONE WAS 12-2017   COLONOSCOPY WITH ESOPHAGOGASTRODUODENOSCOPY (EGD)  2014   [facility]   COLONOSCOPY WITH PROPOFOL N/A 08/09/2017   3 simple adenomas, diverticulosis in recto-sigmoid, sigmoid, and descending colin, moderate external and internal hemorrhoids   ESOPHAGOGASTRODUODENOSCOPY (EGD) WITH PROPOFOL N/A 08/09/2017   Low-grade narrowing Schatzki ring due to GERD; small hiatal hernia; mild gastritis and duodenitis due to NSAIDs; biopsy with gastritis   KNEE ARTHROSCOPY Right 11/2017   WITH DR [facility]elle [facility]    KNEE SURGERY Right    POLYPECTOMY  08/09/2017   Procedure: POLYPECTOMY;  Surgeon: Danie Binder, MD;  [facility];  Service: Endoscopy;;  cecal polyp hs, transverse colon polyp hs, rectal polyp cs   TOTAL KNEE REVISION Right 03/01/2018    Procedure: RIGHT TOTAL KNEE REVISION;  Surgeon: Gaynelle Arabian, MD;  [facility];  Service: Orthopedics;  Laterality: Right;  161min   Past Surgical History:  Procedure Laterality Date   ABDOMINAL HYSTERECTOMY     BIOPSY  08/09/2017   Procedure: BIOPSY;  Surgeon: Danie Binder, MD;  [facility];  Service: Endoscopy;;  gastric biopsy for h pylori   CARPAL TUNNEL RELEASE Right 12/07/2018   CATARACT EXTRACTION, BILATERAL     DID 1 YEAR APART , LAST ONE WAS 12-2017   COLONOSCOPY WITH ESOPHAGOGASTRODUODENOSCOPY (EGD)  2014   [facility]   COLONOSCOPY WITH PROPOFOL N/A 08/09/2017   3 simple adenomas, diverticulosis in recto-sigmoid, sigmoid, and descending colin, moderate external and internal hemorrhoids   ESOPHAGOGASTRODUODENOSCOPY (EGD) WITH PROPOFOL N/A 08/09/2017   Low-grade narrowing Schatzki ring due to GERD; small hiatal hernia; mild gastritis and duodenitis due to NSAIDs; biopsy with gastritis   KNEE ARTHROSCOPY Right 11/2017   WITH DR Wynelle Link  [facility]    KNEE SURGERY Right    POLYPECTOMY  08/09/2017   Procedure: POLYPECTOMY;  Surgeon: Danie Binder, MD;  [facility];  Service: Endoscopy;;  cecal polyp hs, transverse colon polyp hs, rectal polyp cs   TOTAL KNEE REVISION Right 03/01/2018   Procedure: RIGHT TOTAL KNEE REVISION;  Surgeon: Gaynelle Arabian, MD;  [facility];  Service: Orthopedics;  Laterality: Right;  159min   Past Medical History:  Diagnosis Date   Arthritis    knee   COVID-19 10/2019   Dyslipidemia    GERD (gastroesophageal reflux disease)    Guillain Barr syndrome ([place]) 11/2018   Left anterior fascicular block    PATIENT SAYS THIS SHOWED ON EKG DURING COLONOSCOPY , WAS REFERRED TO CARDIOLOGY DR. Minus Breeding  (SEE EPIC ENCOUNTER 09-2017)      There were no vitals taken for this visit.  Opioid Risk Score:   Fall Risk Score:  `1  Depression screen PHQ 2/9  Depression screen [facility] 2/9  02/22/2020 09/24/2019 06/27/2019  Decreased Interest 0 0 0  Down, Depressed, Hopeless 0 0 0  PHQ - 2 Score 0 0 0   Review of Systems  Constitutional: Negative.   Musculoskeletal: Positive for gait problem.       Pelvis Left knee       Objective:   Physical Exam  Awake, alert, appropriate, alone today, no assistive device, NAD TTP over L medial joint line.  R knee - R knee is turned medially-valgus deformity with lateral turn out of tibia/fibular/i.e. valgus AFTER knee replacement.    using single point cane  Assessment & Plan:   Patient is a 65 yr old with motor  variant of GBS and neuropathic pain.And Overactive bladder Sx'swhichstill having difficulties here for f/u.  Also has L knee pain and low back pain.   1. Suggest youtube for chair yoga- can do in chair OR on exercise ball for something new.  - continue exercise ball with this or without Chair yoga.   2. L knee steroid injection- will get done at next appointment-  -will see if can get in before leaves on trip- next Thursday.   3. Wants to change exercise routine and then if not better, will xray R hip at next visit.   4. Does need refill on Diclofenac- doing well. Will continue for back pain. Will refill 75 mg BID #60 5 refills.   5.  Continue Magnesium 800 mg /day and Tumeric 1000 mg daily;   6. Flexeril/Cyclobenzaprine- 10 mg 3x/day as needed-  Use no more than 4 days/week- but on those days, can use up to 3x in that day.   7. F/U ASAP for L knee injection (no hardware)  And then in 3 months for  F/u.    I spent a total of 30 minutes on visit-  As detailed above.

## 2020-05-19 NOTE — Patient Instructions (Signed)
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  Also has L knee pain and low back pain.   1. Suggest youtube for chair yoga- can do in chair OR on exercise ball for something new.  - continue exercise ball with this or without Chair yoga.   2. L knee steroid injection- will get done at next appointment-  -will see if can get in before leaves on trip- next Thursday.   3. Wants to change exercise routine and then if not better, will xray R hip at next visit.   4. Does need refill on Diclofenac- doing well. Will continue for back pain. Will refill 75 mg BID #60 5 refills.   5.  Continue Magnesium 800 mg /day and Tumeric 1000 mg daily;   6. Flexeril/Cyclobenzaprine- 10 mg 3x/day as needed-  Use no more than 4 days/week- but on those days, can use up to 3x in that day.   7. F/U ASAP for L knee injection (no hardware)  And then in 3 months for  F/u.

## 2020-06-18 ENCOUNTER — Ambulatory Visit: Payer: Medicare Other | Admitting: Gastroenterology

## 2020-07-07 ENCOUNTER — Encounter: Payer: Medicare Other | Attending: Physical Medicine & Rehabilitation | Admitting: Physical Medicine and Rehabilitation

## 2020-07-07 ENCOUNTER — Other Ambulatory Visit: Payer: Self-pay

## 2020-07-07 ENCOUNTER — Encounter: Payer: Self-pay | Admitting: Physical Medicine and Rehabilitation

## 2020-07-07 VITALS — BP 135/83 | HR 78 | Temp 98.6°F | Ht 63.0 in | Wt 161.0 lb

## 2020-07-07 DIAGNOSIS — M25562 Pain in left knee: Secondary | ICD-10-CM | POA: Diagnosis not present

## 2020-07-07 DIAGNOSIS — R269 Unspecified abnormalities of gait and mobility: Secondary | ICD-10-CM | POA: Diagnosis not present

## 2020-07-07 DIAGNOSIS — G610 Guillain-Barre syndrome: Secondary | ICD-10-CM | POA: Diagnosis not present

## 2020-07-07 MED ORDER — DICLOFENAC SODIUM 75 MG PO TBEC: 75.0000 mg | DELAYED_RELEASE_TABLET | Freq: Two times a day (BID) | ORAL | 11 refills | Status: AC

## 2020-07-07 NOTE — Progress Notes (Signed)
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  Also has L knee pain and low back pain.  Here for L knee injection.   Flexeril was "fantastic"- when needs it, it helps SO much! Also stretching more now.   Went Wednesday to [place]- Also back from [place]. Drove the [place] and flying to [place].   Fell last night- had socks on last night- feels fine- same R low back/buttock sore.     Plan: 1. steroid injection was performed at L knee joint L using 1% plain Lidocaine and 40mg  /1cc of Kenalog. This was well tolerated.  Cleaned with betadine x3 and allowed to dry- then alcohol then injected using 27 gauge 1.5 inch needle- no bleeding or complications.    F/U in 3 months for steroid injections of L knee injection.  Lidocaine will kick in 15 minutes- and wear off tonight- the steroid will kick in tomorrow within 24 hours and take up to 72 hours to fully kick in.  2. Discussed sensory changes with voiding- sounds like getting some hot/cold temperature sensation back! Awesome!  3. Con't Flexeril as needed.   4. Continue Diclofenac- as needed.   5. F/U already scheduled in 3 months to f/u on GBS and possibly give L knee injection- will assess.

## 2020-07-07 NOTE — Patient Instructions (Signed)
Plan: 1. steroid injection was performed at L knee joint L using 1% plain Lidocaine and 40mg  /1cc of Kenalog. This was well tolerated.  Cleaned with betadine x3 and allowed to dry- then alcohol then injected using 27 gauge 1.5 inch needle- no bleeding or complications.    F/U in 3 months for steroid injections of L knee injection.  Lidocaine will kick in 15 minutes- and wear off tonight- the steroid will kick in tomorrow within 24 hours and take up to 72 hours to fully kick in.  2. Discussed sensory changes with voiding- sounds like getting some hot/cold temperature sensation back! Awesome!  3. Con't Flexeril as needed.   4. Continue Diclofenac- as needed.   5. F/U already scheduled in 3 months to f/u on GBS and possibly give L knee injection- will assess.

## 2020-08-28 ENCOUNTER — Encounter: Payer: Self-pay | Admitting: Internal Medicine

## 2020-10-06 ENCOUNTER — Encounter: Payer: Self-pay | Admitting: Physical Medicine and Rehabilitation

## 2020-10-06 ENCOUNTER — Encounter
Payer: Medicare Other | Attending: Physical Medicine and Rehabilitation | Admitting: Physical Medicine and Rehabilitation

## 2020-10-06 ENCOUNTER — Other Ambulatory Visit: Payer: Self-pay

## 2020-10-06 VITALS — BP 137/84 | HR 75 | Temp 98.1°F | Ht 63.0 in | Wt 162.8 lb

## 2020-10-06 DIAGNOSIS — K219 Gastro-esophageal reflux disease without esophagitis: Secondary | ICD-10-CM | POA: Diagnosis not present

## 2020-10-06 DIAGNOSIS — M25562 Pain in left knee: Secondary | ICD-10-CM | POA: Insufficient documentation

## 2020-10-06 DIAGNOSIS — G610 Guillain-Barre syndrome: Secondary | ICD-10-CM | POA: Insufficient documentation

## 2020-10-06 MED ORDER — CYCLOBENZAPRINE HCL 10 MG PO TABS: 10.0000 mg | ORAL_TABLET | Freq: Three times a day (TID) | ORAL | 5 refills | Status: DC | PRN

## 2020-10-06 MED ORDER — LANSOPRAZOLE 30 MG PO CPDR
DELAYED_RELEASE_CAPSULE | ORAL | 3 refills | Status: DC
Start: 2020-10-06 — End: 2021-01-05

## 2020-10-06 NOTE — Progress Notes (Signed)
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 And Overactive bladder Sx's .   Also has L knee pain since GBS and low back pain Here for L knee injection and f/u on GBS.    Had another fall- 1x. Around June 1st.  Puppy got in front of her and tripped on the dog.  Slapped solid on L knee and L elbow and they've hurt ever since.  Crawled to recliner to get up- [place] not home.   Still hurts all over L knee- esp medial L knee and distal thigh.   Starting to get neuropathic pain in B/L feet again.  Within the last month.   Still taking Diclofenac 75 mg- BID and Flexeril 1-2x/day- mainly due to L knee/leg.   Having trouble swallowing water- and occ   Won't go down- cannot "gulp"- happens intermittently.  More when laying down/sitting back.  Usually the pill makes it worse- takes them 1 at a time.   Having more bursitis on R hip - Asking if can have injection in next visit.   Exam: Awake, alert, appropriate, using single point cane to walk, NAD L knee TTP as well as medial effusion noted in L knee Also TTP over medial aspect of thigh- vastus medialis.    Plan:  Sit up at 90 degrees or stand to take pills- and don't swallow dry- put fluid in mouth and swallow and then swallow water/liquid again AFTER take sip between pills. Try this and if not enough, then call PCP- might need swallow test.  Will refill her Prevacid for now 30 mg daily since cannot get in with PCP- needs to see them by 4 month mark.  Will schedule R hip bursitis injection with L knee injection at next visit.  Try lidoderm/lidocaine patch 12 hrs on; 12 hrs off- suggest taking at night.  steroid injection was performed at L knee steroid injection using 1% plain Lidocaine and '40mg'$  /1cc of Kenalog. This was well tolerated.  Cleaned with betadine x3 and allowed to dry- then alcohol then injected using 27 gauge 1.5 inch needle- no bleeding or complications.    F/U in 3 months for steroid  injections of R trochanteric bursitis AND L knee.  Lidocaine will kick in 15 minutes- and wear off tonight- the steroid will kick in tomorrow within 24 hours and take up to 72 hours to fully kick in.   I spent a total of 30 minutes on visit- 10 minutes on injection and 20 minutes on f/u as detailed above

## 2020-10-06 NOTE — Patient Instructions (Signed)
Plan:  Sit up at 90 degrees or stand to take pills- and don't swallow dry- put fluid in mouth and swallow and then swallow water/liquid again AFTER take sip between pills. Try this and if not enough, then call PCP- might need swallow test.  Will refill her Prevacid for now 30 mg daily since cannot get in with PCP- needs to see them by 4 month mark.  Will schedule R hip bursitis injection with L knee injection at next visit.  Try lidoderm/lidocaine patch 12 hrs on; 12 hrs off- suggest taking at night.  steroid injection was performed at L knee steroid injection using 1% plain Lidocaine and '40mg'$  /1cc of Kenalog. This was well tolerated.  Cleaned with betadine x3 and allowed to dry- then alcohol then injected using 27 gauge 1.5 inch needle- no bleeding or complications.    F/U in 3 months for steroid injections of R trochanteric bursitis AND L knee.  Lidocaine will kick in 15 minutes- and wear off tonight- the steroid will kick in tomorrow within 24 hours and take up to 72 hours to fully kick in.

## 2020-12-29 DIAGNOSIS — R6889 Other general symptoms and signs: Secondary | ICD-10-CM | POA: Diagnosis not present

## 2020-12-29 DIAGNOSIS — R8290 Unspecified abnormal findings in urine: Secondary | ICD-10-CM | POA: Diagnosis not present

## 2020-12-29 DIAGNOSIS — D649 Anemia, unspecified: Secondary | ICD-10-CM | POA: Diagnosis not present

## 2020-12-29 DIAGNOSIS — E782 Mixed hyperlipidemia: Secondary | ICD-10-CM | POA: Diagnosis not present

## 2020-12-29 DIAGNOSIS — G610 Guillain-Barre syndrome: Secondary | ICD-10-CM | POA: Diagnosis not present

## 2020-12-29 DIAGNOSIS — R682 Dry mouth, unspecified: Secondary | ICD-10-CM | POA: Diagnosis not present

## 2020-12-29 DIAGNOSIS — Z0000 Encounter for general adult medical examination without abnormal findings: Secondary | ICD-10-CM | POA: Diagnosis not present

## 2020-12-29 DIAGNOSIS — Z1159 Encounter for screening for other viral diseases: Secondary | ICD-10-CM | POA: Diagnosis not present

## 2020-12-29 DIAGNOSIS — R1313 Dysphagia, pharyngeal phase: Secondary | ICD-10-CM | POA: Diagnosis not present

## 2020-12-29 DIAGNOSIS — E559 Vitamin D deficiency, unspecified: Secondary | ICD-10-CM | POA: Diagnosis not present

## 2021-01-04 ENCOUNTER — Other Ambulatory Visit: Payer: Self-pay | Admitting: Physical Medicine and Rehabilitation

## 2021-01-05 ENCOUNTER — Other Ambulatory Visit: Payer: Self-pay

## 2021-01-05 ENCOUNTER — Encounter: Payer: Self-pay | Admitting: Physical Medicine and Rehabilitation

## 2021-01-05 ENCOUNTER — Encounter
Payer: Medicare Other | Attending: Physical Medicine and Rehabilitation | Admitting: Physical Medicine and Rehabilitation

## 2021-01-05 VITALS — BP 117/84 | HR 92 | Ht 63.0 in | Wt 162.0 lb

## 2021-01-05 DIAGNOSIS — G610 Guillain-Barre syndrome: Secondary | ICD-10-CM | POA: Diagnosis not present

## 2021-01-05 DIAGNOSIS — M7061 Trochanteric bursitis, right hip: Secondary | ICD-10-CM | POA: Insufficient documentation

## 2021-01-05 DIAGNOSIS — M25562 Pain in left knee: Secondary | ICD-10-CM | POA: Insufficient documentation

## 2021-01-05 DIAGNOSIS — R269 Unspecified abnormalities of gait and mobility: Secondary | ICD-10-CM | POA: Diagnosis not present

## 2021-01-05 NOTE — Progress Notes (Signed)
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 And Overactive bladder Sx's .   Here for f/u on GBS, knee and R hip pain.     Went to dentist- had abscessed tooth.  Really swollen- root canal was planned- no bone to hold root canal.   Needs oral surgery and needs a sign off for surgery- needs from PCP- just had physical.    In December, seeing GI- for swallowing.  Because still difficulty swallowing.    L knee was swollen and also L leg sometimes swollen- and old compression stockings have helped.    Gotten more lax on exercises as well- plans to join [employer]  Plan: Suggest compression socks for LE swelling, not stockings- can get online easier than compression stockings that usually need an Rx.   2. steroid injection was performed at L knee and R trochanteric bursa  using 1% plain Lidocaine and 40mg  /1cc of Kenalog. This was well tolerated.  Cleaned with betadine x3 and allowed to dry- then alcohol then injected using 27 gauge 1.5 inch needle- no bleeding or complications.    F/U in 3 months for steroid injections of R trochanteric bursa and L knee steroid injections.  Lidocaine will kick in 15 minutes- and wear off tonight- the steroid will kick in tomorrow within 24 hours and take up to 72 hours to fully kick in.  3. Speak with PCP about getting "signed off for surgery".    4. Tag me to get results back on GI appointment- that's for swallowing.   5. F/U in 3 months if desired- for R trochanteric bursa and L knee steroid injections.   6. Exercise WITH people- more likely to do it!

## 2021-01-05 NOTE — Patient Instructions (Signed)
Plan: Suggest compression socks for LE swelling, not stockings- can get online easier than compression stockings that usually need an Rx.   2. steroid injection was performed at L knee and R trochanteric bursa  using 1% plain Lidocaine and 40mg  /1cc of Kenalog. This was well tolerated.  Cleaned with betadine x3 and allowed to dry- then alcohol then injected using 27 gauge 1.5 inch needle- no bleeding or complications.    F/U in 3 months for steroid injections of R trochanteric bursa and L knee steroid injections.  Lidocaine will kick in 15 minutes- and wear off tonight- the steroid will kick in tomorrow within 24 hours and take up to 72 hours to fully kick in.  3. Speak with PCP about getting "signed off for surgery".    4. Tag me to get results back on GI appointment- that's for swallowing.   5. F/U in 3 months if desired- for R trochanteric bursa and L knee steroid injections.   6. Exercise WITH people- more likely to do it!

## 2021-01-19 ENCOUNTER — Other Ambulatory Visit: Payer: Self-pay | Admitting: Adult Health Nurse Practitioner

## 2021-01-19 DIAGNOSIS — Z1231 Encounter for screening mammogram for malignant neoplasm of breast: Secondary | ICD-10-CM

## 2021-02-26 ENCOUNTER — Ambulatory Visit
Admission: RE | Admit: 2021-02-26 | Discharge: 2021-02-26 | Disposition: A | Discharge status: Home / Self Care | Payer: Medicare Other | Source: Ambulatory Visit | Attending: Adult Health Nurse Practitioner | Admitting: Adult Health Nurse Practitioner

## 2021-02-26 DIAGNOSIS — Z1231 Encounter for screening mammogram for malignant neoplasm of breast: Secondary | ICD-10-CM | POA: Diagnosis not present

## 2021-04-08 ENCOUNTER — Encounter: Payer: Medicare Other | Admitting: Physical Medicine and Rehabilitation

## 2021-04-17 IMAGING — RF DG FLUORO GUIDE NDL PLC/BX
1 series · 1 of 1 positions shown · non-contrast
Comparison: none

CLINICAL DATA: BILATERAL lower extremity weakness

[Series 1: fluoro_myelogram_singleshot_bw · 0.18mm/px · 1 of 1 slices shown]
[im 1/1]
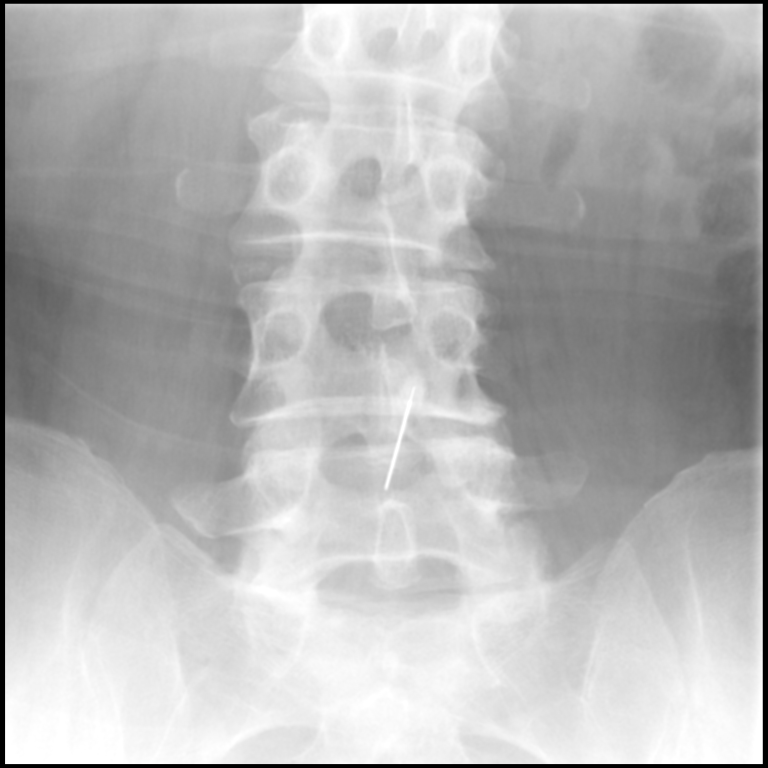

[1 of 1 positions shown; findings below may reference images not displayed]

EXAM:
DIAGNOSTIC LUMBAR PUNCTURE UNDER FLUOROSCOPIC GUIDANCE

FLUOROSCOPY TIME:  Fluoroscopy Time:  0 minutes 12 seconds

Radiation Exposure Index (if provided by the fluoroscopic device):
4.4 mGy

Number of Acquired Spot Images: Single screen capture

PROCEDURE:
Procedure, benefits, and risks were discussed with the patient,
including alternatives.

Patient's questions were answered.

Written informed consent was obtained.

Timeout protocol followed.

Patient placed prone.

L4-L5 disc space was localized under fluoroscopy.

Skin prepped and draped in usual sterile fashion.

Skin and soft tissues anesthetized with 2 mL of 1% lidocaine.

22 gauge needle was advanced into the spinal canal where clear
colorless CSF was encountered with an opening pressure of 15 cm H2O
(LLD position).

12.5 mL of CSF was obtained in 4 tubes for requested analysis.

Procedure tolerated very well by patient without immediate
complication.
IMPRESSION: Fluoroscopic guided lumbar puncture as above.

## 2021-04-17 IMAGING — MR MR CERVICAL SPINE W/O CM
4 of 5 series · 19 of 48 positions shown · non-contrast
Comparison: None.

CLINICAL DATA: Low back pain, cervical pain, weakness in both legs

EXAM:
MRI CERVICAL SPINE WITHOUT CONTRAST
TECHNIQUE: Multiplanar, multisequence MR imaging of the cervical spine was
performed. No intravenous contrast was administered.

[Series 3: T2 · sagittal · 3.0mm · 0.40mm/px · 8 of 15 slices shown (1 of 2)]
[im 1/15]
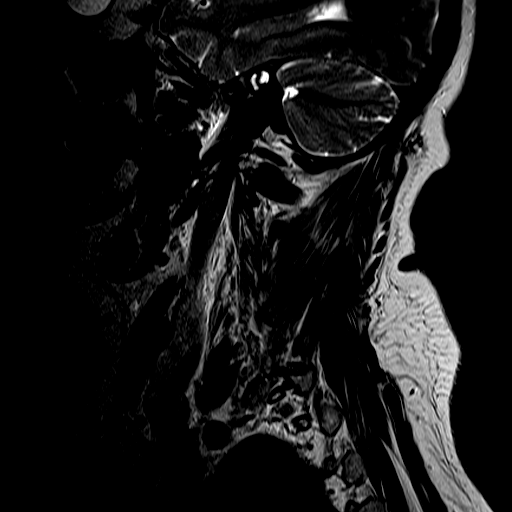
[im 3/15]
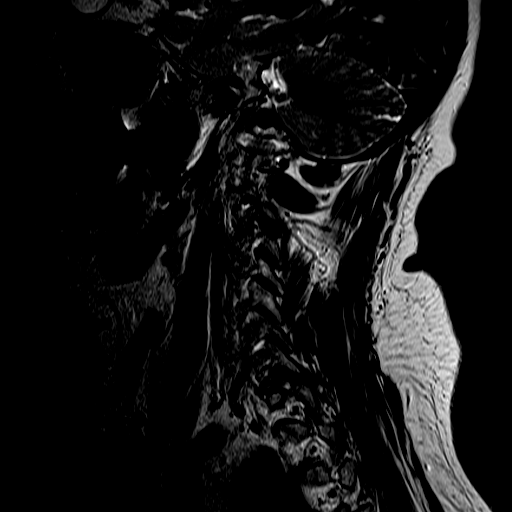
[im 5/15]
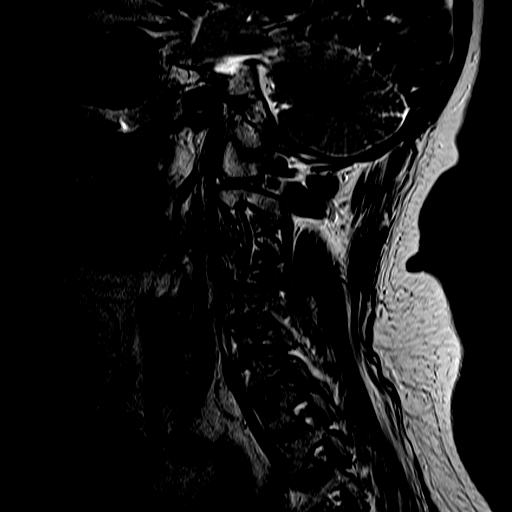
[im 7/15]
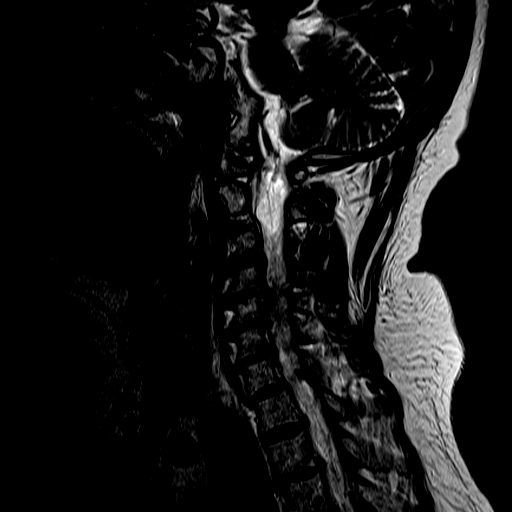
[im 9/15]
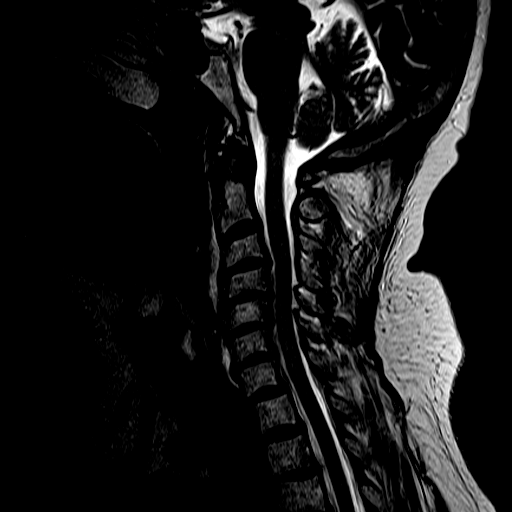
[im 11/15]
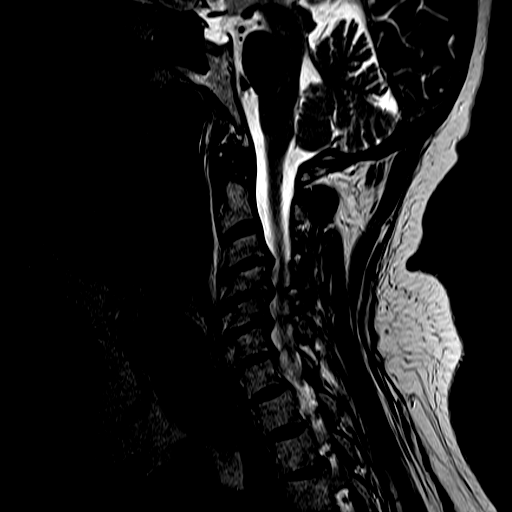
[im 13/15]
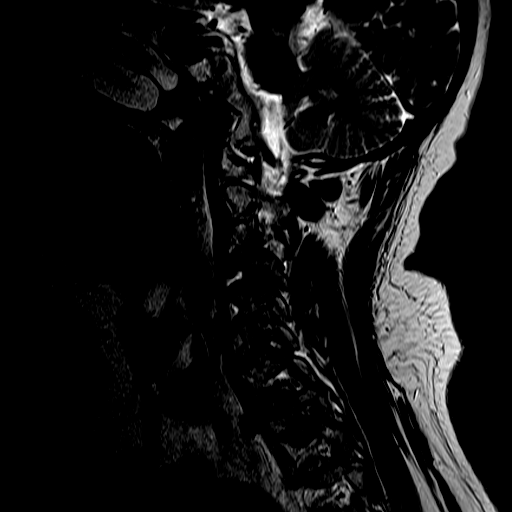
[im 15/15]
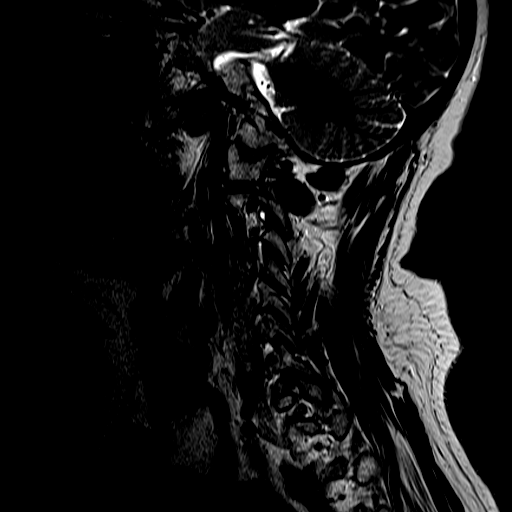

[Series 4: FLAIR · sagittal · 3.0mm · 0.63mm/px · 3 of 15 slices shown]
[im 3/15]
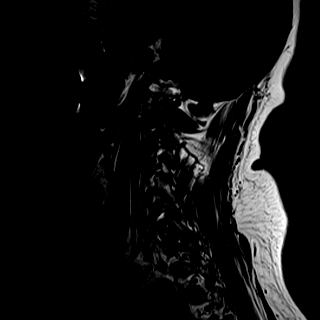
[im 8/15]
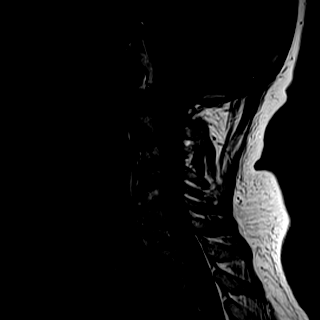
[im 12/15]
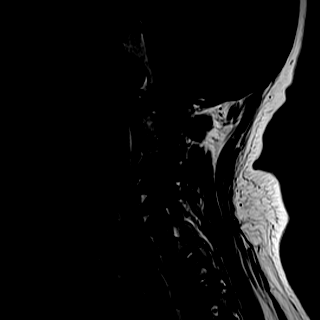

[Series 5: STIR · sagittal · 3.0mm · 0.32mm/px · 3 of 15 slices shown]
[im 3/15]
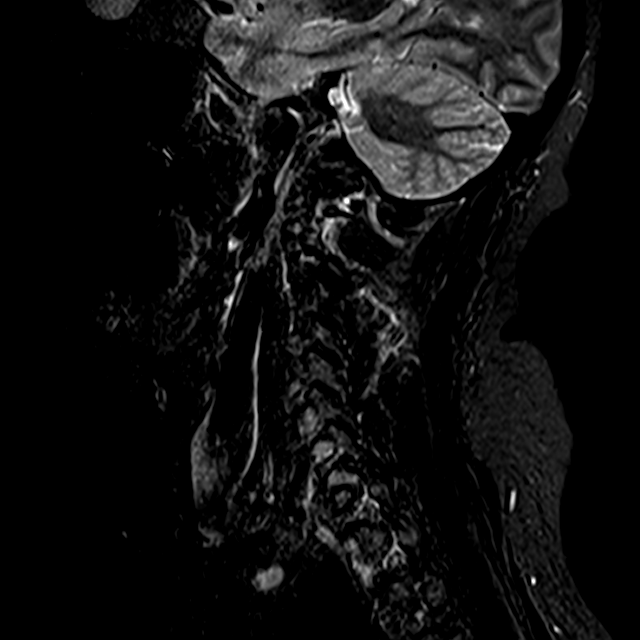
[im 8/15]
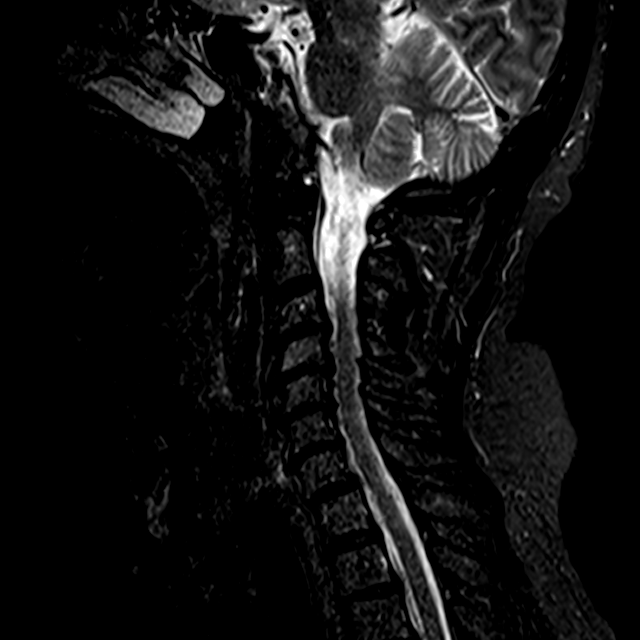
[im 12/15]
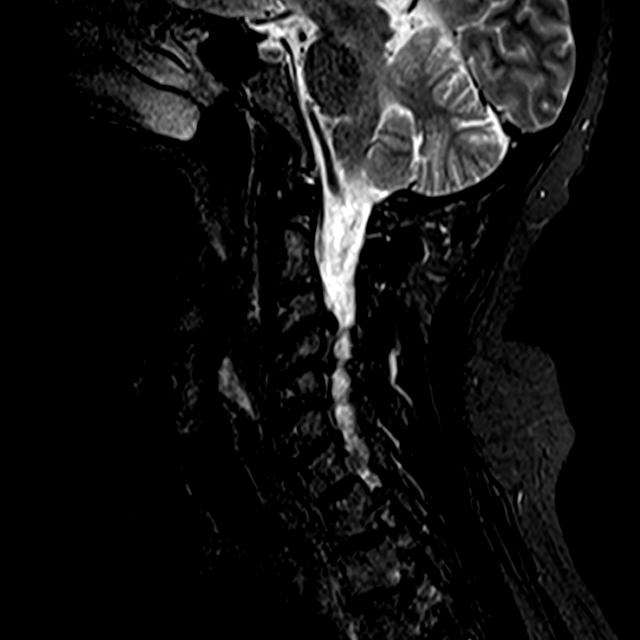

[Series 6: T2 · axial · 3.0mm · 0.33mm/px · z∈[-39,+28]mm · 5 of 27 slices shown (2 of 2)]
[im 1/27]
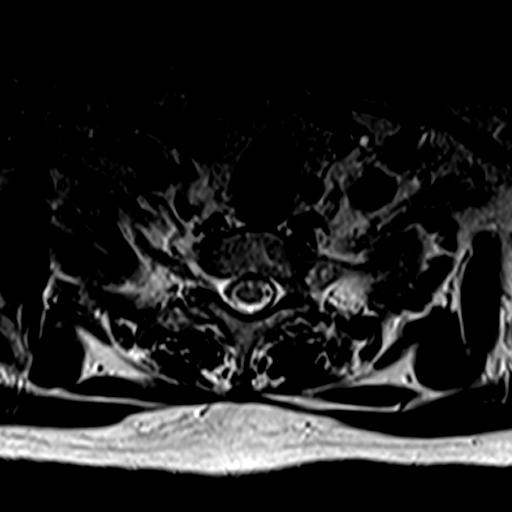
[im 5/27]
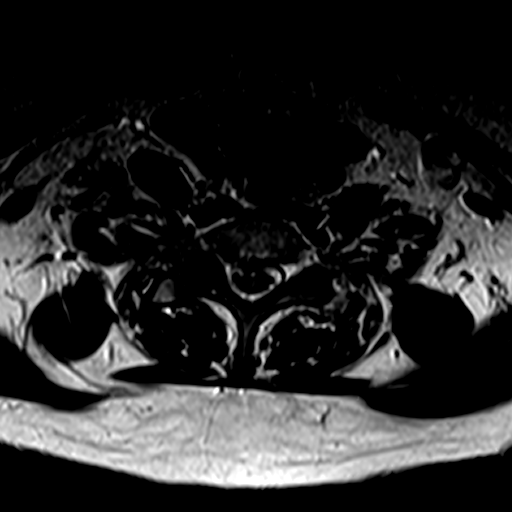
[im 9/27]
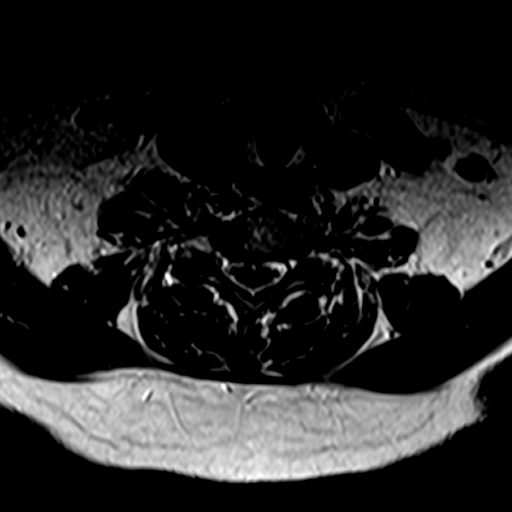
[im 14/27]
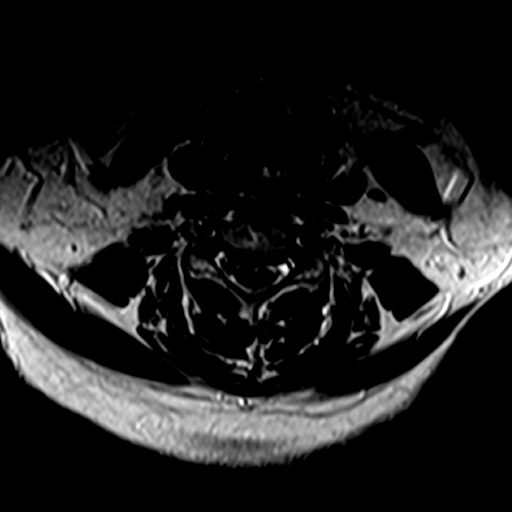
[im 22/27]
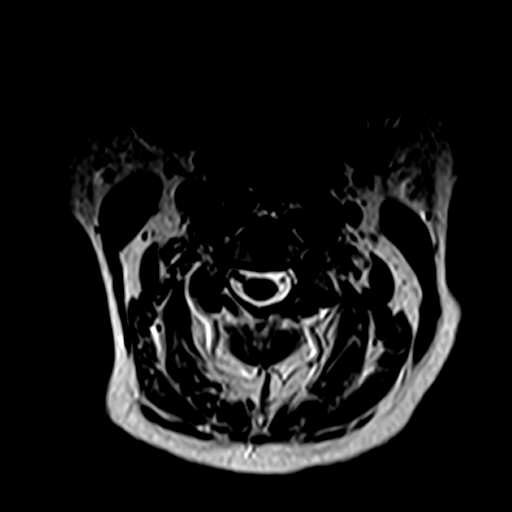

[19 of 48 positions shown; findings below may reference images not displayed]

FINDINGS: Alignment: 2 mm anterolisthesis of C7 on T1.

Vertebrae: No fracture, evidence of discitis, or bone lesion.

Cord: Normal signal and morphology.

Posterior Fossa, vertebral arteries, paraspinal tissues: Posterior
fossa demonstrates no focal abnormality. Vertebral artery flow voids
are maintained. Paraspinal soft tissues are unremarkable.

Disc levels:

Discs: Degenerative disease with disc height loss at C4-5, C5-6 and
C6-7.

C2-3: No significant disc bulge. No neural foraminal stenosis. No
central canal stenosis.

C3-4: Broad-based disc bulge. Left uncovertebral degenerative
changes. Severe left foraminal stenosis. No right foraminal
stenosis. No central canal stenosis.

C4-5: Mild broad-based disc bulge. Bilateral uncovertebral
degenerative changes. Severe bilateral foraminal stenosis. Mild
spinal stenosis.

C5-6: Broad-based disc osteophyte complex. Bilateral uncovertebral
degenerative changes. Mild bilateral foraminal stenosis. No central
canal stenosis.

C6-7: Mild broad-based disc bulge. No neural foraminal stenosis. No
central canal stenosis.

C7-T1: No significant disc bulge. Moderate right foraminal stenosis.
No left foraminal stenosis. No central canal stenosis.

T1-2: Mild broad-based disc bulge.  Mild left foraminal stenosis.

T2-3: Broad-based disc bulge with a small left paracentral disc
protrusion. No foraminal stenosis.
IMPRESSION: 1. Diffuse cervical spine spondylosis as described above.
2.  No acute osseous injury of the cervical spine.

## 2021-06-03 ENCOUNTER — Other Ambulatory Visit: Payer: Self-pay | Admitting: Physical Medicine and Rehabilitation

## 2022-03-02 ENCOUNTER — Other Ambulatory Visit (HOSPITAL_COMMUNITY): Payer: Self-pay | Admitting: Adult Health Nurse Practitioner

## 2022-03-02 DIAGNOSIS — M810 Age-related osteoporosis without current pathological fracture: Secondary | ICD-10-CM

## 2022-03-22 ENCOUNTER — Ambulatory Visit (HOSPITAL_COMMUNITY)
Admission: RE | Admit: 2022-03-22 | Discharge: 2022-03-22 | Disposition: A | Discharge status: Home / Self Care | Payer: Medicare Other | Source: Ambulatory Visit | Attending: Adult Health Nurse Practitioner | Admitting: Adult Health Nurse Practitioner

## 2022-03-22 DIAGNOSIS — M810 Age-related osteoporosis without current pathological fracture: Secondary | ICD-10-CM

## 2022-04-02 ENCOUNTER — Encounter: Payer: Self-pay | Admitting: *Deleted

## 2022-05-17 NOTE — Progress Notes (Unsigned)
Referring Provider: Celene Squibb, MD Primary Care Physician:  Celene Squibb, MD Primary GI Physician: Dr. Abbey Chatters  No chief complaint on file.   HPI:   Paula DONNA is a 67 y.o. female with GI history significant for constipation, adenomatous colon polyps, GERD, Schatzki's ring noted in 2019. She also lost bowel and bladder function when diagnosed with Guillain Barr syndrome in September 2020, but ultimately gained control back over her bowels. She is presenting today for follow-up and to discuss scheduling surveillance colonoscopy.   Last colonoscopy 08/09/17 with 3 simple adenomas, diverticulosis in recto-sigmoid, sigmoid, and descending colon, moderate external and internal hemorrhoids. Recommended 3 year surveillance.   Last seen in our office October 2021. Constipation well controlled on Linzess 72 mcg daily. Reported some rectal pain only if passing a  hard stool. No brbpr or melena. GERD well controlled on Prevacid 30 mg BID. No alarm symptoms. Recommended continuing current medications. Can add benefiber or metamucil to help prevent breakthrough constipation.   Today:   Constipation:   GERD:   History of colon polyps:   Past Medical History:  Diagnosis Date   Arthritis    knee   COVID-19 10/2019   Dyslipidemia    GERD (gastroesophageal reflux disease)    Guillain Barr syndrome ([place]) 11/2018   Left anterior fascicular block    PATIENT SAYS THIS SHOWED ON EKG DURING COLONOSCOPY , WAS REFERRED TO CARDIOLOGY DR. Minus Breeding  (SEE EPIC ENCOUNTER 09-2017)       Past Surgical History:  Procedure Laterality Date   ABDOMINAL HYSTERECTOMY     BIOPSY  08/09/2017   Procedure: BIOPSY;  Surgeon: Danie Binder, MD;  Location: [facility];  Service: Endoscopy;;  gastric biopsy for h pylori   CARPAL TUNNEL RELEASE Right 12/07/2018   CATARACT EXTRACTION, BILATERAL     DID 1 YEAR APART , LAST ONE WAS 12-2017   COLONOSCOPY WITH ESOPHAGOGASTRODUODENOSCOPY (EGD)  2014    [facility]   COLONOSCOPY WITH PROPOFOL N/A 08/09/2017   3 simple adenomas, diverticulosis in recto-sigmoid, sigmoid, and descending colin, moderate external and internal hemorrhoids   ESOPHAGOGASTRODUODENOSCOPY (EGD) WITH PROPOFOL N/A 08/09/2017   Low-grade narrowing Schatzki ring due to GERD; small hiatal hernia; mild gastritis and duodenitis due to NSAIDs; biopsy with gastritis   KNEE ARTHROSCOPY Right 11/2017   WITH DR Wynelle Link  [facility]    KNEE SURGERY Right    POLYPECTOMY  08/09/2017   Procedure: POLYPECTOMY;  Surgeon: Danie Binder, MD;  Location: [facility];  Service: Endoscopy;;  cecal polyp hs, transverse colon polyp hs, rectal polyp cs   TOTAL KNEE REVISION Right 03/01/2018   Procedure: RIGHT TOTAL KNEE REVISION;  Surgeon: Gaynelle Arabian, MD;  Location: [facility];  Service: Orthopedics;  Laterality: Right;  124mn    Current Outpatient Medications  Medication Sig Dispense Refill   acetaminophen (TYLENOL) 325 MG tablet Take 1-2 tablets (325-650 mg total) by mouth every 4 (four) hours as needed for mild pain.     Alpha-Lipoic Acid 600 MG CAPS Take by mouth daily.     amitriptyline (ELAVIL) 50 MG tablet Take 50 mg by mouth at bedtime.     amoxicillin (AMOXIL) 875 MG tablet SMARTSIG:1 Tablet(s) By Mouth Every 12 Hours     CALCIUM PO Take 1 tablet by mouth 2 (two) times daily.      Chelated Magnesium 100 MG TABS Take by mouth.     Cholecalciferol (VITAMIN D3) 125 MCG (  5000 UT) CAPS Take 5,000 Units by mouth 2 (two) times daily.     cyclobenzaprine (FLEXERIL) 10 MG tablet Take 1 tablet (10 mg total) by mouth 3 (three) times daily as needed for muscle spasms. 60 tablet 5   diclofenac (VOLTAREN) 75 MG EC tablet Take 1 tablet (75 mg total) by mouth 2 (two) times daily. 60 tablet 11   estradiol (CLIMARA - DOSED IN MG/24 HR) 0.1 mg/24hr patch Place onto the skin.     fluconazole (DIFLUCAN) 150 MG tablet Take by mouth.     L-ARGININE PO Take by mouth daily. + l-citrulline      lansoprazole (PREVACID) 30 MG capsule 1 BY MOUTH 30 MINS PRIOR TO FIRST MEAL AND LAST MEAL 180 capsule 1   linaclotide (LINZESS) 72 MCG capsule Take 1 capsule (72 mcg total) by mouth daily before breakfast. 30 capsule 5   loratadine (CLARITIN) 10 MG tablet Take 1 tablet (10 mg total) by mouth daily. (Patient taking differently: Take 10 mg by mouth daily as needed.)     MULTIPLE VITAMINS-MINERALS ER PO Take by mouth.     Omega-3 Fatty Acids (FISH OIL) 1000 MG CAPS Take by mouth.     polyethylene glycol (MIRALAX / GLYCOLAX) 17 g packet Take 17 g by mouth daily as needed.     TURMERIC PO Take by mouth daily.     No current facility-administered medications for this visit.    Allergies as of 05/19/2022 - Review Complete 01/05/2021  Allergen Reaction Noted   Sulfa antibiotics Anaphylaxis and Other (See Comments) 01/27/2016   Oxycodone Hives 12/15/2018    Family History  Problem Relation Age of Onset   Alcohol abuse Mother    Colon cancer Neg Hx    Breast cancer Neg Hx     Social History   Socioeconomic History   Marital status: Married    Spouse name: Not on file   Number of children: Not on file   Years of education: Not on file   Highest education level: Not on file  Occupational History   Occupation: Retired     Comment: Glass blower/designer  Tobacco Use   Smoking status: Former    Types: Cigarettes    Quit date: 01/27/1971    Years since quitting: 51.3   Smokeless tobacco: Never  Vaping Use   Vaping Use: Never used  Substance and Sexual Activity   Alcohol use: Not Currently    Comment: Occasional: 1-2 times/month   Drug use: Yes    Frequency: 7.0 times per week    Types: Marijuana   Sexual activity: Not on file  Other Topics Concern   Not on file  Social History Narrative   Lives at home with husband.   Social Determinants of Health   Financial Resource Strain: Not on file  Food Insecurity: Not on file  Transportation Needs: Not on file  Physical Activity:  Not on file  Stress: Not on file  Social Connections: Not on file    Review of Systems: Gen: Denies fever, chills, anorexia. Denies fatigue, weakness, weight loss.  CV: Denies chest pain, palpitations, syncope, peripheral edema, and claudication. Resp: Denies dyspnea at rest, cough, wheezing, coughing up blood, and pleurisy. GI: Denies vomiting blood, jaundice, and fecal incontinence.   Denies dysphagia or odynophagia. Derm: Denies rash, itching, dry skin Psych: Denies depression, anxiety, memory loss, confusion. No homicidal or suicidal ideation.  Heme: Denies bruising, bleeding, and enlarged lymph nodes.  Physical Exam: There were no vitals taken  for this visit. General:   Alert and oriented. No distress noted. Pleasant and cooperative.  Head:  Normocephalic and atraumatic. Eyes:  Conjuctiva clear without scleral icterus. Heart:  S1, S2 present without murmurs appreciated. Lungs:  Clear to auscultation bilaterally. No wheezes, rales, or rhonchi. No distress.  Abdomen:  +BS, soft, non-tender and non-distended. No rebound or guarding. No HSM or masses noted. Msk:  Symmetrical without gross deformities. Normal posture. Extremities:  Without edema. Neurologic:  Alert and  oriented x4 Psych:  Normal mood and affect.    Assessment:     Plan:  ***   Aliene Altes, PA-C [facility] 05/19/2022

## 2022-05-19 ENCOUNTER — Encounter: Payer: Self-pay | Admitting: *Deleted

## 2022-05-19 ENCOUNTER — Ambulatory Visit: Payer: Medicare Other | Admitting: Gastroenterology

## 2022-05-19 ENCOUNTER — Telehealth: Payer: Self-pay | Admitting: *Deleted

## 2022-05-19 ENCOUNTER — Encounter: Payer: Self-pay | Admitting: Gastroenterology

## 2022-05-19 VITALS — BP 138/78 | HR 72 | Temp 97.6°F | Ht 59.0 in | Wt 164.4 lb

## 2022-05-19 DIAGNOSIS — K219 Gastro-esophageal reflux disease without esophagitis: Secondary | ICD-10-CM

## 2022-05-19 DIAGNOSIS — K5900 Constipation, unspecified: Secondary | ICD-10-CM | POA: Diagnosis not present

## 2022-05-19 DIAGNOSIS — Z86010 Personal history of colonic polyps: Secondary | ICD-10-CM | POA: Diagnosis not present

## 2022-05-19 NOTE — Telephone Encounter (Signed)
[employer] PA:  APPROVED Authorization #: [id number] DOS:06/15/22-09/13/22

## 2022-05-19 NOTE — Patient Instructions (Signed)
We will arrange to have a colonoscopy in the near future with Dr. Abbey Chatters.  Continue Linzess 72 mcg daily and MiraLAX nightly for constipation.  Continue Prevacid 30 mg daily for reflux.  You can take an extra dose if needed.  Follow a GERD diet:  Avoid fried, fatty, greasy, spicy, citrus foods. Avoid caffeine and carbonated beverages. Avoid chocolate. Try eating 4-6 small meals a day rather than 3 large meals. Do not eat within 3 hours of laying down. Prop head of bed up on wood or bricks to create a 6 inch incline.  Will follow-up with you in 1 year or sooner if needed.  Do not hesitate to call if you have questions or concerns.  It was great to see you again today!  Aliene Altes, PA-C [facility]

## 2022-06-08 ENCOUNTER — Encounter: Payer: Self-pay | Admitting: *Deleted

## 2022-06-08 ENCOUNTER — Telehealth: Payer: Self-pay | Admitting: *Deleted

## 2022-06-08 NOTE — Telephone Encounter (Signed)
Pt called to reschedule her procedure for 06/14/22 due to her husband being out of town. She has been rescheduled until 07/06/22. New instructions sent via MyChart.

## 2022-06-22 ENCOUNTER — Encounter: Payer: Self-pay | Admitting: *Deleted

## 2022-06-22 ENCOUNTER — Telehealth: Payer: Self-pay | Admitting: *Deleted

## 2022-06-22 NOTE — Telephone Encounter (Signed)
Pt has been rescheduled from 07/06/22 until 07/20/22 at 9:00 am. She says she will be out of town on 07/06/22. New instructions sent via MyChart

## 2022-07-16 ENCOUNTER — Encounter: Payer: Self-pay | Admitting: *Deleted

## 2022-07-16 ENCOUNTER — Telehealth: Payer: Self-pay | Admitting: *Deleted

## 2022-07-16 NOTE — Telephone Encounter (Signed)
Pt called to reschedule her procedure due to not having a ride. She was scheduled for 07/20/22, now she is on for 04/28/22 at 2:30 pm with Dr.Carver. New instructions sent to pt via mychart

## 2022-07-26 ENCOUNTER — Other Ambulatory Visit: Payer: Self-pay | Admitting: *Deleted

## 2022-07-26 MED ORDER — PEG 3350-KCL-NA BICARB-NACL 420 G PO SOLR: 4000.0000 mL | Freq: Once | ORAL | 0 refills | Status: AC

## 2022-07-27 ENCOUNTER — Ambulatory Visit (HOSPITAL_COMMUNITY)
Admission: RE | Admit: 2022-07-27 | Discharge: 2022-07-27 | Disposition: A | Discharge status: Home / Self Care | Payer: Medicare Other | Attending: Internal Medicine | Admitting: Internal Medicine

## 2022-07-27 ENCOUNTER — Ambulatory Visit (HOSPITAL_BASED_OUTPATIENT_CLINIC_OR_DEPARTMENT_OTHER): Payer: Medicare Other | Admitting: Anesthesiology

## 2022-07-27 ENCOUNTER — Encounter (HOSPITAL_COMMUNITY): Payer: Self-pay

## 2022-07-27 ENCOUNTER — Encounter (HOSPITAL_COMMUNITY)
Admission: RE | Disposition: A | Discharge status: Home / Self Care | Payer: Self-pay | Source: Home / Self Care | Attending: Internal Medicine

## 2022-07-27 ENCOUNTER — Other Ambulatory Visit: Payer: Self-pay

## 2022-07-27 ENCOUNTER — Ambulatory Visit (HOSPITAL_COMMUNITY): Payer: Medicare Other | Admitting: Anesthesiology

## 2022-07-27 DIAGNOSIS — R531 Weakness: Secondary | ICD-10-CM | POA: Insufficient documentation

## 2022-07-27 DIAGNOSIS — Z86010 Personal history of colonic polyps: Secondary | ICD-10-CM | POA: Diagnosis not present

## 2022-07-27 DIAGNOSIS — K5730 Diverticulosis of large intestine without perforation or abscess without bleeding: Secondary | ICD-10-CM | POA: Diagnosis not present

## 2022-07-27 DIAGNOSIS — K648 Other hemorrhoids: Secondary | ICD-10-CM

## 2022-07-27 DIAGNOSIS — Z1211 Encounter for screening for malignant neoplasm of colon: Secondary | ICD-10-CM | POA: Diagnosis not present

## 2022-07-27 DIAGNOSIS — G610 Guillain-Barre syndrome: Secondary | ICD-10-CM

## 2022-07-27 DIAGNOSIS — K219 Gastro-esophageal reflux disease without esophagitis: Secondary | ICD-10-CM | POA: Insufficient documentation

## 2022-07-27 DIAGNOSIS — D120 Benign neoplasm of cecum: Secondary | ICD-10-CM | POA: Insufficient documentation

## 2022-07-27 DIAGNOSIS — M1990 Unspecified osteoarthritis, unspecified site: Secondary | ICD-10-CM | POA: Insufficient documentation

## 2022-07-27 DIAGNOSIS — K635 Polyp of colon: Secondary | ICD-10-CM

## 2022-07-27 DIAGNOSIS — Z87891 Personal history of nicotine dependence: Secondary | ICD-10-CM | POA: Diagnosis not present

## 2022-07-27 HISTORY — PX: POLYPECTOMY: SHX5525

## 2022-07-27 HISTORY — PX: COLONOSCOPY WITH PROPOFOL: SHX5780

## 2022-07-27 SURGERY — COLONOSCOPY WITH PROPOFOL
Anesthesia: General

## 2022-07-27 MED ORDER — LACTATED RINGERS IV SOLN
INTRAVENOUS | Status: DC
  Administered 2022-07-27: 1000 mL via INTRAVENOUS

## 2022-07-27 MED ORDER — PROPOFOL 10 MG/ML IV BOLUS
INTRAVENOUS | Status: DC | PRN
  Administered 2022-07-27: 50 mg via INTRAVENOUS
  Administered 2022-07-27: 40 mg via INTRAVENOUS
  Administered 2022-07-27: 20 mg via INTRAVENOUS

## 2022-07-27 MED ORDER — LIDOCAINE HCL (CARDIAC) PF 100 MG/5ML IV SOSY
PREFILLED_SYRINGE | INTRAVENOUS | Status: DC | PRN
  Administered 2022-07-27: 80 mg via INTRAVENOUS

## 2022-07-27 MED ORDER — LIDOCAINE HCL (PF) 2 % IJ SOLN
INTRAMUSCULAR | Status: AC
  Filled 2022-07-27: qty 10

## 2022-07-27 MED ORDER — SIMETHICONE 40 MG/0.6ML PO SUSP
ORAL | Status: AC
  Filled 2022-07-27: qty 30

## 2022-07-27 MED ORDER — PROPOFOL 1000 MG/100ML IV EMUL
INTRAVENOUS | Status: AC
  Filled 2022-07-27: qty 100

## 2022-07-27 MED ORDER — PROPOFOL 500 MG/50ML IV EMUL
INTRAVENOUS | Status: DC | PRN
  Administered 2022-07-27: 125 ug/kg/min via INTRAVENOUS

## 2022-07-27 NOTE — Transfer of Care (Addendum)
Immediate Anesthesia Transfer of Care Note  Patient: Paula Sutton  Procedure(s) Performed: COLONOSCOPY WITH PROPOFOL POLYPECTOMY  Patient Location: PACU  Anesthesia Type:General  Level of Consciousness: drowsy and patient cooperative  Airway & Oxygen Therapy: Patient Spontanous Breathing and Patient connected to nasal cannula oxygen  Post-op Assessment: Report given to RN and Post -op Vital signs reviewed and stable  Post vital signs: Reviewed and stable  Last Vitals:  Vitals Value Taken Time  BP 141/67 0758  Temp 36.4 0758  Pulse 60 0758  Resp 13 0758  SpO2 100% 0758    Last Pain:  Vitals:   07/27/22 0708  TempSrc: Oral  PainSc: 0-No pain      Patients Stated Pain Goal: 7 (07/27/22 0708)  Complications: No notable events documented.

## 2022-07-27 NOTE — Discharge Instructions (Addendum)
  Colonoscopy Discharge Instructions  Read the instructions outlined below and refer to this sheet in the next few weeks. These discharge instructions provide you with general information on caring for yourself after you leave the hospital. Your doctor may also give you specific instructions. While your treatment has been planned according to the most current medical practices available, unavoidable complications occasionally occur.   ACTIVITY You may resume your regular activity, but move at a slower pace for the next 24 hours.  Take frequent rest periods for the next 24 hours.  Walking will help get rid of the air and reduce the bloated feeling in your belly (abdomen).  No driving for 24 hours (because of the medicine (anesthesia) used during the test).   Do not sign any important legal documents or operate any machinery for 24 hours (because of the anesthesia used during the test).  NUTRITION Drink plenty of fluids.  You may resume your normal diet as instructed by your doctor.  Begin with a light meal and progress to your normal diet. Heavy or fried foods are harder to digest and may make you feel sick to your stomach (nauseated).  Avoid alcoholic beverages for 24 hours or as instructed.  MEDICATIONS You may resume your normal medications unless your doctor tells you otherwise.  WHAT YOU CAN EXPECT TODAY Some feelings of bloating in the abdomen.  Passage of more gas than usual.  Spotting of blood in your stool or on the toilet paper.  IF YOU HAD POLYPS REMOVED DURING THE COLONOSCOPY: No aspirin products for 7 days or as instructed.  No alcohol for 7 days or as instructed.  Eat a soft diet for the next 24 hours.  FINDING OUT THE RESULTS OF YOUR TEST Not all test results are available during your visit. If your test results are not back during the visit, make an appointment with your caregiver to find out the results. Do not assume everything is normal if you have not heard from your  caregiver or the medical facility. It is important for you to follow up on all of your test results.  SEEK IMMEDIATE MEDICAL ATTENTION IF: You have more than a spotting of blood in your stool.  Your belly is swollen (abdominal distention).  You are nauseated or vomiting.  You have a temperature over 101.  You have abdominal pain or discomfort that is severe or gets worse throughout the day.   Your colonoscopy revealed 1 polyp(s) which I removed successfully. Await pathology results, my office will contact you. I recommend repeating colonoscopy in 5 years for surveillance purposes.   You also have diverticulosis and internal hemorrhoids. I would recommend increasing fiber in your diet or adding OTC Benefiber/Metamucil. Be sure to drink at least 4 to 6 glasses of water daily. Follow-up with GI office in 1 year   I hope you have a great rest of your week!  Hennie Duos. Marletta Lor, D.O. Gastroenterology and Hepatology [facility]

## 2022-07-27 NOTE — Op Note (Addendum)
[facility] Patient Name: Paula Sutton Procedure Date: 07/27/2022 7:07 AM MRN: [id number] Date of Birth: [birth year] Attending MD: Hennie Duos. Marletta Lor , [place], [id number] CSN: [id number] Age: 67 Admit Type: Outpatient Procedure:                Colonoscopy Indications:              Surveillance: Personal history of adenomatous                            polyps on last colonoscopy 5 years ago Providers:                Hennie Duos. Marletta Lor, DO, Jannett Celestine, RN, Burke Keels, Technician Referring MD:              Medicines:                See the Anesthesia note for documentation of the                            administered medications Complications:            No immediate complications. Estimated Blood Loss:     Estimated blood loss was minimal. Procedure:                Pre-Anesthesia Assessment:                           - The anesthesia plan was to use monitored                            anesthesia care (MAC).                           After obtaining informed consent, the colonoscope                            was passed under direct vision. Throughout the                            procedure, the patient's blood pressure, pulse, and                            oxygen saturations were monitored continuously. The                            PCF-HQ190L ([id number]) scope was introduced through                            the anus and advanced to the the cecum, identified                            by appendiceal orifice and ileocecal valve. The                            colonoscopy was performed without  difficulty. The                            patient tolerated the procedure well. The quality                            of the bowel preparation was evaluated using the                            BBPS [facility] Bowel Preparation Scale) with scores                            of: Right Colon = 3, Transverse Colon = 3 and Left                            Colon = 3  (entire mucosa seen well with no residual                            staining, small fragments of stool or opaque                            liquid). The total BBPS score equals 9. Scope In: 7:39:21 AM Scope Out: 7:54:02 AM Scope Withdrawal Time: 0 hours 8 minutes 37 seconds  Total Procedure Duration: 0 hours 14 minutes 41 seconds  Findings:      Non-bleeding internal hemorrhoids were found during endoscopy.      Multiple large-mouthed and small-mouthed diverticula were found in the       sigmoid colon and descending colon.      An 8 mm polyp was found in the cecum. The polyp was flat. The polyp was       removed with a cold snare. Resection and retrieval were complete.      The exam was otherwise without abnormality. Impression:               - Non-bleeding internal hemorrhoids.                           - Diverticulosis in the sigmoid colon and in the                            descending colon.                           - One 8 mm polyp in the cecum, removed with a cold                            snare. Resected and retrieved.                           - The examination was otherwise normal. Moderate Sedation:      Per Anesthesia Care Recommendation:           - Patient has a contact number available for  emergencies. The signs and symptoms of potential                            delayed complications were discussed with the                            patient. Return to normal activities tomorrow.                            Written discharge instructions were provided to the                            patient.                           - Resume previous diet.                           - Continue present medications.                           - Await pathology results.                           - Repeat colonoscopy in 5 years for surveillance.                           - Return to GI clinic in 1 year. Procedure Code(s):        --- Professional ---                            [phone], Colonoscopy, flexible; with removal of                            tumor(s), polyp(s), or other lesion(s) by snare                            technique Diagnosis Code(s):        --- Professional ---                           Z86.010, Personal history of colonic polyps                           K64.8, Other hemorrhoids                           D12.0, Benign neoplasm of cecum                           K57.30, Diverticulosis of large intestine without                            perforation or abscess without bleeding CPT copyright 2022 American Medical Association. All rights reserved. The codes documented in this report are preliminary and upon coder review may  be revised to meet current compliance requirements. Hennie Duos.  Marletta Lor, DO Hennie Duos. Marletta Lor, DO 07/27/2022 7:58:23 AM This report has been signed electronically. Number of Addenda: 0

## 2022-07-27 NOTE — Anesthesia Preprocedure Evaluation (Addendum)
Anesthesia Evaluation  Patient identified by MRN, date of birth, ID band Patient awake    Reviewed: Allergy & Precautions, H&P , NPO status , Patient's Chart, lab work & pertinent test results, reviewed documented beta blocker date and time   Airway Mallampati: II  TM Distance: >3 FB Neck ROM: Full    Dental  (+) Dental Advisory Given, Teeth Intact   Pulmonary former smoker   Pulmonary exam normal breath sounds clear to auscultation       Cardiovascular METS (bilateral lower extremity weakness and balnace issues): Normal cardiovascular exam+ dysrhythmias  Rhythm:Regular Rate:Normal     Neuro/Psych  Neuromuscular disease (Guillain Barr syndrome ([facility]))  negative psych ROS   GI/Hepatic ,GERD  Medicated and Controlled,,(+)     substance abuse  marijuana use  Endo/Other  negative endocrine ROS    Renal/GU negative Renal ROS  negative genitourinary   Musculoskeletal  (+) Arthritis , Osteoarthritis,    Abdominal   Peds negative pediatric ROS (+)  Hematology negative hematology ROS (+)   Anesthesia Other Findings   Reproductive/Obstetrics negative OB ROS                             Anesthesia Physical Anesthesia Plan  ASA: 3  Anesthesia Plan: General   Post-op Pain Management:    Induction: Intravenous  PONV Risk Score and Plan: Propofol infusion  Airway Management Planned: Nasal Cannula and Natural Airway  Additional Equipment:   Intra-op Plan:   Post-operative Plan:   Informed Consent: I have reviewed the patients History and Physical, chart, labs and discussed the procedure including the risks, benefits and alternatives for the proposed anesthesia with the patient or authorized representative who has indicated his/her understanding and acceptance.     Dental advisory given  Plan Discussed with: CRNA and Surgeon  Anesthesia Plan Comments:        Anesthesia Quick  Evaluation

## 2022-07-27 NOTE — Anesthesia Postprocedure Evaluation (Signed)
Anesthesia Post Note  Patient: Paula Sutton  Procedure(s) Performed: COLONOSCOPY WITH PROPOFOL POLYPECTOMY  Patient location during evaluation: Phase II Anesthesia Type: General Level of consciousness: awake and alert and oriented Pain management: pain level controlled Vital Signs Assessment: post-procedure vital signs reviewed and stable Respiratory status: spontaneous breathing, nonlabored ventilation and respiratory function stable Cardiovascular status: blood pressure returned to baseline and stable Postop Assessment: no apparent nausea or vomiting Anesthetic complications: no  No notable events documented.   Last Vitals:  Vitals:   07/27/22 0708 07/27/22 0758  BP: 133/76 (!) 141/67  Pulse: 61 60  Resp: 12 13  Temp: 36.5 C 36.4 C  SpO2: 99% 100%    Last Pain:  Vitals:   07/27/22 0758  TempSrc: Oral  PainSc: 0-No pain                 Suyash Amory C Jniya Madara

## 2022-07-27 NOTE — H&P (Signed)
Primary Care Physician:  Roe Rutherford, NP Primary Gastroenterologist:  Dr. Marletta Lor  Pre-Procedure History & Physical: HPI:  Paula Sutton is a 67 y.o. female is here for a colonoscopy to be performed for surveillance purposes, personal history of adenomatous colon polyps in 2019  Past Medical History:  Diagnosis Date   Arthritis    knee   COVID-19 10/2019   Dyslipidemia    GERD (gastroesophageal reflux disease)    Guillain Barr syndrome (HCC) 11/2018   Left anterior fascicular block    PATIENT SAYS THIS SHOWED ON EKG DURING COLONOSCOPY , WAS REFERRED TO CARDIOLOGY DR. Rollene Rotunda  (SEE EPIC ENCOUNTER 09-2017)       Past Surgical History:  Procedure Laterality Date   ABDOMINAL HYSTERECTOMY     BIOPSY  08/09/2017   Procedure: BIOPSY;  Surgeon: West Bali, MD;  [facility];  Service: Endoscopy;;  gastric biopsy for h pylori   CARPAL TUNNEL RELEASE Right 12/07/2018   CATARACT EXTRACTION, BILATERAL     DID 1 YEAR APART , LAST ONE WAS 12-2017   COLONOSCOPY WITH ESOPHAGOGASTRODUODENOSCOPY (EGD)  2014   [facility]   COLONOSCOPY WITH PROPOFOL N/A 08/09/2017   3 simple adenomas, diverticulosis in recto-sigmoid, sigmoid, and descending colin, moderate external and internal hemorrhoids   ESOPHAGOGASTRODUODENOSCOPY (EGD) WITH PROPOFOL N/A 08/09/2017   Low-grade narrowing Schatzki ring due to GERD; small hiatal hernia; mild gastritis and duodenitis due to NSAIDs; biopsy with gastritis   KNEE ARTHROSCOPY Right 11/2017   WITH DR Lequita Halt  AT [facility]    KNEE SURGERY Right    POLYPECTOMY  08/09/2017   Procedure: POLYPECTOMY;  Surgeon: West Bali, MD;  [facility];  Service: Endoscopy;;  cecal polyp hs, transverse colon polyp hs, rectal polyp cs   TOTAL KNEE REVISION Right 03/01/2018   Procedure: RIGHT TOTAL KNEE REVISION;  Surgeon: Ollen Gross, MD;  [facility];  Service: Orthopedics;  Laterality: Right;     Prior to  Admission medications   Medication Sig Start Date End Date Taking? Authorizing Provider  acetaminophen (TYLENOL) 500 MG tablet Take 500-1,000 mg by mouth every 6 (six) hours as needed (pain.).   Yes [provider]  amitriptyline (ELAVIL) 50 MG tablet Take 50 mg by mouth at bedtime. 07/28/20  Yes [provider]  amoxicillin (AMOXIL) 500 MG capsule Take 2,000 mg by mouth See admin instructions. Take 4 capsules (2000 mg) by mouth 1 hour prior to dental work 06/04/22 07/31/22 Yes [provider]  Calcium Carb-Cholecalciferol (CALCIUM+D3 PO) Take 2 tablets by mouth in the morning. W/K2   Yes [provider]  cholecalciferol (VITAMIN D3) 25 MCG (1000 UNIT) tablet Take 1,000 Units by mouth in the morning.   Yes [provider]  cyclobenzaprine (FLEXERIL) 10 MG tablet Take 10 mg by mouth at bedtime.   Yes [provider]  diclofenac (VOLTAREN) 75 MG EC tablet Take 1 tablet (75 mg total) by mouth 2 (two) times daily. Patient taking differently: Take 75 mg by mouth at bedtime. 07/07/20  Yes Lovorn, Aundra Millet, MD  doxycycline (VIBRA-TABS) 100 MG tablet Take 100 mg by mouth 2 (two) times daily.   Yes [provider]  estradiol (CLIMARA - DOSED IN MG/24 HR) 0.1 mg/24hr patch Place 0.1 mg onto the skin every Sunday. 01/28/20  Yes [provider]  lansoprazole (PREVACID) 30 MG capsule 1 BY MOUTH 30 MINS PRIOR TO FIRST MEAL AND LAST MEAL Patient taking differently: Take 30  mg by mouth daily before breakfast. 01/05/21  Yes Lovorn, Aundra Millet, MD  linaclotide [facility]) 72 MCG capsule Take 1 capsule (72 mcg total) by mouth daily before breakfast. 12/19/19  Yes Clearance Coots, Kristen S, PA-C  loratadine (CLARITIN) 10 MG tablet Take 10 mg by mouth in the morning.   Yes [provider]  metoprolol succinate (TOPROL-XL) 25 MG 24 hr tablet Take 25 mg by mouth in the morning. 03/02/22  Yes [provider]  MULTIPLE VITAMINS-MINERALS ER PO Take 1 packet  by mouth in the morning. Isotonix Multivitamin Packet   Yes [provider]  polyethylene glycol (MIRALAX / GLYCOLAX) 17 g packet Take 17 g by mouth daily as needed (constipation.).   Yes [provider]  pravastatin (PRAVACHOL) 40 MG tablet Take 20 mg by mouth every other day. At night   Yes [provider]    Allergies as of 05/19/2022 - Review Complete 05/19/2022  Allergen Reaction Noted   Sulfa antibiotics Anaphylaxis and Other (See Comments) 01/27/2016   Oxycodone Hives 12/15/2018    Family History  Problem Relation Age of Onset   Alcohol abuse Mother    Colon cancer Neg Hx    Breast cancer Neg Hx     Social History   Socioeconomic History   Marital status: Married    Spouse name: Not on file   Number of children: Not on file   Years of education: Not on file   Highest education level: Not on file  Occupational History   Occupation: Retired     Comment: Print production planner  Tobacco Use   Smoking status: Former    Types: Cigarettes    Quit date: 01/27/1971    Years since quitting: 51.5   Smokeless tobacco: Never  Vaping Use   Vaping Use: Never used  Substance and Sexual Activity   Alcohol use: Not Currently    Comment: Occasional: 1-2 times/month   Drug use: Yes    Frequency: 7.0 times per week    Types: Marijuana   Sexual activity: Not on file  Other Topics Concern   Not on file  Social History Narrative   Lives at home with husband.   Social Determinants of Health   Financial Resource Strain: Not on file  Food Insecurity: Not on file  Transportation Needs: Not on file  Physical Activity: Not on file  Stress: Not on file  Social Connections: Not on file  Intimate Partner Violence: Not on file    Review of Systems: See HPI, otherwise negative ROS  Physical Exam: Vital signs in last 24 hours: Temp:  [97.7 F (36.5 C)] 97.7 F (36.5 C) (05/14 0708) Pulse Rate:  [61] 61 (05/14 0708) Resp:  [12] 12 (05/14 0708) BP: (133)/(76)  133/76 (05/14 0708) SpO2:  [99 %] 99 % (05/14 0708) Weight:  [72.6 kg] 72.6 kg (05/14 0708)   General:   Alert,  Well-developed, well-nourished, pleasant and cooperative in NAD Head:  Normocephalic and atraumatic. Eyes:  Sclera clear, no icterus.   Conjunctiva pink. Ears:  Normal auditory acuity. Nose:  No deformity, discharge,  or lesions. Msk:  Symmetrical without gross deformities. Normal posture. Extremities:  Without clubbing or edema. Neurologic:  Alert and  oriented x4;  grossly normal neurologically. Skin:  Intact without significant lesions or rashes. Psych:  Alert and cooperative. Normal mood and affect.  Impression/Plan: Paula Sutton is here for a colonoscopy to be performed for surveillance purposes, personal history of adenomatous colon polyps in 2019  The risks  of the procedure including infection, bleed, or perforation as well as benefits, limitations, alternatives and imponderables have been reviewed with the patient. Questions have been answered. All parties agreeable.

## 2022-07-29 LAB — SURGICAL PATHOLOGY

## 2022-08-02 ENCOUNTER — Encounter (HOSPITAL_COMMUNITY): Payer: Self-pay | Admitting: Internal Medicine

## 2023-02-28 ENCOUNTER — Ambulatory Visit: Payer: Medicare Other | Admitting: Neurology

## 2023-05-09 ENCOUNTER — Encounter: Payer: Self-pay | Admitting: Internal Medicine

## 2023-05-24 ENCOUNTER — Encounter: Payer: Self-pay | Admitting: Neurology

## 2023-05-24 ENCOUNTER — Ambulatory Visit: Payer: Medicare Other | Admitting: Neurology

## 2023-05-24 VITALS — BP 121/73 | HR 72 | Ht 59.0 in | Wt 155.0 lb

## 2023-05-24 DIAGNOSIS — N3281 Overactive bladder: Secondary | ICD-10-CM

## 2023-05-24 DIAGNOSIS — G610 Guillain-Barre syndrome: Secondary | ICD-10-CM | POA: Diagnosis not present

## 2023-05-24 DIAGNOSIS — R269 Unspecified abnormalities of gait and mobility: Secondary | ICD-10-CM

## 2023-05-24 DIAGNOSIS — R208 Other disturbances of skin sensation: Secondary | ICD-10-CM | POA: Diagnosis not present

## 2023-05-24 DIAGNOSIS — K5900 Constipation, unspecified: Secondary | ICD-10-CM

## 2023-05-24 MED ORDER — LAMOTRIGINE 25 MG PO TABS: ORAL_TABLET | ORAL | 3 refills | Status: DC

## 2023-05-24 NOTE — Progress Notes (Signed)
 [facility]  PATIENT: Paula Sutton DOB: Jan 10, 1956  REFERRING DOCTOR OR PCP:  Dr. Odis Hollingshead SOURCE: Patient, notes from hospitalization, imaging and lab reports, imaging studies personally reviewed, electrophysiology studies reviewed  _________________________________   HISTORICAL  CHIEF COMPLAINT:  Chief Complaint  Patient presents with   New Patient (Initial Visit)    Pt in 10  Pt states here for neuropathy pain Pt states numbness and tingling in both feet Pt states both feet are cold Pt states 2 falls in last six months Pt states hx of Guillain-Barr syndrome    HISTORY OF PRESENT ILLNESS:  I had the pleasure of seeing patient, Paula Sutton, at [facility] for neurologic consultation regarding her continued symptoms after an episode of Guillain-Barr syndrome in 2020.  She is a 68 year old woman who reports having carpal tunnel syndrome surgery in September 2020.  The next morning se woke up and noted she was unable to move or feel her legs.  Arms were fine.   She also had bowel/bladder  dysfunction.   She was taken to [facility] and was diagnosed wit GBS. She was placed on 5 days of IVig.   She had no immediate improvement.  She  was transferred to [facility] ED and saw Dr. Berline Chough.   She did PT twice daily and began to improve.  She was in Rehab x 2-3 weeks.  Upon discharge she was using a walker at discharge.  She went to a cane about a year later.    Currently, she continues to note weakness in her legs, left > right.  She has spasticity but can not tolerate daytime cyclobenzaprine.   She has rare falls, none since she got inserts for her right foot.   Numbness has improved and is minimal now. However, she has tingling pain and she has RLS at night.   She was unable to tolerate gabapentin   She has bladder urgency and rare incontinence. No incontinence Linzess has helped constipation but she has urgency.    Imaging: MRI of the thoracic spine  12/08/2018 was probably normal.  Patchy changes in the lower spinal cord noted on the STIR images is not clearly confirmed on axial views or sagittal T2 weighted images and is likely artifact.  MRI of the cervical spine 12/08/2018 showed a normal spinal cord.  There was multilevel degenerative change with significant foraminal narrowing to the left at C3-C4 and bilaterally at C4-C5 and to the right at C7-T1.  There was also mild spinal stenosis in the mid cervical spinal cord but there was no spinal cord compression.  MRI of the brain 12/08/2018 just showed minimal age-related chronic microvascular ischemic changes.  No acute findings.  MRI of the lumbar spine 12/08/2018 showed mild multilevel degenerative changes but no spinal stenosis or nerve root compression.   Laboratory:  12/08/2018: There was mild elevated protein in the CSF of 65 (less than 45 is normal).  Other CSF labs were noncontributory.  HIV was negative.  SARS coronavirus 2 was negative  EMG/NCV 01/18/2019 (Dr. 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  There is no evidence of large fiber sensory involvement.  There was diffuse axonal degeneration with reinnervation in the legs but no evidence of demyelination.  REVIEW OF SYSTEMS: Constitutional: No fevers, chills, sweats, or change in appetite Eyes: No visual changes, double vision, eye pain Ear, nose and throat: No hearing loss, ear pain, nasal congestion, sore throat  Cardiovascular: No chest pain, palpitations Respiratory:  No shortness of breath at rest or with exertion.   No wheezes GastrointestinaI: No nausea, vomiting, diarrhea, abdominal pain, fecal incontinence Genitourinary:  No dysuria, urinary retention or frequency.  No nocturia. Musculoskeletal:  No neck pain, back pain Integumentary: No rash, pruritus, skin lesions Neurological: as above Psychiatric: No depression at this time.  No anxiety Endocrine:  No palpitations, diaphoresis, change in appetite, change in weigh or increased thirst Hematologic/Lymphatic:  No anemia, purpura, petechiae. Allergic/Immunologic: No itchy/runny eyes, nasal congestion, recent allergic reactions, rashes  ALLERGIES: Allergies  Allergen Reactions   Sulfa Antibiotics Anaphylaxis and Other (See Comments)    Tongue swelling     Oxycodone Hives    HOME MEDICATIONS:  Current Outpatient Medications:    acetaminophen (TYLENOL) 500 MG tablet, Take 500-1,000 mg by mouth every 6 (six) hours as needed (pain.)., Disp: , Rfl:    amitriptyline (ELAVIL) 50 MG tablet, Take 50 mg by mouth at bedtime., Disp: , Rfl:    Calcium Carb-Cholecalciferol (CALCIUM+D3 PO), Take 2 tablets by mouth in the morning. W/K2, Disp: , Rfl:    cyclobenzaprine (FLEXERIL) 10 MG tablet, Take 10 mg by mouth at bedtime., Disp: , Rfl:    diclofenac (VOLTAREN) 75 MG EC tablet, Take 1 tablet (75 mg total) by mouth 2 (two) times daily. (Patient taking differently: Take 75 mg by mouth at bedtime.), Disp: 60 tablet, Rfl: 11   estradiol (CLIMARA - DOSED IN MG/24 HR) 0.1 mg/24hr patch, Place 0.1 mg onto the skin every Sunday., Disp: , Rfl:    lamoTRIgine (LAMICTAL) 25 MG tablet, Titrate the medication as directed to 2 po bid, Disp: 120 tablet, Rfl: 3   lansoprazole (PREVACID) 30 MG capsule, 1 BY MOUTH 30 MINS PRIOR TO FIRST MEAL AND LAST MEAL (Patient taking differently: Take 30 mg by mouth daily before breakfast.), Disp: 180 capsule, Rfl: 1   linaclotide (LINZESS) 72 MCG capsule, Take 1 capsule (72 mcg total) by mouth daily before breakfast., Disp: 30 capsule, Rfl: 5   metoprolol succinate (TOPROL-XL) 25 MG 24 hr tablet, Take 25 mg by mouth in the morning., Disp: , Rfl:    MULTIPLE VITAMINS-MINERALS ER PO, Take 1 packet by mouth in the morning. Isotonix Multivitamin Packet, Disp: , Rfl:    polyethylene glycol (MIRALAX / GLYCOLAX) 17 g packet, Take 17 g by mouth daily as needed (constipation.)., Disp: , Rfl:     pravastatin (PRAVACHOL) 40 MG tablet, Take 20 mg by mouth every other day. At night, Disp: , Rfl:    cholecalciferol (VITAMIN D3) 25 MCG (1000 UNIT) tablet, Take 1,000 Units by mouth in the morning., Disp: , Rfl:    doxycycline (VIBRA-TABS) 100 MG tablet, Take 100 mg by mouth 2 (two) times daily., Disp: , Rfl:    loratadine (CLARITIN) 10 MG tablet, Take 10 mg by mouth in the morning., Disp: , Rfl:   PAST MEDICAL HISTORY: Past Medical History:  Diagnosis Date   Arthritis    knee   COVID-19 10/2019   Dyslipidemia    GERD (gastroesophageal reflux disease)    Guillain Barr syndrome (HCC) 11/2018   Left anterior fascicular block    PATIENT SAYS THIS SHOWED ON EKG DURING COLONOSCOPY , WAS REFERRED TO CARDIOLOGY DR. Rollene Rotunda  (SEE EPIC ENCOUNTER 09-2017)       PAST SURGICAL HISTORY: Past Surgical History:  Procedure Laterality Date   ABDOMINAL HYSTERECTOMY     BIOPSY  08/09/2017   Procedure: BIOPSY;  Surgeon: Jonette Eva  L, MD;  [facility];  Service: Endoscopy;;  gastric biopsy for h pylori   CARPAL TUNNEL RELEASE Right 12/07/2018   CATARACT EXTRACTION, BILATERAL     DID 1 YEAR APART , LAST ONE WAS 12-2017   COLONOSCOPY WITH ESOPHAGOGASTRODUODENOSCOPY (EGD)  2014   [facility]   COLONOSCOPY WITH PROPOFOL N/A 08/09/2017   3 simple adenomas, diverticulosis in recto-sigmoid, sigmoid, and descending colin, moderate external and internal hemorrhoids   COLONOSCOPY WITH PROPOFOL N/A 07/27/2022   Procedure: COLONOSCOPY WITH PROPOFOL;  Surgeon: Lanelle Bal, DO;  [facility];  Service: Endoscopy;  Laterality: N/A;  11:00 am, asa 2   ESOPHAGOGASTRODUODENOSCOPY (EGD) WITH PROPOFOL N/A 08/09/2017   Low-grade narrowing Schatzki ring due to GERD; small hiatal hernia; mild gastritis and duodenitis due to NSAIDs; biopsy with gastritis   KNEE ARTHROSCOPY Right 11/2017   WITH DR Lequita Halt  AT SUR[facility]ERY [facility]ENTER    KNEE SURGERY Right    POLYPECTOMY  08/09/2017    Procedure: POLYPECTOMY;  Surgeon: West Bali, MD;  [facility];  Service: Endoscopy;;  cecal polyp hs, transverse colon polyp hs, rectal polyp cs   POLYPECTOMY  07/27/2022   Procedure: POLYPECTOMY;  Surgeon: Lanelle Bal, DO;  [facility];  Service: Endoscopy;;   TOTAL KNEE REVISION Right 03/01/2018   Procedure: RIGHT TOTAL KNEE REVISION;  Surgeon: Ollen Gross, MD;  [facility];  Service: Orthopedics;  Laterality: Right;     FAMILY HISTORY: Family History  Problem Relation Age of Onset   Alcohol abuse Mother    Colon cancer Neg Hx    Breast cancer Neg Hx    Neuropathy Neg Hx     SOCIAL HISTORY: Social History   Socioeconomic History   Marital status: Married    Spouse name: Not on file   Number of children: Not on file   Years of education: Not on file   Highest education level: Not on file  Occupational History   Occupation: Retired     Comment: Print production planner  Tobacco Use   Smoking status: Former    Current packs/day: 0.00    Types: Cigarettes    Quit date: 01/27/1971    Years since quitting: 52.3   Smokeless tobacco: Never  Vaping Use   Vaping status: Never Used  Substance and Sexual Activity   Alcohol use: Not Currently    Comment: Occasional: 1-2 times/month   Drug use: Yes    Frequency: 7.0 times per week    Types: Marijuana   Sexual activity: Not on file  Other Topics Concern   Not on file  Social History Narrative   Lives at home with husband.   Retired    Teacher, early years/pre Strain: Low Risk  (09/14/2022)   Received from [employer]   Overall Financial Resource Strain (CARDIA)    Difficulty of Paying Living Expenses: Not hard at all  Food Insecurity: No Food Insecurity (09/14/2022)   Received from [facility]   Hunger Vital Sign    Worried About Running Out of Food in the Last Year: Never true    Ran Out of Food in the Last Year: Never true  Transportation Needs: No  Transportation Needs (09/14/2022)   Received from [facility] - Transportation    Lack of Transportation (Medical): No    Lack of Transportation (Non-Medical): No  Physical Activity: Sufficiently Active (09/14/2022)   Received from [facility]  Health   Exercise Vital Sign    Days of Exercise per Week: 3 days    Minutes of Exercise per Session: 50 min  Stress: No Stress Concern Present (09/14/2022)   Received from [facility] - Occupational Stress Questionnaire    Feeling of Stress : Not at all  Social Connections: Socially Integrated (09/14/2022)   Received from [facility]   Social Network    How would you rate your social network (family, work, friends)?: Good participation with social networks  Intimate Partner Violence: Not At Risk (09/14/2022)   Received from [facility]   HITS    Over the last 12 months how often did your partner physically hurt you?: Never    Over the last 12 months how often did your partner insult you or talk down to you?: Rarely    Over the last 12 months how often did your partner threaten you with physical harm?: Never    Over the last 12 months how often did your partner scream or curse at you?: Rarely       PHYSICAL EXAM  Vitals:   05/24/23 1038  BP: 121/73  Pulse: 72  Weight: 155 lb (70.3 kg)  Height: 4\' 11"  (1.499 m)    Body mass index is 31.31 kg/m.   General: The patient is well-developed and well-nourished and in no acute distress  HEENT:  Head is [place]/AT.  Sclera are anicteric.   Neck: No carotid bruits are noted.  The neck is nontender.  Cardiovascular: The heart has a regular rate and rhythm with a normal S1 and S2. There were no murmurs, gallops or rubs.    Skin: Extremities are without rash or  edema.  Musculoskeletal:  Back is nontender  Neurologic Exam  Mental status: The patient is alert and oriented x 3 at the time of the examination. The patient has apparent normal  recent and remote memory, with an apparently normal attention span and concentration ability.   Speech is normal.  Cranial nerves: Extraocular movements are full.  There is good facial sensation to soft touch bilaterally.Facial strength is normal.  Trapezius and sternocleidomastoid strength is normal. No dysarthria is noted.  The tongue is midline, and the patient has symmetric elevation of the soft palate. No obvious hearing deficits are noted.  Motor:  Muscle bulk is normal.   Tone is normal. Strength is  5 / 5 in arms.  Legs are.  L/R Iliopsoas 3/4 Quads 4+/4- Hamstrings 5/5 Ankle Extension  5/5 Toe extension 5/5  Sensory: Sensory testing is intact to pinprick, soft touch and vibration sensation in arms.  She has moderately  reduced vibration in left toe but only mildly reduced on right.  She has altered PP sensation in right foot but normal on left.   Coordination: Cerebellar testing reveals good finger-nose-finger and heel-to-shin bilaterally.  Gait and station: Station is normal.  Her gait is slightly wide and ataxic and she does not lift the left leg as high as the right.  Romberg is negative.   Reflexes: Deep tendon reflexes are symmetric and NORMAL bilaterally.   Plantar responses are flexor.    DIAGNOSTIC DATA (LABS, IMAGING, TESTING) - I reviewed patient records, labs, notes, testing and imaging myself where available.  Lab Results  Component Value Date   WBC 5.1 12/25/2018   HGB 14.3 12/25/2018   HCT 44.2 12/25/2018   MCV 91.7 12/25/2018   PLT 342 12/25/2018  Component Value Date/Time   NA 136 12/25/2018 0708   K 4.3 12/25/2018 0708   CL 104 12/25/2018 0708   CO2 22 12/25/2018 0708   GLUCOSE 102 (H) 12/25/2018 0708   BUN 21 12/25/2018 0708   CREATININE 0.83 12/25/2018 0708   CALCIUM 9.7 12/25/2018 0708   PROT 8.6 (H) 12/18/2018 0539   ALBUMIN 3.1 (L) 12/18/2018 0539   AST 108 (H) 12/18/2018 0539   ALT 102 (H) 12/18/2018 0539   ALKPHOS 74 12/18/2018 0539    BILITOT 0.8 12/18/2018 0539   GFRNONAA >60 12/25/2018 0708   GFRAA >60 12/25/2018 0708   No results found for: "CHOL", "HDL", "LDLCALC", "LDLDIRECT", "TRIG", "CHOLHDL" No results found for: "HGBA1C" No results found for: "VITAMINB12" Lab Results  Component Value Date   TSH 2.052 12/08/2018       ASSESSMENT AND PLAN  Axonal GBS (Guillain-Barre syndrome) (HCC)  Guillain Barr syndrome (HCC)  Overactive bladder  Dysesthesia  Constipation, unspecified constipation type  Abnormality of gait   In summary, Paula Sutton is a 68 year old woman who was diagnosed with axonal Guillain-Barr syndrome (AMAN) in 2020 after she presented with bilateral leg weakness, numbness and bladder/bowel symptoms.  She received IVIG and slowly improved.  She is currently using a cane but can walk in the room without it.  Her exam was unusual and that she had moderate weakness in the left iliopsoas and right vastus and milder weakness in the right iliopsoas.  However, distal strength was normal.  This is an unusual pattern for sequela of GBS.  Reflexes were present.  Sensory was near normal.  I discussed with her that given the NCV/EMG study, it is likely that she has the axonal variant of GBS though that is fairly rare in the [place] and I cannot rule out a thoracic spine process.  Since she has improved, no further evaluation is needed at this time.  I did discuss with her that if she has new or worsening symptoms that I would want her to let us know as she may require repeat imaging and electrophysiology studies.  To help with her dysesthesia symptoms, I added lamotrigine and she will titrate up to 50 mg twice daily.  She will let us know if she wants to go up to higher dose.  She will return to see me in about 4 months or sooner if there are new or worsening neurologic symptoms.  Thank you for asking me to see this patient.  Please let me know if I can be of further assistance with her or other  patients in the future.    This visit is part of a comprehensive longitudinal care medical relationship regarding the patients primary diagnosis of GBS and sequelae.    Doneisha Ivey A. Epimenio Foot, MD, [facility] 05/24/2023, 1:04 PM Certified in Neurology, Clinical Neurophysiology, Sleep Medicine and Neuroimaging  [facility] [address], [address] [place], [place] [address] [phone]

## 2023-05-24 NOTE — Patient Instructions (Signed)
 Lamotrigine One pill daily x 5 days Then, one pill twice a day x 5 days, Then one pill in the morning and two at night x 5 days Then 2 pills twice daily

## 2023-09-21 ENCOUNTER — Other Ambulatory Visit: Payer: Self-pay | Admitting: Neurology

## 2023-09-21 NOTE — Telephone Encounter (Signed)
 Last seen on 05/24/23 Follow up scheduled on 12/26/23

## 2023-12-26 ENCOUNTER — Encounter: Payer: Self-pay | Admitting: Neurology

## 2023-12-26 ENCOUNTER — Ambulatory Visit: Admitting: Neurology

## 2023-12-26 ENCOUNTER — Telehealth: Payer: Self-pay | Admitting: Neurology

## 2023-12-26 DIAGNOSIS — Z0289 Encounter for other administrative examinations: Secondary | ICD-10-CM

## 2023-12-26 NOTE — Telephone Encounter (Signed)
 Patient cancelled appointment due to unable to come to appointment. Informed patient of show fee of $50, Transferred patient to Billing
